# Patient Record
Sex: Female | Born: 1995 | Hispanic: Yes | Marital: Married | Smoking: Never smoker
Health system: Southern US, Community
[De-identification: ages and names within clinical notes are randomized; demographics above are authoritative.]

## PROBLEM LIST (undated history)

## (undated) ENCOUNTER — Inpatient Hospital Stay (HOSPITAL_COMMUNITY): Payer: Self-pay

## (undated) DIAGNOSIS — K8020 Calculus of gallbladder without cholecystitis without obstruction: Secondary | ICD-10-CM

## (undated) DIAGNOSIS — G43909 Migraine, unspecified, not intractable, without status migrainosus: Secondary | ICD-10-CM

## (undated) DIAGNOSIS — Z789 Other specified health status: Secondary | ICD-10-CM

## (undated) HISTORY — PX: CHOLECYSTECTOMY: SHX55

## (undated) HISTORY — PX: NO PAST SURGERIES: SHX2092

---
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
## 2013-04-02 ENCOUNTER — Emergency Department (HOSPITAL_COMMUNITY)
Admission: EM | Admit: 2013-04-02 | Discharge: 2013-04-02 | Disposition: A | Discharge status: Home / Self Care | Payer: Medicaid Other | Attending: Emergency Medicine | Admitting: Emergency Medicine

## 2013-04-02 ENCOUNTER — Encounter (HOSPITAL_COMMUNITY): Payer: Self-pay | Admitting: Emergency Medicine

## 2013-04-02 DIAGNOSIS — H6690 Otitis media, unspecified, unspecified ear: Secondary | ICD-10-CM | POA: Insufficient documentation

## 2013-04-02 DIAGNOSIS — H6691 Otitis media, unspecified, right ear: Secondary | ICD-10-CM

## 2013-04-02 DIAGNOSIS — R509 Fever, unspecified: Secondary | ICD-10-CM | POA: Insufficient documentation

## 2013-04-02 MED ORDER — AMOXICILLIN 500 MG PO CAPS: 500.0000 mg | ORAL_CAPSULE | Freq: Three times a day (TID) | ORAL | Status: DC

## 2013-04-02 MED ORDER — ACETAMINOPHEN 500 MG PO TABS: 500.0000 mg | ORAL_TABLET | Freq: Four times a day (QID) | ORAL | Status: DC | PRN

## 2013-04-02 NOTE — ED Provider Notes (Signed)
Medical screening examination/treatment/procedure(s) were performed by non-physician practitioner and as supervising physician I was immediately available for consultation/collaboration.  EKG Interpretation   None         Zeniyah Peaster T Sydnie Sigmund, MD 04/02/13 2034 

## 2013-04-02 NOTE — ED Notes (Signed)
Rt ear pain x 3 days had a fever

## 2013-04-02 NOTE — Discharge Instructions (Signed)
Take amoxicillin as directed until gone. Take tylenol as needed for pain. Refer to attached documents for more information. Return to the ED with worsening or concerning symptoms.  °

## 2013-04-02 NOTE — ED Provider Notes (Signed)
CSN: [id number]     Arrival date & time 04/02/13  1443 History  This chart was scribed for non-physician practitioner Emilia BeckKaitlyn Clester Chlebowski, PA-C working with Audree CamelScott T Goldston, MD by Danella Maiersaroline Early, ED Scribe. This patient was seen in room TR08C/TR08C and the patient's care was started at 3:11 PM.   Chief Complaint  Patient presents with  . Otalgia   The history is provided by the patient. No language interpreter was used.   HPI Comments: Lisa Herrera is a 18 y.o. female who presents to the Emergency Department complaining of right otalgia onset 3 days ago. The pain is aching and severe and does not radiate. She reports she started having a fever last night although she did not take her temperature. She has not tried any OTC medications. She reports one of her cousin is sick.   History reviewed. No pertinent past medical history. History reviewed. No pertinent past surgical history. No family history on file. History  Substance Use Topics  . Smoking status: Never Smoker   . Smokeless tobacco: Not on file  . Alcohol Use: No   OB History   Grav Para Term Preterm Abortions TAB SAB Ect Mult Living                 Review of Systems  Constitutional: Positive for fever.  HENT: Positive for ear pain.   All other systems reviewed and are negative.    Allergies  Review of patient's allergies indicates no known allergies.  Home Medications  No current outpatient prescriptions on file. BP 121/75  Pulse 66  Temp(Src) 98.1 F (36.7 C) (Oral)  Resp 18  SpO2 100% Physical Exam  Nursing note and vitals reviewed. Constitutional: She is oriented to person, place, and time. She appears well-developed and well-nourished. No distress.  HENT:  Head: Normocephalic and atraumatic.  Right Ear: Ear canal normal.  Left Ear: Tympanic membrane, external ear and ear canal normal.  Right ear: external ear canal erythematous. Tenderness with palpation of the mastoid and retraction of the auricle.    Eyes: EOM are normal.  Neck: Neck supple. No tracheal deviation present.  Cardiovascular: Normal rate.   Pulmonary/Chest: Effort normal. No respiratory distress.  Musculoskeletal: Normal range of motion.  Neurological: She is alert and oriented to person, place, and time.  Skin: Skin is warm and dry.  Psychiatric: She has a normal mood and affect. Her behavior is normal.    ED Course  Procedures (including critical care time) Medications - No data to display  DIAGNOSTIC STUDIES: Oxygen Saturation is 100% on RA, normal by my interpretation.    COORDINATION OF CARE: 4:18 PM- Discussed treatment plan with pt which includes discharge home with antibiotics. Pt agrees to plan.    Labs Review Labs Reviewed - No data to display Imaging Review No results found.  EKG Interpretation   None       MDM   1. Otitis media, right     5:41 PM Patient likely has otitis media. Patient will be discharged with amoxicillin and tylenol. Vitals stable and patient afebrile. Patient instructed to return with worsening or concerning symptoms.   I personally performed the services described in this documentation, which was scribed in my presence. The recorded information has been reviewed and is accurate.    Emilia BeckKaitlyn Janazia Schreier, PA-C 04/02/13 1742

## 2013-07-15 ENCOUNTER — Encounter (HOSPITAL_COMMUNITY): Payer: Self-pay | Admitting: Emergency Medicine

## 2013-07-15 ENCOUNTER — Emergency Department (HOSPITAL_COMMUNITY)
Admission: EM | Admit: 2013-07-15 | Discharge: 2013-07-16 | Disposition: A | Discharge status: Home / Self Care | Payer: Medicaid Other | Attending: Emergency Medicine | Admitting: Emergency Medicine

## 2013-07-15 DIAGNOSIS — R197 Diarrhea, unspecified: Secondary | ICD-10-CM | POA: Insufficient documentation

## 2013-07-15 DIAGNOSIS — R1010 Upper abdominal pain, unspecified: Secondary | ICD-10-CM

## 2013-07-15 DIAGNOSIS — R1013 Epigastric pain: Secondary | ICD-10-CM | POA: Insufficient documentation

## 2013-07-15 DIAGNOSIS — R1012 Left upper quadrant pain: Secondary | ICD-10-CM | POA: Insufficient documentation

## 2013-07-15 LAB — CBC WITH DIFFERENTIAL/PLATELET
BASOS PCT: 0 % (ref 0–1)
Basophils Absolute: 0 10*3/uL (ref 0.0–0.1)
EOS ABS: 0.1 10*3/uL (ref 0.0–0.7)
Eosinophils Relative: 1 % (ref 0–5)
HEMATOCRIT: 40.5 % (ref 36.0–46.0)
HEMOGLOBIN: 13 g/dL (ref 12.0–15.0)
LYMPHS ABS: 2.5 10*3/uL (ref 0.7–4.0)
Lymphocytes Relative: 27 % (ref 12–46)
MCH: 28.9 pg (ref 26.0–34.0)
MCHC: 32.1 g/dL (ref 30.0–36.0)
MCV: 90 fL (ref 78.0–100.0)
MONO ABS: 0.5 10*3/uL (ref 0.1–1.0)
MONOS PCT: 5 % (ref 3–12)
NEUTROS PCT: 68 % (ref 43–77)
Neutro Abs: 6.4 10*3/uL (ref 1.7–7.7)
Platelets: 278 10*3/uL (ref 150–400)
RBC: 4.5 MIL/uL (ref 3.87–5.11)
RDW: 12.8 % (ref 11.5–15.5)
WBC: 9.4 10*3/uL (ref 4.0–10.5)

## 2013-07-15 NOTE — ED Provider Notes (Signed)
CSN: [id number]     Arrival date & time 07/15/13  2114 History   First MD Initiated Contact with Patient 07/15/13 2303     Chief Complaint  Patient presents with  . Abdominal Pain     (Consider location/radiation/quality/duration/timing/severity/associated sxs/prior Treatment) HPI 18 year old female presents to emergency department via EMS from home with complaint of 2 days of upper abdominal pain, and one week of loose stools.  Patient reports diarrhea, has been 4-5 loose stools a day.  She has had some mild cramping associated with it.  No blood or mucus noted in stools.  No nausea or vomiting.  Patient finished her last menstrual cycle today.  No sick contacts, no travel, no unusual foods.  Patient reports she was diagnosed with gastritis about 6 months ago, when she had an episode of nausea, vomiting, and diarrhea.  She does not remember what medications she was prescribed at that time.  Patient denies any medical history.  She has not taken any over-the-counter medications prior to arrival.  No fevers or chills.  Patient presents tonight as pain got worse. History reviewed. No pertinent past medical history. History reviewed. No pertinent past surgical history. History reviewed. No pertinent family history. History  Substance Use Topics  . Smoking status: Never Smoker   . Smokeless tobacco: Not on file  . Alcohol Use: No   OB History   Grav Para Term Preterm Abortions TAB SAB Ect Mult Living                 Review of Systems  See History of Present Illness; otherwise all other systems are reviewed and negative   Allergies  Review of patient's allergies indicates no known allergies.  Home Medications   Prior to Admission medications   Not on File   BP 137/69  Pulse 75  Temp(Src) 97.8 F (36.6 C) (Oral)  Resp 16  Ht 5' (1.524 m)  Wt 145 lb 12.8 oz (66.134 kg)  BMI 28.47 kg/m2  SpO2 100%  LMP 07/14/2013 Physical Exam  Nursing note and vitals  reviewed. Constitutional: She is oriented to person, place, and time. She appears well-developed and well-nourished. No distress (tearful).  HENT:  Head: Normocephalic and atraumatic.  Nose: Nose normal.  Mouth/Throat: Oropharynx is clear and moist.  Eyes: Conjunctivae and EOM are normal. Pupils are equal, round, and reactive to light.  Neck: Normal range of motion. Neck supple. No JVD present. No tracheal deviation present. No thyromegaly present.  Cardiovascular: Normal rate, regular rhythm, normal heart sounds and intact distal pulses.  Exam reveals no gallop and no friction rub.   No murmur heard. Pulmonary/Chest: Effort normal and breath sounds normal. No stridor. No respiratory distress. She has no wheezes. She has no rales. She exhibits no tenderness.  Abdominal: Soft. She exhibits no distension and no mass. There is tenderness (patient has tenderness to palpation in the epigastrium and left upper quadrant.  No rebound or guarding.  She has hyperactive bowel sounds.). There is no rebound and no guarding.  Musculoskeletal: Normal range of motion. She exhibits no edema and no tenderness.  Lymphadenopathy:    She has no cervical adenopathy.  Neurological: She is alert and oriented to person, place, and time. She exhibits normal muscle tone. Coordination normal.  Skin: Skin is warm and dry. No rash noted. No erythema. No pallor.  Psychiatric: She has a normal mood and affect. Her behavior is normal. Judgment and thought content normal.    ED Course  Procedures (including  critical care time) Labs Review Labs Reviewed  COMPREHENSIVE METABOLIC PANEL - Abnormal; Notable for the following:    Creatinine, Ser 0.48 (*)    All other components within normal limits  CBC WITH DIFFERENTIAL  LIPASE, BLOOD  URINALYSIS, ROUTINE W REFLEX MICROSCOPIC  PREGNANCY, URINE    Imaging Review No results found.   EKG Interpretation None      MDM   Final diagnoses:  Diarrhea  Upper abdominal  pain    18 year old female with a week of diarrhea, and 2 days of upper abdominal pain.  Plan to check labs, give medications for symptoms.  Patient overall appears well, abdomen is Surgical, soft.  No signs of acute abdomen.  Vitals are reassuring.  Expect patient will be able to go home.   2:04 AM Pt wishes to go home.  Labs normal, no vomiting.  Pt feeling better.  Poor urine output, but has been having diarrhea, tolerating POs here well.  Olivia Mackielga M Jamone Garrido, MD 07/16/13 [id number]

## 2013-07-15 NOTE — ED Notes (Signed)
MD at bedside at this time.

## 2013-07-15 NOTE — ED Notes (Signed)
Bed: [facility] Expected date: 07/15/13 Expected time: 9:03 PM Means of arrival: Ambulance Comments: abd pain

## 2013-07-15 NOTE — ED Notes (Signed)
Pt BIB EMS, reports that she has been having abdominal pain x1 week with n/v/d. Reports that she has had a hx of gastritis x6 mo ago with similar symptoms. Pt a&o x4, ambulatory to triage.

## 2013-07-16 LAB — COMPREHENSIVE METABOLIC PANEL
ALK PHOS: 87 U/L (ref 39–117)
ALT: 13 U/L (ref 0–35)
AST: 16 U/L (ref 0–37)
Albumin: 4 g/dL (ref 3.5–5.2)
BILIRUBIN TOTAL: 0.3 mg/dL (ref 0.3–1.2)
BUN: 13 mg/dL (ref 6–23)
CO2: 21 mEq/L (ref 19–32)
CREATININE: 0.48 mg/dL — AB (ref 0.50–1.10)
Calcium: 9.4 mg/dL (ref 8.4–10.5)
Chloride: 103 mEq/L (ref 96–112)
GFR calc non Af Amer: >90 mL/min (ref >90)
GLUCOSE: 98 mg/dL (ref 70–99)
POTASSIUM: 3.9 meq/L (ref 3.7–5.3)
Sodium: 139 mEq/L (ref 137–147)
TOTAL PROTEIN: 7.1 g/dL (ref 6.0–8.3)

## 2013-07-16 LAB — LIPASE, BLOOD: LIPASE: 23 U/L (ref 11–59)

## 2013-07-16 MED ORDER — ONDANSETRON 8 MG PO TBDP
8.0000 mg | ORAL_TABLET | Freq: Once | ORAL | Status: AC
  Administered 2013-07-16: 8 mg via ORAL
  Filled 2013-07-16: qty 1

## 2013-07-16 MED ORDER — FAMOTIDINE 20 MG PO TABS
40.0000 mg | ORAL_TABLET | Freq: Once | ORAL | Status: AC
  Administered 2013-07-16: 40 mg via ORAL
  Filled 2013-07-16: qty 2

## 2013-07-16 MED ORDER — ONDANSETRON 8 MG PO TBDP
8.0000 mg | ORAL_TABLET | Freq: Three times a day (TID) | ORAL | Status: DC | PRN
Start: 2013-07-16 — End: 2015-05-05

## 2013-07-16 MED ORDER — DICYCLOMINE HCL 10 MG/ML IM SOLN
20.0000 mg | Freq: Once | INTRAMUSCULAR | Status: AC
  Administered 2013-07-16: 20 mg via INTRAMUSCULAR
  Filled 2013-07-16: qty 2

## 2013-07-16 MED ORDER — FAMOTIDINE 20 MG PO TABS: 20.0000 mg | ORAL_TABLET | Freq: Two times a day (BID) | ORAL | Status: DC

## 2013-07-16 MED ORDER — GI COCKTAIL ~~LOC~~
30.0000 mL | Freq: Once | ORAL | Status: AC
  Administered 2013-07-16: 30 mL via ORAL
  Filled 2013-07-16: qty 30

## 2013-07-16 MED ORDER — DICYCLOMINE HCL 20 MG PO TABS: 20.0000 mg | ORAL_TABLET | Freq: Four times a day (QID) | ORAL | Status: DC | PRN

## 2013-07-16 NOTE — Discharge Instructions (Signed)
Abdominal Pain, Adult °Many things can cause abdominal pain. Usually, abdominal pain is not caused by a disease and will improve without treatment. It can often be observed and treated at home. Your health care provider will do a physical exam and possibly order blood tests and X-rays to help determine the seriousness of your pain. However, in many cases, more time must pass before a clear cause of the pain can be found. Before that point, your health care provider may not know if you need more testing or further treatment. °HOME CARE INSTRUCTIONS  °Monitor your abdominal pain for any changes. The following actions may help to alleviate any discomfort you are experiencing: °· Only take over-the-counter or prescription medicines as directed by your health care provider. °· Do not take laxatives unless directed to do so by your health care provider. °· Try a clear liquid diet (broth, tea, or water) as directed by your health care provider. Slowly move to a bland diet as tolerated. °SEEK MEDICAL CARE IF: °· You have unexplained abdominal pain. °· You have abdominal pain associated with nausea or diarrhea. °· You have pain when you urinate or have a bowel movement. °· You experience abdominal pain that wakes you in the night. °· You have abdominal pain that is worsened or improved by eating food. °· You have abdominal pain that is worsened with eating fatty foods. °SEEK IMMEDIATE MEDICAL CARE IF:  °· Your pain does not go away within 2 hours. °· You have a fever. °· You keep throwing up (vomiting). °· Your pain is felt only in portions of the abdomen, such as the right side or the left lower portion of the abdomen. °· You pass bloody or black tarry stools. °MAKE SURE YOU: °· Understand these instructions.   °· Will watch your condition.   °· Will get help right away if you are not doing well or get worse.   °Document Released: 12/20/2004 Document Revised: 12/31/2012 Document Reviewed: 11/19/2012 °ExitCare® Patient  Information ©2014 [employer]. ° °Diarrhea °Diarrhea is frequent loose and watery bowel movements. It can cause you to feel weak and dehydrated. Dehydration can cause you to become tired and thirsty, have a dry mouth, and have decreased urination that often is dark yellow. Diarrhea is a sign of another problem, most often an infection that will not last long. In most cases, diarrhea typically lasts 2 3 days. However, it can last longer if it is a sign of something more serious. It is important to treat your diarrhea as directed by your caregive to lessen or prevent future episodes of diarrhea. °CAUSES  °Some common causes include: °· Gastrointestinal infections caused by viruses, bacteria, or parasites. °· Food poisoning or food allergies. °· Certain medicines, such as antibiotics, chemotherapy, and laxatives. °· Artificial sweeteners and fructose. °· Digestive disorders. °HOME CARE INSTRUCTIONS °· Ensure adequate fluid intake (hydration): have 1 cup (8 oz) of fluid for each diarrhea episode. Avoid fluids that contain simple sugars or sports drinks, fruit juices, whole milk products, and sodas. Your urine should be clear or pale yellow if you are drinking enough fluids. Hydrate with an oral rehydration solution that you can purchase at pharmacies, retail stores, and online. You can prepare an oral rehydration solution at home by mixing the following ingredients together: °·   tsp table salt. °· ¾ tsp baking soda. °·  tsp salt substitute containing potassium chloride. °· 1  tablespoons sugar. °· 1 L (34 oz) of water. °· Certain foods and beverages may increase the   speed at which food moves through the gastrointestinal (GI) tract. These foods and beverages should be avoided and include:  Caffeinated and alcoholic beverages.  High-fiber foods, such as raw fruits and vegetables, nuts, seeds, and whole grain breads and cereals.  Foods and beverages sweetened with sugar alcohols, such as xylitol, sorbitol, and  mannitol.  Some foods may be well tolerated and may help thicken stool including:  Starchy foods, such as rice, toast, pasta, low-sugar cereal, oatmeal, grits, baked potatoes, crackers, and bagels.  Bananas.  Applesauce.  Add probiotic-rich foods to help increase healthy bacteria in the GI tract, such as yogurt and fermented milk products.  Wash your hands well after each diarrhea episode.  Only take over-the-counter or prescription medicines as directed by your caregiver.  Take a warm bath to relieve any burning or pain from frequent diarrhea episodes. SEEK IMMEDIATE MEDICAL CARE IF:   You are unable to keep fluids down.  You have persistent vomiting.  You have blood in your stool, or your stools are black and tarry.  You do not urinate in 6 8 hours, or there is only a small amount of very dark urine.  You have abdominal pain that increases or localizes.  You have weakness, dizziness, confusion, or lightheadedness.  You have a severe headache.  Your diarrhea gets worse or does not get better.  You have a fever or persistent symptoms for more than 2 3 days.  You have a fever and your symptoms suddenly get worse. MAKE SURE YOU:   Understand these instructions.  Will watch your condition.  Will get help right away if you are not doing well or get worse. Document Released: 03/02/2002 Document Revised: 02/27/2012 Document Reviewed: 11/18/2011 [facility] PsExitCare Patient Information 2014 [place], [place].  Diet for Diarrhea, Adult Frequent, runny stools (diarrhea) may be caused or worsened by food or drink. Diarrhea may be relieved by changing your diet. Since diarrhea can last up to 7 days, it is easy for you to lose too much fluid from the body and become dehydrated. Fluids that are lost need to be replaced. Along with a modified diet, make sure you drink enough fluids to keep your urine clear or pale yellow. DIET INSTRUCTIONS  Ensure adequate fluid intake (hydration): have 1 cup  (8 oz) of fluid for each diarrhea episode. Avoid fluids that contain simple sugars or sports drinks, fruit juices, whole milk products, and sodas. Your urine should be clear or pale yellow if you are drinking enough fluids. Hydrate with an oral rehydration solution that you can purchase at pharmacies, retail stores, and online. You can prepare an oral rehydration solution at home by mixing the following ingredients together:    tsp table salt.   tsp baking soda.   tsp salt substitute containing potassium chloride.  1  tablespoons sugar.  1 L (34 oz) of water.  Certain foods and beverages may increase the speed at which food moves through the gastrointestinal (GI) tract. These foods and beverages should be avoided and include:  Caffeinated and alcoholic beverages.  High-fiber foods, such as raw fruits and vegetables, nuts, seeds, and whole grain breads and cereals.  Foods and beverages sweetened with sugar alcohols, such as xylitol, sorbitol, and mannitol.  Some foods may be well tolerated and may help thicken stool including:  Starchy foods, such as rice, toast, pasta, low-sugar cereal, oatmeal, grits, baked potatoes, crackers, and bagels.   Bananas.   Applesauce.  Add probiotic-rich foods to help increase healthy bacteria in the GI  tract, such as yogurt and fermented milk products. °RECOMMENDED FOODS AND BEVERAGES °Starches °Choose foods with less than 2 g of fiber per serving. °· Recommended:  White, French, and pita breads, plain rolls, buns, bagels. Plain muffins, matzo. Soda, saltine, or graham crackers. Pretzels, melba toast, zwieback. Cooked cereals made with water: cornmeal, farina, cream cereals. Dry cereals: refined corn, wheat, rice. Potatoes prepared any way without skins, refined macaroni, spaghetti, noodles, refined rice. °· Avoid:  Bread, rolls, or crackers made with whole wheat, multi-grains, rye, bran seeds, nuts, or coconut. Corn tortillas or taco shells. Cereals  containing whole grains, multi-grains, bran, coconut, nuts, raisins. Cooked or dry oatmeal. Coarse wheat cereals, granola. Cereals advertised as "high-fiber." Potato skins. Whole grain pasta, wild or brown rice. Popcorn. Sweet potatoes, yams. Sweet rolls, doughnuts, waffles, pancakes, sweet breads. °Vegetables °· Recommended: Strained tomato and vegetable juices. Most well-cooked and canned vegetables without seeds. Fresh: Tender lettuce, cucumber without the skin, cabbage, spinach, bean sprouts. °· Avoid: Fresh, cooked, or canned: Artichokes, baked beans, beet greens, broccoli, Brussels sprouts, corn, kale, legumes, peas, sweet potatoes. Cooked: Green or red cabbage, spinach. Avoid large servings of any vegetables because vegetables shrink when cooked, and they contain more fiber per serving than fresh vegetables. °Fruit °· Recommended: Cooked or canned: Apricots, applesauce, cantaloupe, cherries, fruit cocktail, grapefruit, grapes, kiwi, mandarin oranges, peaches, pears, plums, watermelon. Fresh: Apples without skin, ripe banana, grapes, cantaloupe, cherries, grapefruit, peaches, oranges, plums. Keep servings limited to ½ cup or 1 piece. °· Avoid: Fresh: Apples with skin, apricots, mangoes, pears, raspberries, strawberries. Prune juice, stewed or dried prunes. Dried fruits, raisins, dates. Large servings of all fresh fruits. °Protein °· Recommended: Ground or well-cooked tender beef, ham, veal, lamb, pork, or poultry. Eggs. Fish, oysters, shrimp, lobster, other seafoods. Liver, organ meats. °· Avoid: Tough, fibrous meats with gristle. Peanut butter, smooth or chunky. Cheese, nuts, seeds, legumes, dried peas, beans, lentils. °Dairy °· Recommended: Yogurt, lactose-free milk, kefir, drinkable yogurt, buttermilk, soy milk, or plain hard cheese. °· Avoid: Milk, chocolate milk, beverages made with milk, such as milkshakes. °Soups °· Recommended: Bouillon, broth, or soups made from allowed foods. Any strained  soup. °· Avoid: Soups made from vegetables that are not allowed, cream or milk-based soups. °Desserts and Sweets °· Recommended: Sugar-free gelatin, sugar-free frozen ice pops made without sugar alcohol. °· Avoid: Plain cakes and cookies, pie made with fruit, pudding, custard, cream pie. Gelatin, fruit, ice, sherbet, frozen ice pops. Ice cream, ice milk without nuts. Plain hard candy, honey, jelly, molasses, syrup, sugar, chocolate syrup, gumdrops, marshmallows. °Fats and Oils °· Recommended: Limit fats to less than 8 tsp per day. °· Avoid: Seeds, nuts, olives, avocados. Margarine, butter, cream, mayonnaise, salad oils, plain salad dressings. Plain gravy, crisp bacon without rind. °Beverages °· Recommended: Water, decaffeinated teas, oral rehydration solutions, sugar-free beverages not sweetened with sugar alcohols. °· Avoid: Fruit juices, caffeinated beverages (coffee, tea, soda), alcohol, sports drinks, or lemon-lime soda. °Condiments °· Recommended: Ketchup, mustard, horseradish, vinegar, cocoa powder. Spices in moderation: allspice, basil, bay leaves, celery powder or leaves, cinnamon, cumin powder, curry powder, ginger, mace, marjoram, onion or garlic powder, oregano, paprika, parsley flakes, ground pepper, rosemary, sage, savory, tarragon, thyme, turmeric. °· Avoid: Coconut, honey. °Document Released: 06/02/2003 Document Revised: 12/05/2011 Document Reviewed: 07/27/2011 °ExitCare® Patient Information ©2014 [employer]. ° ° ° °

## 2013-07-16 NOTE — ED Notes (Signed)
Pt stated that she didn't have to go to the restroom at this time

## 2014-06-10 ENCOUNTER — Emergency Department (HOSPITAL_COMMUNITY)
Admission: EM | Admit: 2014-06-10 | Discharge: 2014-06-10 | Disposition: A | Discharge status: Home / Self Care | Payer: Self-pay | Source: Home / Self Care

## 2014-06-10 ENCOUNTER — Emergency Department (HOSPITAL_COMMUNITY): Admission: EM | Admit: 2014-06-10 | Discharge: 2014-06-10 | Payer: Self-pay | Source: Home / Self Care

## 2015-05-05 ENCOUNTER — Inpatient Hospital Stay (HOSPITAL_COMMUNITY): Payer: Medicaid Other

## 2015-05-05 ENCOUNTER — Inpatient Hospital Stay (HOSPITAL_COMMUNITY)
Admission: AD | Admit: 2015-05-05 | Discharge: 2015-05-05 | Disposition: A | Discharge status: Home / Self Care | Payer: Medicaid Other | Source: Ambulatory Visit | Attending: Obstetrics & Gynecology | Admitting: Obstetrics & Gynecology

## 2015-05-05 ENCOUNTER — Encounter (HOSPITAL_COMMUNITY): Payer: Self-pay

## 2015-05-05 DIAGNOSIS — Z3201 Encounter for pregnancy test, result positive: Secondary | ICD-10-CM | POA: Diagnosis not present

## 2015-05-05 DIAGNOSIS — O26899 Other specified pregnancy related conditions, unspecified trimester: Secondary | ICD-10-CM | POA: Insufficient documentation

## 2015-05-05 DIAGNOSIS — R102 Pelvic and perineal pain: Secondary | ICD-10-CM | POA: Diagnosis not present

## 2015-05-05 DIAGNOSIS — Z640 Problems related to unwanted pregnancy: Secondary | ICD-10-CM | POA: Insufficient documentation

## 2015-05-05 DIAGNOSIS — O3680X0 Pregnancy with inconclusive fetal viability, not applicable or unspecified: Secondary | ICD-10-CM | POA: Insufficient documentation

## 2015-05-05 DIAGNOSIS — R1032 Left lower quadrant pain: Secondary | ICD-10-CM

## 2015-05-05 DIAGNOSIS — O3680X1 Pregnancy with inconclusive fetal viability, fetus 1: Secondary | ICD-10-CM

## 2015-05-05 DIAGNOSIS — Z3A00 Weeks of gestation of pregnancy not specified: Secondary | ICD-10-CM | POA: Diagnosis not present

## 2015-05-05 DIAGNOSIS — Z3490 Encounter for supervision of normal pregnancy, unspecified, unspecified trimester: Secondary | ICD-10-CM

## 2015-05-05 DIAGNOSIS — R109 Unspecified abdominal pain: Secondary | ICD-10-CM

## 2015-05-05 LAB — POCT PREGNANCY, URINE: PREG TEST UR: POSITIVE — AB

## 2015-05-05 LAB — CBC
HCT: 38.7 % (ref 36.0–46.0)
Hemoglobin: 12.9 g/dL (ref 12.0–15.0)
MCH: 28.9 pg (ref 26.0–34.0)
MCHC: 33.3 g/dL (ref 30.0–36.0)
MCV: 86.8 fL (ref 78.0–100.0)
PLATELETS: 295 10*3/uL (ref 150–400)
RBC: 4.46 MIL/uL (ref 3.87–5.11)
RDW: 13.1 % (ref 11.5–15.5)
WBC: 8.1 10*3/uL (ref 4.0–10.5)

## 2015-05-05 LAB — ABO/RH: ABO/RH(D): O POS

## 2015-05-05 LAB — URINALYSIS, ROUTINE W REFLEX MICROSCOPIC
BILIRUBIN URINE: NEGATIVE
Glucose, UA: NEGATIVE mg/dL
HGB URINE DIPSTICK: NEGATIVE
Ketones, ur: 15 mg/dL — AB
Leukocytes, UA: NEGATIVE
Nitrite: NEGATIVE
Protein, ur: NEGATIVE mg/dL
pH: 6.5 (ref 5.0–8.0)

## 2015-05-05 LAB — HCG, QUANTITATIVE, PREGNANCY: HCG, BETA CHAIN, QUANT, S: 2016 m[IU]/mL — AB (ref <5)

## 2015-05-05 NOTE — Discharge Instructions (Signed)
Reposo pélvico   (Pelvic Rest)  El reposo pélvico se recomienda a las mujeres cuando:   · La placenta cubre parcial o completamente la abertura del cuello del útero (placenta previa).  · Hay sangrado entre la pared del útero y el saco amniótico en el primer trimestre (hemorragia subcoriónica).  · El cuello uterino comienza a abrirse sin iniciarse el trabajo de parto (cuello uterino incompetente, insuficiencia cervical).  · El trabajo de parto se inicia muy pronto (parto prematuro).  INSTRUCCIONES PARA EL CUIDADO EN EL HOGAR   · No tenga relaciones sexuales, estimulación, ni orgasmos.  · No use tampones, no se haga duchas vaginales ni coloque ningún objeto en la vagina.  · No levante objetos que pesen más de 10 libras (4,5 kg).  · Evite las actividades extenuantes o tensionar los músculos de la pelvis.  SOLICITE ATENCIÓN MÉDICA SI:    · Tiene un sangrado vaginal durante el embarazo. Considérelo como una posible emergencia.  · Siente cólicos en la zona baja del estómago (más fuertes que los cólicos menstruales).  · Nota flujo vaginal (acuoso, con moco o sangre).  · Siente un dolor en la espalda leve y sordo.  · Tiene contracciones regulares o endurecimiento del útero.  SOLICITE ATENCIÓN MÉDICA DE INMEDIATO SI:   Observa sangrado vaginal y tiene placenta previa.      Esta información no tiene como fin reemplazar el consejo del médico. Asegúrese de hacerle al médico cualquier pregunta que tenga.     Document Released: 12/05/2011  Elsevier Interactive Patient Education ©2016 Elsevier Inc.

## 2015-05-05 NOTE — MAU Provider Note (Signed)
History     CSN: [id number]  Arrival date and time: 05/05/15 0909   First Provider Initiated Contact with Patient 05/05/15 [phone]      Chief Complaint  Patient presents with  . Abdominal Pain   HPI   Spanish interpretor at bedside:  [name] is a 20 y.o. female presenting with left sided abdominal pain that radiates to her left lower back.   The pain started on Monday; the pain has remained the same since Monday.  She currently rates her pain 5/10  This is an unplanned pregnancy and the patient has a lot of anxiety/concerns about keeping the pregnancy due to her religion. She is not married yet; engaged. She is afraid that her family with be upset with her for conceiving out of wedlock.   OB History    Gravida Para Term Preterm AB TAB SAB Ectopic Multiple Living   1               History reviewed. No pertinent past medical history.  History reviewed. No pertinent past surgical history.  History reviewed. No pertinent family history.  Social History  Substance Use Topics  . Smoking status: Never Smoker   . Smokeless tobacco: None  . Alcohol Use: No    Allergies: No Known Allergies  Prescriptions prior to admission  Medication Sig Dispense Refill Last Dose  . dicyclomine (BENTYL) 20 MG tablet Take 1 tablet (20 mg total) by mouth every 6 (six) hours as needed for spasms (for abdominal cramping). 20 tablet 0   . famotidine (PEPCID) 20 MG tablet Take 1 tablet (20 mg total) by mouth 2 (two) times daily. 30 tablet 0   . ondansetron (ZOFRAN ODT) 8 MG disintegrating tablet Take 1 tablet (8 mg total) by mouth every 8 (eight) hours as needed for nausea or vomiting. 20 tablet 0    Results for orders placed or performed during the hospital encounter of 05/05/15 (from the past 48 hour(s))  Pregnancy, urine POC     Status: Abnormal   Collection Time: 05/05/15  9:28 AM  Result Value Ref Range   Preg Test, Ur POSITIVE (A) NEGATIVE    Comment:        THE SENSITIVITY OF  THIS METHODOLOGY IS >24 mIU/mL   CBC     Status: None   Collection Time: 05/05/15 10:22 AM  Result Value Ref Range   WBC 8.1 4.0 - 10.5 K/uL   RBC 4.46 3.87 - 5.11 MIL/uL   Hemoglobin 12.9 12.0 - 15.0 g/dL   HCT 11.9 14.7 - 82.9 %   MCV 86.8 78.0 - 100.0 fL   MCH 28.9 26.0 - 34.0 pg   MCHC 33.3 30.0 - 36.0 g/dL   RDW 56.2 13.0 - 86.5 %   Platelets 295 150 - 400 K/uL  ABO/Rh     Status: None   Collection Time: 05/05/15 10:22 AM  Result Value Ref Range   ABO/RH(D) O POS   hCG, quantitative, pregnancy     Status: Abnormal   Collection Time: 05/05/15 10:22 AM  Result Value Ref Range   hCG, Beta Chain, Quant, S 2016 (H) <5 mIU/mL    Comment:          GEST. AGE      CONC.  (mIU/mL)   <=1 WEEK        5 - 50     2 WEEKS       50 - 500     3 WEEKS  100 - 10,000     4 WEEKS     1,000 - 30,000     5 WEEKS     3,500 - 115,000   6-8 WEEKS     12,000 - 270,000    12 WEEKS     15,000 - 220,000        FEMALE AND NON-PREGNANT FEMALE:     LESS THAN 5 mIU/mL   Urinalysis, Routine w reflex microscopic (not at [facility])     Status: Abnormal   Collection Time: 05/05/15 10:30 AM  Result Value Ref Range   Color, Urine YELLOW YELLOW   APPearance CLEAR CLEAR   Specific Gravity, Urine <1.005 (L) 1.005 - 1.030   pH 6.5 5.0 - 8.0   Glucose, UA NEGATIVE NEGATIVE mg/dL   Hgb urine dipstick NEGATIVE NEGATIVE   Bilirubin Urine NEGATIVE NEGATIVE   Ketones, ur 15 (A) NEGATIVE mg/dL   Protein, ur NEGATIVE NEGATIVE mg/dL   Nitrite NEGATIVE NEGATIVE   Leukocytes, UA NEGATIVE NEGATIVE    Comment: MICROSCOPIC NOT DONE ON URINES WITH NEGATIVE PROTEIN, BLOOD, LEUKOCYTES, NITRITE, OR GLUCOSE <1000 mg/dL.    US Ob Comp Less 14 Wks  05/05/2015  CLINICAL DATA:  Right pelvic pain x4 days, pregnant EXAM: OBSTETRIC <14 WK [place] AND TRANSVAGINAL OB US TECHNIQUE: Both transabdominal and transvaginal ultrasound examinations were performed for complete evaluation of the gestation as well as the maternal uterus, adnexal  regions, and pelvic cul-de-sac. Transvaginal technique was performed to assess early pregnancy. COMPARISON:  None. FINDINGS: Intrauterine gestational sac: Not visualized Maternal uterus/adnexae: Endometrial complex measures 2.3 cm. Right ovary measures 3.3 x 2.4 x 2.5 cm and is notable for a corpus luteal cyst. Left ovary measures 2.2 x 1.1 x 1.4 cm. IMPRESSION: No IUP is visualized. By definition, this reflects a pregnancy of unknown location. Differential considerations include early normal IUP, abnormal IUP/missed abortion, or nonvisualized ectopic pregnancy. Serial beta HCG is suggested, supplemented by repeat pelvic ultrasound in 14 days (or earlier as clinically warranted). Electronically Signed   By: Charline Bills M.D.   On: 05/05/2015 11:53   US Ob Transvaginal  05/05/2015  CLINICAL DATA:  Right pelvic pain x4 days, pregnant EXAM: OBSTETRIC <14 WK [place] AND TRANSVAGINAL OB US TECHNIQUE: Both transabdominal and transvaginal ultrasound examinations were performed for complete evaluation of the gestation as well as the maternal uterus, adnexal regions, and pelvic cul-de-sac. Transvaginal technique was performed to assess early pregnancy. COMPARISON:  None. FINDINGS: Intrauterine gestational sac: Not visualized Maternal uterus/adnexae: Endometrial complex measures 2.3 cm. Right ovary measures 3.3 x 2.4 x 2.5 cm and is notable for a corpus luteal cyst. Left ovary measures 2.2 x 1.1 x 1.4 cm. IMPRESSION: No IUP is visualized. By definition, this reflects a pregnancy of unknown location. Differential considerations include early normal IUP, abnormal IUP/missed abortion, or nonvisualized ectopic pregnancy. Serial beta HCG is suggested, supplemented by repeat pelvic ultrasound in 14 days (or earlier as clinically warranted). Electronically Signed   By: Charline Bills M.D.   On: 05/05/2015 11:53    Review of Systems  Constitutional: Negative for fever.  Genitourinary: Positive for dysuria, urgency,  frequency and hematuria.   Physical Exam   Blood pressure 128/64, pulse 108, temperature 98.2 F (36.8 C), temperature source Oral, resp. rate 16, last menstrual period 04/01/2015.  Physical Exam  Constitutional: She is oriented to person, place, and time. She appears well-developed and well-nourished. No distress.  GI: There is tenderness in the suprapubic area and left lower quadrant. There  is rigidity and rebound.  Genitourinary:  Cervix closed Uterus non tender, normal size Generalized tenderness in bilateral adnexa and suprapubic area.  Chaperone present for exam.  Musculoskeletal: Normal range of motion.  Neurological: She is alert and oriented to person, place, and time.  Skin: Skin is warm. She is not diaphoretic.  Psychiatric: She is withdrawn. She exhibits a depressed mood.    MAU Course  Procedures  None  MDM  Discussed in detail with the spanish interpretor at the bedside my concern for possible ectopic; abdominal pain>elevated quant> NO IUP on [place].  Patient and family agreeable to bring the patient back if pain worsens  Assessment and Plan   A:   1. Pregnancy of unknown anatomic location   2. Abdominal pain in pregnancy, antepartum   3. Unplanned pregnancy, unknown if wanted    P:  Discharge home in stable condition  Strict ectopic precautions Return to MAU if symptoms worsen Pelvic rest Return to MAU on Saturday for repeat quant (48 hours) Support offered    Duane Lope, NP 05/05/2015 1:25 PM

## 2015-05-05 NOTE — MAU Note (Signed)
Patient presents with complaints of abdominal pain that started yesterday.

## 2015-05-06 LAB — HIV ANTIBODY (ROUTINE TESTING W REFLEX): HIV Screen 4th Generation wRfx: NONREACTIVE

## 2015-05-07 ENCOUNTER — Inpatient Hospital Stay (HOSPITAL_COMMUNITY)
Admission: AD | Admit: 2015-05-07 | Discharge: 2015-05-07 | Disposition: A | Discharge status: Home / Self Care | Payer: Medicaid Other | Source: Ambulatory Visit | Attending: Family Medicine | Admitting: Family Medicine

## 2015-05-07 ENCOUNTER — Inpatient Hospital Stay (HOSPITAL_COMMUNITY): Payer: Medicaid Other

## 2015-05-07 ENCOUNTER — Encounter (HOSPITAL_COMMUNITY): Payer: Self-pay | Admitting: Student

## 2015-05-07 DIAGNOSIS — M545 Low back pain: Secondary | ICD-10-CM | POA: Insufficient documentation

## 2015-05-07 DIAGNOSIS — Z3A01 Less than 8 weeks gestation of pregnancy: Secondary | ICD-10-CM | POA: Diagnosis not present

## 2015-05-07 DIAGNOSIS — O3680X0 Pregnancy with inconclusive fetal viability, not applicable or unspecified: Secondary | ICD-10-CM

## 2015-05-07 DIAGNOSIS — R109 Unspecified abdominal pain: Secondary | ICD-10-CM | POA: Diagnosis not present

## 2015-05-07 DIAGNOSIS — O9989 Other specified diseases and conditions complicating pregnancy, childbirth and the puerperium: Secondary | ICD-10-CM

## 2015-05-07 DIAGNOSIS — R102 Pelvic and perineal pain: Secondary | ICD-10-CM

## 2015-05-07 DIAGNOSIS — O26891 Other specified pregnancy related conditions, first trimester: Secondary | ICD-10-CM | POA: Diagnosis present

## 2015-05-07 HISTORY — DX: Other specified health status: Z78.9

## 2015-05-07 LAB — HCG, QUANTITATIVE, PREGNANCY: hCG, Beta Chain, Quant, S: 4095 m[IU]/mL — ABNORMAL HIGH (ref <5)

## 2015-05-07 NOTE — MAU Provider Note (Signed)
History   [id number]   Chief Complaint  Patient presents with  . Follow-up    BHCG    HPI Lisa Herrera is a 20 y.o. female G1P0 here for follow-up BHCG.  Upon review of the records patient was first seen on 2/9 for abdominal pain. BHCG on that day was 2016. Ultrasound showed no iup. Pt discharged home.   Pt here today with no report of abdominal pain or vaginal bleeding. Does report right lower back pain.   Patient's last menstrual period was 04/01/2015.  OB History  Gravida Para Term Preterm AB SAB TAB Ectopic Multiple Living  1             # Outcome Date GA Lbr Len/2nd Weight Sex Delivery Anes PTL Lv  1 Current               Past Medical History  Diagnosis Date  . Medical history non-contributory     History reviewed. No pertinent family history.  Social History   Social History  . Marital Status: Single    Spouse Name: N/A  . Number of Children: N/A  . Years of Education: N/A   Social History Main Topics  . Smoking status: Never Smoker   . Smokeless tobacco: Not on file  . Alcohol Use: No  . Drug Use: Not on file  . Sexual Activity: Not on file   Other Topics Concern  . Not on file   Social History Narrative    No Known Allergies  No current facility-administered medications on file prior to encounter.   No current outpatient prescriptions on file prior to encounter.     Physical Exam   Filed Vitals:   05/07/15 0849  BP: 114/63  Pulse: 84  Temp: 98 F (36.7 C)  TempSrc: Oral  Resp: 18    Physical Exam  Constitutional: She appears well-developed and well-nourished. No distress.  HENT:  Head: Normocephalic and atraumatic.  Cardiovascular: Normal rate.   Respiratory: Effort normal. No respiratory distress.  GI: Soft. There is no tenderness. There is no CVA tenderness.  Musculoskeletal: Normal range of motion.  Skin: She is not diaphoretic.  Psychiatric: She has a normal mood and affect. Her behavior is normal. Judgment and thought  content normal.    MAU Course  Procedures Component     Latest Ref Rng 05/05/2015 05/07/2015  HCG, Beta Chain, Quant, S     <5 mIU/mL 2016 (H) 4095 (H)   US Ob Transvaginal  05/07/2015  CLINICAL DATA:  20 year old pregnant female with pelvic pain. No IUP identified on ultrasound 2 days ago. Beta HCG of 4,095. EXAM: OBSTETRIC <14 WK [place] AND TRANSVAGINAL OB US TECHNIQUE: Both transabdominal and transvaginal ultrasound examinations were performed for complete evaluation of the gestation as well as the maternal uterus, adnexal regions, and pelvic cul-de-sac. Transvaginal technique was performed to assess early pregnancy. COMPARISON:  05/05/2015 FINDINGS: Intrauterine gestational sac: An intrauterine cystic structure is now identified measuring 4 mm likely represents intrauterine gestational sac. Yolk sac:  Not visualized Embryo:  Not visualized MSD: 4.3  mm   5 w   1  d                [place] EDC: 01/06/2016 Maternal uterus/adnexae: There is no evidence of subchorionic hemorrhage. The right ovary is unremarkable. The left ovary is not well visualized. There is no evidence of free fluid or adnexal mass. IMPRESSION: Single cystic structure within the uterus, probably an early intrauterine gestational sac, but  no yolk sac, fetal pole, or cardiac activity yet visualized. Recommend follow-up quantitative B-HCG levels and follow-up US in 14 days to confirm and assess viability. This recommendation follows SRU consensus guidelines: Diagnostic Criteria for Nonviable Pregnancy Early in the First Trimester. Malva Limes Med 2013; 811:9147-82. Left ovary not visualized. Electronically Signed   By: Harmon Pier M.D.   On: 05/07/2015 10:26    MDM Appropriate rise in BHCG. Will order ultrasound today since no IUGS/IUP seen the other day with a BHCG of >2000.  Denies abdominal pain or vaginal bleeding; no tenderness to palpation. Some right lower back pain.  Ultrasound shows probable IUGS, no yolk sac. Will have patient f/u in  clinic for BHCG on Monday.   Assessment and Plan  20 y.o. G1P0 at [redacted]w[redacted]d wks Pregnancy Follow-up BHCG Pregnancy of Unknown Location  P: Discharge home Return to Clinic Monday morning for [facility] Ectopic precautions  Judeth Horn, NP 05/07/2015 9:49 AM

## 2015-05-07 NOTE — MAU Note (Signed)
Pt here for F/U BHCG, has pain in R lower back with sitting, no abd pain, denies bleeding.

## 2015-05-07 NOTE — Discharge Instructions (Signed)
Dolor abdominal en el embarazo  (Abdominal Pain During Pregnancy)  El dolor abdominal es frecuente durante el embarazo. Generalmente no causa ningún daño. El dolor abdominal puede tener numerosas causas. Algunas causas son más graves que otras. Ciertas causas de dolor abdominal durante el embarazo se diagnostican fácilmente. A veces, se tarda un tiempo para llegar al diagnóstico. Otras veces la causa no se conoce. El dolor abdominal puede estar relacionado con alguna alteración del embarazo, o puede deberse a una causa totalmente diferente. Por este motivo, siempre consulte a su médico cuando sienta molestias abdominales.  INSTRUCCIONES PARA EL CUIDADO EN EL HOGAR   Esté atenta al dolor para ver si hay cambios. Las siguientes indicaciones ayudarán a aliviar cualquier molestia que pueda sentir:  · No tenga relaciones sexuales y no coloque nada dentro de la vagina hasta que los síntomas hayan desaparecido completamente.  · Descanse todo lo que pueda hasta que el dolor se le haya calmado.  · Si siente náuseas, beba líquidos claros. Evite los alimentos sólidos mientras sienta malestar o tenga náuseas.  · Tome sólo medicamentos de venta libre o recetados, según las indicaciones del médico.  · Cumpla con todas las visitas de control, según le indique su médico.  SOLICITE ATENCIÓN MÉDICA DE INMEDIATO SI:  · Tiene un sangrado, pérdida de líquidos o elimina tejidos por la vagina.  · El dolor o los cólicos aumentan.  · Tiene vómitos persistentes.  · Comienza a sentir dolor al orinar u observa sangre.  · Tiene fiebre.  · Nota que los movimientos del bebé disminuyen.  · Siente intensa debilidad o se marea.  · Tiene dificultad para respirar con o sin dolor abdominal.  · Siente un dolor de cabeza intenso junto al dolor abdominal.  · Tiene una secreción vaginal anormal con dolor abdominal.  · Tiene diarrea persistente.  · El dolor abdominal sigue o empeora aún después de hacer reposo.  ASEGÚRESE DE QUE:   · Comprende estas  instrucciones.  · Controlará su afección.  · Recibirá ayuda de inmediato si no mejora o si empeora.     Esta información no tiene como fin reemplazar el consejo del médico. Asegúrese de hacerle al médico cualquier pregunta que tenga.     Document Released: 03/12/2005 Document Revised: 12/31/2012  Elsevier Interactive Patient Education ©2016 Elsevier Inc.

## 2015-05-09 ENCOUNTER — Telehealth: Payer: Self-pay

## 2015-05-09 ENCOUNTER — Other Ambulatory Visit (INDEPENDENT_AMBULATORY_CARE_PROVIDER_SITE_OTHER): Payer: Self-pay | Admitting: Obstetrics & Gynecology

## 2015-05-09 DIAGNOSIS — O3680X0 Pregnancy with inconclusive fetal viability, not applicable or unspecified: Secondary | ICD-10-CM

## 2015-05-09 DIAGNOSIS — Z3201 Encounter for pregnancy test, result positive: Secondary | ICD-10-CM

## 2015-05-09 LAB — HCG, QUANTITATIVE, PREGNANCY: hCG, Beta Chain, Quant, S: 7097 m[IU]/mL — ABNORMAL HIGH (ref <5)

## 2015-05-09 NOTE — Progress Notes (Signed)
     [name]  is a 20 y.o. G1P0 at [redacted]w[redacted]d who presents to the [facility] today for follow-up quant hCG after 48 hours. The patient was seen in MAU on 05/05/15 and had quant hCG of 2016 and US showed no pregnancy in uterus or adnexa.  Repeat visit on 05/07/15 showed a BHCG of 4095 and u/s showed IUGS.  Today, she denies pain, vaginal bleeding or fever today.   OB History  Gravida Para Term Preterm AB SAB TAB Ectopic Multiple Living  1             # Outcome Date GA Lbr Len/2nd Weight Sex Delivery Anes PTL Lv  1 Current               Past Medical History  Diagnosis Date  . Medical history non-contributory      LMP 04/01/2015  CONSTITUTIONAL: Well-developed, well-nourished female in no acute distress.  ENT: External right and left ear normal.  EYES: EOM intact, conjunctivae normal.  MUSCULOSKELETAL: Normal range of motion.  CARDIOVASCULAR: Regular heart rate RESPIRATORY: Normal effort NEUROLOGICAL: Alert and oriented to person, place, and time.  SKIN: Skin is warm and dry. No rash noted. Not diaphoretic. No erythema. No pallor. PSYCH: Normal mood and affect. Normal behavior. Normal judgment and thought content.  Results for orders placed or performed in visit on 05/09/15 (from the past 24 hour(s))  hCG, quantitative, pregnancy     Status: Abnormal   Collection Time: 05/09/15 11:16 AM  Result Value Ref Range   hCG, Beta Chain, Quant, S 7097 (H) <5 mIU/mL    A: Appropriate rise in quant hCG after 48 hours  P: Patient reassured. Discharge home First trimester precautions discussed Patient will return for follow-up US in 1 week. Order placed and RN to schedule. Patient will return to [facility] for results following [place].  Patient may return to MAU as needed or if her condition were to change or worsen   Tereso Newcomer, MD 05/09/2015 1:51 PM

## 2015-05-09 NOTE — Telephone Encounter (Signed)
Pt has been drawn for a stat

## 2015-05-18 ENCOUNTER — Encounter: Payer: Self-pay | Admitting: Advanced Practice Midwife

## 2015-05-18 ENCOUNTER — Ambulatory Visit (INDEPENDENT_AMBULATORY_CARE_PROVIDER_SITE_OTHER): Payer: Self-pay | Admitting: Advanced Practice Midwife

## 2015-05-18 ENCOUNTER — Ambulatory Visit (HOSPITAL_COMMUNITY)
Admission: RE | Admit: 2015-05-18 | Discharge: 2015-05-18 | Disposition: A | Discharge status: Home / Self Care | Payer: Medicaid Other | Source: Ambulatory Visit | Attending: Obstetrics & Gynecology | Admitting: Obstetrics & Gynecology

## 2015-05-18 ENCOUNTER — Other Ambulatory Visit: Payer: Self-pay | Admitting: General Practice

## 2015-05-18 DIAGNOSIS — Z3A08 8 weeks gestation of pregnancy: Secondary | ICD-10-CM | POA: Diagnosis not present

## 2015-05-18 DIAGNOSIS — O3680X0 Pregnancy with inconclusive fetal viability, not applicable or unspecified: Secondary | ICD-10-CM

## 2015-05-18 DIAGNOSIS — R109 Unspecified abdominal pain: Secondary | ICD-10-CM

## 2015-05-18 DIAGNOSIS — O26899 Other specified pregnancy related conditions, unspecified trimester: Secondary | ICD-10-CM | POA: Insufficient documentation

## 2015-05-18 DIAGNOSIS — O209 Hemorrhage in early pregnancy, unspecified: Secondary | ICD-10-CM | POA: Diagnosis not present

## 2015-05-18 DIAGNOSIS — O9989 Other specified diseases and conditions complicating pregnancy, childbirth and the puerperium: Secondary | ICD-10-CM

## 2015-05-18 NOTE — Progress Notes (Signed)
Ultrasounds Results Note  SUBJECTIVE HPI:  [name] is a 20 y.o. G1P0 at [redacted]w[redacted]d by LMP who presents to the [facility] for followup ultrasound results. The patient denies abdominal pain or vaginal bleeding.  Upon review of the patient's records, patient was first seen in MAU on 05/05/15 for pain.   BHCG on that day was   Ref. Range 05/07/2015 08:30  HCG, Beta Chain, Quant, S Latest Ref Range: <5 mIU/mL 4095 (H)   .  Ultrasound showed no gestational sac.  Last seen in MAU on 05/07/15. BHCG was 7097.  Repeat ultrasound was performed earlier today.   Past Medical History  Diagnosis Date  . Medical history non-contributory    Past Surgical History  Procedure Laterality Date  . No past surgeries     Social History   Social History  . Marital Status: Single    Spouse Name: N/A  . Number of Children: N/A  . Years of Education: N/A   Occupational History  . Not on file.   Social History Main Topics  . Smoking status: Never Smoker   . Smokeless tobacco: Not on file  . Alcohol Use: No  . Drug Use: Not on file  . Sexual Activity: Yes   Other Topics Concern  . Not on file   Social History Narrative   No current outpatient prescriptions on file prior to visit.   No current facility-administered medications on file prior to visit.   No Known Allergies  I have reviewed patient's Past Medical Hx, Surgical Hx, Family Hx, Social Hx, medications and allergies.   Review of Systems Review of Systems  Constitutional: Negative for fever and chills.  Gastrointestinal: Negative for nausea, vomiting, abdominal pain, diarrhea and constipation.  Genitourinary: Negative for dysuria.  Musculoskeletal: Negative for back pain.  Neurological: Negative for dizziness and weakness.    Physical Exam  LMP 04/01/2015  GENERAL: Well-developed, well-nourished female in no acute distress.  HEENT: Normocephalic, atraumatic.   LUNGS: Effort normal ABDOMEN: soft, non-tender HEART:  Regular rate  SKIN: Warm, dry and without erythema PSYCH: Normal mood and affect NEURO: Alert and oriented x 4  LAB RESULTS No results found for this or any previous visit (from the past 24 hour(s)).  IMAGING US Ob Comp Less 14 Wks  05/05/2015  CLINICAL DATA:  Right pelvic pain x4 days, pregnant EXAM: OBSTETRIC <14 WK [place] AND TRANSVAGINAL OB US TECHNIQUE: Both transabdominal and transvaginal ultrasound examinations were performed for complete evaluation of the gestation as well as the maternal uterus, adnexal regions, and pelvic cul-de-sac. Transvaginal technique was performed to assess early pregnancy. COMPARISON:  None. FINDINGS: Intrauterine gestational sac: Not visualized Maternal uterus/adnexae: Endometrial complex measures 2.3 cm. Right ovary measures 3.3 x 2.4 x 2.5 cm and is notable for a corpus luteal cyst. Left ovary measures 2.2 x 1.1 x 1.4 cm. IMPRESSION: No IUP is visualized. By definition, this reflects a pregnancy of unknown location. Differential considerations include early normal IUP, abnormal IUP/missed abortion, or nonvisualized ectopic pregnancy. Serial beta HCG is suggested, supplemented by repeat pelvic ultrasound in 14 days (or earlier as clinically warranted). Electronically Signed   By: Charline Bills M.D.   On: 05/05/2015 11:53   US Ob Transvaginal  05/18/2015  CLINICAL DATA:  Inconclusive viability EXAM: TRANSVAGINAL OB ULTRASOUND TECHNIQUE: Transvaginal ultrasound was performed for complete evaluation of the gestation as well as the maternal uterus, adnexal regions, and pelvic cul-de-sac. COMPARISON:  05/07/2015 FINDINGS: Intrauterine gestational sac: Visualized/normal in shape. Yolk sac:  Visualized Embryo:  Visualized Cardiac Activity: Visualized Heart Rate: 122 bpm MSD:   mm    w     d CRL:   5.8  mm   6 w 3 d                  [place] EDC: 01/08/2016 Subchorionic hemorrhage:  Moderate sized subchorionic hemorrhage Maternal uterus/adnexae: No adnexal masses or free fluid.  IMPRESSION: 6 week 3 day intrauterine pregnancy. Fetal heart rate 122 beats per minute. Moderate-sized subchorionic hemorrhage. Electronically Signed   By: Charlett Nose M.D.   On: 05/18/2015 10:13   US Ob Transvaginal  05/07/2015  CLINICAL DATA:  20 year old pregnant female with pelvic pain. No IUP identified on ultrasound 2 days ago. Beta HCG of 4,095. EXAM: OBSTETRIC <14 WK [place] AND TRANSVAGINAL OB US TECHNIQUE: Both transabdominal and transvaginal ultrasound examinations were performed for complete evaluation of the gestation as well as the maternal uterus, adnexal regions, and pelvic cul-de-sac. Transvaginal technique was performed to assess early pregnancy. COMPARISON:  05/05/2015 FINDINGS: Intrauterine gestational sac: An intrauterine cystic structure is now identified measuring 4 mm likely represents intrauterine gestational sac. Yolk sac:  Not visualized Embryo:  Not visualized MSD: 4.3  mm   5 w   1  d                [place] EDC: 01/06/2016 Maternal uterus/adnexae: There is no evidence of subchorionic hemorrhage. The right ovary is unremarkable. The left ovary is not well visualized. There is no evidence of free fluid or adnexal mass. IMPRESSION: Single cystic structure within the uterus, probably an early intrauterine gestational sac, but no yolk sac, fetal pole, or cardiac activity yet visualized. Recommend follow-up quantitative B-HCG levels and follow-up US in 14 days to confirm and assess viability. This recommendation follows SRU consensus guidelines: Diagnostic Criteria for Nonviable Pregnancy Early in the First Trimester. Malva Limes Med 2013; 098:1191-47. Left ovary not visualized. Electronically Signed   By: Harmon Pier M.D.   On: 05/07/2015 10:26   US Ob Transvaginal  05/05/2015  CLINICAL DATA:  Right pelvic pain x4 days, pregnant EXAM: OBSTETRIC <14 WK [place] AND TRANSVAGINAL OB US TECHNIQUE: Both transabdominal and transvaginal ultrasound examinations were performed for complete evaluation of the  gestation as well as the maternal uterus, adnexal regions, and pelvic cul-de-sac. Transvaginal technique was performed to assess early pregnancy. COMPARISON:  None. FINDINGS: Intrauterine gestational sac: Not visualized Maternal uterus/adnexae: Endometrial complex measures 2.3 cm. Right ovary measures 3.3 x 2.4 x 2.5 cm and is notable for a corpus luteal cyst. Left ovary measures 2.2 x 1.1 x 1.4 cm. IMPRESSION: No IUP is visualized. By definition, this reflects a pregnancy of unknown location. Differential considerations include early normal IUP, abnormal IUP/missed abortion, or nonvisualized ectopic pregnancy. Serial beta HCG is suggested, supplemented by repeat pelvic ultrasound in 14 days (or earlier as clinically warranted). Electronically Signed   By: Charline Bills M.D.   On: 05/05/2015 11:53    ASSESSMENT 1. Abdominal pain affecting pregnancy, antepartum     PLAN Discharge home in stable condition Patient advised to start/continue taking prenatal vitamins Pregnancy confirmation letter given Patient advised to start prenatal care with [facility] provider of choice as soon as possible  Aviva Signs, CNM  05/18/2015  11:31 AM

## 2015-05-18 NOTE — Patient Instructions (Signed)
First Trimester of Pregnancy The first trimester of pregnancy is from week 1 until the end of week 12 (months 1 through 3). A week after a sperm fertilizes an egg, the egg will implant on the wall of the uterus. This embryo will begin to develop into a baby. Genes from you and your partner are forming the baby. The female genes determine whether the baby is a boy or a girl. At 6-8 weeks, the eyes and face are formed, and the heartbeat can be seen on ultrasound. At the end of 12 weeks, all the baby's organs are formed.  Now that you are pregnant, you will want to do everything you can to have a healthy baby. Two of the most important things are to get good prenatal care and to follow your health care provider's instructions. Prenatal care is all the medical care you receive before the baby's birth. This care will help prevent, find, and treat any problems during the pregnancy and childbirth. BODY CHANGES Your body goes through many changes during pregnancy. The changes vary from woman to woman.   You may gain or lose a couple of pounds at first.  You may feel sick to your stomach (nauseous) and throw up (vomit). If the vomiting is uncontrollable, call your health care provider.  You may tire easily.  You may develop headaches that can be relieved by medicines approved by your health care provider.  You may urinate more often. Painful urination may mean you have a bladder infection.  You may develop heartburn as a result of your pregnancy.  You may develop constipation because certain hormones are causing the muscles that push waste through your intestines to slow down.  You may develop hemorrhoids or swollen, bulging veins (varicose veins).  Your breasts may begin to grow larger and become tender. Your nipples may stick out more, and the tissue that surrounds them (areola) may become darker.  Your gums may bleed and may be sensitive to brushing and flossing.  Dark spots or blotches (chloasma,  mask of pregnancy) may develop on your face. This will likely fade after the baby is born.  Your menstrual periods will stop.  You may have a loss of appetite.  You may develop cravings for certain kinds of food.  You may have changes in your emotions from day to day, such as being excited to be pregnant or being concerned that something may go wrong with the pregnancy and baby.  You may have more vivid and strange dreams.  You may have changes in your hair. These can include thickening of your hair, rapid growth, and changes in texture. Some women also have hair loss during or after pregnancy, or hair that feels dry or thin. Your hair will most likely return to normal after your baby is born. WHAT TO EXPECT AT YOUR PRENATAL VISITS During a routine prenatal visit:  You will be weighed to make sure you and the baby are growing normally.  Your blood pressure will be taken.  Your abdomen will be measured to track your baby's growth.  The fetal heartbeat will be listened to starting around week 10 or 12 of your pregnancy.  Test results from any previous visits will be discussed. Your health care provider may ask you:  How you are feeling.  If you are feeling the baby move.  If you have had any abnormal symptoms, such as leaking fluid, bleeding, severe headaches, or abdominal cramping.  If you are using any tobacco products,   including cigarettes, chewing tobacco, and electronic cigarettes.  If you have any questions. Other tests that may be performed during your first trimester include:  Blood tests to find your blood type and to check for the presence of any previous infections. They will also be used to check for low iron levels (anemia) and Rh antibodies. Later in the pregnancy, blood tests for diabetes will be done along with other tests if problems develop.  Urine tests to check for infections, diabetes, or protein in the urine.  An ultrasound to confirm the proper growth  and development of the baby.  An amniocentesis to check for possible genetic problems.  Fetal screens for spina bifida and Down syndrome.  You may need other tests to make sure you and the baby are doing well.  HIV (human immunodeficiency virus) testing. Routine prenatal testing includes screening for HIV, unless you choose not to have this test. HOME CARE INSTRUCTIONS  Medicines  Follow your health care provider's instructions regarding medicine use. Specific medicines may be either safe or unsafe to take during pregnancy.  Take your prenatal vitamins as directed.  If you develop constipation, try taking a stool softener if your health care provider approves. Diet  Eat regular, well-balanced meals. Choose a variety of foods, such as meat or vegetable-based protein, fish, milk and low-fat dairy products, vegetables, fruits, and whole grain breads and cereals. Your health care provider will help you determine the amount of weight gain that is right for you.  Avoid raw meat and uncooked cheese. These carry germs that can cause birth defects in the baby.  Eating four or five small meals rather than three large meals a day may help relieve nausea and vomiting. If you start to feel nauseous, eating a few soda crackers can be helpful. Drinking liquids between meals instead of during meals also seems to help nausea and vomiting.  If you develop constipation, eat more high-fiber foods, such as fresh vegetables or fruit and whole grains. Drink enough fluids to keep your urine clear or pale yellow. Activity and Exercise  Exercise only as directed by your health care provider. Exercising will help you:  Control your weight.  Stay in shape.  Be prepared for labor and delivery.  Experiencing pain or cramping in the lower abdomen or low back is a good sign that you should stop exercising. Check with your health care provider before continuing normal exercises.  Try to avoid standing for long  periods of time. Move your legs often if you must stand in one place for a long time.  Avoid heavy lifting.  Wear low-heeled shoes, and practice good posture.  You may continue to have sex unless your health care provider directs you otherwise. Relief of Pain or Discomfort  Wear a good support bra for breast tenderness.   Take warm sitz baths to soothe any pain or discomfort caused by hemorrhoids. Use hemorrhoid cream if your health care provider approves.   Rest with your legs elevated if you have leg cramps or low back pain.  If you develop varicose veins in your legs, wear support hose. Elevate your feet for 15 minutes, 3-4 times a day. Limit salt in your diet. Prenatal Care  Schedule your prenatal visits by the twelfth week of pregnancy. They are usually scheduled monthly at first, then more often in the last 2 months before delivery.  Write down your questions. Take them to your prenatal visits.  Keep all your prenatal visits as directed by your   health care provider. Safety  Wear your seat belt at all times when driving.  Make a list of emergency phone numbers, including numbers for family, friends, the hospital, and police and fire departments. General Tips  Ask your health care provider for a referral to a local prenatal education class. Begin classes no later than at the beginning of month 6 of your pregnancy.  Ask for help if you have counseling or nutritional needs during pregnancy. Your health care provider can offer advice or refer you to specialists for help with various needs.  Do not use hot tubs, steam rooms, or saunas.  Do not douche or use tampons or scented sanitary pads.  Do not cross your legs for long periods of time.  Avoid cat litter boxes and soil used by cats. These carry germs that can cause birth defects in the baby and possibly loss of the fetus by miscarriage or stillbirth.  Avoid all smoking, herbs, alcohol, and medicines not prescribed by  your health care provider. Chemicals in these affect the formation and growth of the baby.  Do not use any tobacco products, including cigarettes, chewing tobacco, and electronic cigarettes. If you need help quitting, ask your health care provider. You may receive counseling support and other resources to help you quit.  Schedule a dentist appointment. At home, brush your teeth with a soft toothbrush and be gentle when you floss. SEEK MEDICAL CARE IF:   You have dizziness.  You have mild pelvic cramps, pelvic pressure, or nagging pain in the abdominal area.  You have persistent nausea, vomiting, or diarrhea.  You have a bad smelling vaginal discharge.  You have pain with urination.  You notice increased swelling in your face, hands, legs, or ankles. SEEK IMMEDIATE MEDICAL CARE IF:   You have a fever.  You are leaking fluid from your vagina.  You have spotting or bleeding from your vagina.  You have severe abdominal cramping or pain.  You have rapid weight gain or loss.  You vomit blood or material that looks like coffee grounds.  You are exposed to German measles and have never had them.  You are exposed to fifth disease or chickenpox.  You develop a severe headache.  You have shortness of breath.  You have any kind of trauma, such as from a fall or a car accident.   This information is not intended to replace advice given to you by your health care provider. Make sure you discuss any questions you have with your health care provider.   Document Released: 03/06/2001 Document Revised: 04/02/2014 Document Reviewed: 01/20/2013 Elsevier Interactive Patient Education 2016 [employer] Inc[employer]  

## 2015-05-29 ENCOUNTER — Inpatient Hospital Stay (HOSPITAL_COMMUNITY)
Admission: AD | Admit: 2015-05-29 | Discharge: 2015-05-29 | Disposition: A | Discharge status: Home / Self Care | Payer: Medicaid Other | Source: Ambulatory Visit | Attending: Obstetrics & Gynecology | Admitting: Obstetrics & Gynecology

## 2015-05-29 ENCOUNTER — Encounter (HOSPITAL_COMMUNITY): Payer: Self-pay

## 2015-05-29 DIAGNOSIS — Z3A08 8 weeks gestation of pregnancy: Secondary | ICD-10-CM | POA: Insufficient documentation

## 2015-05-29 DIAGNOSIS — O9989 Other specified diseases and conditions complicating pregnancy, childbirth and the puerperium: Secondary | ICD-10-CM

## 2015-05-29 DIAGNOSIS — R109 Unspecified abdominal pain: Secondary | ICD-10-CM | POA: Insufficient documentation

## 2015-05-29 DIAGNOSIS — O26891 Other specified pregnancy related conditions, first trimester: Secondary | ICD-10-CM | POA: Insufficient documentation

## 2015-05-29 DIAGNOSIS — O26899 Other specified pregnancy related conditions, unspecified trimester: Secondary | ICD-10-CM

## 2015-05-29 LAB — URINALYSIS, ROUTINE W REFLEX MICROSCOPIC
BILIRUBIN URINE: NEGATIVE
GLUCOSE, UA: NEGATIVE mg/dL
KETONES UR: 15 mg/dL — AB
LEUKOCYTES UA: NEGATIVE
NITRITE: NEGATIVE
PROTEIN: NEGATIVE mg/dL
Specific Gravity, Urine: >1.03 — ABNORMAL HIGH (ref 1.005–1.030)
pH: 5.5 (ref 5.0–8.0)

## 2015-05-29 LAB — URINE MICROSCOPIC-ADD ON

## 2015-05-29 MED ORDER — ACETAMINOPHEN 500 MG PO TABS
1000.0000 mg | ORAL_TABLET | Freq: Once | ORAL | Status: AC
  Administered 2015-05-29: 1000 mg via ORAL
  Filled 2015-05-29: qty 2

## 2015-05-29 MED ORDER — PRENATAL VITAMINS 0.8 MG PO TABS: 1.0000 | ORAL_TABLET | Freq: Every day | ORAL | Status: DC

## 2015-05-29 NOTE — Progress Notes (Signed)
Assisted  RN with interpretation of medications administered.  Spanish Interpreter

## 2015-05-29 NOTE — Discharge Instructions (Signed)
Primer trimestre de embarazo  (First Trimester of Pregnancy)  El primer trimestre de embarazo se extiende desde la semana 1 hasta el final de la semana 12 (mes 1 al mes 3). Una semana después de que un espermatozoide fecunda un óvulo, este se implantará en la pared uterina. Este embrión comenzará a desarrollarse hasta convertirse en un bebé. Sus genes y los de su pareja forman el bebé. Los genes del varón determinan si será un niño o una niña. Entre la semana 6 y la 8, se forman los ojos y el rostro, y los latidos del corazón pueden verse en la ecografía. Al final de las 12 semanas, todos los órganos del bebé están formados.   Ahora que está embarazada, querrá hacer todo lo que esté a su alcance para tener un bebé sano. Dos de las cosas más importantes son tener una buena atención prenatal y seguir las indicaciones del médico. La atención prenatal incluye toda la asistencia médica que usted recibe antes del nacimiento del bebé. Esta ayudará a prevenir, detectar y tratar cualquier problema durante el embarazo y el parto.  CAMBIOS EN EL ORGANISMO  Su organismo atraviesa por muchos cambios durante el embarazo, y estos varían de una mujer a otra.   · Al principio, puede aumentar o bajar algunos kilos.  · Puede tener malestar estomacal (náuseas) y vomitar. Si no puede controlar los vómitos, llame al médico.  · Puede cansarse con facilidad.  · Es posible que tenga dolores de cabeza que pueden aliviarse con los medicamentos que el médico le permita tomar.  · Puede orinar con mayor frecuencia. El dolor al orinar puede significar que usted tiene una infección de la vejiga.  · Debido al embarazo, puede tener acidez estomacal.  · Puede estar estreñida, ya que ciertas hormonas enlentecen los movimientos de los músculos que empujan los desechos a través de los intestinos.  · Pueden aparecer hemorroides o abultarse e hincharse las venas (venas varicosas).  · Las mamas pueden empezar a agrandarse y estar sensibles. Los pezones  pueden sobresalir más, y el tejido que los rodea (areola) tornarse más oscuro.  · Las encías pueden sangrar y estar sensibles al cepillado y al hilo dental.  · Pueden aparecer zonas oscuras o manchas (cloasma, máscara del embarazo) en el rostro que probablemente se atenuarán después del nacimiento del bebé.  · Los períodos menstruales se interrumpirán.  · Tal vez no tenga apetito.  · Puede sentir un fuerte deseo de consumir ciertos alimentos.  · Puede tener cambios a nivel emocional día a día, por ejemplo, por momentos puede estar emocionada por el embarazo y por otros preocuparse porque algo pueda salir mal con el embarazo o el bebé.  · Tendrá sueños más vívidos y extraños.  · Tal vez haya cambios en el cabello que pueden incluir su engrosamiento, crecimiento rápido y cambios en la textura. A algunas mujeres también se les cae el cabello durante o después del embarazo, o tienen el cabello seco o fino. Lo más probable es que el cabello se le normalice después del nacimiento del bebé.  QUÉ DEBE ESPERAR EN LAS CONSULTAS PRENATALES  Durante una visita prenatal de rutina:  · La pesarán para asegurarse de que usted y el bebé están creciendo normalmente.  · Le controlarán la presión arterial.  · Le medirán el abdomen para controlar el desarrollo del bebé.  · Se escucharán los latidos cardíacos a partir de la semana 10 o la 12 de embarazo, aproximadamente.  · Se analizarán los resultados de los estudios solicitados en visitas anteriores.  El   médico puede preguntarle:  · Cómo se siente.  · Si siente los movimientos del bebé.  · Si ha tenido síntomas anormales, como pérdida de líquido, sangrado, dolores de cabeza intensos o cólicos abdominales.  · Si está consumiendo algún producto que contenga tabaco, como cigarrillos, tabaco de mascar y cigarrillos electrónicos.  · Si tiene alguna pregunta.  Otros estudios que pueden realizarse durante el primer trimestre incluyen lo siguiente:  · Análisis de sangre para determinar el tipo  de sangre y detectar la presencia de infecciones previas. Además, se los usará para controlar si los niveles de hierro son bajos (anemia) y determinar los anticuerpos Rh. En una etapa más avanzada del embarazo, se harán análisis de sangre para saber si tiene diabetes, junto con otros estudios si surgen problemas.  · Análisis de orina para detectar infecciones, diabetes o proteínas en la orina.  · Una ecografía para confirmar que el bebé crece y se desarrolla correctamente.  · Una amniocentesis para diagnosticar posibles problemas genéticos.  · Estudios del feto para descartar espina bífida y síndrome de Down.  · Es posible que necesite otras pruebas adicionales.  · Prueba del VIH (virus de inmunodeficiencia humana). Los exámenes prenatales de rutina incluyen la prueba de detección del VIH, a menos que decida no realizársela.  INSTRUCCIONES PARA EL CUIDADO EN EL HOGAR   Medicamentos:  · Siga las indicaciones del médico en relación con el uso de medicamentos. Durante el embarazo, hay medicamentos que pueden tomarse y otros que no.  · Tome las vitaminas prenatales como se le indicó.  · Si está estreñida, tome un laxante suave, si el médico lo autoriza.  Dieta  · Consuma alimentos balanceados. Elija alimentos variados, como carne o proteínas de origen vegetal, pescado, leche y productos lácteos descremados, verduras, frutas y panes y cereales integrales. El médico la ayudará a determinar la cantidad de peso que puede aumentar.  · No coma carne cruda ni quesos sin cocinar. Estos elementos contienen bacterias que pueden causar defectos congénitos en el bebé.  · La ingesta diaria de cuatro o cinco comidas pequeñas en lugar de tres comidas abundantes puede ayudar a aliviar las náuseas y los vómitos. Si empieza a tener náuseas, comer algunas galletas saladas puede ser de ayuda. Beber líquidos entre las comidas en lugar de tomarlos durante las comidas también puede ayudar a calmar las náuseas y los vómitos.  · Si está  estreñida, consuma alimentos con alto contenido de fibra, como verduras y frutas frescas, y cereales integrales. Beba suficiente líquido para mantener la orina clara o de color amarillo pálido.  Actividad y ejercicios  · Haga ejercicio solamente como se lo haya indicado el médico. El ejercicio la ayudará a:    Controlar el peso.    Mantenerse en forma.    Estar preparada para el trabajo de parto y el parto.  · Los dolores, los cólicos en la parte baja del abdomen o los calambres en la cintura son un buen indicio de que debe dejar de hacer ejercicios. Consulte al médico antes de seguir haciendo ejercicios normales.  · Intente no estar de pie durante mucho tiempo. Mueva las piernas con frecuencia si debe estar de pie en un lugar durante mucho tiempo.  · Evite levantar pesos excesivos.  · Use zapatos de tacones bajos y mantenga una buena postura.  · Puede seguir teniendo relaciones sexuales, excepto que el médico le indique lo contrario.  Alivio del dolor o las molestias  · Use un sostén que le brinde buen   soporte si siente dolor a la palpación en las mamas.  · Dese baños de asiento con agua tibia para aliviar el dolor o las molestias causadas por las hemorroides. Use crema antihemorroidal si el médico se lo permite.  · Descanse con las piernas elevadas si tiene calambres o dolor de cintura.  · Si tiene venas varicosas en las piernas, use medias de descanso. Eleve los pies durante 15 minutos, 3 o 4 veces por día. Limite la cantidad de sal en su dieta.  Cuidados prenatales  · Programe las visitas prenatales para la semana 12 de embarazo. Generalmente se programan cada mes al principio y se hacen más frecuentes en los 2 últimos meses antes del parto.  · Escriba sus preguntas. Llévelas cuando concurra a las visitas prenatales.  · Concurra a todas las visitas prenatales como se lo haya indicado el médico.  Seguridad  · Colóquese el cinturón de seguridad cuando conduzca.  · Haga una lista de los números de teléfono de  emergencia, que incluya los números de teléfono de familiares, amigos, el hospital y los departamentos de policía y bomberos.  Consejos generales  · Pídale al médico que la derive a clases de educación prenatal en su localidad. Debe comenzar a tomar las clases antes de entrar en el mes 6 de embarazo.  · Pida ayuda si tiene necesidades nutricionales o de asesoramiento durante el embarazo. El médico puede aconsejarla o derivarla a especialistas para que la ayuden con diferentes necesidades.  · No se dé baños de inmersión en agua caliente, baños turcos ni saunas.  · No se haga duchas vaginales ni use tampones o toallas higiénicas perfumadas.  · No mantenga las piernas cruzadas durante mucho tiempo.  · Evite el contacto con las bandejas sanitarias de los gatos y la tierra que estos animales usan. Estos elementos contienen bacterias que pueden causar defectos congénitos al bebé y la posible pérdida del feto debido a un aborto espontáneo o muerte fetal.  · No fume, no consuma hierbas ni medicamentos que no hayan sido recetados por el médico. Las sustancias químicas que estos productos contienen afectan la formación y el desarrollo del bebé.  · No consuma ningún producto que contenga tabaco, lo que incluye cigarrillos, tabaco de mascar y cigarrillos electrónicos. Si necesita ayuda para dejar de fumar, consulte al médico. Puede recibir asesoramiento y otro tipo de recursos para dejar de fumar.  · Programe una cita con el dentista. En su casa, lávese los dientes con un cepillo dental blando y pásese el hilo dental con suavidad.  SOLICITE ATENCIÓN MÉDICA SI:   · Tiene mareos.  · Siente cólicos leves, presión en la pelvis o dolor persistente en el abdomen.  · Tiene náuseas, vómitos o diarrea persistentes.  · Tiene secreción vaginal con mal olor.  · Siente dolor al orinar.  · Tiene el rostro, las manos, las piernas o los tobillos más hinchados.  SOLICITE ATENCIÓN MÉDICA DE INMEDIATO SI:   · Tiene fiebre.  · Tiene una pérdida de  líquido por la vagina.  · Tiene sangrado o pequeñas pérdidas vaginales.  · Siente dolor intenso o cólicos en el abdomen.  · Sube o baja de peso rápidamente.  · Vomita sangre de color rojo brillante o material que parezca granos de café.  · Ha estado expuesta a la rubéola y no ha sufrido la enfermedad.  · Ha estado expuesta a la quinta enfermedad o a la varicela.  · Tiene un dolor de cabeza intenso.  · Le falta el aire.  · Sufre   cualquier tipo de traumatismo, por ejemplo, debido a una caída o un accidente automovilístico.     Esta información no tiene como fin reemplazar el consejo del médico. Asegúrese de hacerle al médico cualquier pregunta que tenga.     Document Released: 12/20/2004 Document Revised: 04/02/2014  Elsevier Interactive Patient Education ©2016 [employer] Inc[employer]

## 2015-05-29 NOTE — Progress Notes (Signed)
Assisted midwife with interpretation of medical questions.  Spanish Interpreter

## 2015-05-29 NOTE — MAU Note (Signed)
Pt presents complaining of lower abdominal cramping like menstrual cramps. Denies leaking or bleeding. Has not tried anything for pain.

## 2015-05-29 NOTE — Progress Notes (Signed)
Assisted RN with interpretation of patient assessment.   °Spanish Interpreter °

## 2015-05-29 NOTE — MAU Provider Note (Signed)
  History     CSN: [id number]  Arrival date and time: 05/29/15 2050   First Provider Initiated Contact with Patient 05/29/15 2157      Chief Complaint  Patient presents with  . Abdominal Pain   HPI Ms Carlynn Purlerez is a 20yo G1 @ 9825w0d by 6wk U/S who presents for eval of low abd cramping today. Denies fever, dysuria, N/V/D. Had a spot of blood on 3/3 but none since. Bld type O+.   OB History    Gravida Para Term Preterm AB TAB SAB Ectopic Multiple Living   1               Past Medical History  Diagnosis Date  . Medical history non-contributory     Past Surgical History  Procedure Laterality Date  . No past surgeries      No family history on file.  Social History  Substance Use Topics  . Smoking status: Never Smoker   . Smokeless tobacco: None  . Alcohol Use: No    Allergies: No Known Allergies  No prescriptions prior to admission    ROS Physical Exam   Blood pressure 128/62, pulse 70, temperature 98.3 F (36.8 C), temperature source Oral, resp. rate 18, last menstrual period 04/01/2015.  Physical Exam  Constitutional: She is oriented to person, place, and time. She appears well-developed.  HENT:  Head: Normocephalic.  Neck: Normal range of motion.  Cardiovascular: Normal rate.   Respiratory: Effort normal.  GI:  Soft, NT  Genitourinary: Vagina normal.  Cx closed  Musculoskeletal: Normal range of motion.  Neurological: She is alert and oriented to person, place, and time.  Skin: Skin is warm and dry.  Psychiatric: She has a normal mood and affect. Her behavior is normal. Thought content normal.   Urinalysis    Component Value Date/Time   COLORURINE YELLOW 05/29/2015 2105   APPEARANCEUR CLEAR 05/29/2015 2105   LABSPEC >1.030* 05/29/2015 2105   PHURINE 5.5 05/29/2015 2105   GLUCOSEU NEGATIVE 05/29/2015 2105   HGBUR TRACE* 05/29/2015 2105   BILIRUBINUR NEGATIVE 05/29/2015 2105   KETONESUR 15* 05/29/2015 2105   PROTEINUR NEGATIVE 05/29/2015 2105   UROBILINOGEN 0.2 12/10/2012 1520   NITRITE NEGATIVE 05/29/2015 2105   LEUKOCYTESUR NEGATIVE 05/29/2015 2105   Micro: SE 0-5, many bacteria   MAU Course  Procedures  MDM UA, GC/chlam ordered Given 1gm Tylenol for cramping- relieved her pain  Assessment and Plan  IUP@8 .0wks Abd cramping  D/C home Increase water intake [facility]  SHAW, [facility] CNM 05/29/2015, 10:07 PM

## 2015-07-01 ENCOUNTER — Ambulatory Visit (INDEPENDENT_AMBULATORY_CARE_PROVIDER_SITE_OTHER): Payer: Medicaid Other | Admitting: Certified Nurse Midwife

## 2015-07-01 ENCOUNTER — Encounter: Payer: Self-pay | Admitting: Certified Nurse Midwife

## 2015-07-01 VITALS — BP 116/76 | HR 84 | Temp 98.7°F | Wt 196.0 lb

## 2015-07-01 DIAGNOSIS — Z3402 Encounter for supervision of normal first pregnancy, second trimester: Secondary | ICD-10-CM

## 2015-07-01 DIAGNOSIS — Z3401 Encounter for supervision of normal first pregnancy, first trimester: Secondary | ICD-10-CM

## 2015-07-01 LAB — POCT URINALYSIS DIPSTICK
BILIRUBIN UA: NEGATIVE
Glucose, UA: NEGATIVE
Ketones, UA: NEGATIVE
Leukocytes, UA: NEGATIVE
NITRITE UA: NEGATIVE
PH UA: 6
Protein, UA: NEGATIVE
SPEC GRAV UA: 1.02
UROBILINOGEN UA: NEGATIVE

## 2015-07-01 MED ORDER — VITAFOL GUMMIES 3.33-0.333-34.8 MG PO CHEW: 3.0000 | CHEWABLE_TABLET | Freq: Every day | ORAL | Status: DC

## 2015-07-01 NOTE — Progress Notes (Signed)
Subjective:    Lisa Herrera is being seen today for her first obstetrical visit.  This is not a planned pregnancy. She is at 231w0d gestation. Her obstetrical history is significant for obesity. Relationship with FOB: spouse, living together. Patient does intend to breast feed. Pregnancy history fully reviewed.  Here for exam with intrepreter.    The information documented in the HPI was reviewed and verified.  Menstrual History: OB History    Gravida Para Term Preterm AB TAB SAB Ectopic Multiple Living   1               Menarche age: [age]  Patient's last menstrual period was 04/01/2015.    Past Medical History  Diagnosis Date  . Medical history non-contributory     Past Surgical History  Procedure Laterality Date  . No past surgeries       (Not in a hospital admission) No Known Allergies  Social History  Substance Use Topics  . Smoking status: Never Smoker   . Smokeless tobacco: Not on file  . Alcohol Use: No    History reviewed. No pertinent family history.   Review of Systems Constitutional: negative for weight loss Gastrointestinal: + for nausea & vomiting Genitourinary:negative for genital lesions and vaginal discharge and dysuria Musculoskeletal:negative for back pain Behavioral/Psych: negative for abusive relationship, depression, illegal drug usage and tobacco use    Objective:    BP 116/76 mmHg  Pulse 84  Temp(Src) 98.7 F (37.1 C)  Wt 196 lb (88.905 kg)  LMP 04/01/2015 General Appearance:    Alert, cooperative, no distress, appears stated age  Head:    Normocephalic, without obvious abnormality, atraumatic  Eyes:    PERRL, conjunctiva/corneas clear, EOM's intact, fundi    benign, both eyes  Ears:    Normal TM's and external ear canals, both ears  Nose:   Nares normal, septum midline, mucosa normal, no drainage    or sinus tenderness  Throat:   Lips, mucosa, and tongue normal; teeth and gums normal  Neck:   Supple, symmetrical, trachea midline, no  adenopathy;    thyroid:  no enlargement/tenderness/nodules; no carotid   bruit or JVD  Back:     Symmetric, no curvature, ROM normal, no CVA tenderness  Lungs:     Clear to auscultation bilaterally, respirations unlabored  Chest Wall:    No tenderness or deformity   Heart:    Regular rate and rhythm, S1 and S2 normal, no murmur, rub   or gallop  Breast Exam:    No tenderness, masses, or nipple abnormality  Abdomen:     Soft, non-tender, bowel sounds active all four quadrants,    no masses, no organomegaly  Genitalia:    Normal female without lesion, discharge or tenderness  Extremities:   Extremities normal, atraumatic, no cyanosis or edema  Pulses:   2+ and symmetric all extremities  Skin:   Skin color, texture, turgor normal, no rashes or lesions  Lymph nodes:   Cervical, supraclavicular, and axillary nodes normal  Neurologic:   CNII-XII intact, normal strength, sensation and reflexes    throughout       Cervix: long, thick, closed and posterior.  FHR:     Lab Review Urine pregnancy test Labs reviewed yes Radiologic studies reviewed no Assessment:    Pregnancy at 4031w0d weeks    Plan:      Prenatal vitamins.  Counseling provided regarding continued use of seat belts, cessation of alcohol consumption, smoking or use of illicit drugs; infection  precautions i.e., influenza/TDAP immunizations, toxoplasmosis,CMV, parvovirus, listeria and varicella; workplace safety, exercise during pregnancy; routine dental care, safe medications, sexual activity, hot tubs, saunas, pools, travel, caffeine use, fish and methlymercury, potential toxins, hair treatments, varicose veins Weight gain recommendations per IOM guidelines reviewed: underweight/BMI< 18.5--> gain 28 - 40 lbs; normal weight/BMI 18.5 - 24.9--> gain 25 - 35 lbs; overweight/BMI 25 - 29.9--> gain 15 - 25 lbs; obese/BMI >30->gain  11 - 20 lbs Problem list reviewed and updated. FIRST/CF mutation testing/NIPT/QUAD SCREEN/fragile  X/Ashkenazi Jewish population testing/Spinal muscular atrophy discussed: requested. Role of ultrasound in pregnancy discussed; fetal survey: requested. Amniocentesis discussed: not indicated. VBAC calculator score: VBAC consent form provided Meds ordered this encounter  Medications  . folic acid (FOLVITE) 400 MCG tablet    Sig: Take 400 mcg by mouth daily.   Orders Placed This Encounter  Procedures  . Culture, OB Urine  . Hemoglobinopathy evaluation  . Varicella zoster antibody, IgG  . VITAMIN D 25 Hydroxy (Vit-D Deficiency, Fractures)  . Prenatal Profile I  . NuSwab Vaginitis Plus (VG+)  . POCT urinalysis dipstick    Follow up in 4 weeks. 50% of 30 min visit spent on counseling and coordination of care.

## 2015-07-04 ENCOUNTER — Other Ambulatory Visit: Payer: Self-pay | Admitting: Certified Nurse Midwife

## 2015-07-04 DIAGNOSIS — Z3403 Encounter for supervision of normal first pregnancy, third trimester: Secondary | ICD-10-CM | POA: Insufficient documentation

## 2015-07-04 LAB — PRENATAL PROFILE I(LABCORP)
Antibody Screen: NEGATIVE
BASOS ABS: 0 10*3/uL (ref 0.0–0.2)
Basos: 0 %
EOS (ABSOLUTE): 0.1 10*3/uL (ref 0.0–0.4)
EOS: 1 %
HEMOGLOBIN: 12.9 g/dL (ref 11.1–15.9)
Hematocrit: 39.7 % (ref 34.0–46.6)
Hepatitis B Surface Ag: NEGATIVE
Immature Grans (Abs): 0 10*3/uL (ref 0.0–0.1)
Immature Granulocytes: 0 %
LYMPHS ABS: 2.3 10*3/uL (ref 0.7–3.1)
Lymphs: 24 %
MCH: 28.8 pg (ref 26.6–33.0)
MCHC: 32.5 g/dL (ref 31.5–35.7)
MCV: 89 fL (ref 79–97)
MONOS ABS: 0.6 10*3/uL (ref 0.1–0.9)
Monocytes: 6 %
NEUTROS ABS: 6.7 10*3/uL (ref 1.4–7.0)
Neutrophils: 69 %
PLATELETS: 299 10*3/uL (ref 150–379)
RBC: 4.48 x10E6/uL (ref 3.77–5.28)
RDW: 13.9 % (ref 12.3–15.4)
RH TYPE: POSITIVE
RPR Ser Ql: NONREACTIVE
Rubella Antibodies, IGG: 1.2 index (ref >0.99)
WBC: 9.7 10*3/uL (ref 3.4–10.8)

## 2015-07-04 LAB — HEMOGLOBINOPATHY EVALUATION
HEMOGLOBIN A2 QUANTITATION: 2.2 % (ref 0.7–3.1)
HEMOGLOBIN F QUANTITATION: 0 % (ref 0.0–2.0)
HGB A: 97.8 % (ref 94.0–98.0)
HGB C: 0 %
HGB S: 0 %

## 2015-07-04 LAB — VARICELLA ZOSTER ANTIBODY, IGG: Varicella zoster IgG: 157 index — ABNORMAL LOW (ref >165)

## 2015-07-04 LAB — VITAMIN D 25 HYDROXY (VIT D DEFICIENCY, FRACTURES): Vit D, 25-Hydroxy: 11.5 ng/mL — ABNORMAL LOW (ref 30.0–100.0)

## 2015-07-05 ENCOUNTER — Encounter: Payer: Self-pay | Admitting: *Deleted

## 2015-07-06 LAB — NUSWAB VAGINITIS PLUS (VG+)
CHLAMYDIA TRACHOMATIS, NAA: NEGATIVE
Candida albicans, NAA: NEGATIVE
Candida glabrata, NAA: NEGATIVE
Neisseria gonorrhoeae, NAA: NEGATIVE
TRICH VAG BY NAA: NEGATIVE

## 2015-07-06 LAB — PAP IG W/ RFLX HPV ASCU: PAP SMEAR COMMENT: 0

## 2015-07-07 LAB — URINE CULTURE, OB REFLEX

## 2015-07-07 LAB — CULTURE, OB URINE

## 2015-07-08 ENCOUNTER — Other Ambulatory Visit: Payer: Self-pay | Admitting: Certified Nurse Midwife

## 2015-07-15 ENCOUNTER — Encounter (HOSPITAL_COMMUNITY): Payer: Self-pay | Admitting: *Deleted

## 2015-07-15 ENCOUNTER — Inpatient Hospital Stay (HOSPITAL_COMMUNITY)
Admission: AD | Admit: 2015-07-15 | Discharge: 2015-07-15 | Disposition: A | Discharge status: Home / Self Care | Payer: Medicaid Other | Source: Ambulatory Visit | Attending: Obstetrics | Admitting: Obstetrics

## 2015-07-15 DIAGNOSIS — W109XXA Fall (on) (from) unspecified stairs and steps, initial encounter: Secondary | ICD-10-CM | POA: Insufficient documentation

## 2015-07-15 DIAGNOSIS — Z3A15 15 weeks gestation of pregnancy: Secondary | ICD-10-CM | POA: Insufficient documentation

## 2015-07-15 DIAGNOSIS — M545 Low back pain: Secondary | ICD-10-CM | POA: Diagnosis not present

## 2015-07-15 DIAGNOSIS — O9A212 Injury, poisoning and certain other consequences of external causes complicating pregnancy, second trimester: Secondary | ICD-10-CM

## 2015-07-15 DIAGNOSIS — S7001XA Contusion of right hip, initial encounter: Secondary | ICD-10-CM | POA: Diagnosis not present

## 2015-07-15 DIAGNOSIS — S3992XA Unspecified injury of lower back, initial encounter: Secondary | ICD-10-CM | POA: Diagnosis not present

## 2015-07-15 DIAGNOSIS — R109 Unspecified abdominal pain: Secondary | ICD-10-CM | POA: Diagnosis present

## 2015-07-15 DIAGNOSIS — W108XXA Fall (on) (from) other stairs and steps, initial encounter: Secondary | ICD-10-CM

## 2015-07-15 DIAGNOSIS — O26892 Other specified pregnancy related conditions, second trimester: Secondary | ICD-10-CM | POA: Insufficient documentation

## 2015-07-15 DIAGNOSIS — S79911A Unspecified injury of right hip, initial encounter: Secondary | ICD-10-CM

## 2015-07-15 DIAGNOSIS — M549 Dorsalgia, unspecified: Secondary | ICD-10-CM | POA: Diagnosis present

## 2015-07-15 LAB — URINALYSIS, ROUTINE W REFLEX MICROSCOPIC
BILIRUBIN URINE: NEGATIVE
GLUCOSE, UA: NEGATIVE mg/dL
Hgb urine dipstick: NEGATIVE
KETONES UR: NEGATIVE mg/dL
LEUKOCYTES UA: NEGATIVE
NITRITE: NEGATIVE
PH: 7.5 (ref 5.0–8.0)
PROTEIN: NEGATIVE mg/dL
Specific Gravity, Urine: 1.015 (ref 1.005–1.030)

## 2015-07-15 NOTE — Discharge Instructions (Signed)
Dolor de Doctor, general practice  (Back Pain in Pregnancy)  El dolor de espalda es habitual durante el embarazo. Ocurre en aproximadamente la mitad de todos los [place]. Es importante para usted y su beb que permanezca activa durante el [place].Si siente que Conservation officer, historic buildings de espalda es lo que no le permite mantenerse activa o dormir bien, Public affairs consultant a su mdico. La causa del dolor de espalda puede deberse a varios factores relacionados con los cambios durante el [place].Afortunadamente, excepto que haya tenido problemas de espalda antes del [place], es probable que el dolor mejore despus del [place]. El dolor lumbar por lo general ocurre entre el quinto y sptimo mes del Media planner. Sin embargo, puede ocurrir [place] primeros meses. Otros factores que aumentan el riesgo son:   Problemas previos en la espalda.  Lesiones en la espalda.  Tener gemelos o embarazos mltiples.  Tos persistente.  El estrs.  Movimientos repetitivos relacionados con Leander Rams.  Enfermedad muscular o de la columna vertebral en la espalda.  Antecedentes familiares de problemas de espalda, rotura (hernia) de discos u osteoporosis.  Depresin, ansiedad y crisis de [place]. CAUSAS   En las embarazadas, el cuerpo produce una hormona llamada relaxina. Esta hormona hace que los ligamentos que conectan la zona lumbar y los huesos del pubis sean ms flexibles. Esta flexibilidad permite que el beb nazca con ms facilidad. Cuando los ligamentos estn relajados, los msculos tienen que trabajar ms para apoyar la espalda. El dolor en la espalda puede deberse al cansancio muscular. El dolor tambin puede tener su causa en la irritacin de los tejidos de a espalda que se irritan ya que estn recibiendo menos apoyo.  A medida que el beb crece, ejerce presin Arvin[employer]itor nervios y los vasos sanguneos de la pelvis. Esto causa dolor de espalda.  A medida que el beb crece y aumenta el peso durante el [place], el  tero presiona los msculos del estmago hacia adelante y [place] su centro de gravedad. Esto hace que los msculos de la espalda deban trabajar ms para mantener una buena [place]. SNTOMAS  Dolor lumbar durante el embarazo Generalmente se produce en la zona o por arriba de la cintura en el centro de la espalda. Puede haber dolor y entumecimiento que se irradia hacia la pierna o el pie. Es similar al dolor de espalda baja experimentada por las mujeres no embarazadas. Por lo general, aumenta al [employer] de pie o sentada por largos perodos de [place], o con levantamientos repetitivos Tambin puede haber sensibilidad en los msculos en la zona superior de la espalda .  Dolor plvico posterior Agricultural consultant en la parte posterior de la pelvis es ms frecuente que el dolor lumbar en el embarazo. Se trata de un dolor profundo que se siente a un lado en la cintura, o a travs del cxis (sacro), o en ambos lugares. Puede sentir dolor en uno o ambos lados Este dolor tambin puede sentirse en las nalgas y el dorso de los muslos Tambin puede haber dolor pbico y en la ingle. El dolor no se mejora rpidamente con el reposo, y [place] puede haber rigidez matutina. Muchas actividades pueden causarlo. Un buen estado fsico antes y durante el embarazo puede o no prevenir este problema. Las contracciones del parto suelen aparecer cada 1 a 2 minutos, tienen una duracin de aproximadamente 1 minuto, e implica una sensacin de empujar o presin en la pelvis. Sin embargo, si usted est a trmino con Water quality scientist, Conservation officer, historic buildings constante  en la zona lumbar puede indicar el comienzo de un parto prematuro, y usted debe ser consciente de ello.  DIAGNSTICO  No se deben tomar radiografas de la [employer] las primeras 12 a 14 semanas del embarazo y durante el resto del Media planner, slo cuando sea absolutamente necesario. La resonancia magntica no emite radiacin y es un estudio seguro durante el Media planner. Pero tambin se  deben hacer solamente cuando sea absolutamente necesario.  INSTRUCCIONES PARA EL CUIDADO EN EL HOGAR   Realice actividad fsica segn las indicaciones del mdico. El ejercicio es la manera ms eficaz para prevenir o tratar Conservation officer, historic buildings de espalda. Si tiene un problema en la espalda, es especialmente importante evitar los deportes que requieran de movimientos corporales rpidos. La natacin y las caminatas son las mejores actividades.  No permanezca sentada o de pie en el mismo lugar durante largos perodos.  No use tacos altos.  Sintese en la silla con una buena postura. Use una almohada en su espalda baja si es necesario. Asegrese de que su cabeza descansa sobre sus hombros y no est colgando hacia delante.  Trate de dormir de lado, de preferencia el lado izquierdo, con una o [employer] Petroleum piernas. Si est dolorida despus de una noche de descanso, la cama puede ser Cendant Corporation.Trate de colocar una tabla entre el colchn y el somier.  Prstele atencin a su cuerpo cuando se levante.Si siente dolor,pida ayuda o trate de doblar las rodillas ms para American International Group de las piernas en lugar de los msculos de la espalda. Pngase en cuclillas al levantar algo del suelo. No se doble.  Consuma una dieta saludable. Trate de aumentar de peso dentro de las recomendaciones de su mdico.  Utilice compresas de calor o fro de 3 a 4 veces al da durante 15 minutos para Glass blower/designer.  Solo tome medicamentos que se pueden comprar sin receta o recetados para Conservation officer, historic buildings, Tree surgeon o fiebre, como le indica el mdico. Dolor de espaldas repentino (agudo).  Haga reposo en cama slo en caso de los episodios ms extremos y agudos de Social research officer, government. El reposo prolongado en cama de ms de 48 horas agravar su trastorno.  El hielo es muy efectivo en los problemas agudos.  Ponga el hielo en una bolsa plstica.  Colquese una toalla entre la piel y la bolsa de hielo.  Deje el hielo durante 10 a 20  minutos cada 2 horas o segn lo nesecite, mientras se encuentre despierta.  Las compresas de calor durante 30 minutos antes de las actividades tambin puede ayudar. Dolor crnico en la espalda. Consulte a su mdico si el dolor es continuo. El mdico podr ayudarla o derivarla para que realice los ejercicios y trabajos de fortalecimiento apropiados. Con un buen entrenamiento fsico, podr evitar la mayor parte de los [place]. En algunos casos, la causa es un problema ms grave. Debe ser controlada inmediatamente si aparecen nuevos problemas. El mdico tambin podr recomendar:   Una faja de maternidad.  Un arns elstico.  Un cors para la espalda.  Un masajista o acupuntura. SOLICITE ATENCIN MDICA SI:   No puede Personal assistant de sus actividades diarias, an tomando los medicamentos para Personnel officer.  Gari Crown ser derivada a un fisioterapeuta o quiroprxico.  Gari Crown intentar con acupuntura. SOLICITE ATENCIN [place] DE INMEDIATO SI:   Siente entumecimiento, hormigueo, debilidad o problemas con el uso de los brazos o las piernas.  Siente un dolor de espalda muy intenso que  no se alivia con medicamentos.  Tiene modificaciones repentinas en el control de la vejiga o el intestino.  Aumenta el dolor en otras partes del cuerpo.  Siente que le falta el aire, se marea o sufre un [place].  Tiene nuseas, vmitos o sudoracin.  Siente un dolor en la espalda similar al del [place].  Cuando aparece [employer]ra Foods, rompe la bolsa de aguas o tiene un sangrado vaginal.  El dolor o el adormecimiento se extienden hacia la pierna.  El dolor aparece despus de una cada.  Siente dolor de un solo lado. Podra tener clculos renales.  Observa sangre en la orina. Podra tener una infeccin en la vejiga o clculos renales.  Siente dolor y aparecen ronchas. Podra tener culebrilla. El dolor de espalda es bastante frecuente durante el embarazo pero no debe  aceptarse slo como parte del [place]. Siempre debe tratarse lo ms rpidamente posible. Har que su embarazo sea lo ms placentero posible.    Esta informacin no tiene Marine scientist el consejo del mdico. Asegrese de hacerle al mdico cualquier pregunta que tenga.   Document Released: 11/22/2010 Document Revised: 06/04/2011 Elsevier Interactive Patient Education 2016 [place] lesiones de la espalda (Back Injury Prevention) Las lesiones de la espalda pueden ser muy dolorosas. Adems son difciles de curar. Despus de haber tenido una lesin de la espalda, tiene ms probabilidad de lesionarse otra vez. Es importante que aprenda cmo evitar lesionarse o volver a Control and instrumentation engineer. Los siguientes consejos pueden ayudarlo a Product/process development scientist una lesin de la espalda. QU DEBO SABER SOBRE EL ESTADO FSICO?  Haga ejercicios durante 44mnutos diarios la mHartford Financialde la semana o como se lo haya indicado el mdico. AChief Strategy Officerde lo siguiente:  HSherilyn Cooterejercicios aerbicos, como caminar, trotar, andar en bicicleta o nadar.  Haga ejercicios que mejoren el equilibrio y la fuerza, cBethanytai chi y el yoga. Estos pueden reducir el riesgo de que se caiga y se lesione la espalda.  Haga ejercicios de elongacin para ayudar a la flexibilidad.  Intente desarrollar msculos abdominales fuertes. Los msculos del abdomen brindan gran parte del sostn que la espalda necesita.  Mantenga un peso saludable. Esto ayuda a reducir eCatering managerde sufrir una lesin de la espalda. QU DEBO SABER SOBRE LA DCle Elum  Hable con el mdico sobre la dieta en general. TCoronadovitaminas y los suplementos solamente como se lo haya indicado el mdico.  Hable con el mdico sobre la cantidad de calcio y vitaminaD que necesita a diario. Estos nutrientes ayudan a eInsurance risk surveyorde los huesos (osteoporosis). La osteoporosis puede derivar en fracturas seas, lo que puede causar dolor de  espalda.  Incluya buenas fuentes de calcio en la dieta, como productos lcteos, verduras de hojas verdes y productos con agregado de calcio (fortificados).  Incluya buenas fuentes de vitaminaD en la dieta, como leche y alimentos fortificados con esta vitamina. QLake Hart  Sintese y prese erguido. No se incline hacia adelante al sentarse ni se encorve al pararse.  Elija las sillas con un buen apoyo para la zona baja de la espalda (lumbar).  Si trabaja en un escritorio, sintese cerca de este para no tener que inclinarse. Mantenga el mentn hacia abajo. Mantenga el cuello hacia atrs y los codos flexionados en ngulo recto. La posicin de los brazos debe verse como la letra "L".  Cuando conduzca, sintese elevado y cerca del volante. Agregue un apoyo para la  zona lumbar al asiento del automvil, si es necesario.  No permanezca sentado ni parado en una posicin durante [employer] P[employer]. Tmese descansos para pararse, estirarse y caminar, al menos una vez por hora. Tmese descansos cada hora si conduce durante perodos largos de tiempo.  Duerma de costado con las rodillas apenas dobladas o boca arriba con una almohada debajo de las rodillas. No duerma boca abajo. [place], SOBRE LOS MOVIMIENTOS DE TORSIN Y LOS DE ESTIRAMIENTO Levantamiento de objetos y de cargas pesadas  No levante cargas pesadas y evite especialmente el levantamiento repetitivo de objetos pesados. Si debe levantar cargas pesadas:  Elongue antes de hacerlo.  Trabaje lentamente.  Descanse despus de cada levantamiento.  Use una herramienta, como un carrito o una plataforma mvil para mover los objetos, si hay una disponible.  Haga varios viajes cortos, en lugar de llevar una carga pesada.  Pida ayuda cuando la necesite, en especial cuando mueva objetos de gran tamao.  Siga estos pasos cuando levante objetos:  Prese con los pies separados al ancho de los  hombros.  Acrquese al objeto tanto como pueda. No intente levantar un objeto pesado que est lejos de su cuerpo.  Use agarraderas o correas de elevacin si estn disponibles.  [place]. [place], pero mantenga los Henry [employer]in.  Mantenga los hombros [place] atrs, el mentn hacia abajo y la espalda derecha.  Levante lentamente el objeto mientras contrae los msculos de las piernas, el abdomen y los glteos. Mantenga el objeto tan cerca del centro del cuerpo como sea posible.  Siga estos pasos cuando baje una carga pesada:  Prese con los pies separados al ancho de los hombros.  Baje lentamente el objeto mientras contrae los msculos de las piernas, el abdomen y los glteos. Mantenga el objeto tan cerca del centro del cuerpo como sea posible.  Mantenga los hombros [place] atrs, el mentn hacia abajo y la espalda derecha.  [place]. [place], pero mantenga los Henry Schein.  Use agarraderas o correas de elevacin si estn disponibles. Movimientos de torsin y de estiramiento  No levante objetos pesados por encima del nivel de la cintura.  No tuerza la cintura mientras levanta o transporta una carga. Si tiene que girar, Sears Holdings Corporation.  No se incline sin flexionar las rodillas.  No se estire para alcanzar un objeto que est por encima de su cabeza, al otro lado de una mesa o sobre una superficie elevada. [place]?  Evite los pisos mojados y los suelos helados. Retire el hielo de las aceras para evitar las cadas.  No duerma sobre un colchn muy blando ni muy duro.  Mantenga los objetos que [place] a menudo en lugares de fcil acceso.  Coloque los objetos ms pesados en estantes a nivel de la cintura y los ms livianos en estantes ms bajos o ms altos.  Encuentre formas de reducir Dealer, como hacer ejercicios, darse masajes o aplicar tcnicas de relajacin. El estrs puede H[employer]el Foods. Los msculos  en estado de tensin son ms propensos a las lesiones.  Hable con el mdico si se siente ansioso o deprimido. Estas afecciones pueden intensificar el dolor de espalda.  Use calzado sin tacones con suelas acolchonadas.  Evite los movimientos repentinos.  Use ambas correas de sujecin cuando cargue una mochila.  No consuma ningn producto que contenga tabaco, lo que incluye cigarrillos, tabaco de Higher education careers adviser o Psychologist, sport and exercise. Si necesita ayuda  para dejar de fumar, consulte al mdico.   Esta informacin no tiene Marine scientist el consejo del mdico. Asegrese de hacerle al mdico cualquier pregunta que tenga.   Document Released: 03/12/2005 Document Revised: 07/27/2014 Elsevier Interactive Patient Education [employer].
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
## 2015-07-29 ENCOUNTER — Ambulatory Visit (INDEPENDENT_AMBULATORY_CARE_PROVIDER_SITE_OTHER): Payer: Medicaid Other | Admitting: Certified Nurse Midwife

## 2015-07-29 VITALS — BP 115/68 | HR 90 | Wt 199.0 lb

## 2015-07-29 DIAGNOSIS — Z3402 Encounter for supervision of normal first pregnancy, second trimester: Secondary | ICD-10-CM

## 2015-07-29 NOTE — Progress Notes (Signed)
Patient has concern wanting U/S to know sex of baby. Thought that is the reason for visit today

## 2015-07-29 NOTE — Progress Notes (Signed)
  Subjective:    Lisa Herrera is a 20 y.o. female being seen today for her obstetrical visit. She is at 367w0d gestation. Patient reports: no complaints.  Interpreter present for exam.   Problem List Items Addressed This Visit    None    Visit Diagnoses    Supervision of normal first pregnancy in second trimester    -  Primary    Relevant Orders    US MFM OB COMP + 14 WK    AFP, Quad Screen      Patient Active Problem List   Diagnosis Date Noted  . Supervision of normal first pregnancy in first trimester 07/04/2015  . Abdominal pain affecting pregnancy, antepartum 05/18/2015    Objective:     BP 115/68 mmHg  Pulse 90  Wt 199 lb (90.266 kg)  LMP 04/01/2015 Uterine Size: Below umbilicus   FHR: 140  Assessment:    Pregnancy @ 597w0d  weeks Doing well    Plan:    Problem list reviewed and updated. Labs reviewed.  Follow up in 4 weeks. FIRST/CF mutation testing/NIPT/QUAD SCREEN/fragile X/Ashkenazi Jewish population testing/Spinal muscular atrophy discussed: ordered. Role of ultrasound in pregnancy discussed; fetal survey: ordered. Amniocentesis discussed: not indicated. 50% of 15 minute visit spent on counseling and coordination of care.

## 2015-08-03 LAB — AFP, QUAD SCREEN
DIA Mom Value: 1.12
DIA VALUE (EIA): 167.14 pg/mL
DSR (BY AGE) 1 IN: 1111
DSR (Second Trimester) 1 IN: 4683
Gestational Age: 16.7 WEEKS
MSAFP Mom: 0.89
MSAFP: 26.8 ng/mL
MSHCG MOM: 0.83
MSHCG: 24405 m[IU]/mL
Maternal Age At EDD: 22.7 YEARS
Osb Risk: 10000
TEST RESULTS AFP: NEGATIVE
Weight: 199 [lb_av]
uE3 Mom: 0.86
uE3 Value: 0.78 ng/mL

## 2015-08-10 ENCOUNTER — Ambulatory Visit (HOSPITAL_COMMUNITY)
Admission: RE | Admit: 2015-08-10 | Discharge: 2015-08-10 | Disposition: A | Discharge status: Home / Self Care | Payer: Medicaid Other | Source: Ambulatory Visit | Attending: Certified Nurse Midwife | Admitting: Certified Nurse Midwife

## 2015-08-10 ENCOUNTER — Other Ambulatory Visit: Payer: Self-pay | Admitting: Certified Nurse Midwife

## 2015-08-10 DIAGNOSIS — Z3402 Encounter for supervision of normal first pregnancy, second trimester: Secondary | ICD-10-CM

## 2015-08-10 DIAGNOSIS — Z36 Encounter for antenatal screening of mother: Secondary | ICD-10-CM | POA: Insufficient documentation

## 2015-08-10 DIAGNOSIS — O99212 Obesity complicating pregnancy, second trimester: Secondary | ICD-10-CM | POA: Diagnosis present

## 2015-08-10 DIAGNOSIS — Z3A18 18 weeks gestation of pregnancy: Secondary | ICD-10-CM | POA: Insufficient documentation

## 2015-08-26 ENCOUNTER — Ambulatory Visit (INDEPENDENT_AMBULATORY_CARE_PROVIDER_SITE_OTHER): Payer: Medicaid Other | Admitting: Certified Nurse Midwife

## 2015-08-26 VITALS — BP 127/74 | HR 100 | Temp 98.6°F | Wt 206.0 lb

## 2015-08-26 DIAGNOSIS — Z3492 Encounter for supervision of normal pregnancy, unspecified, second trimester: Secondary | ICD-10-CM

## 2015-08-26 LAB — POCT URINALYSIS DIPSTICK
Bilirubin, UA: NEGATIVE
Blood, UA: NEGATIVE
GLUCOSE UA: NEGATIVE
Ketones, UA: NEGATIVE
LEUKOCYTES UA: NEGATIVE
Nitrite, UA: NEGATIVE
Protein, UA: NEGATIVE
SPEC GRAV UA: 1.01
UROBILINOGEN UA: NEGATIVE
pH, UA: 7

## 2015-08-26 NOTE — Progress Notes (Signed)
Subjective:    Lisa Herrera is a 20 y.o. female being seen today for her obstetrical visit. She is at 5648w0d gestation. Patient reports: no complaints . Fetal movement: normal.  Increased water intake discussed.  States that she craves water, desires blood sugar testing.    Problem List Items Addressed This Visit    None    Visit Diagnoses    Prenatal care, second trimester    -  Primary    Relevant Orders    POCT urinalysis dipstick (Completed)    HgB A1c      Patient Active Problem List   Diagnosis Date Noted  . Supervision of normal first pregnancy in first trimester 07/04/2015  . Abdominal pain affecting pregnancy, antepartum 05/18/2015   Objective:    BP 127/74 mmHg  Pulse 100  Temp(Src) 98.6 F (37 C)  Wt 206 lb (93.441 kg)  LMP 04/01/2015 FHT: 145 BPM  Uterine Size: size equals dates     Assessment:    Pregnancy @ 8448w0d    Doing well  Plan:    OBGCT: discussed. Signs and symptoms of preterm labor: discussed.  Labs, problem list reviewed and updated 2 hr GTT planned Follow up in 4 weeks.

## 2015-08-27 LAB — HEMOGLOBIN A1C
Est. average glucose Bld gHb Est-mCnc: 117 mg/dL
Hgb A1c MFr Bld: 5.7 % — ABNORMAL HIGH (ref 4.8–5.6)

## 2015-08-31 ENCOUNTER — Encounter: Payer: Self-pay | Admitting: *Deleted

## 2015-09-23 ENCOUNTER — Ambulatory Visit (INDEPENDENT_AMBULATORY_CARE_PROVIDER_SITE_OTHER): Payer: Medicaid Other | Admitting: Certified Nurse Midwife

## 2015-09-23 VITALS — BP 123/76 | HR 81 | Temp 98.8°F | Wt 212.0 lb

## 2015-09-23 DIAGNOSIS — Z3402 Encounter for supervision of normal first pregnancy, second trimester: Secondary | ICD-10-CM

## 2015-09-23 LAB — POCT URINALYSIS DIPSTICK
BILIRUBIN UA: NEGATIVE
GLUCOSE UA: 50
KETONES UA: NEGATIVE
NITRITE UA: NEGATIVE
RBC UA: NEGATIVE
Spec Grav, UA: 1.025
Urobilinogen, UA: NEGATIVE
pH, UA: 6

## 2015-09-23 NOTE — Progress Notes (Signed)
Subjective:    Lisa Herrera is a 20 y.o. female being seen today for her obstetrical visit. She is at 7836w0d gestation. Patient reports: no complaints . Fetal movement: normal.  Interpreter present for exam.   Problem List Items Addressed This Visit    None    Visit Diagnoses    Encounter for supervision of normal first pregnancy in second trimester    -  Primary    Relevant Orders    POCT urinalysis dipstick (Completed)    US MFM OB FOLLOW UP      Patient Active Problem List   Diagnosis Date Noted  . Supervision of normal first pregnancy in first trimester 07/04/2015  . Abdominal pain affecting pregnancy, antepartum 05/18/2015   Objective:    BP 123/76 mmHg  Pulse 81  Temp(Src) 98.8 F (37.1 C)  Wt 212 lb (96.163 kg)  LMP 04/01/2015 FHT: 140 BPM  Uterine Size: size equals dates     Assessment:    Pregnancy @ 7136w0d    Doing well  Plan:    OBGCT: discussed and ordered for next visit. Signs and symptoms of preterm labor: discussed.  Labs, problem list reviewed and updated 2 hr GTT planned Follow up in 3 weeks.

## 2015-09-27 ENCOUNTER — Encounter (HOSPITAL_COMMUNITY): Payer: Self-pay

## 2015-09-27 ENCOUNTER — Inpatient Hospital Stay (HOSPITAL_COMMUNITY)
Admission: AD | Admit: 2015-09-27 | Discharge: 2015-09-27 | Disposition: A | Discharge status: Home / Self Care | Payer: Medicaid Other | Source: Ambulatory Visit | Attending: Obstetrics | Admitting: Obstetrics

## 2015-09-27 DIAGNOSIS — R12 Heartburn: Secondary | ICD-10-CM | POA: Diagnosis not present

## 2015-09-27 DIAGNOSIS — R109 Unspecified abdominal pain: Secondary | ICD-10-CM | POA: Diagnosis not present

## 2015-09-27 DIAGNOSIS — O26892 Other specified pregnancy related conditions, second trimester: Secondary | ICD-10-CM | POA: Insufficient documentation

## 2015-09-27 DIAGNOSIS — Z3A25 25 weeks gestation of pregnancy: Secondary | ICD-10-CM | POA: Diagnosis not present

## 2015-09-27 DIAGNOSIS — R1010 Upper abdominal pain, unspecified: Secondary | ICD-10-CM | POA: Diagnosis present

## 2015-09-27 DIAGNOSIS — O26899 Other specified pregnancy related conditions, unspecified trimester: Secondary | ICD-10-CM

## 2015-09-27 MED ORDER — RANITIDINE HCL 150 MG PO CAPS: 150.0000 mg | ORAL_CAPSULE | Freq: Two times a day (BID) | ORAL | Status: DC | PRN

## 2015-09-27 MED ORDER — GI COCKTAIL ~~LOC~~
30.0000 mL | Freq: Once | ORAL | Status: AC
  Administered 2015-09-27: 30 mL via ORAL
  Filled 2015-09-27: qty 30

## 2015-09-27 NOTE — MAU Provider Note (Signed)
Chief Complaint:  No chief complaint on file.   First Provider Initiated Contact with Patient 09/27/15 2046      HPI: Lisa JacobsonYvette Herrera is a 20 y.o. G1P0 at 7525w4dwho presents to maternity admissions reporting severe constant mid/upper abdominal pain with onset 25 minutes before arrival in MAU. She has not tried anything for her pain and reports it is worsening since it started.  She reports eating spicy tamales immediately prior to onset of the pain.  Her pregnancy has been uncomplicated. She reports good fetal movement, denies LOF, vaginal bleeding, vaginal itching/burning, urinary symptoms, h/a, dizziness, n/v, or fever/chills.    HPI  Past Medical History: Past Medical History  Diagnosis Date  . Medical history non-contributory     Past obstetric history: OB History  Gravida Para Term Preterm AB SAB TAB Ectopic Multiple Living  1             # Outcome Date GA Lbr Len/2nd Weight Sex Delivery Anes PTL Lv  1 Current               Past Surgical History: Past Surgical History  Procedure Laterality Date  . No past surgeries      Family History: History reviewed. No pertinent family history.  Social History: Social History  Substance Use Topics  . Smoking status: Never Smoker   . Smokeless tobacco: None  . Alcohol Use: No    Allergies: No Known Allergies  Meds:  Prescriptions prior to admission  Medication Sig Dispense Refill Last Dose  . Prenatal Vit-Fe Phos-FA-Omega (VITAFOL GUMMIES) 3.33-0.333-34.8 MG CHEW Chew 3 tablets by mouth daily. 90 tablet 12 09/27/2015 at Unknown time    ROS:  Review of Systems  Constitutional: Negative for fever, chills and fatigue.  Respiratory: Negative for shortness of breath.   Cardiovascular: Negative for chest pain.  Gastrointestinal: Positive for abdominal pain. Negative for nausea and vomiting.  Genitourinary: Negative for dysuria, flank pain, vaginal bleeding, vaginal discharge, difficulty urinating, vaginal pain and pelvic pain.   Neurological: Negative for dizziness and headaches.  Psychiatric/Behavioral: Negative.      I have reviewed patient's Past Medical Hx, Surgical Hx, Family Hx, Social Hx, medications and allergies.   Physical Exam  Patient Vitals for the past 24 hrs:  BP Temp Temp src Pulse Resp SpO2  09/27/15 2041 120/73 mmHg 98.5 F (36.9 C) Oral 96 20 100 %   Constitutional: Well-developed, well-nourished female in no acute distress.  Cardiovascular: normal rate Respiratory: normal effort GI: Abd soft, non-tender, gravid appropriate for gestational age.  MS: Extremities nontender, no edema, normal ROM Neurologic: Alert and oriented x 4.  GU: Neg CVAT.  PELVIC EXAM: Cervix pink, visually closed, without lesion, scant white creamy discharge, vaginal walls and external genitalia normal Bimanual exam: Cervix 0/long/high, firm, anterior, neg CMT, uterus nontender, nonenlarged, adnexa without tenderness, enlargement, or mass  Dilation: Closed Effacement (%): Thick Exam by:: Sharen CounterLisa Leftwich Kirby CNM  FHT:  Baseline 135 , moderate variability, accelerations present, no decelerations Contractions: None on toco or to palpation   Labs: No results found for this or any previous visit (from the past 24 hour(s)). O/Positive/-- (04/07 1615)  Imaging:  No results found.  MAU Course/MDM: Pt arrived in MAU in significant distress bent over and holding her abdomen.  Called to bedside immediately by RNs and FFN collected prior to cervical exam. FFN not sent to lab since no evidence of PTL.  Further assessment revealed pt having mostly upper abdominal pain and reported eating spicy  food immediately prior to onset of pain.  GI Cocktail given with complete resolution of symptoms.  Rx for Zantac 150 mg BID PRN.  Discussed dietary changes for acid reflux with pt.  Pt stable at time of discharge.  Assessment: 1. Heartburn during pregnancy in second trimester, antepartum   2. Abdominal pain affecting pregnancy,  antepartum     Plan: Discharge home Labor precautions and fetal kick counts  Follow-up Information    Follow up with Roe Coombsachelle A Denney, CNM.   Specialty:  Certified Nurse Midwife   Why:  As scheduled, Return to MAU as needed for emergencies   Contact information:   [address] [place] [id number] [phone]        Medication List    TAKE these medications        ranitidine 150 MG capsule  Commonly known as:  ZANTAC  Take 1 capsule (150 mg total) by mouth 2 (two) times daily as needed for heartburn.     VITAFOL GUMMIES 3.33-0.333-34.8 MG Chew  Chew 3 tablets by mouth daily.        Sharen CounterLisa Leftwich-Kirby Certified Nurse-Midwife 09/27/2015 9:55 PM

## 2015-09-27 NOTE — Discharge Instructions (Signed)
Acidez de estmago durante el embarazo  (Heartburn During Pregnancy) La acidez es la sensacin de ardor en el pecho que se siente cuando el cido del estmago vuelve haca el esfago. La acidez es frecuente en el embarazo debido a la liberacin de cierta hormona (progesterona). La progesterona relaja la vlvula que separa el esfago del [place]. Esto hace que el cido suba al esfago y cause acidez. Tambin puede ocurrir Visual merchandiser debido a que el tero al agrandarse empuja el estmago, lo que hace que suba ms cido al esfago. Esto se produce especialmente en las ltimas etapas del embarazo. La acidez generalmente desaparece despus del parto. CAUSAS  La acidez se siente cuando el cido del estmago vuelve hacia el esfago. Durante el [place], puede ser causada por distintas cosas, por ejemplo:   La hormona progesterona.  Cambios en los niveles hormonales.  El tero crece y [address] cido del estmago [place].  Comidas abundantes  Ciertos alimentos y [address] fsica.  Aumento en la produccin de cido SIGNOS Y SNTOMAS   Sensacin de ardor en el pecho o en la parte inferior de la garganta.  Sabor amargo en la boca.  Tos. DIAGNSTICO  El mdico diagnostica la [place] con una historia clnica cuidadosa en la que pregunta por sus molestias. Le indicar anlisis de sangre para Clinical research associate cierto tipo de bacteria que se asocia con la [place]. En algunos casos se diagnostica recetando un medicamento para calmar la acidez y viendo si los sntomas mejoran. En algunos casos, se realiza un procedimiento llamado endoscopa. En este procedimiento se [place] un tubo con Neomia Dear luz y Posey Boyer en un extremo (endoscopio) , y se examina el esfago y Investment banker, corporate. TRATAMIENTO  El tratamiento variar segn la gravedad de los sntomas. El mdico podr indicar:  Medicamentos de Sales promotion account executive (anticidos, medicamentos para Conservator, museum/gallery Engineering geologist) en los casos de acidez leve.  Medicamentos  recetados para disminuir el cido estomacal o para proteger la superficie del [place].  Ciertos cambios en la dieta.  Elevacin de la cabecera de la cama colocando bloques debajo de las patas. De esta manera evitar que el cido del estmago vuelva al esfago mientras est recostado. INSTRUCCIONES PARA EL CUIDADO EN EL HOGAR   Tome slo medicamentos de venta libre o recetados, segn las indicaciones del mdico.  Eleve la cabecera de la cama colocando bloques debajo de las patas, si el mdico lo aconsej. Usar ms almohadas al dormir no es Secretary/administrator porque solo modificara la posicin de la cabeza.  No  haga ejercicios enseguida despus de comer.  Evite comer 2 o 3horas antes de irse a dormir. No se acueste enseguida despus de comer.  Haga comidas pequeas Freight forwarder de tres comidas abundantes.  Identifique los alimentos o las bebidas que empeoran sus sntomas y evtelos. Los alimentos que debe evitar son:  [place].  Chocolate.  Alimentos con alto contenido de grasas, incluyendo las comidas fritas  Comidas muy condimentadas.  Ajo y [address]  Ctricos, como naranja, pomelo, limn y lima  Alimentos o productos que contengan tomate  Menta.  Bebidas gaseosas y con cafena.  [place] SOLICITE ATENCIN MDICA SI:  Tiene cualquier tipo de dolor abdominal.  Siente ardor en la parte superior del abdomen o el pecho, especialmente despus de comer o mientras est acostada.  Tiene nuseas o vmitos.  Siente malestar estomacal despus de comer. SOLICITE ATENCIN MDICA DE INMEDIATO SI:   Siente un dolor intenso en el pecho que  baja por el brazo o va hacia al mandbula o el cuello.  Se siente mareado o sufre un desmayo.  Comienza a sentir falta de aire.  Vomita sangre.  Tiene dificultad o dolor al tragar.  La materia fecal es negra, de aspecto alquitranado.  Tiene acidez ms de 3 veces por semana, durante ms de 2 semanas. ASEGRESE DE QUE:  Comprende  estas instrucciones.  Controlar su afeccin.  Recibir ayuda de inmediato si no mejora o si empeora.   Esta informacin no tiene Theme park managercomo fin reemplazar el consejo del mdico. Asegrese de hacerle al mdico cualquier pregunta que tenga.   Document Released: 12/20/2004 Document Revised: 04/02/2014 Elsevier Interactive Patient Education 2016 [employer]  Heartburn During Pregnancy Heartburn is a burning sensation in the chest caused by stomach acid backing up into the esophagus. Heartburn is common in pregnancy because a certain hormone (progesterone) is released when a woman is pregnant. The progesterone hormone may relax the valve that separates the esophagus from the stomach. This allows acid to go up into the esophagus, causing heartburn. Heartburn may also happen in pregnancy because the enlarging uterus pushes up on the stomach, which pushes more acid into the esophagus. This is especially true in the later stages of pregnancy. Heartburn problems usually go away after giving birth. CAUSES  Heartburn is caused by stomach acid backing up into the esophagus. During pregnancy, this may result from various things, including:   The progesterone hormone.  Changing hormone levels.  The growing uterus pushing stomach acid upward.  Large meals.  Certain foods and drinks.  Exercise.  Increased acid production. SIGNS AND SYMPTOMS   Burning pain in the chest or lower throat.  Bitter taste in the mouth.  Coughing. DIAGNOSIS  Your health care provider will typically diagnose heartburn by taking a careful history of your concern. Blood tests may be done to check for a certain type of bacteria that is associated with heartburn. Sometimes, heartburn is diagnosed by prescribing a heartburn medicine to see if the symptoms improve. In some cases, a procedure called an endoscopy may be done. In this procedure, a tube with a light and a camera on the end (endoscope) is used to examine the esophagus  and the stomach. TREATMENT  Treatment will vary depending on the severity of your symptoms. Your health care provider may recommend:  Over-the-counter medicines (antacids, acid reducers) for mild heartburn.  Prescription medicines to decrease stomach acid or to protect your stomach lining.  Certain changes in your diet.  Elevating the head of your bed by putting blocks under the legs. This helps prevent stomach acid from backing up into the esophagus when you are lying down. HOME CARE INSTRUCTIONS   Only take over-the-counter or prescription medicines as directed by your health care provider.  Raise the head of your bed by putting blocks under the legs if instructed to do so by your health care provider. Sleeping with more pillows is not effective because it only changes the position of your head.  Do not exercise right after eating.  Avoid eating 2-3 hours before bed. Do not lie down right after eating.  Eat small meals throughout the day instead of three large meals.  Identify foods and beverages that make your symptoms worse and avoid them. Foods you may want to avoid include:  Peppers.  Chocolate.  High-fat foods, including fried foods.  Spicy foods.  Garlic and onions.  Citrus fruits, including oranges, grapefruit, lemons, and limes.  Food containing tomatoes or  tomato products.  Mint.  Carbonated and caffeinated drinks.  Vinegar. SEEK MEDICAL CARE IF:  You have abdominal pain of any kind.  You feel burning in your upper abdomen or chest, especially after eating or lying down.  You have nausea and vomiting.  Your stomach feels upset after you eat. SEEK IMMEDIATE MEDICAL CARE IF:   You have severe chest pain that goes down your arm or into your jaw or neck.  You feel sweaty, dizzy, or light-headed.  You become short of breath.  You vomit blood.  You have difficulty or pain with swallowing.  You have bloody or black, tarry stools.  You have  episodes of heartburn more than 3 times a week, for more than 2 weeks. MAKE SURE YOU:  Understand these instructions.  Will watch your condition.  Will get help right away if you are not doing well or get worse.   This information is not intended to replace advice given to you by your health care provider. Make sure you discuss any questions you have with your health care provider.   Document Released: 03/09/2000 Document Revised: 04/02/2014 Document Reviewed: 10/29/2012 Elsevier Interactive Patient Education 2016 Elsevier Inc.   Dolor abdominal en el embarazo (Abdominal Pain During Pregnancy) El dolor abdominal es frecuente durante el [place]. Generalmente no causa ningn dao. El dolor abdominal puede tener numerosas causas. Algunas causas son ms graves que otras. Ciertas causas de dolor abdominal durante el embarazo se diagnostican fcilmente. A veces, se tarda un tiempo para llegar al diagnstico. Otras veces la causa no se conoce. El dolor abdominal puede estar relacionado con [place] alteracin del [place], o puede deberse a una causa totalmente diferente. Por este motivo, siempre consulte a su mdico cuando sienta molestias abdominales. INSTRUCCIONES PARA EL CUIDADO EN EL HOGAR  Est atenta al dolor para ver si hay cambios. Las siguientes indicaciones ayudarn a Psychologist, educational [employer] pueda sentir:  No Chiropodist sexuales y no coloque nada dentro de la vagina hasta que los sntomas hayan desaparecido completamente.  Descanse todo lo que pueda [employer] dolor se le haya calmado.  Si siente nuseas, beba lquidos claros. Evite los alimentos slidos mientras sienta malestar o tenga nuseas.  Tome slo medicamentos de venta libre o recetados, segn las indicaciones del mdico.  Cumpla con todas las visitas de control, segn le indique su mdico. SOLICITE ATENCIN MDICA DE INMEDIATO SI:  Tiene un sangrado, prdida de lquidos o elimina tejidos por la  vagina.  El dolor o los clicos [place].  Tiene vmitos persistentes.  Comienza a Financial risk analyst al orinar u [employer].  Tiene fiebre.  Nota que los movimientos del beb disminuyen.  Siente intensa debilidad o se marea.  Tiene dificultad para respirar con o sin dolor abdominal.  Siente un dolor de cabeza intenso junto al dolor abdominal.  [place]le [place] secrecin vaginal anormal con dolor abdominal.  Tiene diarrea persistente.  El dolor abdominal sigue o empeora an despus de Field seismologist. ASEGRESE DE QUE:   Comprende estas instrucciones.  Controlar su afeccin.  Recibir ayuda de inmediato si no mejora o si empeora.   Esta informacin no tiene Theme park manager el consejo del mdico. Asegrese de hacerle al mdico cualquier pregunta que tenga.   Document Released: 03/12/2005 Document Revised: 12/31/2012 Elsevier Interactive Patient Education 2016 Elsevier Inc. Abdominal Pain During Pregnancy Abdominal pain is common in pregnancy. Most of the time, it does not cause harm. There are many causes of abdominal pain. Some causes are  more serious than others. Some of the causes of abdominal pain in pregnancy are easily diagnosed. Occasionally, the diagnosis takes time to understand. Other times, the cause is not determined. Abdominal pain can be a sign that something is very wrong with the pregnancy, or the pain may have nothing to do with the pregnancy at all. For this reason, always tell your health care provider if you have any abdominal discomfort. HOME CARE INSTRUCTIONS  Monitor your abdominal pain for any changes. The following actions may help to alleviate any discomfort you are experiencing:  Do not have sexual intercourse or put anything in your vagina until your symptoms go away completely.  Get plenty of rest until your pain improves.  Drink clear fluids if you feel nauseous. Avoid solid food as long as you are uncomfortable or nauseous.  Only take  over-the-counter or prescription medicine as directed by your health care provider.  Keep all follow-up appointments with your health care provider. SEEK IMMEDIATE MEDICAL CARE IF:  You are bleeding, leaking fluid, or passing tissue from the vagina.  You have increasing pain or cramping.  You have persistent vomiting.  You have painful or bloody urination.  You have a fever.  You notice a decrease in your baby's movements.  You have extreme weakness or feel faint.  You have shortness of breath, with or without abdominal pain.  You develop a severe headache with abdominal pain.  You have abnormal vaginal discharge with abdominal pain.  You have persistent diarrhea.  You have abdominal pain that continues even after rest, or gets worse. MAKE SURE YOU:   Understand these instructions.  Will watch your condition.  Will get help right away if you are not doing well or get worse.   This information is not intended to replace advice given to you by your health care provider. Make sure you discuss any questions you have with your health care provider.   Document Released: 03/12/2005 Document Revised: 12/31/2012 Document Reviewed: 10/09/2012 Elsevier Interactive Patient Education [employer]2016 Elsevier Inc.

## 2015-09-27 NOTE — MAU Note (Signed)
Pt c/o abdominal pain that started about 25 mins ago after eating tamales. Denies LOF or vag bleeding.

## 2015-10-06 ENCOUNTER — Other Ambulatory Visit: Payer: Medicaid Other

## 2015-10-06 ENCOUNTER — Ambulatory Visit (INDEPENDENT_AMBULATORY_CARE_PROVIDER_SITE_OTHER): Payer: Medicaid Other | Admitting: Certified Nurse Midwife

## 2015-10-06 VITALS — BP 121/75 | HR 82 | Temp 97.1°F | Wt 212.0 lb

## 2015-10-06 DIAGNOSIS — Z3402 Encounter for supervision of normal first pregnancy, second trimester: Secondary | ICD-10-CM

## 2015-10-06 LAB — POCT URINALYSIS DIPSTICK
BILIRUBIN UA: NEGATIVE
Blood, UA: NEGATIVE
Glucose, UA: 250
KETONES UA: NEGATIVE
LEUKOCYTES UA: NEGATIVE
NITRITE UA: NEGATIVE
PH UA: 5
SPEC GRAV UA: 1.02
UROBILINOGEN UA: NEGATIVE

## 2015-10-06 NOTE — Progress Notes (Signed)
I agree with note by NP Student Andrew Brake.  Was present for exam.  R.Nabila Albarracin CNM 

## 2015-10-06 NOTE — Progress Notes (Signed)
Subjective:    [name] is a 20 y.o. female being seen today for her obstetrical visit. She is at 3866w6d gestation. Patient reports: no complaints . Fetal movement: normal.  Problem List Items Addressed This Visit    None    Visit Diagnoses    Encounter for supervision of normal first pregnancy in second trimester    -  Primary    Relevant Orders    Glucose Tolerance, 2 Hours w/1 Hour    CBC    HIV antibody    RPR    POCT urinalysis dipstick (Completed)      Patient Active Problem List   Diagnosis Date Noted  . Supervision of normal first pregnancy in first trimester 07/04/2015  . Abdominal pain affecting pregnancy, antepartum 05/18/2015   Objective:    BP 121/75 mmHg  Pulse 82  Temp(Src) 97.1 F (36.2 C)  Wt 212 lb (96.163 kg)  LMP 04/01/2015 FHT: 145 BPM  Uterine Size: size equals dates     Assessment:    Pregnancy @ 5066w6d   Doing well. Plan:    OBGCT: today. Signs and symptoms of preterm labor: discussed.  Labs, problem list reviewed and updated 2 hr GTT planned Follow up in 2 weeks.

## 2015-10-06 NOTE — Progress Notes (Signed)
Patient reports no concerns today 

## 2015-10-07 ENCOUNTER — Ambulatory Visit (HOSPITAL_COMMUNITY)
Admission: RE | Admit: 2015-10-07 | Discharge: 2015-10-07 | Disposition: A | Discharge status: Home / Self Care | Payer: Medicaid Other | Source: Ambulatory Visit | Attending: Certified Nurse Midwife | Admitting: Certified Nurse Midwife

## 2015-10-07 DIAGNOSIS — Z3402 Encounter for supervision of normal first pregnancy, second trimester: Secondary | ICD-10-CM | POA: Insufficient documentation

## 2015-10-07 DIAGNOSIS — Z36 Encounter for antenatal screening of mother: Secondary | ICD-10-CM | POA: Diagnosis present

## 2015-10-07 DIAGNOSIS — Z3A27 27 weeks gestation of pregnancy: Secondary | ICD-10-CM | POA: Insufficient documentation

## 2015-10-07 LAB — CBC
Hematocrit: 36.7 % (ref 34.0–46.6)
Hemoglobin: 11.9 g/dL (ref 11.1–15.9)
MCH: 28.7 pg (ref 26.6–33.0)
MCHC: 32.4 g/dL (ref 31.5–35.7)
MCV: 89 fL (ref 79–97)
PLATELETS: 259 10*3/uL (ref 150–379)
RBC: 4.14 x10E6/uL (ref 3.77–5.28)
RDW: 13.5 % (ref 12.3–15.4)
WBC: 11.3 10*3/uL — AB (ref 3.4–10.8)

## 2015-10-07 LAB — GLUCOSE TOLERANCE, 2 HOURS W/ 1HR
GLUCOSE, 1 HOUR: 104 mg/dL (ref 65–179)
GLUCOSE, 2 HOUR: 137 mg/dL (ref 65–152)
GLUCOSE, FASTING: 74 mg/dL (ref 65–91)

## 2015-10-07 LAB — RPR: RPR: NONREACTIVE

## 2015-10-07 LAB — HIV ANTIBODY (ROUTINE TESTING W REFLEX): HIV SCREEN 4TH GENERATION: NONREACTIVE

## 2015-10-11 ENCOUNTER — Other Ambulatory Visit: Payer: Self-pay | Admitting: Certified Nurse Midwife

## 2015-10-21 ENCOUNTER — Ambulatory Visit (INDEPENDENT_AMBULATORY_CARE_PROVIDER_SITE_OTHER): Payer: Medicaid Other | Admitting: Certified Nurse Midwife

## 2015-10-21 VITALS — BP 106/70 | HR 89 | Temp 98.0°F | Wt 216.0 lb

## 2015-10-21 DIAGNOSIS — Z3403 Encounter for supervision of normal first pregnancy, third trimester: Secondary | ICD-10-CM

## 2015-10-21 LAB — POCT URINALYSIS DIPSTICK
BILIRUBIN UA: NEGATIVE
Blood, UA: NEGATIVE
Glucose, UA: NEGATIVE
KETONES UA: NEGATIVE
Leukocytes, UA: NEGATIVE
Nitrite, UA: NEGATIVE
PH UA: 7
PROTEIN UA: NEGATIVE
SPEC GRAV UA: 1.015
Urobilinogen, UA: NEGATIVE

## 2015-10-21 NOTE — Progress Notes (Signed)
Subjective:    Lisa Herrera is a 20 y.o. female being seen today for her obstetrical visit. She is at [redacted]w[redacted]d gestation. Patient reports backache, no bleeding, no contractions, no cramping and no leaking. Fetal movement: normal.  Here for exam with interpreter.  Discussed birth control options.  Encouraged to choose pediatrician.    Problem List Items Addressed This Visit    None    Visit Diagnoses    Encounter for supervision of normal first pregnancy in third trimester    -  Primary   Relevant Orders   POCT urinalysis dipstick (Completed)     Patient Active Problem List   Diagnosis Date Noted  . Supervision of normal first pregnancy in first trimester 07/04/2015  . Abdominal pain affecting pregnancy, antepartum 05/18/2015   Objective:    BP 106/70   Pulse 89   Temp 98 F (36.7 C)   Wt 216 lb (98 kg)   LMP 04/01/2015   BMI 42.18 kg/m  FHT:  138 BPM  Uterine Size: size equals dates  Presentation: cephalic     Assessment:    Pregnancy @ [redacted]w[redacted]d weeks   Normal pregnancy discomforts  Plan:     labs reviewed, problem list updated Consent signed. GBS planning TDAP offered  Rhogam given for RH negative Pediatrician: discussed. Infant feeding: plans to breastfeed. Maternity leave: discussed. Cigarette smoking: never smoked. Orders Placed This Encounter  Procedures  . POCT urinalysis dipstick   No orders of the defined types were placed in this encounter.  Follow up in 2 Weeks.

## 2015-10-21 NOTE — Progress Notes (Signed)
Pt is feeling lower pelvic pressure.

## 2015-10-21 NOTE — Patient Instructions (Addendum)
Tercer trimestre de embarazo (Third Trimester of Pregnancy) El tercer trimestre comprende desde la semana29 hasta la semana42, es decir, desde el mes7 hasta el mes9. El tercer trimestre es un perodo en el que el feto crece rpidamente. Hacia el final del noveno mes, el feto mide alrededor de 20pulgadas (45cm) de largo y pesa entre 6 y 10 libras (2,700 y 4,500kg).  CAMBIOS EN EL ORGANISMO Su organismo atraviesa por muchos cambios durante el embarazo, y estos varan de una mujer a otra.   Seguir aumentando de peso. Es de esperar que aumente entre 25 y 35libras (11 y 16kg) hacia el final del embarazo.  Podrn aparecer las primeras estras en las caderas, el abdomen y las mamas.  Puede tener necesidad de orinar con ms frecuencia porque el feto baja hacia la pelvis y ejerce presin sobre la vejiga.  Debido al embarazo podr sentir acidez estomacal con frecuencia.  Puede estar estreida, ya que ciertas hormonas enlentecen los movimientos de los msculos que empujan los desechos a travs de los intestinos.  Pueden aparecer hemorroides o abultarse e hincharse las venas (venas varicosas).  Puede sentir dolor plvico debido al aumento de peso y a que las hormonas del embarazo relajan las articulaciones entre los huesos de la pelvis. El dolor de espalda puede ser consecuencia de la sobrecarga de los msculos que soportan la postura.  Tal vez haya cambios en el cabello que pueden incluir su engrosamiento, crecimiento rpido y cambios en la textura. Adems, a algunas mujeres se les cae el cabello durante o despus del embarazo, o tienen el cabello seco o fino. Lo ms probable es que el cabello se le normalice despus del nacimiento del beb.  Las mamas seguirn creciendo y le dolern. A veces, puede haber una secrecin amarilla de las mamas llamada calostro.  El ombligo puede salir hacia afuera.  Puede sentir que le falta el aire debido a que se expande el tero.  Puede notar que el feto  "baja" o lo siente ms bajo, en el abdomen.  Puede tener una prdida de secrecin mucosa con sangre. Esto suele ocurrir en el trmino de unos pocos das a una semana antes de que comience el trabajo de parto.  El cuello del tero se vuelve delgado y blando (se borra) cerca de la fecha de parto. QU DEBE ESPERAR EN LOS EXMENES PRENATALES  Le harn exmenes prenatales cada 2semanas hasta la semana36. A partir de ese momento le harn exmenes semanales. Durante una visita prenatal de rutina:  La pesarn para asegurarse de que usted y el feto estn creciendo normalmente.  Le tomarn la presin arterial.  Le medirn el abdomen para controlar el desarrollo del beb.  Se escucharn los latidos cardacos fetales.  Se evaluarn los resultados de los estudios solicitados en visitas anteriores.  Le revisarn el cuello del tero cuando est prxima la fecha de parto para controlar si este se ha borrado. Alrededor de la semana36, el mdico le revisar el cuello del tero. Al mismo tiempo, realizar un anlisis de las secreciones del tejido vaginal. Este examen es para determinar si hay un tipo de bacteria, estreptococo Grupo B. El mdico le explicar esto con ms detalle. El mdico puede preguntarle lo siguiente:  Cmo le gustara que fuera el parto.  Cmo se siente.  Si siente los movimientos del beb.  Si ha tenido sntomas anormales, como prdida de lquido, sangrado, dolores de cabeza intensos o clicos abdominales.  Si est consumiendo algn producto que contenga tabaco, como cigarrillos, tabaco   de mascar y cigarrillos electrnicos.  Si tiene alguna pregunta. Otros exmenes o estudios de deteccin que pueden realizarse durante el tercer trimestre incluyen lo siguiente:  Anlisis de sangre para controlar los niveles de hierro (anemia).  Controles fetales para determinar su salud, nivel de actividad y crecimiento. Si tiene alguna enfermedad o hay problemas durante el embarazo, le harn  estudios.  Prueba del VIH (virus de inmunodeficiencia humana). Si corre un riesgo alto, pueden realizarle una prueba de deteccin del VIH durante el tercer trimestre del embarazo. FALSO TRABAJO DE PARTO Es posible que sienta contracciones leves e irregulares que finalmente desaparecen. Se llaman contracciones de Braxton Hicks o falso trabajo de parto. Las contracciones pueden durar horas, das o incluso semanas, antes de que el verdadero trabajo de parto se inicie. Si las contracciones ocurren a intervalos regulares, se intensifican o se hacen dolorosas, lo mejor es que la revise el mdico.  SIGNOS DE TRABAJO DE PARTO   Clicos de tipo menstrual.  Contracciones cada 5minutos o menos.  Contracciones que comienzan en la parte superior del tero y se extienden hacia abajo, a la zona inferior del abdomen y la espalda.  Sensacin de mayor presin en la pelvis o dolor de espalda.  Una secrecin de mucosidad acuosa o con sangre que sale de la vagina. Si tiene alguno de estos signos antes de la semana37 del embarazo, llame a su mdico de inmediato. Debe concurrir al hospital para que la controlen inmediatamente. INSTRUCCIONES PARA EL CUIDADO EN EL HOGAR   Evite fumar, consumir hierbas, beber alcohol y tomar frmacos que no le hayan recetado. Estas sustancias qumicas afectan la formacin y el desarrollo del beb.  No consuma ningn producto que contenga tabaco, lo que incluye cigarrillos, tabaco de mascar y cigarrillos electrnicos. Si necesita ayuda para dejar de fumar, consulte al mdico. Puede recibir asesoramiento y otro tipo de recursos para dejar de fumar.  Siga las indicaciones del mdico en relacin con el uso de medicamentos. Durante el embarazo, hay medicamentos que son seguros de tomar y otros que no.  Haga ejercicio solamente como se lo haya indicado el mdico. Sentir clicos uterinos es un buen signo para detener la actividad fsica.  Contine comiendo alimentos sanos con  regularidad.  Use un sostn que le brinde buen soporte si le duelen las mamas.  No se d baos de inmersin en agua caliente, baos turcos ni saunas.  Use el cinturn de seguridad en todo momento mientras conduce.  No coma carne cruda ni queso sin cocinar; evite el contacto con las bandejas sanitarias de los gatos y la tierra que estos animales usan. Estos elementos contienen grmenes que pueden causar defectos congnitos en el beb.  Tome las vitaminas prenatales.  Tome entre 1500 y 2000mg de calcio diariamente comenzando en la semana20 del embarazo hasta el parto.  Si est estreida, pruebe un laxante suave (si el mdico lo autoriza). Consuma ms alimentos ricos en fibra, como vegetales y frutas frescos y cereales integrales. Beba gran cantidad de lquido para mantener la orina de tono claro o color amarillo plido.  Dese baos de asiento con agua tibia para aliviar el dolor o las molestias causadas por las hemorroides. Use una crema para las hemorroides si el mdico la autoriza.  Si tiene venas varicosas, use medias de descanso. Eleve los pies durante 15minutos, 3 o 4veces por da. Limite el consumo de sal en su dieta.  Evite levantar objetos pesados, use zapatos de tacones bajos y mantenga una buena postura.  Descanse   con las piernas elevadas si tiene calambres o dolor de cintura.  Visite a su dentista si no lo ha Occupational hygienist. Use un cepillo de dientes blando para higienizarse los dientes y psese el hilo dental con suavidad.  Puede seguir [employer], a menos que el mdico le indique lo contrario.  No haga viajes largos excepto que sea absolutamente necesario y solo con la autorizacin del mdico.  Tome clases prenatales para Financial trader, Education administrator y hacer preguntas sobre el [place] de parto y [place].  Haga un ensayo de la partida al hospital.  Prepare el bolso que llevar al hospital.  Prepare la habitacin del beb.  Concurra a todas  las visitas prenatales segn las indicaciones de su mdico. SOLICITE ATENCIN MDICA SI:  No est segura de que est en trabajo de parto o de que ha roto la bolsa de las aguas.  Tiene mareos.  Siente clicos leves, presin en la pelvis o dolor persistente en el abdomen.  Tiene nuseas, vmitos o diarrea persistentes.  Brett Fairy secrecin vaginal con mal olor.  Siente dolor al [employer]ips. SOLICITE ATENCIN MDICA DE INMEDIATO SI:   Tiene fiebre.  Tiene una prdida de lquido por la vagina.  Tiene sangrado o pequeas prdidas vaginales.  Siente dolor intenso o clicos en el abdomen.  Sube o baja de peso rpidamente.  Tiene dificultad para respirar y siente dolor de pecho.  Sbitamente se le hinchan mucho el rostro, las [place], los tobillos, los pies o las piernas.  No ha sentido los movimientos del beb durante Georgianne Fick.  Siente un dolor de cabeza intenso que no se alivia con medicamentos.  Su visin se modifica.   Esta informacin no tiene Theme park manager el consejo del mdico. Asegrese de hacerle al mdico cualquier pregunta que tenga.   Document Released: 12/20/2004 Document Revised: 04/02/2014 Elsevier Interactive Patient Education 2016 Elsevier Inc. Cuidados prenatales (Prenatal Care) QU SON LOS CUIDADOS PRENATALES?  Los cuidados prenatales son Sandi Mariscal se brindan a una embarazada antes del [place]. Los cuidados prenatales garantizan que la embarazada y el feto estn tan sanos como sea posible durante todo el [place]. Pueden brindar Goodrich Corporation tipo de cuidados [place], un mdico de atencin primaria o un especialista en parto y Psychiatrist ([place]). Los cuidados prenatales incluyen exmenes fsicos, estudios, tratamientos e informacin sobre nutricin, estilo de vida y servicios de apoyo social. POR QU SON TAN IMPORTANTES LOS CUIDADOS PRENATALES?  Los cuidados prenatales recibidos desde un inicio y de forma peridica aumentan la probabilidad de que usted y el  beb permanezcan sanos durante todo el [place]. Este tipo de cuidados tambin reduce el riesgo de que el beb nazca mucho antes de la fecha probable de parto (prematuro) o de que sea ms pequeo de lo previsto (pequeo para la edad gestacional).Durante las visitas prenatales, se Engineer, technical sales clase de enfermedad preexistente que usted pueda tener y que represente un riesgo durante el Psychiatrist. Tambin la monitorearn con regularidad para Insurance risk surveyor afeccin que pueda surgir Academic librarian, a fin de tratarla con rapidez y eficacia. QU SUCEDE DURANTE LAS VISITAS PRENATALES? Las visitas prenatales pueden incluir lo siguiente: Dilogo Informe al mdico cualquier signo o sntoma nuevo que haya tenido desde la ltima visita. Estos pueden incluir los siguientes:  Nuseas o vmitos.  Aumento o disminucin del nivel de Vidor.  Dificultad para dormir.  Dolor en la espalda o las piernas.  Cambios en [employer] Group.  Ganas frecuentes de Geographical information systems officer.  Falta de aire al  realizar actividad fsica.  Cambios en la piel, por ejemplo, una erupcin cutnea o picazn.  Sangrado o flujo vaginal.  Sensacin de excitacin o nerviosismo.  Cambios en los movimientos del feto. Es conveniente que escriba cualquier pregunta o tema del que quiera hablar con el mdico, para llevarlo anotado a la cita. Exmenes Durante la primera visita prenatal, es probable que le hagan un examen fsico completo. El [employer]ot revisar con frecuencia la vagina, el cuello del tero y la posicin del tero, adems de examinarle el corazn, los pulmones y otras partes del cuerpo. A medida que el embarazo avance, el mdico medir el tamao del tero y IT consultant posicin del feto dentro del tero. Tambin puede examinarla para [employer] primeros signos del [place]. Las visitas prenatales tambin pueden incluir el control de la presin arterial y, despus de 10 a 12semanas de embarazo, aproximadamente, el control de  los latidos del feto. Estudios Los estudios habituales suelen incluir lo siguiente:  Anlisis de [place]. Este anlisis examina la presencia de glucosa, protenas o signos de infeccin en la orina.  Recuento sanguneo. Este anlisis verifica el nivel de glbulos rojos y blancos en el organismo.  Pruebas de enfermedades de transmisin sexual (ETS). Las pruebas de Airline pilot de ETS al comienzo del embarazo son Neomia Dear prctica de rutina, y en muchos estados es obligacin practicarlas.  Anlisis de anticuerpos. La examinarn para ver si es inmune a determinadas enfermedades, como la [place], que puede afectar al feto en desarrollo.  Deteccin de glucosa. Entre la semana 24y 28de embarazo, le analizarn el nivel de glucemia para detectar signos de diabetes gestacional. Pueden recomendarle un anlisis de seguimiento.  Estreptococos del grupoB. Es comn encontrar estas bacterias dentro de la vagina. Este Abbott Laboratories indicar al mdico si necesita darle un antibitico para reducir la cantidad de este tipo de bacterias en el cuerpo antes del trabajo de parto y [place].  Ecografas. Alrededor de la semana 18a 20de embarazo, muchas embarazadas se hacen ecografas para evaluar la salud del feto y Engineer, manufacturing cualquier anomala en el desarrollo.  Prueba del VIH (virus de inmunodeficiencia humana). Al comienzo del Psychiatrist, le harn una prueba de deteccin del VIH. Si corre un riesgo alto de [place] VIH, pueden repetirle esta prueba durante el tercer trimestre del embarazo. Pueden indicarle otro tipo de estudios segn su edad, sus antecedentes mdicos personales o familiares, u otros factores.  CON QU FRECUENCIA DEBO VISITAR AL MDICO PARA LOS CUIDADOS PRENATALES? El programa de control correspondiente a los cuidados prenatales depender de cualquier enfermedad que usted tenga desde antes del embarazo o que haya desarrollado durante el mismo. Si usted no tiene Agricultural engineer, es probable que le  hagan los siguientes controles:  Physiological scientist vez al mes durante los primeros de [place].  Dos veces al mes durante el sptimo y el octavo mes de Emma.  Una vez a la [employer] Scientific noveno mes de Psychiatrist y [place]. Si presenta signos de trabajo de parto prematuro u otros signos o sntomas preocupantes, es posible que deba ver al mdico con ms frecuencia. Consulte al H[employer] Inc programa de cuidados prenatales ms adecuado para su caso. QU PUEDO HACER PARA QUE EL BEB Y YO ESTEMOS TAN SANOS COMO SEA POSIBLE DURANTE EL EMBARAZO?  Tome una vitamina prenatal que contenga (0,4mg ) de cido flico [employer]Max. El mdico tambin puede indicarle que tome vitaminas adicionales, como yodo, vitaminaD, hierro, cobre y zinc.  [place] de 1500  a 2000mg  de calcio todos los [address] desde la semana20 de Counsellor.  Asegrese de estar al da con las vacunas. A menos que el mdico le indique otra cosa:  Debe aplicarse la vacuna contra la difteria, el ttanos y la tosferina (Tdap) entre la semana27 y 36de embarazo, independientemente de la fecha en la que recibi la ltima vacuna Tdap. Esta vacuna ayuda a proteger al beb contra la tosferina despus del nacimiento.  Debe recibir Neomia Dear vacuna antigripal inactivada (IIV) anual como ayuda para protegerlos a usted y al beb de la gripe. Puede recibirla en cualquier momento del embarazo.  Siga una dieta bien equilibrada, que incluya lo siguiente:  Nils Pyle y verduras frescas.  Protenas magras.  Alimentos con FedEx de calcio, [place], [place], quesos duros y verduras de hojas color verde oscuro.  Panes integrales.  No coma frutos de mar con alto contenido de mercurio, por ejemplo:  Pez espada.  [place].  [place].  [place].  Ms de Sabino Snipes de atn por semana.  No coma lo siguiente:  Carnes o huevos crudos o mal cocidos.  Alimentos no pasteurizados, como quesos blandos (brie, [place] o feta), jugos  y [place].  Embutidos.  Salchichas que no se cocinaron en agua hirviendo.  Beba suficiente agua para mantener la orina clara o de color amarillo plido. Para muchas mujeres, la cantidad es de 10 o ms vasos de 8onzas de Regulatory affairs officer. El hecho de mantenerse hidratada ayuda a que el feto reciba nutrientes y Network engineer el inicio de contracciones uterinas prematuras.  No consuma ningn producto que contenga tabaco, como cigarrillos, tabaco de Theatre manager o Administrator, Civil Service. Si necesita ayuda para dejar de fumar, consulte al mdico.  No consuma bebidas que contengan alcohol. No se ha determinado que haya un nivel de consumo de alcohol que sea inocuo durante el [place].  No consuma drogas. Estas pueden daar al feto en desarrollo o causar un aborto espontneo.  Consulte al mdico o al farmacutico antes de tomar cualquier medicamento recetado o de venta libre, hierbas o suplementos.  Limite el consumo de cafena a no ms de 200mg  por da.  Haga actividad fsica. A menos que el mdico le indique otra cosa, intente hacer de ejercicio moderado la mayora de los [address] de la [place]. No practique actividades de alto impacto, deportes de contacto o actividades con alto riesgo de cadas, como equitacin o esqu extremo.  Descanse lo suficiente.  Evite todo aquello que aumente la temperatura corporal, como jacuzzis y saunas.  Si tiene un gato, no vace la bandeja sanitaria. Las bacterias presentes en las heces del gato pueden causar una infeccin llamada toxoplasmosis. Esta puede daar gravemente al feto.  Aljese de las sustancias qumicas como insecticidas, plomo y [place], y de los productos de limpieza o pinturas que contengan solventes.  No se saque ninguna radiografa, excepto si es necesaria por razones mdicas.  Tome una clase de preparacin para el parto y Mining engineer. Pregntele al mdico si necesita una derivacin o una recomendacin.   Esta informacin no tiene  Theme park manager el consejo del mdico. Asegrese de hacerle al mdico cualquier pregunta que tenga.   Document Released: 08/29/2007 Document Revised: 04/02/2014 Elsevier Interactive Patient Education 2016 Arvin[employer]itor. [place], la funcin del padre (Pregnancy, The Father's Role) El padre tiene una funcin importante mientras su mujer transita el [place], el [place], [place] parto y despus del nacimiento del beb. Es importante ayudar y brindar apoyo a su pareja en  esta nueva etapa. Son The PNC Financial cambios fsicos y emocionales que ocurren. Para poder ayudar y apoyar a la mujer embarazada durante esta etapa, debe conocer y comprender qu le sucede a ella durante el [place], el [address] de [place], [place] parto y despus de que naci el beb. CULES SON LAS ETAPAS DEL EMBARAZO? Generalmente, el embarazo dura unas 40semanas. El [place] se divide tres trimestres. Primer trimestre Durante las primeras 13semanas, su pareja puede experimentar lo siguiente:  Cansancio.  Dolor de [place].  Nuseas o vmitos.  Aumento de la frecuencia para Geographical information systems officer.  Cambios en el humor. Todos estos cambios son normales. Si esto sucede, trate de ser servicial, comprensivo y tolerante. Esto quizs incluya ayudar con las tareas y Optician, dispensing del Teacher, English as a foreign language, y pasar ms tiempo juntos. Segundo trimestre [employer] semana14 y la semana 28:  Probablemente su pareja se sienta mejor y con ms energa.  Esta es la mejor etapa del embarazo para estar juntos y Education officer, environmental ms actividades.  Podr ver cmo se le comienza a Ship broker.  Es posible que sienta al beb patear.  Su pareja puede tener molestia o dolor en la espalda a medida que [place] de [place]. Puede ayudarla cargando los objetos pesados y hacindole masajes en la espalda cuando sienta dolor. Tercer trimestre Durante las ltimas 12semanas, su pareja puede experimentar lo siguiente:  Mayor incomodidad a medida que crece el beb.  Dificultad para Textron Inc cotidianas y problemas con el equilibrio.  Dificultad para agacharse.  Cansancio frecuente.  Dificultad para dormir. En este momento, se acerca el nacimiento del beb. Usted y su pareja pueden tener preguntas o inquietudes. Esto es normal. Conversen entre ustedes y con el mdico. Siga ayudando a su pareja con los quehaceres domsticos, recomindele que descanse y Scientist, clinical (histocompatibility and immunogenetics) en la espalda y las piernas si eso la ayuda. QU PUEDO ESPERAR O HACER DURANTE EL EMBARAZO? Probablemente experimente algunos cambios. Tambin hay muchas cosas que puede hacer para preparase y preparar a su pareja para la llegada del beb. Cambios emocionales Durante el embarazo de su pareja, los cambios emocionales que usted puede experimentar pueden incluir lo siguiente:  Paediatric nurse felicidad, emocin y [place].  Estar preocupado por tener nuevas responsabilidades, como las financieras o [place].  Sentirse abrumado o temeroso.  Tener la preocupacin de que el beb cambiar la relacin con su pareja. Estos sentimientos son normales. Hable francamente con su pareja y con el mdico al [employer]. Cuidados prenatales Asista a las visitas de atencin prenatal con su pareja. Este es un buen momento para que conozca a su mdico, siga el Psychiatrist y haga preguntas.  Las visitas prenatales suelen realizarse una vez por mes durante seis meses, luego cada dos [address] meses y, por ltimo todas las semanas durante el ltimo mes. Es posible que tengan ms visitas prenatales si el mdico lo considera necesario.  El mdico generalmente realiza una ecografa del beb en una de las visitas prenatales. Tambin pueden ser ms frecuentes si el mdico lo considera necesario. Actividad sexual [employer] System sexuales son seguras a Glass blower/designer que exista un problema con Firefighter y el mdico les aconseje no tener relaciones sexuales. Debido a los cambios fsicos y Investment banker, corporate por los que atraviesa la mujer durante el  [place], es probable que no desee tener relaciones sexuales en determinados momentos. Probar diferentes posiciones puede hacer que sea ms cmodo Management consultant. No obstante, siempre respete la decisin de su pareja si no desea tener relaciones. Es [employer]Star  conversen sobre sus deseos y sentimientos. Hable con el mdico si tiene alguna pregunta Ryder System sexuales durante el [place]. Clases de preparto Asista a clases de preparto con su pareja si puede hacerlo. Las clases lo preparan y [place] a comprender qu sucede durante el [place] de parto y [place], [address] de fomentar el vnculo entre usted y su pareja. Incluso hay clases que son nicamente para los futuros padres. Las clases tambin le ensean a usted y a su pareja lo siguiente:  Diversas tcnicas de Microbiologist.  Cmo colaborar con los dolores del [place] de [place].  Cmo concentrarse durante el trabajo de parto y [place]. QU DEBO SABER SOBRE EL TRABAJO DE PARTO Y EL PARTO? Muchos padres desean estar presentes mientras su pareja est en el trabajo de parto y [place]. Posiblemente pueda hacer lo siguiente:  Tomar el tiempo de las contracciones, hacerle masajes a su pareja en la espalda y respirar con ella durante las contracciones.  Ver y disfrutar el nacimiento de su beb, e incluso cortar el cordn umbilical del beb. Si siente que se va a desmayar o est incmodo, pdale a alguien que lo ayude.  Retirarse de la habitacin si ocurre algn problema durante el trabajo de parto o [place]. Un parto por cesrea es un procedimiento que se puede Chemical engineer para dar a luz a su beb. Se realiza a travs de una incisin en el abdomen y Careers information officer. Un parto por cesrea se puede programar o puede ser un procedimiento de emergencia durante el trabajo de parto y [place]. La Harley-[employer]son de los hospitales permiten que el padre est en el quirfano durante un parto por cesrea a menos que se trate de Radio broadcast assistant. La  recuperacin de un parto por cesrea suele requerir ms ayuda del padre. QU SUCEDE DESPUS DEL PARTO? Despus de que nace el beb, la madre atravesar muchos cambios nuevamente. Estos cambios pueden durar unos meses o ms. Depresin posparto Es posible que su pareja tarde un tiempo en recuperar sus fuerzas. Tambin puede sentir tristeza (depresin posparto). Si su pareja tiene una tristeza o depresin poco habituales en ella, hable con el mdico de inmediato. Puede tratarse de una afeccin mdica seria que requiere tratamiento. Lizbeth Bark materna Quizs su pareja decida amamantar al beb. Esto contribuye a que la madre y el beb establezcan un vnculo, y la [place] materna es el mejor alimento para el beb. Puede sentirse incluido haciendo eructar al beb y dndole el bibern con [place] materna recolectada de la [place]. Esto tambin le permitir a su Nurse, adult y a Physicist, medical a crear un vnculo con el beb. Actividad sexual El cuerpo de su pareja puede tardar un meses en sanar y estar preparado para retomar la actividad sexual. Esto puede demorar ms despus de un parto por cesrea. Si tiene Personnel officer sexuales o si a su Geographical information systems officer doloroso, hable con su mdico. Es posible que las madres que estn amamantando queden embarazadas aunque no tengan el perodo menstrual. Utilice anticonceptivos salvo que usted y su pareja deseen lograr un nuevo embarazo. QU DEBO RECORDAR? La paternidad y Wilburt Finlay un beb es [place] experiencia de aprendizaje constante. Es comn sentirse ansioso, preocupado y atemorizado de que quizs no est cuidando adecuadamente de su beb recin nacido. Es importante que hable con su pareja y su mdico si est preocupado o tiene preguntas.   Esta informacin no tiene Theme park manager el consejo del mdico. Asegrese de hacerle al  mdico cualquier pregunta que tenga.   Document Released: 12/31/2012 Document Revised: 04/02/2014 Elsevier  Interactive Patient Education 2016 [employer].  [place] del dolor durante el [place] de parto y Engineer, manufacturing systems (Pain Relief During Labor and Delivery) Todas las personas experimentan el dolor de [place], pero el trabajo de parto causa dolor intenso en muchas mujeres. El nivel de dolor que usted experimenta en el trabajo del parto depende de su tolerancia al dolor, de la intensidad de las contracciones y del tamao y posicin del beb. Hay muchas formas de prepararse y Software engineer, por ejemplo:   Tomar clases de preparto para aprender acerca del Aleen Campi de parto y [place]. Cuanto ms informada est, menos ansiedad y temor tendr. Vivia Budge puede ayudar a Teacher, early years/pre.  Tomar medicamentos para Engineer, materials durante el [place] de parto y [place].  Aprender y Chemical engineer tcnicas de relajacin.  Tomar una ducha o un bao de inmersin.  Recibir masajes.  Cambiar de posicin.  Colocarse una bolsa de hielo en la espalda. Comente las opciones para el control del dolor con su mdico durante las visitas prenatales.  CULES SON LAS DOS OPCIONES DE MEDICAMENTOS PARA ALIVIAR EL DOLOR? 1. Analgsicos. Estos son medicamentos que Psychologist, occupational sin que se pierdan totalmente la sensibilidad o los movimientos musculares. 2. Anestsicos. Estos son medicamentos que bloquean toda la sensibilidad, incluso Chief Technology Officer. Ambos tipos tienen efectos secundarios mnimos, como nuseas, dificultad para concentrarse, estar somnolienta y disminuir la frecuencia cardaca del beb. Sin embargo, el mdico tendr la precaucin de Building services engineer dosis que no afecten demasiado al beb.  CULES SON LOS TIPOS ESPECFICOS DE ANALGSICOS Y ANESTSICOS? Analgsico sistmico Los medicamentos para Chief Technology Officer sistmicos no se Psychologist, educational en la zona en la que lo siente sino que afectan todo su organismo. Este tipo de medicamento se administra a travs de una va IV en la vena o de modo inyectable  (inyeccin) en el msculo. Este medicamento Engineer, materials pero no lo calmar totalmente. Tambin la har sentir somnolienta, pero no har que pierda la conciencia.  Anestsico local El anestsico local seutiliza paraadormecer una pequea zona del cuerpo. El medicamento se inyecta en la zona de los nervios que transmiten la sensibilidad a la vagina, la vulva o la zona que se encuentra entre la vagina y el ano (perineo). Anestesia general Este tipo de medicamento provoca la prdida de la conciencia de modo que no Financial risk analyst. Generalmente se [place] solo en caso de situaciones de emergencia durante el trabajo de [place]. Se administra a travs de una va IV o una mscara facial. Bloqueo paracervical Un bloqueo paracervical es una forma de anestesia local que se administra durante el [place] de [place]. Se inyecta el anestsico en las zonas derecha e izquierda del cuello del tero y la vagina. Esto ayuda a Teacher, early years/pre causado por las contracciones y el estiramiento del cuello del tero. Puede administrase ms de una vez.  Bloqueo pudendo El bloqueo pudendo es otra forma de anestesia local. Se utiliza para Acupuncturist dolor asociado con la presin o la distensin del perineo en el momento del [place]. Se aplica una inyeccin de manera profunda en la pared vaginal hacia el nervio pudendo en la pelvis, adormeciendo el perineo  Anestesia epidural La epidural es una inyeccin de medicamento anestsico que se aplica en la parte inferior de la espalda y en el espacio epidural, cerca de la mdula espinal. La epidural adormece la mitad inferior  del cuerpo. Podr mover las piernas, pero no Warehouse manager. La epidural se [place] en el trabajo de [place], el parto o la cesrea.  Para evitar que se termine el efecto del medicamento, le colocarn un tubo pequeo (catter) en el espacio epidural y lo pegarn con cinta en el lugar para evitar que se salga. Luego le administran el medicamento de [place] continua, en  pequeas dosis, a travs del tubo, hasta que finalice el [place]. Bloqueo espinal El bloqueo espinal es similar a la epidural, pero los medicamentos se inyectan en el lquido espinal y no en el espacio epidural. El bloqueo espinal solo se aplica una vez. Comienza a Dispensing optician, pero solo dura 1 o 2horas. El bloqueo espinal tambin se [place] en las cesreas.  Bloqueo espinal-epidural combinado El bloqueo espinal-epidural combinado rene los beneficios de ambos mtodos. La parte espinal acta rpidamente para Engineer, materials y la epidural proporciona alivio continuo del Engineer, mining. Hidroterapia La inmersin en agua caliente durante el trabajo de parto puede proporcionar comodidad y relax. Tambin ayuda a [employer], el uso de anestesia y la duracin del [place]. Sin embargo, la inmersin en agua durante el parto (parto en el agua) puede Advertising copywriter, y se estn realizando estudios para Chief Strategy Officer su seguridad y sus riesgos. Si es [employer] sana que espera un parto sin complicaciones, hable con su mdico para ver si el parto en el agua es una opcin para usted.    Esta informacin no tiene Theme park manager el consejo del mdico. Asegrese de hacerle al mdico cualquier pregunta que tenga.   Document Released: 01/07/2009 Document Revised: 03/17/2013 Elsevier Interactive Patient Education 2016 [employer].  Consejos para la extraccin de la leche materna con un sacaleche (Breast Pumping Tips) Si est amamantando, tal vez haya momentos en los que no pueda alimentar directamente al beb, por ejemplo, cuando regrese a trabajar o est de viaje. La extraccin con un sacaleche le permite guardar la leche materna y alimentar al beb ms tarde.  Cuando empiece con las extracciones, es posible que no tenga mucha cantidad de [place]. Las [employer] Foods deben comenzar a producir ms despus de Time Warner. Si se extrae leche materna con un sacaleche en los horarios en los que  suele alimentar al beb, es posible que pueda seguir produciendo la cantidad de [place] suficiente para alimentarlo, sin tener que darle tambin [place] maternizada. Cuanto ms a menudo realice las extracciones, ms cantidad de [employer] Hotels su cuerpo. CUNDO DEBO EXTRAERME LECHE MATERNA CON UN SACALECHE?   Puede empezar con las extracciones inmediatamente despus de tener al beb. Pregntele al mdico qu es lo adecuado para usted y el beb.  Si regresar a Printmaker, empiece a extraerse [employer] Pal[employer] con un sacaleche algunas semanas antes. Esto le da tiempo para aprender cmo [employer] y para guardar un suministro de [place].  Cuando est con el beb, alimntelo cuando el pequeo tenga apetito. Extrigase leche materna despus de cada sesin de alimentacin.  Cuando no est con el beb durante muchas horas, extrigase [employer] Company unos , cada 2 o 3horas. Extraiga de ambos senos al mismo tiempo si es posible.  Si alimenta al beb con [employer]S Inc, extrigase leche materna aproximadamente a la misma hora.  Si bebe alcohol, espere 2horas antes de Public relations account executive. CMO ME PREPARO PARA LAS EXTRACCIONES? El reflejo de bajada es la reaccin natural del organismo que estimula el flujo de [place]. Es ms fcil  lograr que la leche fluya cuando usted est relajada. Pruebe estas cosas para relajarse:  Huela una de las [address] o una prenda del beb.  Mire una fotografa o un video del beb.  Sintese en un lugar que sea tranquilo y donde tenga privacidad.  Hgase masajes en la mama de la que extraer la [place].  Aplquese calor relajante en la mama.  Ponga msica que la relaje. CULES SON ALGUNOS CONSEJOS PARA LA EXTRACCIN DE LA LECHE MATERNA CON UN SACALECHE?  Lvese las manos antes de la extraccin. No es necesario que se lave los pezones ni las [place].  Hay tres formas de Public relations account executive. Puede hacer lo siguiente:  Hgase un  masaje con la mano y presione la mama.  Use un sacaleche porttil manual.  Use un sacaleche elctrico.  Asegrese de que la ventosa de succin del sacaleche sea del tamao correcto. Coloque la ventosa de succin directamente sobre el pezn. Si esta no es del Tour manager o est mal colocada, puede provocarle dolor o lastimarle el pezn.  Aplique una pequea cantidad de lanolina purificada o modificada en el pezn y la areola si tiene dolor.  Si utiliza un Counsellor, cambie la velocidad y la potencia de succin para que sean ms cmodas.  Tal vez necesite un tipo de sacaleche diferente si la extraccin le resulta dolorosa o si no logra extraer [employer]. El mdico puede ayudarla a elegir el tipo de Midwife a Chemical engineer.  Tenga siempre cerca una botella llena de agua. Beber abundante lquido la ayuda a producir ms [employer].  Puede guardar la leche para usarla ms tarde. La leche materna que se extrajo con un sacaleche puede guardarse en un recipiente o una bolsa de plstico estril con cierre hermtico. Ponga siempre la fecha de la extraccin en el recipiente.  La leche materna puede dejarse a temperatura ambiente durante 8horas como mximo.  Puede guardar la [employer] materna en el refrigerador durante 8das como mximo.  Puede guardar le [employer] materna en el congelador durante . Para descongelar la [employer]s, use agua tibia. No la ponga en el microondas.  No fumar. Pdale ayuda al mdico. CUNDO DEBO [facility] AL MDICO?  Tiene dificultades para extraerse leche.  Est preocupada porque no produce la cantidad de [employer].  Siente dolor o enrojecimiento en los pezones.  Desea tomar anticonceptivos.   Esta informacin no tiene Theme park manager el consejo del mdico. Asegrese de hacerle al mdico cualquier pregunta que tenga.   Document Released: 04/14/2010 Document Revised: 03/17/2013 Elsevier Interactive Patient Education 2016 [employer].  Antes de la llegada del beb al hogar (Before Baby Comes Home) Antes de que nazca el beb, es importante que se asegure de lo siguiente:  Wilburt Finlay todos los suministros que necesitar para el cuidado del beb.  Saber dnde ir si hay una emergencia.  Haber hablado de la llegada del beb con otros familiares. QU SUMINISTROS NECESITAR? Se recomienda que cuente con los siguientes suministros: Objetos grandes  [place].  Colchn para la cuna.  Asiento de seguridad [employer] atrs para el beb. Si es posible, un profesional capacitado debe garantizar que se haya instalado correctamente. Alimentacin  De 6a 8biberones con una capacidad de 4a 5onzas.  De 6a 8tetinas.  Un cepillo para biberones.  Un esterilizador, o una olla o pava grande con tapa.  Un mtodo para hervir y Financial trader.  Si amamantar:  Un sacaleches.  Crema para los pezones.  Sostn para Museum/gallery exhibitions officer.  Almohadillas para las mamas.  Pezoneras.  Si alimentar al beb con Alexis Goodell maternizada:  Leche maternizada.  Tazas medidoras.  Cucharas medidoras. El bao  Jabn y [place] para beb.  Vaselina.  Un pao y [place].  Una toalla con capucha.  [place].  Un lavatorio o recipiente para baar al beb. Otros suministros  Termmetro rectal.  Pera de goma.  Paos o toallitas para cuando le cambie el paal.  Bolsa para paales.  Cambiador.  Ropa, incluidos conjuntos enterizos y pijamas.  Alicates para bebs.  Mantas para envolver al beb.  Cubrecolchn y sbanas para la cuna.  Luz nocturna para el cuarto del beb.  Monitor para el beb.  De 2 a 3 chupetes.  De 24a 36paales de tela y protectores impermeables o una bolsa de paales descartables. Puede necesitar de 10 a 12paales al da. CMO ME PREPARO PARA UNA EMERGENCIA? Preprese para una emergencia de la siguiente manera:  Sepa cmo llegar al hospital ms cercano.  Tenga una lista de los  nmeros telefnicos de los profesionales que la atienden y que atendern al beb cerca del telfono fijo y en el telfono mvil. CMO PREPARO A MI FAMILIA?  Decida cmo organizar las visitas.  Si tiene otros hijos:  Hable con ellos sobre la llegada del beb al hogar. Pregnteles cmo se sienten.  Lales un libro sobre lo que significa ser un hermano o una hermana mayor.  Encuentre formas de permitirles que la ayuden a prepararse para el nuevo beb.  Tenga a alguien que est listo para cuidarlos cuando usted est en el hospital.   Esta informacin no tiene Theme park manager el consejo del mdico. Asegrese de hacerle al mdico cualquier pregunta que tenga.   Document Released: 06/08/2008 Document Revised: 07/27/2014 Elsevier Interactive Patient Education [employer].

## 2015-10-26 ENCOUNTER — Encounter: Payer: Medicaid Other | Admitting: Certified Nurse Midwife

## 2015-11-04 ENCOUNTER — Ambulatory Visit (INDEPENDENT_AMBULATORY_CARE_PROVIDER_SITE_OTHER): Payer: Medicaid Other | Admitting: Certified Nurse Midwife

## 2015-11-04 VITALS — BP 107/72 | HR 96 | Temp 98.6°F | Wt 223.6 lb

## 2015-11-04 DIAGNOSIS — Z3403 Encounter for supervision of normal first pregnancy, third trimester: Secondary | ICD-10-CM

## 2015-11-04 NOTE — Progress Notes (Signed)
Subjective:    Lisa JacobsonYvette Herrera is a 20 y.o. female being seen today for her obstetrical visit. She is at 3910w0d gestation. Patient reports backache, no bleeding, no cramping, no leaking and occasional contractions. Fetal movement: normal.  Reports having sugar this am.  Interpreter present for exam  Problem List Items Addressed This Visit    None    Visit Diagnoses   None.    Patient Active Problem List   Diagnosis Date Noted  . Supervision of normal first pregnancy in first trimester 07/04/2015  . Abdominal pain affecting pregnancy, antepartum 05/18/2015   Objective:    BP 107/72   Pulse 96   Temp 98.6 F (37 C)   Wt 223 lb 9.6 oz (101.4 kg)   LMP 04/01/2015   BMI 43.67 kg/m  FHT:  155 BPM  Uterine Size: 32 cm and size equals dates  Presentation: cephalic     Assessment:    Pregnancy @ 1210w0d weeks   Maternal obesity  Glucosuria: normal 2 hour OGTT  Language difficulty: speaks spanish  Plan:     labs reviewed, problem list updated Consent signed. GBS planning TDAP offered  Rhogam given for RH negative Pediatrician: discussed. Infant feeding: plans to breastfeed. Maternity leave: N/a. Cigarette smoking: never smoked. No orders of the defined types were placed in this encounter.  No orders of the defined types were placed in this encounter.  Follow up in 2 Weeks.

## 2015-11-11 ENCOUNTER — Inpatient Hospital Stay (HOSPITAL_COMMUNITY)
Admission: AD | Admit: 2015-11-11 | Discharge: 2015-11-11 | Disposition: A | Discharge status: Home / Self Care | Payer: Medicaid Other | Source: Ambulatory Visit | Attending: Obstetrics and Gynecology | Admitting: Obstetrics and Gynecology

## 2015-11-11 ENCOUNTER — Encounter (HOSPITAL_COMMUNITY): Payer: Self-pay | Admitting: *Deleted

## 2015-11-11 DIAGNOSIS — Z79899 Other long term (current) drug therapy: Secondary | ICD-10-CM | POA: Insufficient documentation

## 2015-11-11 DIAGNOSIS — O99513 Diseases of the respiratory system complicating pregnancy, third trimester: Secondary | ICD-10-CM | POA: Insufficient documentation

## 2015-11-11 DIAGNOSIS — O9989 Other specified diseases and conditions complicating pregnancy, childbirth and the puerperium: Secondary | ICD-10-CM

## 2015-11-11 DIAGNOSIS — Z3A32 32 weeks gestation of pregnancy: Secondary | ICD-10-CM | POA: Diagnosis not present

## 2015-11-11 DIAGNOSIS — J069 Acute upper respiratory infection, unspecified: Secondary | ICD-10-CM | POA: Diagnosis not present

## 2015-11-11 DIAGNOSIS — O26899 Other specified pregnancy related conditions, unspecified trimester: Secondary | ICD-10-CM

## 2015-11-11 DIAGNOSIS — R109 Unspecified abdominal pain: Secondary | ICD-10-CM

## 2015-11-11 DIAGNOSIS — J111 Influenza due to unidentified influenza virus with other respiratory manifestations: Secondary | ICD-10-CM | POA: Insufficient documentation

## 2015-11-11 LAB — URINE MICROSCOPIC-ADD ON

## 2015-11-11 LAB — URINALYSIS, ROUTINE W REFLEX MICROSCOPIC
BILIRUBIN URINE: NEGATIVE
GLUCOSE, UA: NEGATIVE mg/dL
KETONES UR: NEGATIVE mg/dL
LEUKOCYTES UA: NEGATIVE
Nitrite: NEGATIVE
PH: 6 (ref 5.0–8.0)
PROTEIN: NEGATIVE mg/dL
Specific Gravity, Urine: 1.02 (ref 1.005–1.030)

## 2015-11-11 NOTE — Discharge Instructions (Signed)
Safe Medications in Pregnancy   Acne:  Benzoyl Peroxide  Salicylic Acid   Backache/Headache:  Tylenol: 2 regular strength every 4 hours OR        2 Extra strength every 6 hours   Colds/Coughs/Allergies:  Benadryl (alcohol free) 25 mg every 6 hours as needed  Breath right strips  Claritin  Cepacol throat lozenges  Chloraseptic throat spray  Cold-Eeze- up to three times per day  Cough drops, alcohol free  Flonase (by prescription only)  Guaifenesin  Mucinex  Robitussin DM (plain only, alcohol free)  Saline nasal spray/drops  Sudafed (pseudoephedrine) & Actifed * use only after [redacted] weeks gestation and if you do not have high blood pressure  Tylenol  Vicks Vaporub  Zinc lozenges  Zyrtec   Constipation:  Colace  Ducolax suppositories  Fleet enema  Glycerin suppositories  Metamucil  Milk of magnesia  Miralax  Senokot  Smooth move tea   Diarrhea:  Kaopectate  Imodium A-D   *NO pepto Bismol   Hemorrhoids:  Anusol  Anusol HC  Preparation H  Tucks   Indigestion:  Tums  Maalox  Mylanta  Zantac  Pepcid   Insomnia:  Benadryl (alcohol free) 25mg  every 6 hours as needed  Tylenol PM  Unisom, no Gelcaps   Leg Cramps:  Tums  MagGel   Nausea/Vomiting:  Bonine  Dramamine  Emetrol  Ginger extract  Sea bands  Meclizine  Nausea medication to take during pregnancy:  Unisom (doxylamine succinate 25 mg tablets) Take one tablet daily at bedtime. If symptoms are not adequately controlled, the dose can be increased to a maximum recommended dose of two tablets daily (1/2 tablet in the morning, 1/2 tablet mid-afternoon and one at bedtime).  Vitamin B6 100mg  tablets. Take one tablet twice a day (up to 200 mg per day).   Skin Rashes:  Aveeno products  Benadryl cream or 25mg  every 6 hours as needed  Calamine Lotion  1% cortisone cream   Yeast infection:  Gyne-lotrimin 7  Monistat 7    **If taking multiple medications, please check labels to avoid  duplicating the same active ingredients  **take medication as directed on the label  ** Do not exceed 4000 mg of tylenol in 24 hours  **Do not take medications that contain aspirin or ibuprofen         Infeccin del tracto respiratorio superior, adultos (Upper Respiratory Infection, Adult) La mayora de las infecciones del tracto respiratorio superior son infecciones virales de las vas que llevan el aire a los pulmones. Un infeccin del tracto respiratorio superior afecta la nariz, la garganta y las vas respiratorias superiores. El tipo ms frecuente de infeccin del tracto respiratorio superior es la nasofaringitis, que habitualmente se conoce como "resfro comn". Las infecciones del tracto respiratorio superior siguen su curso y por lo general se curan solas. En la International Business Machinesmayora de los casos, la infeccin del tracto respiratorio superior no requiere atencin Canastotamdica, Biomedical engineerpero a veces, despus de una infeccin viral, puede surgir una infeccin bacteriana en las vas respiratorias superiores. Esto se conoce como infeccin secundaria. Las infecciones sinusales y en el odo medio son tipos frecuentes de infecciones secundarias en el tracto respiratorio superior. La neumona bacteriana tambin puede complicar un cuadro de infeccin del tracto respiratorio superior. Este tipo de infeccin puede empeorar el asma y la enfermedad pulmonar obstructiva crnica (EPOC). En algunos casos, estas complicaciones pueden requerir atencin mdica de emergencia y poner en peligro la vida.  CAUSAS Casi todas las infecciones del tracto respiratorio superior  se deben a los virus. Un virus es un tipo de microbio que puede contagiarse de Neomia Dearuna persona a Educational psychologistotra.  FACTORES DE RIESGO Puede estar en riesgo de sufrir una infeccin del tracto respiratorio superior si:   Fuma.  Tiene una enfermedad pulmonar o cardaca crnica.  Tiene debilitado el sistema de defensa (inmunitario) del cuerpo.  Es [address] [place] En[address]ancemuy joven o de edad muy  [place]avanzada.  Tiene asma o alergias nasales.  Trabaja en reas donde hay mucha gente o poca ventilacin.  Rudi Cocorabaja en una escuela o en un centro de atencin mdica. SIGNOS Y SNTOMAS  Habitualmente, los sntomas aparecen de 2a 3das despus de entrar en contacto con el virus del resfro. La mayora de las infecciones virales en el tracto respiratorio superior duran de 7a 10das. Sin embargo, las infecciones virales en el tracto respiratorio superior a causa del virus de la gripe pueden durar de 14a 18das y, habitualmente, son ms graves. Entre los sntomas se pueden incluir los siguientes:   Secrecin o congestin nasal.  Estornudos.  Tos.  Dolor de Advertising copywritergarganta.  Dolor de Turkmenistancabeza.  Fatiga.  Grant RutsFiebre.  Prdida del apetito.  Dolor en la frente, detrs de los ojos y por encima de los pmulos (dolor sinusal).  Dolores musculares. DIAGNSTICO  El mdico puede diagnosticar una infeccin del tracto respiratorio superior mediante los siguientes estudios:  Examen fsico.  Pruebas para verificar si los sntomas no se deben a otra afeccin, por ejemplo:  Faringitis estreptoccica.  Sinusitis.  Neumona.  Asma. TRATAMIENTO  Esta infeccin desaparece sola, con el tiempo. No puede curarse con medicamentos, pero a menudo se prescriben para aliviar los sntomas. Los medicamentos pueden ser tiles para lo siguiente:  Personal assistantBajar la fiebre.  Reducir la tos.  Aliviar la congestin nasal. INSTRUCCIONES PARA EL CUIDADO EN EL HOGAR   Tome los medicamentos solamente como se lo haya indicado el mdico.  A fin de Engineer, materialsaliviar el dolor de garganta, haga grgaras con solucin salina templada o consuma caramelos para la tos, como se lo haya indicado el mdico.  Use un humidificador de vapor clido o inhale el vapor de la ducha para aumentar la humedad del aire. Esto facilitar la respiracin.  Beba suficiente lquido para Photographermantener la orina clara o de color amarillo plido.  Consuma sopas y otros  caldos transparentes, y Abbott Laboratoriesalimntese bien.  Descanse todo lo que sea necesario.  Regrese al Aleen Campitrabajo cuando la temperatura se le haya normalizado o cuando el mdico lo autorice. Es posible que deba quedarse en su casa durante un tiempo prolongado, para no infectar a los dems. Tambin puede usar un barbijo y lavarse las manos con cuidado para Transport plannerevitar la propagacin del virus.  Aumente el uso del inhalador si tiene asma.  No consuma ningn producto que contenga tabaco, lo que incluye cigarrillos, tabaco de Theatre managermascar o Administrator, Civil Servicecigarrillos electrnicos. Si necesita ayuda para dejar de fumar, consulte al American Expressmdico. PREVENCIN  La mejor manera de protegerse de un resfro es mantener una higiene Simsadecuada.   Evite el contacto oral o fsico con personas que tengan sntomas de resfro.  En caso de contacto, lvese las manos con frecuencia. No hay pruebas claras de que la vitaminaC, la vitaminaE, la equincea o el ejercicio reduzcan la probabilidad de Primary school teachercontraer un resfro. Sin embargo, siempre se recomienda Insurance account managerdescansar mucho, hacer ejercicio y Engineering geologistalimentarse bien.  SOLICITE ATENCIN MDICA SI:   Su estado empeora en lugar de mejorar.  Los medicamentos no Estate agentlogran controlar los sntomas.  Tiene escalofros.  La sensacin de falta de aire  empeora.  Tiene mucosidad marrn o roja.  Tiene secrecin nasal amarilla o marrn.  Le duele la cara, especialmente al inclinarse hacia adelante.  Tiene fiebre.  Tiene los ganglios del cuello hinchados.  Siente dolor al tragar.  Tiene zonas blancas en la parte de atrs de la garganta. SOLICITE ATENCIN MDICA DE INMEDIATO SI:   Tiene sntomas intensos o persistentes de:  Dolor de [place].  Dolor de odos.  Dolor sinusal.  Dolor en el pecho.  Tiene enfermedad pulmonar crnica y cualquiera de estos sntomas:  Sibilancias.  Tos prolongada.  Tos con sangre.  Cambio en la mucosidad habitual.  Presenta rigidez en el cuello.  Tiene cambios en:  La visin.  La  audicin.  El pensamiento.  El [place] de nimo. ASEGRESE DE QUE:   Comprende estas instrucciones.  Controlar su afeccin.  Recibir ayuda de inmediato si no mejora o si empeora.   Esta informacin no tiene Theme park manager el consejo del mdico. Asegrese de hacerle al mdico cualquier pregunta que tenga.   Document Released: 12/20/2004 Document Revised: 07/27/2014 Elsevier Interactive Patient Education [employer]

## 2015-11-11 NOTE — MAU Provider Note (Signed)
History     CSN: [id number]  Arrival date and time: 11/11/15 1026   First Provider Initiated Contact with Patient 11/11/15 1101      Chief Complaint  Patient presents with  . Influenza  . Cough  . Sore Throat   HPI   Lisa Herrera is a 20 year old G1 P0 at 32 weeks and 0 days who presents at 24-48 hours of upper respiratory symptoms including pressure in her ear is runny nose sinus pressure sore throat and cough. She denies any fevers or chills. She denies any shortness of breath. She reports no sick contacts. She reports no respiratory history.She denies any vaginal bleeding or abnormal discharge. She reports regular fetal movement and no contractions.  OB History    Gravida Para Term Preterm AB Living   1         0   SAB TAB Ectopic Multiple Live Births                  Past Medical History:  Diagnosis Date  . Medical history non-contributory     Past Surgical History:  Procedure Laterality Date  . NO PAST SURGERIES      History reviewed. No pertinent family history.  Social History  Substance Use Topics  . Smoking status: Never Smoker  . Smokeless tobacco: Never Used  . Alcohol use No    Allergies: No Known Allergies  Prescriptions Prior to Admission  Medication Sig Dispense Refill Last Dose  . acetaminophen (TYLENOL) 325 MG tablet Take 325 mg by mouth every 6 (six) hours as needed for mild pain, fever or headache.   11/10/2015 at Unknown time  . Prenatal Vit-Fe Phos-FA-Omega (VITAFOL GUMMIES) 3.33-0.333-34.8 MG CHEW Chew 3 tablets by mouth daily. 90 tablet 12 11/11/2015 at Unknown time  . ranitidine (ZANTAC) 150 MG capsule Take 1 capsule (150 mg total) by mouth 2 (two) times daily as needed for heartburn. (Patient not taking: Reported on 11/11/2015) 30 capsule 2 Not Taking at Unknown time    Review of Systems  Constitutional: Positive for malaise/fatigue. Negative for chills and fever.  HENT: Positive for congestion, ear pain and sore throat.   Eyes: Negative  for blurred vision and double vision.  Respiratory: Positive for cough and sputum production. Negative for shortness of breath.   Cardiovascular: Negative for chest pain and palpitations.  Gastrointestinal: Negative for abdominal pain, heartburn, nausea and vomiting.  Genitourinary: Positive for dysuria. Negative for frequency and urgency.  Musculoskeletal: Positive for back pain. Negative for myalgias and neck pain.  Skin: Negative for itching and rash.  Neurological: Negative for dizziness, tingling, speech change and headaches.  Endo/Heme/Allergies: Negative for environmental allergies. Does not bruise/bleed easily.   Physical Exam   Blood pressure 121/80, pulse 103, temperature 97.8 F (36.6 C), resp. rate 20, height 5\' 1"  (1.549 m), weight 223 lb (101.2 kg), last menstrual period 04/01/2015, SpO2 99 %.  Physical Exam  Constitutional: She is oriented to person, place, and time. She appears well-developed and well-nourished.  HENT:  Head: Normocephalic and atraumatic.  Right Ear: External ear normal.  Left Ear: External ear normal.  Mouth/Throat: Oropharynx is clear and moist.  Clear discharge in the nose, TMs clear bilaterally  Eyes: Conjunctivae are normal. Pupils are equal, round, and reactive to light.  Neck: Normal range of motion. Neck supple.  Cardiovascular: Normal rate, regular rhythm and normal heart sounds.   Respiratory: Breath sounds normal. No respiratory distress. She has no wheezes. She has no rales.  GI:  Soft. Bowel sounds are normal.  Gravid  Musculoskeletal: Normal range of motion.  Neurological: She is alert and oriented to person, place, and time.  Skin: Skin is warm and dry.  Psychiatric: She has a normal mood and affect. Her behavior is normal.    MAU Course  Procedures  MDM In the MAU patient is seen and evaluated. She complains of upper respiratory symptoms for 24-48 hours. She has no other complaints. She is reporting regular fetal movement as well  as no vaginal bleeding. Patient was reassured that this is likely viral. Given the list of appropriate medications in pregnancy and discharged home.  Patient did have a urinalysis performed which was reviewed and negative.  Assessment and Plan  Upper respiratory tract infection: Supportive care rest hydration. Patient was given a list of OTC meds safe in pregnancy.  Ernestina Pennaicholas Schenk 11/11/2015, 12:05 PM

## 2015-11-11 NOTE — MAU Note (Signed)
Onset of cough with productive green sputum since last night, sore throat, denies fever or chills.

## 2015-11-17 ENCOUNTER — Ambulatory Visit (INDEPENDENT_AMBULATORY_CARE_PROVIDER_SITE_OTHER): Payer: Medicaid Other | Admitting: Certified Nurse Midwife

## 2015-11-17 VITALS — BP 124/80 | HR 106 | Temp 98.2°F | Wt 225.0 lb

## 2015-11-17 DIAGNOSIS — Z3401 Encounter for supervision of normal first pregnancy, first trimester: Secondary | ICD-10-CM

## 2015-11-17 DIAGNOSIS — Z3403 Encounter for supervision of normal first pregnancy, third trimester: Secondary | ICD-10-CM

## 2015-11-17 LAB — POCT URINALYSIS DIPSTICK
BILIRUBIN UA: NEGATIVE
Blood, UA: NEGATIVE
Glucose, UA: 250
KETONES UA: NEGATIVE
LEUKOCYTES UA: NEGATIVE
NITRITE UA: NEGATIVE
PH UA: 6
Spec Grav, UA: 1.015
Urobilinogen, UA: 0.2

## 2015-11-17 NOTE — Progress Notes (Signed)
Pt c/o vaginal/rectal pressure

## 2015-11-17 NOTE — Addendum Note (Signed)
Addended by: Lear NgMARTIN, MISTY L on: 11/17/2015 10:37 AM   Modules accepted: Orders

## 2015-11-17 NOTE — Progress Notes (Signed)
Subjective:    Lisa Herrera is a 20 y.o. female being seen today for her obstetrical visit. She is at 6525w6d gestation. Patient reports backache, no bleeding, no leaking, occasional contractions and and vaginal pressure when she changes positions. Fetal movement: normal.  Problem List Items Addressed This Visit    None    Visit Diagnoses   None.    Patient Active Problem List   Diagnosis Date Noted  . Supervision of normal first pregnancy in first trimester 07/04/2015  . Abdominal pain affecting pregnancy, antepartum 05/18/2015   Objective:    BP 124/80   Pulse (!) 106   Temp 98.2 F (36.8 C)   Wt 225 lb (102.1 kg)   LMP 04/01/2015   BMI 42.51 kg/m  FHT:  147 BPM  Uterine Size: 34 cm and size equals dates  Presentation: cephalic     Assessment:    Pregnancy @ 8025w6d weeks   Pelvic pain of pregnancy  Plan:    Rx: mother to be abdominal support belt   labs reviewed, problem list updated Consent signed. GBS planning TDAP offered  Rhogam given for RH negative Pediatrician: discussed. Infant feeding: plans to breastfeed. Maternity leave: N/a. Cigarette smoking: never smoked. No orders of the defined types were placed in this encounter.  No orders of the defined types were placed in this encounter.  Follow up in 2 Weeks.

## 2015-11-24 ENCOUNTER — Encounter: Payer: Self-pay | Admitting: *Deleted

## 2015-12-02 ENCOUNTER — Ambulatory Visit (INDEPENDENT_AMBULATORY_CARE_PROVIDER_SITE_OTHER): Payer: Medicaid Other | Admitting: Certified Nurse Midwife

## 2015-12-02 DIAGNOSIS — Z3403 Encounter for supervision of normal first pregnancy, third trimester: Secondary | ICD-10-CM

## 2015-12-02 NOTE — Progress Notes (Signed)
Subjective:    [name] is a 20 y.o. female being seen today for her obstetrical visit. She is at [redacted]w[redacted]d gestation. Patient reports no bleeding, no leaking and occasional contractions. Fetal movement: normal.  Interpreter present for exam.    Problem List Items Addressed This Visit      Other   Supervision of normal first pregnancy in third trimester    Other Visit Diagnoses   None.    Patient Active Problem List   Diagnosis Date Noted  . Supervision of normal first pregnancy in third trimester 07/04/2015  . Abdominal pain affecting pregnancy, antepartum 05/18/2015   Objective:    BP 123/82   Pulse 73   Temp 98 F (36.7 C)   Wt 235 lb 14.4 oz (107 kg)   LMP 04/01/2015   BMI 44.57 kg/m  FHT:  130 BPM  Uterine Size: 35 cm and size equals dates  Presentation: cephalic   Cervix: 0.5cm, posterior, long, soft, ballotable, vertex.    Assessment:    Pregnancy @ [redacted]w[redacted]d weeks   Maternal obesity  Plan:     labs reviewed, problem list updated Consent signed. GBS sent TDAP offered  Rhogam given for RH negative Pediatrician: discussed. Infant feeding: plans to breastfeed. Maternity leave: N/A. Cigarette smoking: never smoked. No orders of the defined types were placed in this encounter.  No orders of the defined types were placed in this encounter.  Follow up in 1 Week.

## 2015-12-02 NOTE — Progress Notes (Signed)
Pt states that she has pressure that comes and goes along with irregular contractions. Pt stated that she is not having contractions today.

## 2015-12-02 NOTE — Addendum Note (Signed)
Addended by: Marya LandryFOSTER, Soriyah Osberg D on: 12/02/2015 11:42 AM   Modules accepted: Orders

## 2015-12-03 LAB — OB RESULTS CONSOLE GBS: STREP GROUP B AG: POSITIVE

## 2015-12-04 LAB — STREP GP B NAA: Strep Gp B NAA: POSITIVE — AB

## 2015-12-05 ENCOUNTER — Other Ambulatory Visit: Payer: Self-pay | Admitting: Certified Nurse Midwife

## 2015-12-08 LAB — NUSWAB VG+, CANDIDA 6SP
CANDIDA ALBICANS, NAA: NEGATIVE
CANDIDA LUSITANIAE, NAA: NEGATIVE
CANDIDA PARAPSILOSIS, NAA: NEGATIVE
CANDIDA TROPICALIS, NAA: NEGATIVE
CHLAMYDIA TRACHOMATIS, NAA: NEGATIVE
Candida glabrata, NAA: NEGATIVE
Candida krusei, NAA: NEGATIVE
Neisseria gonorrhoeae, NAA: NEGATIVE
TRICH VAG BY NAA: NEGATIVE

## 2015-12-15 ENCOUNTER — Ambulatory Visit (INDEPENDENT_AMBULATORY_CARE_PROVIDER_SITE_OTHER): Payer: Medicaid Other | Admitting: Obstetrics and Gynecology

## 2015-12-15 VITALS — BP 120/72 | HR 101 | Temp 99.0°F | Wt 238.2 lb

## 2015-12-15 DIAGNOSIS — Z3403 Encounter for supervision of normal first pregnancy, third trimester: Secondary | ICD-10-CM

## 2015-12-15 NOTE — Progress Notes (Signed)
   PRENATAL VISIT NOTE  Subjective:  [name] is a 20 y.o. G1P0 at 3479w6d being seen today for ongoing prenatal care.  She is currently monitored for the following issues for this low-risk pregnancy and has Abdominal pain affecting pregnancy, antepartum and Supervision of normal first pregnancy in third trimester on her problem list.  Patient reports no complaints.  Contractions: Irregular. Vag. Bleeding: None.  Movement: Present. Denies leaking of fluid.   The following portions of the patient's history were reviewed and updated as appropriate: allergies, current medications, past family history, past medical history, past social history, past surgical history and problem list. Problem list updated.  Objective:   Vitals:   12/15/15 1556  BP: 120/72  Pulse: (!) 101  Temp: 99 F (37.2 C)  Weight: 238 lb 3.2 oz (108 kg)    Fetal Status: Fetal Heart Rate (bpm): 131 Fundal Height: 37 cm Movement: Present     General:  Alert, oriented and cooperative. Patient is in no acute distress.  Skin: Skin is warm and dry. No rash noted.   Cardiovascular: Normal heart rate noted  Respiratory: Normal respiratory effort, no problems with respiration noted  Abdomen: Soft, gravid, appropriate for gestational age. Pain/Pressure: Present     Pelvic:  Cervical exam deferred        Extremities: Normal range of motion.  Edema: Trace  Mental Status: Normal mood and affect. Normal behavior. Normal judgment and thought content.   Urinalysis: Urine Protein: Trace Urine Glucose: Negative  Assessment and Plan:  Pregnancy: G1P0 at 7479w6d  1. Supervision of normal first pregnancy in third trimester Patient is doing well without complaints GBS results reviewed with the patient Patient declined flu and tdap vaccine  Preterm labor symptoms and general obstetric precautions including but not limited to vaginal bleeding, contractions, leaking of fluid and fetal movement were reviewed in detail with the  patient. Please refer to After Visit Summary for other counseling recommendations.  Return in about 1 week (around 12/22/2015).  Catalina AntiguaPeggy Whitten Andreoni, MD

## 2015-12-15 NOTE — Progress Notes (Signed)
Patient stated that she feels irregular contractions but not daily.

## 2015-12-19 ENCOUNTER — Inpatient Hospital Stay (HOSPITAL_COMMUNITY)
Admission: AD | Admit: 2015-12-19 | Discharge: 2015-12-19 | Disposition: A | Discharge status: Home / Self Care | Payer: Medicaid Other | Source: Ambulatory Visit | Attending: Obstetrics & Gynecology | Admitting: Obstetrics & Gynecology

## 2015-12-19 ENCOUNTER — Encounter (HOSPITAL_COMMUNITY): Payer: Self-pay | Admitting: *Deleted

## 2015-12-19 DIAGNOSIS — Z3493 Encounter for supervision of normal pregnancy, unspecified, third trimester: Secondary | ICD-10-CM | POA: Diagnosis present

## 2015-12-19 DIAGNOSIS — R109 Unspecified abdominal pain: Secondary | ICD-10-CM

## 2015-12-19 DIAGNOSIS — O26899 Other specified pregnancy related conditions, unspecified trimester: Secondary | ICD-10-CM

## 2015-12-19 NOTE — MAU Note (Signed)
Pt reports having ctx on and off for a few weeks. Got stronger today. Denies any vag bleeding or leaking tonight.

## 2015-12-21 ENCOUNTER — Encounter: Payer: Self-pay | Admitting: Obstetrics

## 2015-12-21 ENCOUNTER — Ambulatory Visit (INDEPENDENT_AMBULATORY_CARE_PROVIDER_SITE_OTHER): Payer: Medicaid Other | Admitting: Obstetrics

## 2015-12-21 VITALS — BP 126/84 | HR 89 | Wt 239.0 lb

## 2015-12-21 DIAGNOSIS — O2343 Unspecified infection of urinary tract in pregnancy, third trimester: Secondary | ICD-10-CM

## 2015-12-21 DIAGNOSIS — O3663X1 Maternal care for excessive fetal growth, third trimester, fetus 1: Secondary | ICD-10-CM | POA: Diagnosis not present

## 2015-12-21 DIAGNOSIS — Z3403 Encounter for supervision of normal first pregnancy, third trimester: Secondary | ICD-10-CM

## 2015-12-21 DIAGNOSIS — Z3493 Encounter for supervision of normal pregnancy, unspecified, third trimester: Secondary | ICD-10-CM

## 2015-12-21 DIAGNOSIS — B3731 Acute candidiasis of vulva and vagina: Secondary | ICD-10-CM

## 2015-12-21 DIAGNOSIS — K219 Gastro-esophageal reflux disease without esophagitis: Secondary | ICD-10-CM

## 2015-12-21 DIAGNOSIS — B373 Candidiasis of vulva and vagina: Secondary | ICD-10-CM

## 2015-12-21 MED ORDER — AMOXICILLIN-POT CLAVULANATE 875-125 MG PO TABS: 1.0000 | ORAL_TABLET | Freq: Two times a day (BID) | ORAL | 0 refills | Status: DC

## 2015-12-21 MED ORDER — OMEPRAZOLE 20 MG PO CPDR: 20.0000 mg | DELAYED_RELEASE_CAPSULE | Freq: Two times a day (BID) | ORAL | 5 refills | Status: DC

## 2015-12-21 MED ORDER — TERCONAZOLE 0.8 % VA CREA: 1.0000 | TOPICAL_CREAM | Freq: Every day | VAGINAL | 2 refills | Status: DC

## 2015-12-21 NOTE — Progress Notes (Signed)
Subjective:    Lisa Herrera is a 20 y.o. female being seen today for her obstetrical visit. She is at 8980w5d gestation. Patient reports heartburn and vaginal irritation. Fetal movement: normal.  Problem List Items Addressed This Visit    Supervision of normal first pregnancy in third trimester - Primary    Other Visit Diagnoses    UTI (urinary tract infection) during pregnancy, third trimester       Relevant Medications   amoxicillin-clavulanate (AUGMENTIN) 875-125 MG tablet   LGA (large for gestational age) fetus affecting management of mother, third trimester, fetus 1       Relevant Orders   US MFM OB FOLLOW UP     Patient Active Problem List   Diagnosis Date Noted  . Supervision of normal first pregnancy in third trimester 07/04/2015  . Abdominal pain affecting pregnancy, antepartum 05/18/2015    Objective:    BP 126/84   Pulse 89   Wt 239 lb (108.4 kg)   LMP 04/01/2015   BMI 45.16 kg/m  FHT: 150 BPM  Uterine Size: size greater than dates  Presentations: cephalic    Assessment:    Pregnancy @ 2580w5d weeks   Plan:   Plans for delivery: Vaginal anticipated; labs reviewed; problem list updated Counseling: Consent signed. Infant feeding: plans to breastfeed. Cigarette smoking: never smoked. L&D discussion: symptoms of labor, discussed when to call, discussed what number to call, anesthetic/analgesic options reviewed and delivering clinician:  plans no preference. Postpartum supports and preparation: circumcision discussed and contraception plans discussed.  Follow up in 1 Week.  Patient ID: Lisa Herrera, female   DOB: 11/28/1995, 20 y.o.   MRN: [id number]

## 2015-12-22 ENCOUNTER — Ambulatory Visit (HOSPITAL_COMMUNITY)
Admission: RE | Admit: 2015-12-22 | Discharge: 2015-12-22 | Disposition: A | Discharge status: Home / Self Care | Payer: Medicaid Other | Source: Ambulatory Visit | Attending: Obstetrics | Admitting: Obstetrics

## 2015-12-22 DIAGNOSIS — Z3A37 37 weeks gestation of pregnancy: Secondary | ICD-10-CM | POA: Insufficient documentation

## 2015-12-22 DIAGNOSIS — O3660X0 Maternal care for excessive fetal growth, unspecified trimester, not applicable or unspecified: Secondary | ICD-10-CM | POA: Diagnosis not present

## 2015-12-22 DIAGNOSIS — O3663X1 Maternal care for excessive fetal growth, third trimester, fetus 1: Secondary | ICD-10-CM

## 2015-12-28 ENCOUNTER — Ambulatory Visit (INDEPENDENT_AMBULATORY_CARE_PROVIDER_SITE_OTHER): Payer: Medicaid Other | Admitting: Certified Nurse Midwife

## 2015-12-28 VITALS — BP 125/84 | HR 90 | Temp 98.7°F | Wt 239.3 lb

## 2015-12-28 DIAGNOSIS — Z3403 Encounter for supervision of normal first pregnancy, third trimester: Secondary | ICD-10-CM

## 2015-12-28 NOTE — Progress Notes (Signed)
Subjective:    [name] is a 20 y.o. female being seen today for her obstetrical visit. She is at 8540w5d gestation. Patient reports no complaints. Fetal movement: normal.  Encouraged to start using her Augmentin, was confused about the antibiotics.    Problem List Items Addressed This Visit      Other   Supervision of normal first pregnancy in third trimester - Primary    Other Visit Diagnoses   None.    Patient Active Problem List   Diagnosis Date Noted  . Supervision of normal first pregnancy in third trimester 07/04/2015  . Abdominal pain affecting pregnancy, antepartum 05/18/2015    Objective:    BP 125/84   Pulse 90   Temp 98.7 F (37.1 C)   Wt 239 lb 4.8 oz (108.5 kg)   LMP 04/01/2015   BMI 45.22 kg/m  FHT: 135 BPM  Uterine Size: 39 cm and size equals dates  Presentations: cephalic  Pelvic Exam: deferred     Assessment:    Pregnancy @ 3440w5d weeks   Recent UTI in pregnancy  Plan:   Plans for delivery: Vaginal anticipated; labs reviewed; problem list updated Counseling: Consent signed. Infant feeding: plans to breastfeed. Cigarette smoking: never smoked. L&D discussion: symptoms of labor, discussed when to call, discussed what number to call, anesthetic/analgesic options reviewed and delivering clinician:  plans no preference. Postpartum supports and preparation: circumcision discussed and contraception plans discussed.  Follow up in 1 Week.

## 2015-12-28 NOTE — Patient Instructions (Addendum)
Parto vaginal (Vaginal Delivery) Durante el parto, el mdico la ayudar a dar a luz a su beb. En elparto vaginal, deber pujar para que el beb salga por la vagina. Sin embargo, antes de que pueda sacar al beb, es necesario que ocurran ciertas cosas. La abertura del tero (cuello del tero) tiene que ablandarse, hacerse ms delgado y abrirse (dilatar) hasta que llegue a 10 cm. Adems, el beb tiene que bajar desde el tero a la vagina. SIGNOS DE TRABAJO DE PARTO  El mdico tendr primero que asegurarse de que usted est en trabajo de parto. Algunos signos son:   Eliminar lo que se llama tapn mucoso antes del inicio del trabajo de parto. Este es una pequea cantidad de mucosidad teida con sangre.  Tener contracciones uterinas regulares y dolorosas.   El tiempo entre las contracciones debe acortarse  Las molestias y el dolor se harn ms intensos gradualmente.  El dolor de las contracciones empeora al caminar y no se alivia con el reposo.   El cuello del tero se hace mas delgado (se borra) y se dilata. ANTES DEL PARTO Una vez que se inicie el trabajo de parto y sea admitida en el hospital o sanatorio, el mdico podr hacer lo siguiente:   Realizar un examen fsico.  Controlar si hay complicaciones relacionadas con el trabajo de parto.  Verificar su presin arterial, temperatura y pulso y la frecuencia cardaca (signos vitales).   Determinar si se ha roto el saco amnitico y cundo ha ocurrido.  Realizar un examen vaginal (utilizando un guante estril y un lubricante) para determinar:  La posicin (presentacin) del beb. El beb se presenta con la cabeza primero (vertex) en el canal de parto (vagina), o estn los pies o las nalgas primero (de nalgas)?  El nivel (estacin) de la cabeza del beb dentro del canal de parto.  El borramiento y la dilatacin del cuello uterino  El monitor fetal electrnico generalmente se coloca sobre el abdomen al llegar. Se utiliza para  controlar las contracciones y la frecuencia cardaca del beb.  Cuando el monitor est en el abdomen (monitor fetal externo), slo toma la frecuencia y la duracin de las contracciones. No informa acerca de la intensidad de las contracciones.  Si el mdico necesita saber exactamente la intensidad de las contracciones o cul es la frecuencia cardaca del beb, colocar un monitor interno en la vagina y el tero. El mdico comentar los riesgos y los beneficios de usar un monitor interno y le pedir autorizacin antes de colocar el dispositivo.  El monitoreo fetal continuo ser necesario si le han aplicado una epidural, si le administran ciertos medicamentos (como oxitocina) y si tiene complicaciones del embarazo o del trabajo de parto.  Podrn colocarle una va intravenosa en una vena del brazo para suministrarle lquidos y medicamentos, si es necesario. TRES ETAPAS DEL TRABAJO DE PARTO Y EL PARTO El trabajo de parto y el parto normales se dividen en tres etapas. Primera etapa Esta etapa comienza cuando comienzan las contracciones regulares y el cuello comienza a borrarse y dilatarse. Finaliza cuando el cuello est completamente abierto (completamente dilatado). La primera etapa es la etapa ms larga del trabajo de parto y puede durar desde 3 horas a 15 horas.  Algunos mtodos estn disponibles para ayudar con el dolor del parto. Usted y su mdico decidirn qu opcin es la mejor para usted. Las opciones incluyen:   Medicamentos narcticos. Estos son medicamentos fuertes que usted puede recibir a travs de una va intravenosa o   como inyeccin en el msculo. Estos medicamentos Associate Professoralivian el dolor pero no hacen que desaparezca completamente.  Epidural. Se administra un medicamento a travs de un tubo delgado que se inserta en la espalda. El medicamento adormece la parte inferior del cuerpo y evita el dolor en esa zona.  Bloqueo paracervical Es una inyeccin de un anestsico en cada lado del cuello  uterino.  Usted podr pedir un parto natural, que implica que no se usen analgsicos ni epidural durante el parto y [place] trabajo de [place]o. En cambio, podr tener otro tipo de ayuda como ejercicios respiratorios para hacer frente al [employer]. Segunda etapa La segunda etapa del trabajo de parto comienza cuando el cuello se ha dilatado completamente a 10 cm. Contina hasta que usted puja al beb hacia abajo, por el canal de [place], y el beb nace. Esta etapa puede durar slo algunos minutos o algunas horas.  La posicin del la Turkmenistancabeza del beb a medida que pasa por el canal de parto, es informada como un nmero, llamado estacin. Si la cabeza del beb no ha iniciado su descenso, la estacin se describe como que est en menos 3 (-3). Cuando la cabeza del beb est en la estacin cero, est en el medio del canal de parto y se encaja en la pelvis. La estacin en la que se encuentra el beb indica el progreso de la segunda etapa del Selfridgetrabajo de [place]parto.  Cuando el beb nace, el mdico lo sostendr con la cabeza hacia abajo para evitar que el lquido amnitico, el moco y la sangre entren en los pulmones del beb. La boca y la nariz del beb podrn ser succionadas con un pequeo bulbo para retirar todo lquido adicional.  El mdico podr Scientific laboratory techniciancolocar al beb sobre su estmago. Es importante evitar que el beb tome fro. Para hacerlo, el mdico secar al beb, lo colocar directamente sobre su piel, (sin mantas entre usted y el beb) y lo cubrir con mantas secas y tibias.  Se corta el cordn umbilical. Tercera etapa Durante la tercera etapa del trabajo de parto, el mdico sacar la placenta (alumbramiento) y se asegurar de que el sangrado est controlado. La salida de la placenta generalmente demora 5 minutos pero puede tardar hasta 30 minutos. Luego de la salida de la placenta, le darn un medicamento por va intravenosa o inyectable para ayudar a Engineer, manufacturingcontraer el tero y Air traffic controllercontrolar el sangrado. Si planea amamantar al beb,  puede intentar en este momento Luego de la salida de la placenta, el tero debe contraerse y Lazearquedar muy firme. Si el tero no queda firme, el mdico lo Engineer, maintenance (IT)masajear. Esto es importante debido a que la contraccin del tero ayuda a Location managercortar el sangrado en el sitio en que la placenta estaba unida al tero. Si el tero no se contrae adecuadamente ni Terex Corporationpermanece firme, podr causar un sangrado abundante. Si hay mucho sangrado, podrn darle medicamentos para contraer el tero y Therapist, musicdetener el sangrado.    Esta informacin no tiene Theme park managercomo fin reemplazar el consejo del mdico. Asegrese de hacerle al mdico cualquier pregunta que tenga.   Document Released: 02/23/2008 Document Revised: 04/02/2014 Elsevier Interactive Patient Education 2016 [employer]  Systems analystTercer trimestre de Psychiatristembarazo (Third Trimester of Pregnancy) El tercer trimestre comprende desde la semana29 hasta la semana42, es decir, desde el mes7 hasta el [address]. En este trimestre, el feto crece muy rpido. Hacia el final del noveno mes, el feto mide alrededor de 20pulgadas (45cm) de largo y pesa entre 6y 10libras [phone](2,700y 4,500kg).  CUIDADOS EN  EL HOGAR   No fume, no consuma hierbas ni beba alcohol. No tome frmacos que el mdico no haya autorizado.  No consuma ningn producto que contenga tabaco, lo que incluye cigarrillos, tabaco de Theatre manager o Administrator, Civil Service. Si necesita ayuda para dejar de fumar, consulte al [employer]. Puede recibir asesoramiento u otro tipo de apoyo para dejar de fumar.  Tome los medicamentos solamente como se lo haya indicado el mdico. Algunos medicamentos son seguros para tomar durante el Psychiatrist y otros no lo son.  Haga ejercicios solamente como se lo haya indicado el mdico. Interrumpa la actividad fsica si comienza a tener calambres.  Ingiera alimentos saludables de [place] regular.  Use un sostn que le brinde buen soporte si sus mamas estn sensibles.  No se d baos de inmersin en agua caliente, baos turcos  ni saunas.  Colquese el cinturn de seguridad cuando conduzca.  No coma carne cruda ni queso sin cocinar; evite el contacto con las bandejas sanitarias de los gatos y la tierra que estos animales usan.  Tome las vitaminas prenatales.  Tome entre 1500 y 2000mg  de calcio diariamente comenzando en la semana20 del embarazo [place].  Pruebe tomar un medicamento que la ayude a defecar (un laxante suave) si el mdico lo autoriza. Consuma ms fibra, que se encuentra en las frutas y verduras frescas y los cereales integrales. Beba suficiente lquido para mantener el pis (orina) claro o de color amarillo plido.  Dese baos de asiento con agua tibia para Engineer, materials o las molestias causadas por las hemorroides. Use una crema para las hemorroides si el mdico la autoriza.  Si se le hinchan las venas (venas varicosas), use medias de descanso. Levante (eleve) los pies durante , 3 o 4veces por Futures trader. Limite el consumo de sal en su dieta.  No levante objetos pesados, use zapatos de tacones bajos y sintese derecha.  Descanse con las piernas elevadas si tiene calambres o dolor de cintura.  Visite a su dentista si no lo ha Occupational hygienist. Use un cepillo de cerdas suaves para cepillarse los dientes. Psese el hilo dental con suavidad.  Puede seguir [employer], a menos que el mdico le indique lo contrario.  No haga viajes de larga distancia, excepto si es obligatorio y solamente con la aprobacin del mdico.  Tome clases prenatales.  Practique ir manejando al hospital.  Prepare el bolso que llevar al hospital.  Prepare la habitacin del beb.  Concurra a los controles mdicos. SOLICITE AYUDA SI:  No est segura de si est en trabajo de parto o si ha roto la bolsa de las aguas.  Tiene mareos.  Siente calambres leves o presin en la parte inferior del abdomen.  Sufre un dolor persistente en el abdomen.  Tiene Programme researcher, broadcasting/film/video  (nuseas), vmitos, o tiene deposiciones acuosas (diarrea).  Advierte un olor ftido que proviene de la vagina.  Siente dolor al [employer]. SOLICITE AYUDA DE INMEDIATO SI:   Tiene fiebre.  Tiene una prdida de lquido por la vagina.  Tiene sangrado o pequeas prdidas vaginales.  Siente dolor intenso o clicos en el abdomen.  Sube o baja de peso rpidamente.  Tiene dificultades para recuperar el aliento y siente dolor en el pecho.  Sbitamente se le hinchan mucho el rostro, las Buras, los tobillos, los pies o las piernas.  No ha sentido los movimientos del beb durante Georgianne Fick.  Siente un dolor de cabeza intenso que no se alivia con medicamentos.  Su visin se  modifica.   Esta informacin no tiene Theme park manager el consejo del mdico. Asegrese de hacerle al mdico cualquier pregunta que tenga.   Document Released: 11/12/2012 Document Revised: 04/02/2014 Elsevier Interactive Patient Education [employer]

## 2016-01-04 ENCOUNTER — Ambulatory Visit (INDEPENDENT_AMBULATORY_CARE_PROVIDER_SITE_OTHER): Payer: Medicaid Other | Admitting: Certified Nurse Midwife

## 2016-01-04 VITALS — BP 115/79 | HR 81 | Temp 97.3°F | Wt 241.0 lb

## 2016-01-04 DIAGNOSIS — Z3403 Encounter for supervision of normal first pregnancy, third trimester: Secondary | ICD-10-CM | POA: Diagnosis not present

## 2016-01-04 NOTE — Progress Notes (Signed)
Patient is ready to have her baby. 

## 2016-01-04 NOTE — Progress Notes (Signed)
Subjective:    Lisa JacobsonYvette Herrera is a 20 y.o. female being seen today for her obstetrical visit. She is at 1737w5d gestation. Patient reports no complaints. Fetal movement: normal.  Problem List Items Addressed This Visit      Other   Supervision of normal first pregnancy in third trimester - Primary    Other Visit Diagnoses   None.    Patient Active Problem List   Diagnosis Date Noted  . Supervision of normal first pregnancy in third trimester 07/04/2015  . Abdominal pain affecting pregnancy, antepartum 05/18/2015    Objective:    BP 115/79   Pulse 81   Temp 97.3 F (36.3 C)   Wt 241 lb (109.3 kg)   LMP 04/01/2015   BMI 45.54 kg/m  FHT: 122 BPM  Uterine Size: 40 cm and size equals dates  Presentations: cephalic  Pelvic Exam:              Dilation: outer os 1cm, closed internal       Effacement: Long             Station:  -3    Consistency: firm            Position: posterior     Assessment:    Pregnancy @ 7237w5d weeks   Doing well  Plan:   Plans for delivery: Vaginal anticipated; labs reviewed; problem list updated Counseling: Consent signed. Infant feeding: plans to breastfeed. Cigarette smoking: never smoked. L&D discussion: symptoms of labor, discussed when to call, discussed what number to call, anesthetic/analgesic options reviewed and delivering clinician:  plans no preference. Postpartum supports and preparation: circumcision discussed and contraception plans discussed.  Follow up in 1 Week with NST and schedule IOL if not delivered.

## 2016-01-04 NOTE — Patient Instructions (Addendum)
Antes de la llegada del beb al hogar (Before Baby Comes Home) Antes de que nazca el beb, es importante que se asegure de lo siguiente:  Tener todos los suministros que necesitar para el cuidado del beb.  Saber dnde ir si hay una emergencia.  Haber hablado de la llegada del beb con otros familiares. QU SUMINISTROS NECESITAR? Se recomienda que cuente con los siguientes suministros: Objetos grandes  [place].  Colchn para la cuna.  Asiento de seguridad [employer] atrs para el beb. Si es posible, un profesional capacitado debe garantizar que se haya instalado correctamente. Alimentacin  De 6a 8biberones con una capacidad de 4a 5onzas.  De 6a 8tetinas.  Un cepillo para biberones.  Un esterilizador, o una olla o pava grande con tapa.  Un mtodo para hervir y Financial trader.  Si amamantar:  Un sacaleches.  Crema para los pezones.  Sostn para Museum/gallery exhibitions officer.  Almohadillas para las mamas.  Pezoneras.  Si alimentar al beb con Alexis Goodell maternizada:  Leche maternizada.  Tazas medidoras.  Cucharas medidoras. El bao  Jabn y [place] para beb.  Vaselina.  Un pao y [place].  Una toalla con capucha.  [place].  Un lavatorio o recipiente para baar al beb. Otros suministros  Termmetro rectal.  Pera de goma.  Paos o toallitas para cuando le cambie el paal.  Bolsa para paales.  Cambiador.  Ropa, incluidos conjuntos enterizos y pijamas.  Alicates para bebs.  Mantas para envolver al beb.  Cubrecolchn y sbanas para la cuna.  Luz nocturna para el cuarto del beb.  Monitor para el beb.  De 2 a 3 chupetes.  De 24a 36paales de tela y protectores impermeables o una bolsa de paales descartables. Puede necesitar de 10 a 12paales al da. CMO ME PREPARO PARA UNA EMERGENCIA? Preprese para una emergencia de la siguiente manera:  Sepa cmo llegar al hospital ms cercano.  Tenga una lista de los nmeros  telefnicos de los profesionales que la atienden y que atendern al beb cerca del telfono fijo y en el telfono mvil. CMO PREPARO A MI FAMILIA?  Decida cmo organizar las visitas.  Si tiene otros hijos:  Hable con ellos sobre la llegada del beb al hogar. Pregnteles cmo se sienten.  Lales un libro sobre lo que significa ser un hermano o una hermana mayor.  Encuentre formas de permitirles que la ayuden a prepararse para el nuevo beb.  Tenga a alguien que est listo para cuidarlos cuando usted est en el hospital.   Esta informacin no tiene Theme park manager el consejo del mdico. Asegrese de hacerle al mdico cualquier pregunta que tenga.   Document Released: 06/08/2008 Document Revised: 07/27/2014 Elsevier Interactive Patient Education 2016 [employer]. Systems analyst trimestre de Psychiatrist (Third Trimester of Pregnancy) El tercer trimestre comprende desde la semana29 [employer] semana42, es decir, desde el mes7 hasta el [address] El tercer trimestre es un perodo en el que el feto crece rpidamente. Hacia el final del noveno mes, el feto mide alrededor de 20pulgadas (45cm) de largo y pesa entre 6 y 10 libras (2,700 y 30,500kg).  CAMBIOS EN EL ORGANISMO Su organismo atraviesa por muchos cambios durante el [place], y estos varan de Neomia Dear mujer a Educational psychologist.   Seguir American Standard Companies. Es de esperar que aumente entre 25 y 35libras (11 y 16kg) hacia el final del Psychiatrist.  Podrn aparecer las primeras Alber[employer]on[employer] caderas, el abdomen y las [place].  Puede tener necesidad de Geographical information systems officer con ms frecuencia porque  el feto baja hacia la pelvis y ejerce presin sobre la vejiga.  Debido al Vanetta Mulders podr sentir Anthoney Harada estomacal con frecuencia.  Puede estar estreida, ya que ciertas hormonas enlentecen los movimientos de los msculos que [employer] desechos a travs de los intestinos.  Pueden aparecer hemorroides o abultarse e hincharse las venas (venas varicosas).  Puede sentir dolor  plvico debido al [employer] y a que las hormonas del Management consultant las articulaciones entre los huesos de la pelvis. El dolor de espalda puede ser consecuencia de la sobrecarga de los msculos que soportan la [place].  Tal vez haya cambios en el cabello que pueden incluir su engrosamiento, crecimiento rpido y cambios en la textura. Adems, a algunas mujeres se les cae el cabello durante o despus del embarazo, o tienen el cabello seco o fino. Lo ms probable es que el cabello se le normalice despus del nacimiento del beb.  Las Con[employer]ra Foods seguirn creciendo y Development worker, community. A veces, puede haber una secrecin amarilla de las mamas llamada calostro.  El ombligo puede salir hacia afuera.  Puede sentir que le falta el aire debido a que se expande el tero.  Puede notar que el feto "baja" o lo siente ms bajo, en el abdomen.  Puede tener una prdida de secrecin mucosa con sangre. Esto suele ocurrir en el trmino de unos [address] a una semana antes de que comience el [place] de [place].  El cuello del tero se vuelve delgado y blando (se borra) cerca de la fecha de [place]. QU DEBE ESPERAR EN LOS EXMENES PRENATALES  Le harn exmenes prenatales cada 2semanas hasta la semana36. A partir de ese momento le harn exmenes semanales. Durante una visita prenatal de rutina:  La pesarn para asegurarse de que usted y el feto estn creciendo normalmente.  Le tomarn la presin arterial.  Le medirn el abdomen para controlar el desarrollo del beb.  Se escucharn los latidos cardacos fetales.  Se evaluarn los resultados de los estudios solicitados en visitas anteriores.  Le revisarn el cuello del tero cuando est prxima la fecha de parto para controlar si este se ha borrado. Alrededor de la semana36, el mdico le revisar el cuello del tero. Al mismo tiempo, realizar un anlisis de las secreciones del tejido vaginal. Este examen es para determinar si hay un tipo de bacteria, estreptococo  Grupo B. El mdico le explicar esto con ms detalle. El mdico puede preguntarle lo siguiente:  Cmo le gustara que fuera el [place].  Cmo se siente.  Si siente los movimientos del beb.  Si ha tenido sntomas anormales, como prdida de lquido, [place], dolores de cabeza intensos o clicos abdominales.  Si est consumiendo algn producto que contenga tabaco, como cigarrillos, tabaco de Theatre manager y Administrator, Civil Service.  Si tiene [employer]. Otros exmenes o estudios de deteccin que pueden realizarse durante el tercer trimestre incluyen lo siguiente:  Anlisis de sangre para controlar los niveles de hierro (anemia).  Controles fetales para determinar su salud, nivel de [place] y Designer, jewellery. Si tiene [place] enfermedad o hay problemas durante el embarazo, le harn estudios.  Prueba del VIH (virus de inmunodeficiencia humana). Si corre Chiropodist, pueden realizarle una prueba de deteccin del VIH durante el tercer trimestre del embarazo. FALSO TRABAJO DE PARTO Es posible que sienta contracciones leves e irregulares que finalmente desaparecen. Se llaman contracciones de [address] o falso trabajo de [place]. Las [employer] pueden durar horas, [address] o incluso semanas, antes de que el verdadero [place] de [place]  se inicie. Si las contracciones ocurren a intervalos regulares, se intensifican o se hacen dolorosas, lo mejor es que la revise el mdico.  SIGNOS DE TRABAJO DE PARTO   Clicos de tipo menstrual.  Contracciones cada 5minutos o menos.  Contracciones que comienzan en la parte superior del tero y se extienden hacia abajo, a la zona inferior del abdomen y la espalda.  Sensacin de mayor presin en la pelvis o dolor de espalda.  Una secrecin de mucosidad acuosa o con sangre que sale de la vagina. Si tiene alguno de estos signos antes de la semana37 del Psychiatristembarazo, llame a su mdico de inmediato. Debe concurrir al hospital para que la controlen  inmediatamente. INSTRUCCIONES PARA EL CUIDADO EN EL HOGAR   Evite fumar, consumir hierbas, beber alcohol y tomar frmacos que no le hayan recetado. Estas sustancias qumicas afectan la formacin y el desarrollo del beb.  No consuma ningn producto que contenga tabaco, lo que incluye cigarrillos, tabaco de Theatre managermascar y Administrator, Civil Servicecigarrillos electrnicos. Si necesita ayuda para dejar de fumar, consulte al American Expressmdico. Puede recibir asesoramiento y otro tipo de recursos para dejar de fumar.  Siga las indicaciones del mdico en relacin con el uso de medicamentos. Durante el embarazo, hay medicamentos que son seguros de tomar y otros que no.  Haga ejercicio solamente como se lo haya indicado el mdico. Sentir clicos uterinos es un buen signo para Restaurant manager, fast fooddetener la actividad fsica.  Contine comiendo alimentos sanos con regularidad.  Use un sostn que le brinde buen soporte si le Altria Groupduelen las mamas.  No se d baos de inmersin en agua caliente, baos turcos ni saunas.  Use el cinturn de seguridad en todo momento mientras conduce.  No coma carne cruda ni queso sin cocinar; evite el contacto con las bandejas sanitarias de los gatos y la tierra que estos animales usan. Estos elementos contienen grmenes que pueden causar defectos congnitos en el beb.  Tome las vitaminas prenatales.  Tome entre 1500 y 2000mg  de calcio diariamente comenzando en la semana20 del embarazo Shadelandhasta el parto.  Si est estreida, pruebe un laxante suave (si el mdico lo autoriza). Consuma ms alimentos ricos en fibra, como vegetales y frutas frescos y Radiation protection practitionercereales integrales. Beba gran cantidad de lquido para mantener la orina de tono claro o color amarillo plido.  Dese baos de asiento con agua tibia para Engineer, materialsaliviar el dolor o las molestias causadas por las hemorroides. Use una crema para las hemorroides si el mdico la autoriza.  Si tiene venas varicosas, use medias de descanso. Eleve los pies durante 15minutos, 3 o 4veces por da. Limite el  consumo de sal en su dieta.  Evite levantar objetos pesados, use zapatos de tacones bajos y [place]mantenga una buena postura.  Descanse con las piernas elevadas si tiene calambres o dolor de cintura.  Visite a su dentista si no lo ha Occupational hygienisthecho durante el embarazo. Use un cepillo de dientes blando para higienizarse los dientes y psese el hilo dental con suavidad.  Puede seguir Calpine Corporationmanteniendo relaciones sexuales, a menos que el mdico le indique lo contrario.  No haga viajes largos excepto que sea absolutamente necesario y solo con la autorizacin del mdico.  Tome clases prenatales para Financial traderentender, Education administratorpracticar y hacer preguntas sobre el [place] Arbortrabajo de parto y Rayneel parto.  Haga un ensayo de la partida al hospital.  Prepare el bolso que llevar al hospital.  Prepare la habitacin del beb.  Concurra a todas las visitas prenatales segn las indicaciones de su mdico. SOLICITE ATENCIN MDICA SI:  No est segura de que est en trabajo de parto o de que ha roto la bolsa de las aguas.  Tiene mareos.  Siente clicos leves, presin en la pelvis o dolor persistente en el abdomen.  Tiene nuseas, vmitos o diarrea persistentes.  Brett Fairy secrecin vaginal con mal olor.  Siente dolor al [employer]oco[employer]illips. SOLICITE ATENCIN MDICA DE INMEDIATO SI:   Tiene fiebre.  Tiene una prdida de lquido por la vagina.  Tiene sangrado o pequeas prdidas vaginales.  Siente dolor intenso o clicos en el abdomen.  Sube o baja de peso rpidamente.  Tiene dificultad para respirar y siente dolor de pecho.  Sbitamente se le hinchan mucho el rostro, las [place], los tobillos, los pies o las piernas.  No ha sentido los movimientos del beb durante Georgianne Fick.  Siente un dolor de cabeza intenso que no se alivia con medicamentos.  Su visin se modifica.   Esta informacin no tiene Theme park manager el consejo del mdico. Asegrese de hacerle al mdico cualquier pregunta que tenga.   Document Released: 12/20/2004 Document  Revised: 04/02/2014 Elsevier Interactive Patient Education 2016 [employer].  Parto vaginal (Vaginal Delivery) Durante el parto, el mdico la ayudar a dar a luz a su beb. En elparto vaginal, deber pujar para que el beb salga por la vagina. Sin embargo, antes de que pueda sacar al beb, es necesario que ocurran ciertas cosas. La abertura del tero (cuello del tero) tiene que ablandarse, hacerse ms delgado y abrirse (dilatar) hasta que llegue a 10 cm. Adems, el beb tiene que bajar desde el tero a la vagina. SIGNOS DE TRABAJO DE PARTO  El mdico tendr primero que asegurarse de que usted est en [place]. Algunos signos son:   Eliminar lo que se llama tapn mucoso antes del inicio del trabajo de [place]. Este es una pequea cantidad de mucosidad teida con sangre.  Tener contracciones uterinas regulares y dolorosas.   El Bank [employer] America las contracciones debe acortarse  Las molestias y Chief Technology Officer se harn ms intensos gradualmente.  El dolor de las contracciones empeora al caminar y no se alivia con el reposo.   El cuello del tero se hace mas delgado (se borra) y se dilata. ANTES DEL PARTO Una vez que se inicie el trabajo de parto y sea admitida en el hospital o sanatorio, el mdico podr hacer lo siguiente:   Education officer, environmental un examen fsico.  Controlar si hay complicaciones relacionadas con Kathie Dike de parto.  Verificar su presin arterial, temperatura y pulso y la frecuencia cardaca (signos vitales).   Determinar si se ha roto el saco amnitico y cundo ha ocurrido.  Realizar un examen vaginal (utilizando un guante estril y un lubricante) para determinar:  La posicin (presentacin) del beb. El beb se presenta con la cabeza primero (vertex) en el canal de parto (vagina), o estn los pies o las nalgas primero (de nalgas)?  El nivel (estacin) de la cabeza del beb dentro del canal de parto.  El borramiento y la dilatacin del cuello uterino  El monitor fetal  electrnico generalmente se coloca sobre el abdomen al Environmental health practitioner. Se utiliza para controlar las contracciones y la frecuencia cardaca del beb.  Cuando el monitor est en el abdomen (monitor fetal externo), slo toma la frecuencia y la duracin de las contracciones. No informa acerca de la intensidad de las contracciones.  Si el mdico necesita saber exactamente la intensidad de las contracciones o cul es la frecuencia cardaca del beb, colocar un monitor interno  en la vagina y el tero. El mdico Liz Claiborne riesgos y los beneficios de usar un monitor interno y le pedir autorizacin antes de Scientist, product/process development dispositivo.  El monitoreo fetal continuo ser necesario si le han aplicado una epidural, si le administran ciertos medicamentos (como oxitocina) y si tiene complicaciones del [place] o del trabajo de [place].  Podrn colocarle una va intravenosa en una vena del brazo para suministrarle lquidos y medicamentos, si es necesario. TRES ETAPAS DEL TRABAJO DE PARTO Y EL PARTO El [place] de parto y el parto normales se dividen en tres etapas. Primera etapa Esta etapa comienza cuando comienzan las contracciones regulares y el cuello comienza a borrarse y dilatarse. Finaliza cuando el cuello est completamente abierto (completamente dilatado). La primera etapa es la etapa ms larga del [place] de parto y puede durar desde 3 horas a 15 horas.  Algunos mtodos estn disponibles para ayudar con el dolor del [place]. Usted y su mdico decidirn qu opcin es la mejor para usted. Las opciones incluyen:   Medicamentos narcticos. Estos son medicamentos fuertes que usted puede recibir a Games developer de una va intravenosa o como inyeccin en el msculo. Estos medicamentos Associate Professor pero no hacen que desaparezca completamente.  Epidural. Se administra un medicamento a travs de un tubo delgado que se inserta en la espalda. El medicamento adormece la parte inferior del cuerpo y evita el dolor en esa  zona.  Bloqueo paracervical Es una inyeccin de un anestsico en cada lado del cuello uterino.  Usted podr pedir un parto natural, que implica que no se usen analgsicos ni epidural durante el parto y [place] de [place]. En cambio, podr tener otro tipo de ayuda como ejercicios respiratorios para hacer frente al [employer]ck & [employer]. Segunda etapa La segunda etapa del trabajo de parto comienza cuando el cuello se ha dilatado completamente a 10 cm. Contina hasta que usted puja al beb hacia abajo, por el canal de [place], y el beb nace. Esta etapa puede durar slo algunos minutos o algunas horas.  La posicin del la [place] del beb a medida que pasa por el canal de parto, es informada como un nmero, llamado estacin. Si la cabeza del beb no ha iniciado su descenso, la estacin se describe como que est en menos 3 (-3). Cuando la cabeza del beb est en la estacin cero, est en el medio del canal de parto y se encaja en la pelvis. La estacin en la que se encuentra el beb indica el progreso de la segunda etapa del [place].  Cuando el beb nace, el mdico lo sostendr con la cabeza hacia abajo para evitar que el lquido amnitico, el moco y la sangre entren en los pulmones del beb. La boca y la nariz del beb podrn ser succionadas con un pequeo bulbo para retirar todo lquido adicional.  El mdico podr Scientific laboratory technician al beb sobre su estmago. Es importante evitar que el beb tome fro. Para hacerlo, el mdico secar al beb, lo colocar directamente sobre su piel, (sin mantas entre usted y el beb) y lo cubrir con mantas secas y tibias.  Se corta el cordn umbilical. Tercera etapa Durante la tercera etapa del trabajo de parto, el mdico sacar la placenta (alumbramiento) y se asegurar de que el sangrado est controlado. La salida de la placenta generalmente demora 5 minutos pero puede tardar hasta 30 minutos. Luego de la salida de la placenta, le darn un medicamento por va intravenosa o inyectable  para ayudar a Engineer, manufacturing  tero y Air traffic controller. Si planea amamantar al beb, puede intentar en este momento Luego de la salida de la placenta, el tero debe contraerse y [place]. Si el tero no queda firme, el mdico lo Engineer, maintenance (IT). Esto es importante debido a que la contraccin del tero ayuda a Location manager sangrado en el sitio en que la placenta estaba unida al tero. Si el tero no se contrae adecuadamente ni Terex Corporation, podr causar un sangrado abundante. Si hay mucho sangrado, podrn darle medicamentos para contraer el tero y Therapist, music.    Esta informacin no tiene Theme park manager el consejo del mdico. Asegrese de hacerle al mdico cualquier pregunta que tenga.   Document Released: 02/23/2008 Document Revised: 04/02/2014 Elsevier Interactive Patient Education [employer]

## 2016-01-12 ENCOUNTER — Ambulatory Visit (INDEPENDENT_AMBULATORY_CARE_PROVIDER_SITE_OTHER): Payer: Medicaid Other | Admitting: Certified Nurse Midwife

## 2016-01-12 VITALS — BP 130/85 | HR 76 | Wt 246.0 lb

## 2016-01-12 DIAGNOSIS — B951 Streptococcus, group B, as the cause of diseases classified elsewhere: Secondary | ICD-10-CM

## 2016-01-12 DIAGNOSIS — Z283 Underimmunization status: Secondary | ICD-10-CM | POA: Diagnosis not present

## 2016-01-12 DIAGNOSIS — Z3403 Encounter for supervision of normal first pregnancy, third trimester: Secondary | ICD-10-CM | POA: Diagnosis not present

## 2016-01-12 DIAGNOSIS — O09899 Supervision of other high risk pregnancies, unspecified trimester: Secondary | ICD-10-CM

## 2016-01-12 DIAGNOSIS — Z2839 Other underimmunization status: Secondary | ICD-10-CM | POA: Insufficient documentation

## 2016-01-12 NOTE — Progress Notes (Signed)
Subjective:    Lisa Herrera is a 20 y.o. female being seen today for her obstetrical visit. She is at 709w6d gestation. Patient reports no complaints and lower leg edema, denies HA, blood pressure slightly elevated from baseline. Fetal movement: normal.  Problem List Items Addressed This Visit    None    Visit Diagnoses   None.    Patient Active Problem List   Diagnosis Date Noted  . Supervision of normal first pregnancy in third trimester 07/04/2015  . Abdominal pain affecting pregnancy, antepartum 05/18/2015    Objective:    BP 130/85   Pulse 76   Wt 246 lb (111.6 kg)   LMP 04/01/2015   BMI 46.48 kg/m  FHT:  125 BPM  Uterine Size: 42 cm and size greater than dates  Presentation: cephalic  Pelvic Exam: deferred    NST: + accels, no decels, moderate variability, Cat. 1 tracing. No contractions on toco.   Assessment:    Pregnancy @ 759w6d  weeks   bilateral lower leg edema  Reactive NST  obesity  Plan:    Postdates management: discussed fetal surveillance and induction, discussed fetal movement, NST reactive, biophysical profile ordered. Induction: scheduled for 01/13/16, written information given.  Follow up in 2 weeks postpartum.

## 2016-01-13 ENCOUNTER — Inpatient Hospital Stay (HOSPITAL_COMMUNITY)
Admission: RE | Admit: 2016-01-13 | Discharge: 2016-01-17 | DRG: 765 | Disposition: A | Discharge status: Home / Self Care | Payer: Medicaid Other | Source: Ambulatory Visit | Attending: Obstetrics & Gynecology | Admitting: Obstetrics & Gynecology

## 2016-01-13 ENCOUNTER — Inpatient Hospital Stay (HOSPITAL_COMMUNITY): Payer: Medicaid Other | Admitting: Anesthesiology

## 2016-01-13 ENCOUNTER — Encounter (HOSPITAL_COMMUNITY): Payer: Self-pay

## 2016-01-13 VITALS — BP 115/61 | HR 97 | Temp 98.1°F | Resp 18 | Ht 61.0 in | Wt 246.0 lb

## 2016-01-13 DIAGNOSIS — O99214 Obesity complicating childbirth: Secondary | ICD-10-CM | POA: Diagnosis present

## 2016-01-13 DIAGNOSIS — O480 Post-term pregnancy: Secondary | ICD-10-CM | POA: Diagnosis present

## 2016-01-13 DIAGNOSIS — O99824 Streptococcus B carrier state complicating childbirth: Secondary | ICD-10-CM | POA: Diagnosis present

## 2016-01-13 DIAGNOSIS — Z6841 Body Mass Index (BMI) 40.0 and over, adult: Secondary | ICD-10-CM

## 2016-01-13 DIAGNOSIS — Z3A41 41 weeks gestation of pregnancy: Secondary | ICD-10-CM | POA: Diagnosis not present

## 2016-01-13 DIAGNOSIS — O26899 Other specified pregnancy related conditions, unspecified trimester: Secondary | ICD-10-CM

## 2016-01-13 DIAGNOSIS — O324XX0 Maternal care for high head at term, not applicable or unspecified: Secondary | ICD-10-CM | POA: Diagnosis present

## 2016-01-13 DIAGNOSIS — R109 Unspecified abdominal pain: Secondary | ICD-10-CM

## 2016-01-13 DIAGNOSIS — Z98891 History of uterine scar from previous surgery: Secondary | ICD-10-CM

## 2016-01-13 LAB — SYPHILIS: RPR W/REFLEX TO RPR TITER AND TREPONEMAL ANTIBODIES, TRADITIONAL SCREENING AND DIAGNOSIS ALGORITHM: RPR Ser Ql: NONREACTIVE

## 2016-01-13 LAB — COMPREHENSIVE METABOLIC PANEL
ALBUMIN: 2.5 g/dL — AB (ref 3.5–5.0)
ALK PHOS: 146 U/L — AB (ref 38–126)
ALT: 11 U/L — ABNORMAL LOW (ref 14–54)
AST: 20 U/L (ref 15–41)
Anion gap: 11 (ref 5–15)
BILIRUBIN TOTAL: 0.5 mg/dL (ref 0.3–1.2)
BUN: 10 mg/dL (ref 6–20)
CALCIUM: 8.7 mg/dL — AB (ref 8.9–10.3)
CO2: 18 mmol/L — ABNORMAL LOW (ref 22–32)
Chloride: 106 mmol/L (ref 101–111)
Creatinine, Ser: 0.52 mg/dL (ref 0.44–1.00)
GFR calc Af Amer: >60 mL/min (ref >60)
GLUCOSE: 120 mg/dL — AB (ref 65–99)
POTASSIUM: 3.8 mmol/L (ref 3.5–5.1)
Sodium: 135 mmol/L (ref 135–145)
TOTAL PROTEIN: 5.8 g/dL — AB (ref 6.5–8.1)

## 2016-01-13 LAB — URINALYSIS, ROUTINE W REFLEX MICROSCOPIC
Bilirubin Urine: NEGATIVE
GLUCOSE, UA: NEGATIVE mg/dL
Ketones, ur: NEGATIVE mg/dL
LEUKOCYTES UA: NEGATIVE
Nitrite: NEGATIVE
PH: 7 (ref 5.0–8.0)
Protein, ur: >300 mg/dL — AB
SPECIFIC GRAVITY, URINE: 1.02 (ref 1.005–1.030)

## 2016-01-13 LAB — RAPID URINE DRUG SCREEN, HOSP PERFORMED
Amphetamines: NOT DETECTED
Barbiturates: NOT DETECTED
Benzodiazepines: NOT DETECTED
Cocaine: NOT DETECTED
Opiates: NOT DETECTED
Tetrahydrocannabinol: NOT DETECTED

## 2016-01-13 LAB — URINE MICROSCOPIC-ADD ON: WBC UA: NONE SEEN WBC/hpf (ref 0–5)

## 2016-01-13 LAB — PROTEIN / CREATININE RATIO, URINE
Creatinine, Urine: 164 mg/dL
Protein Creatinine Ratio: 5.54 mg/mg{creat} — ABNORMAL HIGH (ref 0.00–0.15)
Total Protein, Urine: 909 mg/dL

## 2016-01-13 LAB — CBC
HCT: 36 % (ref 36.0–46.0)
Hemoglobin: 11.9 g/dL — ABNORMAL LOW (ref 12.0–15.0)
MCH: 27.7 pg (ref 26.0–34.0)
MCHC: 33.1 g/dL (ref 30.0–36.0)
MCV: 83.7 fL (ref 78.0–100.0)
Platelets: 218 10*3/uL (ref 150–400)
RBC: 4.3 MIL/uL (ref 3.87–5.11)
RDW: 15.7 % — ABNORMAL HIGH (ref 11.5–15.5)
WBC: 9.4 10*3/uL (ref 4.0–10.5)

## 2016-01-13 MED ORDER — OXYTOCIN 40 UNITS IN LACTATED RINGERS INFUSION - SIMPLE MED
1.0000 m[IU]/min | INTRAVENOUS | Status: DC
  Administered 2016-01-13: 2 m[IU]/min via INTRAVENOUS
  Filled 2016-01-13: qty 1000

## 2016-01-13 MED ORDER — PENICILLIN G POTASSIUM 5000000 UNITS IJ SOLR
2.5000 10*6.[IU] | INTRAVENOUS | Status: DC
  Administered 2016-01-14 (×2): 2.5 10*6.[IU] via INTRAVENOUS
  Filled 2016-01-13 (×4): qty 2.5

## 2016-01-13 MED ORDER — TERBUTALINE SULFATE 1 MG/ML IJ SOLN: 0.2500 mg | Freq: Once | INTRAMUSCULAR | Status: DC | PRN

## 2016-01-13 MED ORDER — LACTATED RINGERS IV SOLN: 500.0000 mL | INTRAVENOUS | Status: DC | PRN

## 2016-01-13 MED ORDER — MISOPROSTOL 25 MCG QUARTER TABLET
25.0000 ug | ORAL_TABLET | ORAL | Status: DC
  Administered 2016-01-13: 25 ug via VAGINAL
  Filled 2016-01-13 (×2): qty 0.25

## 2016-01-13 MED ORDER — EPHEDRINE 5 MG/ML INJ: 10.0000 mg | INTRAVENOUS | Status: DC | PRN

## 2016-01-13 MED ORDER — ONDANSETRON HCL 4 MG/2ML IJ SOLN
4.0000 mg | Freq: Four times a day (QID) | INTRAMUSCULAR | Status: DC | PRN
  Administered 2016-01-14: 4 mg via INTRAVENOUS
  Filled 2016-01-13: qty 2

## 2016-01-13 MED ORDER — LIDOCAINE HCL (PF) 1 % IJ SOLN: 30.0000 mL | INTRAMUSCULAR | Status: DC | PRN

## 2016-01-13 MED ORDER — OXYCODONE-ACETAMINOPHEN 5-325 MG PO TABS: 1.0000 | ORAL_TABLET | ORAL | Status: DC | PRN

## 2016-01-13 MED ORDER — OXYCODONE-ACETAMINOPHEN 5-325 MG PO TABS: 2.0000 | ORAL_TABLET | ORAL | Status: DC | PRN

## 2016-01-13 MED ORDER — PHENYLEPHRINE 40 MCG/ML (10ML) SYRINGE FOR IV PUSH (FOR BLOOD PRESSURE SUPPORT)
80.0000 ug | PREFILLED_SYRINGE | INTRAVENOUS | Status: DC | PRN
  Filled 2016-01-13 (×3): qty 10

## 2016-01-13 MED ORDER — FENTANYL CITRATE (PF) 100 MCG/2ML IJ SOLN
100.0000 ug | INTRAMUSCULAR | Status: DC | PRN
  Filled 2016-01-13: qty 2

## 2016-01-13 MED ORDER — DEXTROSE 5 % IV SOLN
2.5000 10*6.[IU] | INTRAVENOUS | Status: DC
  Filled 2016-01-13 (×4): qty 2.5

## 2016-01-13 MED ORDER — DIPHENHYDRAMINE HCL 50 MG/ML IJ SOLN: 12.5000 mg | INTRAMUSCULAR | Status: DC | PRN

## 2016-01-13 MED ORDER — LACTATED RINGERS IV SOLN
INTRAVENOUS | Status: DC
  Administered 2016-01-13 – 2016-01-14 (×6): via INTRAVENOUS

## 2016-01-13 MED ORDER — FENTANYL 2.5 MCG/ML BUPIVACAINE 1/10 % EPIDURAL INFUSION (WH - ANES)
14.0000 mL/h | INTRAMUSCULAR | Status: DC | PRN
  Administered 2016-01-13 – 2016-01-14 (×5): 14 mL/h via EPIDURAL
  Filled 2016-01-13 (×5): qty 125

## 2016-01-13 MED ORDER — PENICILLIN G POTASSIUM 5000000 UNITS IJ SOLR
5.0000 10*6.[IU] | Freq: Once | INTRAVENOUS | Status: AC
  Administered 2016-01-13: 5 10*6.[IU] via INTRAVENOUS
  Filled 2016-01-13: qty 5

## 2016-01-13 MED ORDER — PENICILLIN G POTASSIUM 5000000 UNITS IJ SOLR: 2.5000 10*6.[IU] | INTRAVENOUS | Status: DC

## 2016-01-13 MED ORDER — LIDOCAINE HCL (PF) 1 % IJ SOLN
INTRAMUSCULAR | Status: DC | PRN
  Administered 2016-01-13 (×2): 5 mL

## 2016-01-13 MED ORDER — FLEET ENEMA 7-19 GM/118ML RE ENEM: 1.0000 | ENEMA | Freq: Every day | RECTAL | Status: DC | PRN

## 2016-01-13 MED ORDER — MISOPROSTOL 25 MCG QUARTER TABLET
25.0000 ug | ORAL_TABLET | ORAL | Status: DC | PRN
  Administered 2016-01-13: 25 ug via VAGINAL

## 2016-01-13 MED ORDER — OXYTOCIN 40 UNITS IN LACTATED RINGERS INFUSION - SIMPLE MED
2.5000 [IU]/h | INTRAVENOUS | Status: DC
  Administered 2016-01-14: 2.5 [IU]/h via INTRAVENOUS

## 2016-01-13 MED ORDER — LACTATED RINGERS IV SOLN
500.0000 mL | Freq: Once | INTRAVENOUS | Status: AC
  Administered 2016-01-13: 500 mL via INTRAVENOUS

## 2016-01-13 MED ORDER — PENICILLIN G POTASSIUM 5000000 UNITS IJ SOLR
5.0000 10*6.[IU] | Freq: Once | INTRAMUSCULAR | Status: DC
  Filled 2016-01-13: qty 5

## 2016-01-13 MED ORDER — PENICILLIN G POTASSIUM 5000000 UNITS IJ SOLR: 5.0000 10*6.[IU] | Freq: Once | INTRAVENOUS | Status: DC

## 2016-01-13 MED ORDER — ACETAMINOPHEN 325 MG PO TABS
650.0000 mg | ORAL_TABLET | ORAL | Status: DC | PRN
  Administered 2016-01-13: 650 mg via ORAL
  Filled 2016-01-13: qty 2

## 2016-01-13 MED ORDER — SOD CITRATE-CITRIC ACID 500-334 MG/5ML PO SOLN
30.0000 mL | ORAL | Status: DC | PRN
  Administered 2016-01-14: 30 mL via ORAL
  Filled 2016-01-13: qty 15

## 2016-01-13 MED ORDER — OXYTOCIN 10 UNIT/ML IJ SOLN: 10.0000 [IU] | Freq: Once | INTRAMUSCULAR | Status: DC

## 2016-01-13 MED ORDER — OXYTOCIN BOLUS FROM INFUSION: 500.0000 mL | Freq: Once | INTRAVENOUS | Status: DC

## 2016-01-13 NOTE — H&P (Signed)
LABOR AND DELIVERY ADMISSION HISTORY AND PHYSICAL NOTE  Lisa Herrera is a 20 y.o. female G1P0 with IUP at 4019w0d by LMP and 6 week US presenting for post dates induction of labor.  She is feeling well, is nervous about upcoming labor. Is comfortable right now, denies contractions. Family at bedside, patient declined interpreter.  She reports positive fetal movement. She denies leakage of fluid or vaginal bleeding.   Past Medical History: Past Medical History:  Diagnosis Date  . Medical history non-contributory     Past Surgical History: Past Surgical History:  Procedure Laterality Date  . NO PAST SURGERIES      Obstetrical History: OB History    Gravida Para Term Preterm AB Living   1         0   SAB TAB Ectopic Multiple Live Births                  Social History: Social History   Social History  . Marital status: Married    Spouse name: N/A  . Number of children: N/A  . Years of education: N/A   Social History Main Topics  . Smoking status: Never Smoker  . Smokeless tobacco: Never Used  . Alcohol use No  . Drug use: No  . Sexual activity: Yes    Birth control/ protection: None   Other Topics Concern  . None   Social History Narrative  . None    Family History: History reviewed. No pertinent family history.  Allergies: No Known Allergies  Prescriptions Prior to Admission  Medication Sig Dispense Refill Last Dose  . omeprazole (PRILOSEC) 20 MG capsule Take 1 capsule (20 mg total) by mouth 2 (two) times daily before a meal. 60 capsule 5 Past Month at Unknown time  . Prenatal Vit-Fe Phos-FA-Omega (VITAFOL GUMMIES) 3.33-0.333-34.8 MG CHEW Chew 3 tablets by mouth daily. 90 tablet 12 01/12/2016 at Unknown time  . ranitidine (ZANTAC) 150 MG capsule TAKE 1 CAPSULE (150 MG TOTAL) BY MOUTH 2 (TWO) TIMES DAILY AS NEEDED FOR HEARTBURN.  2 Past Week at Unknown time  . terconazole (TERAZOL 3) 0.8 % vaginal cream Place 1 applicator vaginally at bedtime. 20 g 2 Past  Week at Unknown time  . acetaminophen (TYLENOL) 325 MG tablet Take 325 mg by mouth every 6 (six) hours as needed for mild pain, fever or headache.   prn at Unknown time  . amoxicillin-clavulanate (AUGMENTIN) 875-125 MG tablet Take 1 tablet by mouth 2 (two) times daily. (Patient not taking: Reported on 01/13/2016) 14 tablet 0 Completed Course at Unknown time     Review of Systems   All systems reviewed and negative except as stated in HPI  Blood pressure (!) 148/98, pulse 89, temperature 97.9 F (36.6 C), temperature source Axillary, resp. rate 18, height 5\' 1"  (1.549 m), weight 111.6 kg (246 lb), last menstrual period 04/01/2015. General appearance: alert, cooperative and no distress Lungs: no respiratory distress Heart: regular rate Abdomen: soft, non-tender Extremities: No calf swelling or tenderness Presentation: cephalic by nurse exam Fetal monitoring: baseline 125 moderate variability, accelerations present, no decelerations Uterine activity: none by toco Dilation: 1 Effacement (%): Thick Station: Ballotable Exam by:: M.Merrill, RN   Prenatal labs: ABO, Rh: --/--/O POS (10/20 0753) Antibody: NEG (10/20 0753) Rubella: immune RPR: Non Reactive (07/13 1130)  HBsAg: Negative (04/07 1615)  HIV: Non Reactive (07/13 1130)  GBS: Positive (09/08 1335)   Prenatal Transfer Tool  Maternal Diabetes: No Genetic Screening: Normal Maternal Ultrasounds/Referrals: Normal Fetal  Ultrasounds or other Referrals:  Referred to Materal Fetal Medicine  Maternal Substance Abuse:  No Significant Maternal Medications:  None Significant Maternal Lab Results: None  Results for orders placed or performed during the hospital encounter of 01/13/16 (from the past 24 hour(s))  CBC   Collection Time: 01/13/16  7:53 AM  Result Value Ref Range   WBC 9.4 4.0 - 10.5 K/uL   RBC 4.30 3.87 - 5.11 MIL/uL   Hemoglobin 11.9 (L) 12.0 - 15.0 g/dL   HCT 40.9 81.1 - 91.4 %   MCV 83.7 78.0 - 100.0 fL   MCH  27.7 26.0 - 34.0 pg   MCHC 33.1 30.0 - 36.0 g/dL   RDW 78.2 (H) 95.6 - 21.3 %   Platelets 218 150 - 400 K/uL  Comprehensive metabolic panel   Collection Time: 01/13/16  7:53 AM  Result Value Ref Range   Sodium 135 135 - 145 mmol/L   Potassium 3.8 3.5 - 5.1 mmol/L   Chloride 106 101 - 111 mmol/L   CO2 18 (L) 22 - 32 mmol/L   Glucose, Bld 120 (H) 65 - 99 mg/dL   BUN 10 6 - 20 mg/dL   Creatinine, Ser 0.86 0.44 - 1.00 mg/dL   Calcium 8.7 (L) 8.9 - 10.3 mg/dL   Total Protein 5.8 (L) 6.5 - 8.1 g/dL   Albumin 2.5 (L) 3.5 - 5.0 g/dL   AST 20 15 - 41 U/L   ALT 11 (L) 14 - 54 U/L   Alkaline Phosphatase 146 (H) 38 - 126 U/L   Total Bilirubin 0.5 0.3 - 1.2 mg/dL   GFR calc non Af Amer >60 >60 mL/min   GFR calc Af Amer >60 >60 mL/min   Anion gap 11 5 - 15  Type and screen   Collection Time: 01/13/16  7:53 AM  Result Value Ref Range   ABO/RH(D) O POS    Antibody Screen NEG    Sample Expiration 01/16/2016     Patient Active Problem List   Diagnosis Date Noted  . Encounter for trial of labor 01/13/2016  . Positive GBS test 01/12/2016  . Maternal varicella, non-immune 01/12/2016  . Supervision of normal first pregnancy in third trimester 07/04/2015  . Abdominal pain affecting pregnancy, antepartum 05/18/2015    Assessment: Lisa Herrera is a 20 y.o. G1P0 at [redacted]w[redacted]d here for post dates IOL  #Labor:IOL, cytotec started will augment as needed #Pain: Desires epidural #FWB: Category 1 tracing #ID:  GBS positive- PCN ordered #MOF: breast #MOC:POP  #Circ:  n/a  Tillman Sers, DO PGY-1 10/20/20179:25 AM   OB FELLOW HISTORY AND PHYSICAL ATTESTATION  I have seen and examined this patient; I agree with above documentation in the resident's note.  Initial BP elevated, will repeat, if continues to be elevated, will rule out preeclampsia with labs.   Jen Mow, DO OB Fellow 01/13/2016, 9:59 AM

## 2016-01-13 NOTE — Anesthesia Procedure Notes (Signed)
Epidural Patient location during procedure: OB  Staffing Anesthesiologist: Camari Wisham Performed: anesthesiologist   Preanesthetic Checklist Completed: patient identified, site marked, surgical consent, pre-op evaluation, timeout performed, IV checked, risks and benefits discussed and monitors and equipment checked  Epidural Patient position: sitting Prep: DuraPrep Patient monitoring: heart rate, continuous pulse ox and blood pressure Approach: right paramedian Location: L3-L4 Injection technique: LOR saline  Needle:  Needle type: Tuohy  Needle gauge: 17 G Needle length: 9 cm and 9 Needle insertion depth: 8 cm Catheter type: closed end flexible Catheter size: 20 Guage Catheter at skin depth: 12 cm Test dose: negative  Assessment Events: blood not aspirated, injection not painful, no injection resistance, negative IV test and no paresthesia  Additional Notes Patient identified. Risks/Benefits/Options discussed with patient including but not limited to bleeding, infection, nerve damage, paralysis, failed block, incomplete pain control, headache, blood pressure changes, nausea, vomiting, reactions to medication both or allergic, itching and postpartum back pain. Confirmed with bedside nurse the patient's most recent platelet count. Confirmed with patient that they are not currently taking any anticoagulation, have any bleeding history or any family history of bleeding disorders. Patient expressed understanding and wished to proceed. All questions were answered. Sterile technique was used throughout the entire procedure. Please see nursing notes for vital signs. Test dose was given through epidural needle and negative prior to continuing to dose epidural or start infusion. Warning signs of high block given to the patient including shortness of breath, tingling/numbness in hands, complete motor block, or any concerning symptoms with instructions to call for help. Patient was given  instructions on fall risk and not to get out of bed. All questions and concerns addressed with instructions to call with any issues.     

## 2016-01-13 NOTE — Progress Notes (Signed)
Patient was seen and evaluated at the bedside. Patient with minimal contractions on the strip. Most recent cervical exam of 4.5,  70%, -2, and soft with a Bishop score of 7. Patient is comfortable at this time. Cytotec was administered at 1621 with a plan to initiate Pitocin at 2021. Category 1 tracing

## 2016-01-13 NOTE — Anesthesia Preprocedure Evaluation (Signed)
Anesthesia Evaluation  Patient identified by MRN, date of birth, ID band Patient awake    Reviewed: Allergy & Precautions, H&P , NPO status , Patient's Chart, lab work & pertinent test results  History of Anesthesia Complications Negative for: history of anesthetic complications  Airway Mallampati: II  TM Distance: >3 FB Neck ROM: full    Dental no notable dental hx. (+) Teeth Intact   Pulmonary neg pulmonary ROS,    Pulmonary exam normal breath sounds clear to auscultation       Cardiovascular negative cardio ROS Normal cardiovascular exam Rhythm:regular Rate:Normal     Neuro/Psych negative neurological ROS  negative psych ROS   GI/Hepatic negative GI ROS, Neg liver ROS,   Endo/Other  Morbid obesity  Renal/GU negative Renal ROS  negative genitourinary   Musculoskeletal   Abdominal   Peds  Hematology negative hematology ROS (+)   Anesthesia Other Findings   Reproductive/Obstetrics (+) Pregnancy                             Anesthesia Physical Anesthesia Plan  ASA: II  Anesthesia Plan: Epidural   Post-op Pain Management:    Induction:   Airway Management Planned:   Additional Equipment:   Intra-op Plan:   Post-operative Plan:   Informed Consent: I have reviewed the patients History and Physical, chart, labs and discussed the procedure including the risks, benefits and alternatives for the proposed anesthesia with the patient or authorized representative who has indicated his/her understanding and acceptance.     Plan Discussed with:   Anesthesia Plan Comments:         Anesthesia Quick Evaluation  

## 2016-01-13 NOTE — Progress Notes (Addendum)
Patient was seen and evaluated at the bedside. Patient feels discomfort but no pain at this time, no bleeding on exam. Patient feels baby moving. Patient was educated on likely epidural placement after foley bulb is expelled from cervix. Fetal heart tracing is category 1. Foley bulb remains snug in place.  Upon 8 month chart review, no elevated blood pressures have been documented.

## 2016-01-13 NOTE — Anesthesia Pain Management Evaluation Note (Signed)
  CRNA Pain Management Visit Note  Patient: Lisa JacobsonYvette Herrera, 20 y.o., female  "Hello I am a member of the anesthesia team at [facility]. We have an anesthesia team available at all times to provide care throughout the hospital, including epidural management and anesthesia for C-section. I don't know your plan for the delivery whether it a natural birth, water birth, IV sedation, nitrous supplementation, doula or epidural, but we want to meet your pain goals."   1.Was your pain managed to your expectations on prior hospitalizations?     na  2.What is your expectation for pain management during this hospitalization?     epidural  3.How can we help you reach that goal? Epidural prn  Record the patient's initial score and the patient's pain goal.   Pain: 0  Pain Goal:5  [facility] you to be able to say your pain was always managed very well.  Agh Laveen LLCWRINKLE,Lisa Herrera 01/13/2016

## 2016-01-14 ENCOUNTER — Encounter (HOSPITAL_COMMUNITY)
Admission: RE | Disposition: A | Discharge status: Home / Self Care | Payer: Self-pay | Source: Ambulatory Visit | Attending: Obstetrics & Gynecology

## 2016-01-14 ENCOUNTER — Encounter (HOSPITAL_COMMUNITY): Payer: Self-pay

## 2016-01-14 DIAGNOSIS — O99824 Streptococcus B carrier state complicating childbirth: Secondary | ICD-10-CM

## 2016-01-14 DIAGNOSIS — O324XX0 Maternal care for high head at term, not applicable or unspecified: Secondary | ICD-10-CM

## 2016-01-14 DIAGNOSIS — O480 Post-term pregnancy: Secondary | ICD-10-CM

## 2016-01-14 DIAGNOSIS — Z3A41 41 weeks gestation of pregnancy: Secondary | ICD-10-CM

## 2016-01-14 LAB — PROTEIN / CREATININE RATIO, URINE
CREATININE, URINE: 113 mg/dL
PROTEIN CREATININE RATIO: 2.78 mg/mg{creat} — AB (ref 0.00–0.15)
TOTAL PROTEIN, URINE: 314 mg/dL

## 2016-01-14 SURGERY — Surgical Case
Anesthesia: Regional | Site: Abdomen

## 2016-01-14 MED ORDER — SOD CITRATE-CITRIC ACID 500-334 MG/5ML PO SOLN
ORAL | Status: AC
  Administered 2016-01-14: 30 mL via ORAL
  Filled 2016-01-14: qty 15

## 2016-01-14 MED ORDER — SCOPOLAMINE 1 MG/3DAYS TD PT72
MEDICATED_PATCH | TRANSDERMAL | Status: AC
  Filled 2016-01-14: qty 1

## 2016-01-14 MED ORDER — DIBUCAINE 1 % RE OINT: 1.0000 "application " | TOPICAL_OINTMENT | RECTAL | Status: DC | PRN

## 2016-01-14 MED ORDER — KETOROLAC TROMETHAMINE 30 MG/ML IJ SOLN: 30.0000 mg | Freq: Four times a day (QID) | INTRAMUSCULAR | Status: AC | PRN

## 2016-01-14 MED ORDER — COCONUT OIL OIL: 1.0000 "application " | TOPICAL_OIL | Status: DC | PRN

## 2016-01-14 MED ORDER — ACETAMINOPHEN 500 MG PO TABS
1000.0000 mg | ORAL_TABLET | Freq: Four times a day (QID) | ORAL | Status: DC | PRN
Start: 2016-01-14 — End: 2016-01-14
  Administered 2016-01-14: 1000 mg via ORAL
  Filled 2016-01-14 (×2): qty 2

## 2016-01-14 MED ORDER — NALBUPHINE HCL 10 MG/ML IJ SOLN: 5.0000 mg | INTRAMUSCULAR | Status: DC | PRN

## 2016-01-14 MED ORDER — FENTANYL CITRATE (PF) 100 MCG/2ML IJ SOLN
INTRAMUSCULAR | Status: AC
  Filled 2016-01-14: qty 2

## 2016-01-14 MED ORDER — SODIUM CHLORIDE 0.9% FLUSH: 3.0000 mL | INTRAVENOUS | Status: DC | PRN

## 2016-01-14 MED ORDER — ACETAMINOPHEN 500 MG PO TABS
1000.0000 mg | ORAL_TABLET | Freq: Once | ORAL | Status: AC
Start: 2016-01-14 — End: 2016-01-14
  Administered 2016-01-14: 1000 mg via ORAL
  Filled 2016-01-14: qty 2

## 2016-01-14 MED ORDER — SENNOSIDES-DOCUSATE SODIUM 8.6-50 MG PO TABS
2.0000 | ORAL_TABLET | ORAL | Status: DC
  Administered 2016-01-15 – 2016-01-17 (×3): 2 via ORAL
  Filled 2016-01-14 (×3): qty 2

## 2016-01-14 MED ORDER — DEXTROSE 5 % IV SOLN
170.0000 mg | Freq: Three times a day (TID) | INTRAVENOUS | Status: DC
  Administered 2016-01-14: 170 mg via INTRAVENOUS
  Filled 2016-01-14 (×2): qty 4.25

## 2016-01-14 MED ORDER — FERROUS SULFATE 325 (65 FE) MG PO TABS
325.0000 mg | ORAL_TABLET | Freq: Two times a day (BID) | ORAL | Status: DC
  Administered 2016-01-15 – 2016-01-17 (×5): 325 mg via ORAL
  Filled 2016-01-14 (×5): qty 1

## 2016-01-14 MED ORDER — PIPERACILLIN-TAZOBACTAM 3.375 G IVPB 30 MIN
3.3750 g | Freq: Three times a day (TID) | INTRAVENOUS | Status: AC
  Administered 2016-01-14 – 2016-01-15 (×2): 3.375 g via INTRAVENOUS
  Filled 2016-01-14 (×4): qty 50

## 2016-01-14 MED ORDER — NALOXONE HCL 2 MG/2ML IJ SOSY
1.0000 ug/kg/h | PREFILLED_SYRINGE | INTRAVENOUS | Status: DC | PRN
  Filled 2016-01-14: qty 2

## 2016-01-14 MED ORDER — OXYTOCIN 40 UNITS IN LACTATED RINGERS INFUSION - SIMPLE MED: 2.5000 [IU]/h | INTRAVENOUS | Status: AC

## 2016-01-14 MED ORDER — OXYTOCIN 10 UNIT/ML IJ SOLN
INTRAMUSCULAR | Status: AC
  Filled 2016-01-14: qty 4

## 2016-01-14 MED ORDER — MORPHINE SULFATE (PF) 0.5 MG/ML IJ SOLN
INTRAMUSCULAR | Status: DC | PRN
  Administered 2016-01-14: 1 mg via INTRAVENOUS
  Administered 2016-01-14: 4 mg via EPIDURAL

## 2016-01-14 MED ORDER — MAGNESIUM HYDROXIDE 400 MG/5ML PO SUSP: 30.0000 mL | ORAL | Status: DC | PRN

## 2016-01-14 MED ORDER — MORPHINE SULFATE (PF) 0.5 MG/ML IJ SOLN
INTRAMUSCULAR | Status: AC
  Filled 2016-01-14: qty 10

## 2016-01-14 MED ORDER — MEASLES, MUMPS & RUBELLA VAC ~~LOC~~ INJ
0.5000 mL | INJECTION | Freq: Once | SUBCUTANEOUS | Status: DC
  Filled 2016-01-14: qty 0.5

## 2016-01-14 MED ORDER — ONDANSETRON HCL 4 MG/2ML IJ SOLN
INTRAMUSCULAR | Status: AC
  Filled 2016-01-14: qty 2

## 2016-01-14 MED ORDER — SODIUM CHLORIDE 0.9 % IV SOLN
2.0000 g | Freq: Four times a day (QID) | INTRAVENOUS | Status: DC
  Administered 2016-01-14 (×2): 2 g via INTRAVENOUS
  Filled 2016-01-14 (×4): qty 2000

## 2016-01-14 MED ORDER — ACETAMINOPHEN 325 MG PO TABS: 650.0000 mg | ORAL_TABLET | ORAL | Status: DC | PRN

## 2016-01-14 MED ORDER — LACTATED RINGERS IV SOLN
INTRAVENOUS | Status: DC | PRN
  Administered 2016-01-14: 19:00:00 via INTRAVENOUS

## 2016-01-14 MED ORDER — FENTANYL CITRATE (PF) 100 MCG/2ML IJ SOLN
25.0000 ug | INTRAMUSCULAR | Status: DC | PRN
  Administered 2016-01-14: 50 ug via INTRAVENOUS

## 2016-01-14 MED ORDER — METHYLERGONOVINE MALEATE 0.2 MG/ML IJ SOLN
INTRAMUSCULAR | Status: AC
  Filled 2016-01-14: qty 1

## 2016-01-14 MED ORDER — OXYCODONE-ACETAMINOPHEN 5-325 MG PO TABS
2.0000 | ORAL_TABLET | ORAL | Status: DC | PRN
  Administered 2016-01-16 – 2016-01-17 (×3): 2 via ORAL
  Filled 2016-01-14 (×3): qty 2

## 2016-01-14 MED ORDER — MENTHOL 3 MG MT LOZG: 1.0000 | LOZENGE | OROMUCOSAL | Status: DC | PRN

## 2016-01-14 MED ORDER — ZOLPIDEM TARTRATE 5 MG PO TABS: 5.0000 mg | ORAL_TABLET | Freq: Every evening | ORAL | Status: DC | PRN

## 2016-01-14 MED ORDER — FENTANYL CITRATE (PF) 100 MCG/2ML IJ SOLN
INTRAMUSCULAR | Status: DC | PRN
  Administered 2016-01-14 (×2): 50 ug via INTRAVENOUS
  Administered 2016-01-14 (×2): 100 ug via EPIDURAL

## 2016-01-14 MED ORDER — OXYCODONE-ACETAMINOPHEN 5-325 MG PO TABS
1.0000 | ORAL_TABLET | ORAL | Status: DC | PRN
  Administered 2016-01-16: 1 via ORAL
  Filled 2016-01-14: qty 1

## 2016-01-14 MED ORDER — BUPIVACAINE HCL (PF) 0.5 % IJ SOLN
INTRAMUSCULAR | Status: AC
  Filled 2016-01-14: qty 30

## 2016-01-14 MED ORDER — TETANUS-DIPHTH-ACELL PERTUSSIS 5-2.5-18.5 LF-MCG/0.5 IM SUSP: 0.5000 mL | Freq: Once | INTRAMUSCULAR | Status: DC

## 2016-01-14 MED ORDER — NALOXONE HCL 0.4 MG/ML IJ SOLN: 0.4000 mg | INTRAMUSCULAR | Status: DC | PRN

## 2016-01-14 MED ORDER — METHYLERGONOVINE MALEATE 0.2 MG/ML IJ SOLN
INTRAMUSCULAR | Status: DC | PRN
  Administered 2016-01-14: 0.2 mg via INTRAMUSCULAR

## 2016-01-14 MED ORDER — MEPERIDINE HCL 25 MG/ML IJ SOLN: 6.2500 mg | INTRAMUSCULAR | Status: DC | PRN

## 2016-01-14 MED ORDER — SODIUM BICARBONATE 8.4 % IV SOLN
INTRAVENOUS | Status: DC | PRN
  Administered 2016-01-14 (×3): 5 mL via EPIDURAL
  Administered 2016-01-14: 2 mL via EPIDURAL
  Administered 2016-01-14 (×3): 5 mL via EPIDURAL
  Administered 2016-01-14: 1 mL via EPIDURAL
  Administered 2016-01-14: 2 mL via EPIDURAL
  Administered 2016-01-14: 3 mL via EPIDURAL

## 2016-01-14 MED ORDER — ONDANSETRON HCL 4 MG/2ML IJ SOLN: 4.0000 mg | Freq: Three times a day (TID) | INTRAMUSCULAR | Status: DC | PRN

## 2016-01-14 MED ORDER — SIMETHICONE 80 MG PO CHEW: 80.0000 mg | CHEWABLE_TABLET | ORAL | Status: DC | PRN

## 2016-01-14 MED ORDER — ACETAMINOPHEN 500 MG PO TABS
1000.0000 mg | ORAL_TABLET | Freq: Four times a day (QID) | ORAL | Status: AC
  Administered 2016-01-15 (×3): 1000 mg via ORAL
  Filled 2016-01-14 (×3): qty 2

## 2016-01-14 MED ORDER — IBUPROFEN 600 MG PO TABS
600.0000 mg | ORAL_TABLET | Freq: Four times a day (QID) | ORAL | Status: DC
  Administered 2016-01-15 – 2016-01-17 (×9): 600 mg via ORAL
  Filled 2016-01-14 (×9): qty 1

## 2016-01-14 MED ORDER — OXYTOCIN 10 UNIT/ML IJ SOLN
INTRAVENOUS | Status: DC | PRN
  Administered 2016-01-14: 40 [IU] via INTRAVENOUS

## 2016-01-14 MED ORDER — LACTATED RINGERS IV SOLN
INTRAVENOUS | Status: DC | PRN
  Administered 2016-01-14 (×5): via INTRAVENOUS

## 2016-01-14 MED ORDER — WITCH HAZEL-GLYCERIN EX PADS: 1.0000 "application " | MEDICATED_PAD | CUTANEOUS | Status: DC | PRN

## 2016-01-14 MED ORDER — DEXTROSE 5 % IV SOLN
180.0000 mg | Freq: Once | INTRAVENOUS | Status: AC
  Administered 2016-01-14: 180 mg via INTRAVENOUS
  Filled 2016-01-14: qty 4.5

## 2016-01-14 MED ORDER — DIPHENHYDRAMINE HCL 25 MG PO CAPS: 25.0000 mg | ORAL_CAPSULE | Freq: Four times a day (QID) | ORAL | Status: DC | PRN

## 2016-01-14 MED ORDER — OXYTOCIN 40 UNITS IN LACTATED RINGERS INFUSION - SIMPLE MED
INTRAVENOUS | Status: AC
Start: 2016-01-14 — End: 2016-01-15
  Filled 2016-01-14: qty 1000

## 2016-01-14 MED ORDER — SIMETHICONE 80 MG PO CHEW
80.0000 mg | CHEWABLE_TABLET | ORAL | Status: DC
  Administered 2016-01-15 – 2016-01-17 (×2): 80 mg via ORAL
  Filled 2016-01-14 (×3): qty 1

## 2016-01-14 MED ORDER — LACTATED RINGERS IV SOLN
INTRAVENOUS | Status: DC
  Administered 2016-01-15: 18:00:00 via INTRAVENOUS

## 2016-01-14 MED ORDER — ONDANSETRON HCL 4 MG/2ML IJ SOLN
INTRAMUSCULAR | Status: DC | PRN
  Administered 2016-01-14: 4 mg via INTRAVENOUS

## 2016-01-14 MED ORDER — DIPHENHYDRAMINE HCL 50 MG/ML IJ SOLN: 12.5000 mg | INTRAMUSCULAR | Status: DC | PRN

## 2016-01-14 MED ORDER — DIPHENHYDRAMINE HCL 25 MG PO CAPS: 25.0000 mg | ORAL_CAPSULE | ORAL | Status: DC | PRN

## 2016-01-14 MED ORDER — BUPIVACAINE HCL (PF) 0.5 % IJ SOLN
INTRAMUSCULAR | Status: DC | PRN
  Administered 2016-01-14: 30 mL

## 2016-01-14 MED ORDER — NALBUPHINE HCL 10 MG/ML IJ SOLN: 5.0000 mg | Freq: Once | INTRAMUSCULAR | Status: DC | PRN

## 2016-01-14 MED ORDER — PRENATAL MULTIVITAMIN CH
1.0000 | ORAL_TABLET | Freq: Every day | ORAL | Status: DC
  Administered 2016-01-15 – 2016-01-16 (×2): 1 via ORAL
  Filled 2016-01-14 (×2): qty 1

## 2016-01-14 MED ORDER — SCOPOLAMINE 1 MG/3DAYS TD PT72
MEDICATED_PATCH | TRANSDERMAL | Status: DC | PRN
  Administered 2016-01-14: 1 via TRANSDERMAL

## 2016-01-14 MED ORDER — PROMETHAZINE HCL 25 MG/ML IJ SOLN: 6.2500 mg | INTRAMUSCULAR | Status: DC | PRN

## 2016-01-14 MED ORDER — PIPERACILLIN-TAZOBACTAM 3.375 G IVPB 30 MIN: 3.3750 g | Freq: Three times a day (TID) | INTRAVENOUS | Status: DC

## 2016-01-14 MED ORDER — CLINDAMYCIN PHOSPHATE 900 MG/50ML IV SOLN
900.0000 mg | Freq: Once | INTRAVENOUS | Status: AC
  Administered 2016-01-14: 900 mg via INTRAVENOUS
  Filled 2016-01-14: qty 50

## 2016-01-14 SURGICAL SUPPLY — 39 items
BENZOIN TINCTURE PRP APPL 2/3 (GAUZE/BANDAGES/DRESSINGS) ×3 IMPLANT
CHLORAPREP W/TINT 26ML (MISCELLANEOUS) ×3 IMPLANT
CLAMP CORD UMBIL (MISCELLANEOUS) IMPLANT
CLOSURE WOUND 1/2 X4 (GAUZE/BANDAGES/DRESSINGS) ×1
CLOTH BEACON ORANGE TIMEOUT ST (SAFETY) ×3 IMPLANT
DRSG OPSITE POSTOP 4X10 (GAUZE/BANDAGES/DRESSINGS) ×3 IMPLANT
ELECT REM PT RETURN 9FT ADLT (ELECTROSURGICAL) ×3
ELECTRODE REM PT RTRN 9FT ADLT (ELECTROSURGICAL) ×1 IMPLANT
EXTRACTOR VACUUM M CUP 4 TUBE (SUCTIONS) IMPLANT
EXTRACTOR VACUUM M CUP 4' TUBE (SUCTIONS)
GLOVE BIOGEL PI IND STRL 7.0 (GLOVE) ×3 IMPLANT
GLOVE BIOGEL PI INDICATOR 7.0 (GLOVE) ×6
GLOVE ECLIPSE 7.0 STRL STRAW (GLOVE) ×3 IMPLANT
GOWN STRL REUS W/TWL LRG LVL3 (GOWN DISPOSABLE) ×6 IMPLANT
HEMOSTAT SURGICEL 2X14 (HEMOSTASIS) ×3 IMPLANT
KIT ABG SYR 3ML LUER SLIP (SYRINGE) IMPLANT
NEEDLE HYPO 22GX1.5 SAFETY (NEEDLE) ×3 IMPLANT
NEEDLE HYPO 25X5/8 SAFETYGLIDE (NEEDLE) ×3 IMPLANT
NS IRRIG 1000ML POUR BTL (IV SOLUTION) ×3 IMPLANT
PACK C SECTION WH (CUSTOM PROCEDURE TRAY) ×3 IMPLANT
PAD ABD 7.5X8 STRL (GAUZE/BANDAGES/DRESSINGS) ×3 IMPLANT
PAD ABD 8X10 STRL (GAUZE/BANDAGES/DRESSINGS) ×3 IMPLANT
PAD OB MATERNITY 4.3X12.25 (PERSONAL CARE ITEMS) ×3 IMPLANT
PENCIL SMOKE EVAC W/HOLSTER (ELECTROSURGICAL) ×3 IMPLANT
RTRCTR C-SECT PINK 25CM LRG (MISCELLANEOUS) IMPLANT
SPONGE DRAIN TRACH 4X4 STRL 2S (GAUZE/BANDAGES/DRESSINGS) ×6 IMPLANT
SPONGE GAUZE 4X4 12PLY (GAUZE/BANDAGES/DRESSINGS) ×3 IMPLANT
SPONGE LAP 18X18 X RAY DECT (DISPOSABLE) ×3 IMPLANT
STRIP CLOSURE SKIN 1/2X4 (GAUZE/BANDAGES/DRESSINGS) ×2 IMPLANT
SUT PDS AB 0 CTX 36 PDP370T (SUTURE) ×3 IMPLANT
SUT PLAIN 2 0 XLH (SUTURE) IMPLANT
SUT VIC AB 0 CTX 36 (SUTURE) ×6
SUT VIC AB 0 CTX36XBRD ANBCTRL (SUTURE) ×3 IMPLANT
SUT VIC AB 2-0 CT1 27 (SUTURE) ×2
SUT VIC AB 2-0 CT1 TAPERPNT 27 (SUTURE) ×1 IMPLANT
SUT VIC AB 4-0 KS 27 (SUTURE) ×3 IMPLANT
SYR CONTROL 10ML LL (SYRINGE) ×3 IMPLANT
TOWEL OR 17X24 6PK STRL BLUE (TOWEL DISPOSABLE) ×3 IMPLANT
TRAY FOLEY CATH SILVER 14FR (SET/KITS/TRAYS/PACK) ×3 IMPLANT

## 2016-01-14 NOTE — Progress Notes (Signed)
Pt tells sister she is still having pain but is sleeping and is often unaware of current contractions.

## 2016-01-14 NOTE — Progress Notes (Signed)
ANTIBIOTIC CONSULT NOTE - INITIAL  Pharmacy Consult for Gentamicin Indication: Chorioamnionitis   No Known Allergies  Patient Measurements: Height: 5\' 1"  (154.9 cm) Weight: 246 lb (111.6 kg) IBW/kg (Calculated) : 47.8 Adjusted Body Weight: 67.0 kg  Vital Signs: Temp: 101.7 F (38.7 C) (10/21 0524) Temp Source: Oral (10/21 0524) BP: 135/71 (10/21 0530) Pulse Rate: 105 (10/21 0530)  Labs:  Recent Labs  01/13/16 0753 01/13/16 1445  WBC 9.4  --   HGB 11.9*  --   PLT 218  --   LABCREA  --  164.00  CREATININE 0.52  --    No results for input(s): GENTTROUGH, GENTPEAK, GENTRANDOM in the last 72 hours.   Microbiology: No results found for this or any previous visit (from the past 720 hour(s)).  Medications:  Penicillin protocol for GBS prophylaxis  Ampicillin 2 Gm IV every 6 hours  Assessment: 20 y.o. female G1P0 at 3555w1d admitted for IOL due to postdates; now with increased temperature and presumed chorioamnionitis Estimated Ke = 0.38, Vd = 26.8 Liters  Goal of Therapy:  Gentamicin peak 6-8 mg/L and Trough < 1 mg/L  Plan:  Gentamicin 180 mg IV x 1  Gentamicin 170 mg IV every 8 hrs  Check Scr with next labs if gentamicin continued. Will check gentamicin levels if continued > 72hr or clinically indicated.  Arelia SneddonMason, Niza Soderholm Anne 01/14/2016,6:12 AM

## 2016-01-14 NOTE — Progress Notes (Signed)
Offered to call anesthesia to dose epidural but pt states she did not like how she felt after epidural was redosed last time by anesthesia. Reminded pt and family to let RN know if she changed her mind.

## 2016-01-14 NOTE — Progress Notes (Signed)
Dr Berton Lanshank and Wallene HuhMarta, spanish interpreter at bs. Family had questions answered. Sve. 7/thin/0. Will continue to increase pitocin and reevaluate in 4 hours. Pt offered redose on epidural by anesthesia. Pt declined but will let RN know if she changes her mind or needs Spanish interpreter called to bedside.

## 2016-01-14 NOTE — Transfer of Care (Signed)
Immediate Anesthesia Transfer of Care Note  Patient: Lisa JacobsonYvette Herrera  Procedure(s) Performed: Procedure(s): CESAREAN SECTION (N/A)  Patient Location: PACU  Anesthesia Type:Epidural  Level of Consciousness: awake  Airway & Oxygen Therapy: Patient Spontanous Breathing  Post-op Assessment: Report given to RN and Post -op Vital signs reviewed and stable  Post vital signs: stable  Last Vitals:  Vitals:   01/14/16 1828 01/14/16 1835  BP: (!) 142/72   Pulse: (!) 128   Resp:  18  Temp:      Last Pain:  Vitals:   01/14/16 1825  TempSrc:   PainSc: 0-No pain         Complications: No apparent anesthesia complications

## 2016-01-14 NOTE — Progress Notes (Signed)
PT seen and evaluated. Have trouble with pain control. Frustrated with her situation having not dialated since 8PM last night. Pt 7/100/0 at this time. Seems to be room in pelvis. Minimal caput. Pt has IUPC in place and has not been adequate. DOes have chorio is on antibiotics. Will plan on recheck in 4 hours if no significant change will plan for PLTC for arrest of dilation. Spoke with pt and she is in agree ment with plan.

## 2016-01-14 NOTE — Progress Notes (Signed)
Pt doing well. Tired. Pain controlled. Now with tripple I on Abx since 5AM. IUPC in place. MVU's appoximately 100 in . Not adequate at thsi time. Contineu to monitor. Category 1 tracing.

## 2016-01-14 NOTE — Op Note (Signed)
Lisa Herrera Zelman PROCEDURE DATE: 01/14/2016  PREOPERATIVE DIAGNOSES: Intrauterine pregnancy at 5850w1d weeks gestation; failure to progress: arrest of descent  POSTOPERATIVE DIAGNOSES: The same  PROCEDURE: Primary Low Transverse Cesarean Section  SURGEON:  Dr. Jaynie CollinsUgonna Ottilie Wigglesworth  ASSISTANT:  Dr. Ernestina PennaNicholas Schenk  INDICATIONS: Lisa Herrera Fouty is a [age] y.o. G1P1001 at 7250w1d here for cesarean section secondary to the indications listed under preoperative diagnoses; please see preoperative note for further details.  The risks of cesarean section were discussed with the patient including but were not limited to: bleeding which may require transfusion or reoperation; infection which may require antibiotics; injury to bowel, bladder, ureters or other surrounding organs; injury to the fetus; need for additional procedures including hysterectomy in the event of a life-threatening hemorrhage; placental abnormalities wth subsequent pregnancies, incisional problems, thromboembolic phenomenon and other postoperative/anesthesia complications.   The patient concurred with the proposed plan, giving informed written consent for the procedure.    FINDINGS:  Viable female infant in cephalic presentation.  Apgars 8 and 9.   Thick meconium in amniotic fluid.  Intact placenta, three vessel cord.  Normal uterus, fallopian tubes and ovaries bilaterally. There was extension of the hysterotomy to right uterine vessels leading to increased blood loss, modified O'Leary stitch placed to help with hemostasis.   ANESTHESIA: Spinal INTRAVENOUS FLUIDS: 3000 ml ESTIMATED BLOOD LOSS: 1500 ml URINE OUTPUT:  150 ml SPECIMENS: Placenta sent to pathology COMPLICATIONS: None immediate  PROCEDURE IN DETAIL:  The patient preoperatively received intravenous antibiotics and had sequential compression devices applied to her lower extremities.  She was then taken to the operating room where spinal anesthesia was administered and was found to be  adequate. She was then placed in a dorsal supine position with a leftward tilt, and prepped and draped in a sterile manner.  A foley catheter was placed into her bladder and attached to constant gravity.  After an adequate timeout was performed, a Pfannenstiel skin incision was made with scalpel and carried through to the underlying layer of fascia. The fascia was incised in the midline, and this incision was extended bilaterally using the Mayo scissors.  Kocher clamps were applied to the superior aspect of the fascial incision and the underlying rectus muscles were dissected off bluntly.  A similar process was carried out on the inferior aspect of the fascial incision. The rectus muscles were separated in the midline bluntly and the peritoneum was entered bluntly. Attention was turned to the lower uterine segment where a low transverse hysterotomy was made with a scalpel and extended bilaterally bluntly.  The infant was successfully delivered, the cord was clamped and cut after one minute, and the infant was handed over to the awaiting neonatology team. Uterine massage was then administered, and the placenta delivered intact with a three-vessel cord. The uterus was then cleared of clots and debris.  The hysterotomy was closed with 0 Vicryl in a running locked fashion, and an imbricating layer was also placed with 0 Vicryl.  There was significant bleeding from right uterine vessels; this was successfully ligated with 0 Vicryl figure-of-eight stitches.  The pelvis was cleared of all clot and debris. Hemostasis was confirmed on all surfaces.  The peritoneum was closed with a 2-0 Vicryl running stitch. The fascia was then closed using 0 PDS in a running fashion.  The subcutaneous layer was irrigated, then reapproximated with 2-0 plain gut interrupted stitches, and 30 ml of 0.5% Marcaine was injected subcutaneously around the incision.  The skin was closed with a 4-0 Vicryl  subcuticular stitch. The patient tolerated the  procedure well. Sponge, lap, instrument and needle counts were correct x 3.  She was taken to the recovery room in stable condition.    Jaynie Collins, MD, FACOG Attending Obstetrician & Gynecologist Faculty Practice, [facility]

## 2016-01-14 NOTE — Progress Notes (Signed)
Pt seen and evaluated. Has had protracted labor. At last evaluation patient had dilated to 10 cm. Attempted pushing at that time and fetal heart rate dropped to the 100s. Rested at that time and allowed to labor down. No progress with laboring down for 1.5 hours. Attempted pushing again and heart rate decreased to  100s. At this time will proceed with c-section.   The risks of cesarean section discussed with the patient included but were not limited to: bleeding which may require transfusion or reoperation; infection which may require antibiotics; injury to bowel, bladder, ureters or other surrounding organs; injury to the fetus; need for additional procedures including hysterectomy in the event of a life-threatening hemorrhage; placental abnormalities wth subsequent pregnancies, incisional problems, thromboembolic phenomenon and other postoperative/anesthesia complications. The patient concurred with the proposed plan, giving informed written consent for the procedure.   Anesthesia and OR aware. Preoperative prophylactic antibiotics and SCDs ordered on call to the OR.  To OR when ready.  Lorne SkeensNicholas Michael Schenk, MD

## 2016-01-14 NOTE — Progress Notes (Signed)
Patient ID: [name], female   DOB: 12-01-1995, 20 y.o.   MRN: [id number]  Assessed patient for progress. Patient uncomfortable, with epidural, feeling contractions.  Discussed IUPC to be placed, agreed with plan.   Vitals:   01/14/16 0401 01/14/16 0403  BP: (!) 148/112 (!) 147/81  Pulse: 98 (!) 103  Resp: 18   Temp: 98.7 F (37.1 C)     Dilation: 7 Effacement (%): 80 Cervical Position: Middle Station: 0 Presentation: Vertex Exam by:: Dr.Alf Doyle  IUPC placed with ease AROM had been performed at 00:11, no change in cervix since.   FHT: 120, mod var, +accels, no decels TOCO: Irregular, after IUPC placed: inadequate contractions Pit @ 12   Continue with expectant management, increasing  Pitocin until adequate contractions are reached. FWB: Cat I   Cleda ClarksElizabeth W. Cyle Kenyon, DO  OB Fellow [facility], [facility]

## 2016-01-15 ENCOUNTER — Encounter (HOSPITAL_COMMUNITY): Payer: Self-pay | Admitting: Obstetrics & Gynecology

## 2016-01-15 LAB — CBC
HEMATOCRIT: 23.3 % — AB (ref 36.0–46.0)
Hemoglobin: 8 g/dL — ABNORMAL LOW (ref 12.0–15.0)
MCH: 28.8 pg (ref 26.0–34.0)
MCHC: 34.3 g/dL (ref 30.0–36.0)
MCV: 83.8 fL (ref 78.0–100.0)
PLATELETS: 154 10*3/uL (ref 150–400)
RBC: 2.78 MIL/uL — AB (ref 3.87–5.11)
RDW: 16.1 % — AB (ref 11.5–15.5)
WBC: 15.8 10*3/uL — AB (ref 4.0–10.5)

## 2016-01-15 MED ORDER — FERROUS SULFATE 325 (65 FE) MG PO TABS: 325.0000 mg | ORAL_TABLET | Freq: Two times a day (BID) | ORAL | Status: DC

## 2016-01-15 NOTE — Anesthesia Postprocedure Evaluation (Signed)
Anesthesia Post Note  Patient: Lisa JacobsonYvette Herrera  Procedure(s) Performed: Procedure(s) (LRB): CESAREAN SECTION (N/A)  Patient location during evaluation: Mother Baby Anesthesia Type: Epidural Level of consciousness: awake, awake and alert, oriented and patient cooperative Pain management: pain level controlled Vital Signs Assessment: post-procedure vital signs reviewed and stable Respiratory status: spontaneous breathing, nonlabored ventilation and respiratory function stable Cardiovascular status: stable Postop Assessment: no headache, no backache, no signs of nausea or vomiting and patient able to bend at knees Anesthetic complications: no Comments: Husband helping to translate.     Last Vitals:  Vitals:   01/15/16 0028 01/15/16 0500  BP: 132/78 128/68  Pulse: (!) 108 (!) 111  Resp: 18 18  Temp: 37.7 C 37.2 C    Last Pain:  Vitals:   01/15/16 0500  TempSrc: Oral  PainSc:    Pain Goal: Patients Stated Pain Goal: 3 (01/15/16 0140)               Kobie Whidby L

## 2016-01-15 NOTE — Addendum Note (Signed)
Addendum  created 01/15/16 0751 by Yolonda KidaAlison L Marquita Lias, CRNA   Sign clinical note

## 2016-01-15 NOTE — Anesthesia Postprocedure Evaluation (Signed)
Anesthesia Post Note  Patient: Lisa JacobsonYvette Cavanaugh  Procedure(s) Performed: Procedure(s) (LRB): CESAREAN SECTION (N/A)  Patient location during evaluation: PACU Anesthesia Type: Epidural Level of consciousness: awake, awake and alert and oriented Pain management: pain level controlled Vital Signs Assessment: post-procedure vital signs reviewed and stable Respiratory status: spontaneous breathing, nonlabored ventilation and respiratory function stable Cardiovascular status: stable Postop Assessment: no headache, no backache, epidural receding, patient able to bend at knees and no signs of nausea or vomiting Anesthetic complications: no     Last Vitals:  Vitals:   01/14/16 2333 01/15/16 0028  BP: 130/83 132/78  Pulse: (!) 102 (!) 108  Resp: 18 18  Temp: 37.2 C 37.7 C    Last Pain:  Vitals:   01/15/16 0140  TempSrc:   PainSc: 4    Pain Goal: Patients Stated Pain Goal: 3 (01/15/16 0140)               Cecile HearingStephen Edward Dierra Riesgo

## 2016-01-15 NOTE — Progress Notes (Signed)
Pt. Ambulating with assist from RN and partner to restroom without difficulty. Assisted to chair, pt. Denies dizziness at this time.

## 2016-01-15 NOTE — Progress Notes (Signed)
POSTPARTUM PROGRESS NOTE  Post Op Day 01 Subjective:  Lisa Herrera is a 20 y.o. G1P1001 2446w1d s/p RLTCS.  No acute events overnight.  Pt denies problems with ambulating, voiding or po intake.  She denies nausea or vomiting.  Pain is well controlled.  She has had flatus. She has not had bowel movement.  Lochia Small.   Objective: Blood pressure 128/68, pulse (!) 111, temperature 98.9 F (37.2 C), temperature source Oral, resp. rate 18, height 5\' 1"  (1.549 m), weight 246 lb (111.6 kg), last menstrual period 04/01/2015, SpO2 97 %, unknown if currently breastfeeding.  Physical Exam:  General: alert, cooperative and no distress Lochia:normal flow Heart: RRR with intact distal pulses Abdomen: lower abdomen is tender, dressing is CDI Uterine Fundus: firm, tender DVT Evaluation: No calf swelling or tenderness   Recent Labs  01/13/16 0753 01/15/16 0529  HGB 11.9* 8.0*  HCT 36.0 23.3*    Assessment/Plan:  ASSESSMENT: Lisa Herrera is a 20 y.o. G1P1001 6746w1d s/p RLTCS  Breastfeeding, Lactation consult and Contraception OCPs  #III - Continue antibiotics, patient remains afebrile #PPH - Hb8.0 (11.9) Ferrous sulfate 325 BID. Will continue to monitor    LOS: 2 days   Deniece ReeJosue D Santos, MD 01/15/2016, 8:48 AM

## 2016-01-15 NOTE — Lactation Note (Signed)
This note was copied from a baby's chart. Lactation Consultation Note  Patient Name: Lisa Herrera BJYNW'G Date: 01/15/2016 Reason for consult: Initial assessment Baby at 21 hr of life. Mom stated she does want to bf but she has not been able to get baby latched. She does not think she has any milk because she used the DEBP one time today and got drops. Stressed the importance of nipple stimulation in milk supply. Mom voiced understanding. She will call at next feeding for latch help. Discussed baby behavior, feeding frequency, pumping, supplementing, baby belly size, voids, wt loss, breast changes, and nipple care. She stated she was taught manual expression but she "just gets drops". She does have a spoon in the room. Given lactation handouts. Aware of OP services and support group.    Maternal Data Has patient been taught Hand Expression?: Yes Does the patient have breastfeeding experience prior to this delivery?: No  Feeding    LATCH Score/Interventions                      Lactation Tools Discussed/Used     Consult Status Consult Status: Follow-up Date: 01/15/16 Follow-up type: In-patient    Rulon Eisenmenger 01/15/2016, 4:54 PM

## 2016-01-15 NOTE — Progress Notes (Signed)
RN at bedside, pt. Ambulated to restroom with c/o dizziness.  RN x 3 assisted patient back into bed with call light within reach. Light lochia, no clotting. Pt. Tolerating pain w/ incision site.  Pt. Feels less dizzy while sitting in bed. Breast pump given w/ instructions.  Will continue to assess.

## 2016-01-16 LAB — CBC
HCT: 25 % — ABNORMAL LOW (ref 36.0–46.0)
HEMATOCRIT: 20.5 % — AB (ref 36.0–46.0)
HEMOGLOBIN: 6.7 g/dL — AB (ref 12.0–15.0)
Hemoglobin: 8.3 g/dL — ABNORMAL LOW (ref 12.0–15.0)
MCH: 28.2 pg (ref 26.0–34.0)
MCH: 27.8 pg (ref 26.0–34.0)
MCHC: 32.7 g/dL (ref 30.0–36.0)
MCHC: 33.2 g/dL (ref 30.0–36.0)
MCV: 86.1 fL (ref 78.0–100.0)
MCV: 83.6 fL (ref 78.0–100.0)
PLATELETS: 194 10*3/uL (ref 150–400)
Platelets: 174 10*3/uL (ref 150–400)
RBC: 2.38 MIL/uL — ABNORMAL LOW (ref 3.87–5.11)
RBC: 2.99 MIL/uL — AB (ref 3.87–5.11)
RDW: 16.1 % — AB (ref 11.5–15.5)
RDW: 16.4 % — AB (ref 11.5–15.5)
WBC: 15.8 10*3/uL — AB (ref 4.0–10.5)
WBC: 15.5 10*3/uL — AB (ref 4.0–10.5)

## 2016-01-16 LAB — PREPARE RBC (CROSSMATCH)

## 2016-01-16 MED ORDER — OXYCODONE-ACETAMINOPHEN 5-325 MG PO TABS: 2.0000 | ORAL_TABLET | Freq: Once | ORAL | Status: DC

## 2016-01-16 MED ORDER — SODIUM CHLORIDE 0.9 % IV SOLN: Freq: Once | INTRAVENOUS | Status: DC

## 2016-01-16 MED ORDER — FUROSEMIDE 10 MG/ML IJ SOLN
40.0000 mg | Freq: Once | INTRAMUSCULAR | Status: AC
  Administered 2016-01-16: 40 mg via INTRAVENOUS
  Filled 2016-01-16: qty 4

## 2016-01-16 NOTE — Progress Notes (Signed)
Subjective: Postpartum Day 2: Cesarean Delivery Patient reports incisional pain, tolerating PO, + flatus and no problems voiding.    Objective: Vital signs in last 24 hours: Temp:  [98 F (36.7 C)-98.3 F (36.8 C)] 98.3 F (36.8 C) (10/22 1400) Pulse Rate:  [93-96] 96 (10/22 1400) Resp:  [16-18] 16 (10/22 1400) BP: (99)/(48-63) 99/63 (10/22 1400) SpO2:  [98 %-99 %] 99 % (10/22 1400)  Physical Exam:  General: alert, cooperative, appears stated age, no distress and 3+ pitting edema Lochia: appropriate Uterine Fundus: firm Incision: healing well, no significant drainage, no dehiscence, no significant erythema DVT Evaluation: No evidence of DVT seen on physical exam.   Recent Labs  01/13/16 0753 01/15/16 0529  HGB 11.9* 8.0*  HCT 36.0 23.3*    Assessment/Plan: Status post Cesarean section. Doing well postoperatively.  Continue current care. Will give lasix IV to facilitate fluid excretion.  Lisa Herrera 01/16/2016, 6:08 AM

## 2016-01-16 NOTE — Lactation Note (Signed)
This note was copied from a baby's chart. Lactation Consultation Note  Patient Name: Lisa Herrera ZOXWR'UToday's Date: 01/16/2016 Reason for consult: Follow-up assessment  Mom is receiving a blood transfusion & is in pain. Infant is currently being bottle-fed (infant seems to want only small amounts at this time). Dad assisted w/flanging infant's lips when bottle-feeding.   DEBP is in the room, but now is not an appropriate time to talk w/Mom about expressing her milk.   [name], [name] 01/16/2016, 12:49 PM

## 2016-01-16 NOTE — Progress Notes (Signed)
UR chart review completed.  

## 2016-01-17 LAB — TYPE AND SCREEN
ABO/RH(D): O POS
Antibody Screen: NEGATIVE
UNIT DIVISION: 0
Unit division: 0

## 2016-01-17 MED ORDER — IBUPROFEN 600 MG PO TABS: 600.0000 mg | ORAL_TABLET | Freq: Four times a day (QID) | ORAL | 2 refills | Status: DC

## 2016-01-17 MED ORDER — OXYCODONE-ACETAMINOPHEN 5-325 MG PO TABS: 1.0000 | ORAL_TABLET | ORAL | 0 refills | Status: DC | PRN

## 2016-01-17 MED ORDER — MAGNESIUM HYDROXIDE 400 MG/5ML PO SUSP: 30.0000 mL | ORAL | 0 refills | Status: DC | PRN

## 2016-01-17 MED ORDER — COCONUT OIL OIL: 1.0000 "application " | TOPICAL_OIL | 99 refills | Status: DC | PRN

## 2016-01-17 MED ORDER — SENNOSIDES-DOCUSATE SODIUM 8.6-50 MG PO TABS: 2.0000 | ORAL_TABLET | Freq: Two times a day (BID) | ORAL | 2 refills | Status: DC

## 2016-01-17 MED ORDER — FERROUS SULFATE 325 (65 FE) MG PO TABS: 325.0000 mg | ORAL_TABLET | Freq: Three times a day (TID) | ORAL | 3 refills | Status: DC

## 2016-01-17 NOTE — Discharge Summary (Signed)
Obstetric Discharge Summary Reason for Admission: induction of labor and for postdates: 41.1 Prenatal Procedures: NST and ultrasound Intrapartum Procedures: cesarean: low cervical, transverse Postpartum Procedures: none Complications-Operative and Postpartum: none Hemoglobin  Date Value Ref Range Status  01/16/2016 8.3 (L) 12.0 - 15.0 g/dL Final   HCT  Date Value Ref Range Status  01/16/2016 25.0 (L) 36.0 - 46.0 % Final   Hematocrit  Date Value Ref Range Status  10/06/2015 36.7 34.0 - 46.6 % Final    Physical Exam:  General: alert, cooperative and no distress Lochia: appropriate Uterine Fundus: firm Incision: no significant drainage, no dehiscence, no significant erythema DVT Evaluation: No evidence of DVT seen on physical exam. No cords or calf tenderness. Posterior calf tenderness present.  Discharge Diagnoses: Term Pregnancy-delivered and PLTCS  Discharge Information: Date: 01/17/2016 Activity: pelvic rest Diet: routine Medications: PNV, Ibuprofen, Colace, Iron and Percocet Condition: stable Instructions: refer to practice specific booklet Discharge to: home Follow-up Information    Rachelle A Denney, CNM Follow up in 2 week(s).   Specialty:  Certified Nurse Midwife Contact information: [address] [place] [address] [phone]           Newborn Data: Live born female  Birth Weight: 7 lb 2.5 oz (3245 g) APGAR: 8, 9  Home with mother.  Roe Coombsachelle A Denney, CNM 01/17/2016, 8:33 AM

## 2016-01-17 NOTE — Plan of Care (Signed)
Problem: Skin Integrity: Goal: Demonstration of wound healing without infection will improve Instructed on incision care and dressing removal

## 2016-01-17 NOTE — Lactation Note (Signed)
This note was copied from a baby's chart. Lactation Consultation Note Consult due to difficulty latching and milk supply questions. Baby 5261 hours old and mother states she has been primarily bottle feeding due to pain from C section and feeling like she has "no milk". Mother recently received pain medication. Discussed supply and demand.  Reviewed hand expression - no drops expressed, only glistening. Assisted w/ attempting to latch baby in football and cross cradle hold. Baby sucks for a few minutes and pushes off with her tongue.  Repeatedly tried.  Baby fussy. Baby has been used to fast bottle feeding and seems to get frustrated at the breast. Recommend feeding baby w/ formula and then retrying at the breast once baby is more satisfied and continue to try breast first before each feeding. Discussed pumping w/ mother. Reviewed engorgement care and monitoring voids/stools. Mom encouraged to feed baby 8-12 times/24 hours and with feeding cues.    Patient Name: Girl Loyal JacobsonYvette Puerta HQION'GToday's Date: 01/17/2016 Reason for consult: Follow-up assessment   Maternal Data    Feeding Feeding Type: Formula Nipple Type: Slow - flow  LATCH Score/Interventions Latch: Repeated attempts needed to sustain latch, nipple held in mouth throughout feeding, stimulation needed to elicit sucking reflex.  Audible Swallowing: None  Type of Nipple: Everted at rest and after stimulation  Comfort (Breast/Nipple): Soft / non-tender     Hold (Positioning): Assistance needed to correctly position infant at breast and maintain latch.  LATCH Score: 6  Lactation Tools Discussed/Used     Consult Status Consult Status: Complete    Hardie PulleyBerkelhammer, Costas Sena Boschen 01/17/2016, 8:55 AM

## 2016-01-17 NOTE — Progress Notes (Signed)
Subjective: Postpartum Day #3: PLTCS: Cesarean Delivery Patient reports incisional pain, tolerating PO, + flatus, + BM and no problems voiding.    Objective: Vital signs in last 24 hours: Temp:  [97.9 F (36.6 C)-98.6 F (37 C)] 98.1 F (36.7 C) (10/24 0521) Pulse Rate:  [88-106] 97 (10/24 0521) Resp:  [18-20] 18 (10/24 0521) BP: (114-123)/(52-64) 115/61 (10/24 0521) SpO2:  [98 %-100 %] 100 % (10/23 1335)  Physical Exam:  General: alert, cooperative and no distress Lochia: appropriate Uterine Fundus: firm Incision: healing well, no significant drainage, no dehiscence, no significant erythema DVT Evaluation: No evidence of DVT seen on physical exam. No cords or calf tenderness. Calf/Ankle edema is present.   Recent Labs  01/16/16 0534 01/16/16 2015  HGB 6.7* 8.3*  HCT 20.5* 25.0*    Assessment/Plan: Status post Cesarean section. Doing well postoperatively.  Discharge home with standard precautions and return to clinic in 1-2 weeks.  Roe Coombs, CNM 01/17/2016, 8:26 AM

## 2016-01-27 ENCOUNTER — Encounter (HOSPITAL_COMMUNITY): Payer: Self-pay | Admitting: Emergency Medicine

## 2016-01-27 ENCOUNTER — Emergency Department (HOSPITAL_COMMUNITY)
Admission: EM | Admit: 2016-01-27 | Discharge: 2016-01-27 | Disposition: A | Discharge status: Home / Self Care | Payer: Medicaid Other | Attending: Emergency Medicine | Admitting: Emergency Medicine

## 2016-01-27 DIAGNOSIS — R1013 Epigastric pain: Secondary | ICD-10-CM

## 2016-01-27 LAB — I-STAT BETA HCG BLOOD, ED (MC, WL, AP ONLY): I-stat hCG, quantitative: <5 m[IU]/mL (ref <5)

## 2016-01-27 LAB — CBC WITH DIFFERENTIAL/PLATELET
Basophils Absolute: 0 10*3/uL (ref 0.0–0.1)
Basophils Relative: 0 %
Eosinophils Absolute: 0.1 10*3/uL (ref 0.0–0.7)
Eosinophils Relative: 1 %
HCT: 30.4 % — ABNORMAL LOW (ref 36.0–46.0)
Hemoglobin: 9.7 g/dL — ABNORMAL LOW (ref 12.0–15.0)
Lymphocytes Relative: 26 %
Lymphs Abs: 3.1 10*3/uL (ref 0.7–4.0)
MCH: 26.9 pg (ref 26.0–34.0)
MCHC: 31.9 g/dL (ref 30.0–36.0)
MCV: 84.4 fL (ref 78.0–100.0)
Monocytes Absolute: 0.6 10*3/uL (ref 0.1–1.0)
Monocytes Relative: 5 %
Neutro Abs: 8.4 10*3/uL — ABNORMAL HIGH (ref 1.7–7.7)
Neutrophils Relative %: 68 %
Platelets: 698 10*3/uL — ABNORMAL HIGH (ref 150–400)
RBC: 3.6 MIL/uL — ABNORMAL LOW (ref 3.87–5.11)
RDW: 15.7 % — ABNORMAL HIGH (ref 11.5–15.5)
WBC: 12.2 10*3/uL — ABNORMAL HIGH (ref 4.0–10.5)

## 2016-01-27 LAB — COMPREHENSIVE METABOLIC PANEL
ALT: 37 U/L (ref 14–54)
AST: 25 U/L (ref 15–41)
Albumin: 2.1 g/dL — ABNORMAL LOW (ref 3.5–5.0)
Alkaline Phosphatase: 168 U/L — ABNORMAL HIGH (ref 38–126)
Anion gap: 9 (ref 5–15)
BUN: 11 mg/dL (ref 6–20)
CO2: 22 mmol/L (ref 22–32)
Calcium: 8.7 mg/dL — ABNORMAL LOW (ref 8.9–10.3)
Chloride: 107 mmol/L (ref 101–111)
Creatinine, Ser: 0.62 mg/dL (ref 0.44–1.00)
GFR calc Af Amer: >60 mL/min (ref >60)
GFR calc non Af Amer: >60 mL/min (ref >60)
Glucose, Bld: 106 mg/dL — ABNORMAL HIGH (ref 65–99)
Potassium: 3.8 mmol/L (ref 3.5–5.1)
Sodium: 138 mmol/L (ref 135–145)
Total Bilirubin: <0.1 mg/dL — ABNORMAL LOW (ref 0.3–1.2)
Total Protein: 6.1 g/dL — ABNORMAL LOW (ref 6.5–8.1)

## 2016-01-27 LAB — URINALYSIS, ROUTINE W REFLEX MICROSCOPIC
Bilirubin Urine: NEGATIVE
Glucose, UA: NEGATIVE mg/dL
Ketones, ur: NEGATIVE mg/dL
Nitrite: NEGATIVE
Protein, ur: 100 mg/dL — AB
Specific Gravity, Urine: 1.014 (ref 1.005–1.030)
pH: 6.5 (ref 5.0–8.0)

## 2016-01-27 LAB — URINE MICROSCOPIC-ADD ON

## 2016-01-27 LAB — LIPASE, BLOOD: Lipase: 24 U/L (ref 11–51)

## 2016-01-27 MED ORDER — GI COCKTAIL ~~LOC~~
30.0000 mL | Freq: Once | ORAL | Status: AC
  Administered 2016-01-27: 30 mL via ORAL
  Filled 2016-01-27: qty 30

## 2016-01-27 MED ORDER — FAMOTIDINE 20 MG PO TABS: 20.0000 mg | ORAL_TABLET | Freq: Two times a day (BID) | ORAL | 0 refills | Status: DC

## 2016-01-27 MED ORDER — FAMOTIDINE IN NACL 20-0.9 MG/50ML-% IV SOLN
20.0000 mg | Freq: Once | INTRAVENOUS | Status: AC
  Administered 2016-01-27: 20 mg via INTRAVENOUS
  Filled 2016-01-27: qty 50

## 2016-01-27 NOTE — ED Provider Notes (Signed)
[facility]-EMERGENCY DEPT Provider Note   CSN: [id number] Arrival date & time: 01/27/16  0409     History   Chief Complaint Chief Complaint  Patient presents with  . Epigastric Pain  . Back Pain    HPI Lisa Herrera is a 20 y.o. female.  HPI  20yF with epigastric pain. Onset 3d ago. Intermittent. Feels is at night. Sometimes radiates into back. Poor appetitie and nausea. Diarrhea for past day. No fever or chills. No urinary complaints. She is s/p c-section on 10/21 and discharged on 10/24. She took ibuprofen after discharge up until yesterday. Denies past hx of PUD/GERD. No other abdominal surgical history aside from c-section.   Past Medical History:  Diagnosis Date  . Medical history non-contributory     Patient Active Problem List   Diagnosis Date Noted  . S/P cesarean section for arrest of descent 01/13/2016  . Positive GBS test 01/12/2016  . Maternal varicella, non-immune 01/12/2016  . Supervision of normal first pregnancy in third trimester 07/04/2015  . Abdominal pain affecting pregnancy, antepartum 05/18/2015    Past Surgical History:  Procedure Laterality Date  . CESAREAN SECTION N/A 01/14/2016   Procedure: CESAREAN SECTION;  Surgeon: Tereso NewcomerUgonna A Anyanwu, MD;  Location: [facility];  Service: Obstetrics;  Laterality: N/A;  . NO PAST SURGERIES      OB History    Gravida Para Term Preterm AB Living   1 1 1     1    SAB TAB Ectopic Multiple Live Births         0 1       Home Medications    Prior to Admission medications   Medication Sig Start Date End Date Taking? Authorizing Provider  coconut oil OIL Apply 1 application topically as needed. 01/17/16  Yes Rachelle A Denney, CNM  ferrous sulfate 325 (65 FE) MG tablet Take 1 tablet (325 mg total) by mouth 3 (three) times daily with meals. 01/17/16  Yes Rachelle A Denney, CNM  ibuprofen (ADVIL,MOTRIN) 600 MG tablet Take 1 tablet (600 mg total) by mouth every 6 (six) hours. 01/17/16  Yes Rachelle A Denney, CNM   magnesium hydroxide (MILK OF MAGNESIA) 400 MG/5ML suspension Take 30 mLs by mouth every three (3) days as needed for mild constipation (if no BM). 01/17/16  Yes Rachelle A Denney, CNM  oxyCODONE-acetaminophen (PERCOCET/ROXICET) 5-325 MG tablet Take 1-2 tablets by mouth every 4 (four) hours as needed (pain scale > 7). 01/17/16  Yes Josue Legrand Rams Santos, MD  Prenatal Vit-Fe Phos-FA-Omega (VITAFOL GUMMIES) 3.33-0.333-34.8 MG CHEW Chew 3 tablets by mouth daily. 07/01/15  Yes Rachelle A Denney, CNM  senna-docusate (SENOKOT-S) 8.6-50 MG tablet Take 2 tablets by mouth 2 (two) times daily. 01/17/16  Yes Rachelle Sindy MessingA Denney, CNM    Family History No family history on file.  Social History Social History  Substance Use Topics  . Smoking status: Never Smoker  . Smokeless tobacco: Never Used  . Alcohol use No     Allergies   Review of patient's allergies indicates no known allergies.   Review of Systems Review of Systems  All systems reviewed and negative, other than as noted in HPI.   Physical Exam Updated Vital Signs BP 129/73   Pulse 86   Temp 97.7 F (36.5 C) (Oral)   Resp 26   Ht 5\' 6"  (1.676 m)   Wt 235 lb (106.6 kg)   LMP 04/01/2015   SpO2 99%   BMI 37.93 kg/m   Physical Exam  Constitutional:  She appears well-developed and well-nourished. No distress.  HENT:  Head: Normocephalic and atraumatic.  Eyes: Conjunctivae are normal. Right eye exhibits no discharge. Left eye exhibits no discharge.  Neck: Neck supple.  Cardiovascular: Normal rate, regular rhythm and normal heart sounds.  Exam reveals no gallop and no friction rub.   No murmur heard. Pulmonary/Chest: Effort normal and breath sounds normal. No respiratory distress.  Abdominal: Soft. She exhibits no distension. There is no tenderness.  Surgical site appears to be healing w/o complication. Mild diffuse lower abdominal tenderness. Mild-to-moderate epigastric tenderness w/o rebound or guarding.   Musculoskeletal: She  exhibits no edema or tenderness.  Neurological: She is alert.  Skin: Skin is warm and dry.  Psychiatric: She has a normal mood and affect. Her behavior is normal. Thought content normal.  Nursing note and vitals reviewed.    ED Treatments / Results  Labs (all labs ordered are listed, but only abnormal results are displayed) Labs Reviewed  URINALYSIS, ROUTINE W REFLEX MICROSCOPIC (NOT AT [facility]) - Abnormal; Notable for the following:       Result Value   Hgb urine dipstick LARGE (*)    Protein, ur 100 (*)    Leukocytes, UA SMALL (*)    All other components within normal limits  CBC WITH DIFFERENTIAL/PLATELET - Abnormal; Notable for the following:    WBC 12.2 (*)    RBC 3.60 (*)    Hemoglobin 9.7 (*)    HCT 30.4 (*)    RDW 15.7 (*)    Platelets 698 (*)    Neutro Abs 8.4 (*)    All other components within normal limits  COMPREHENSIVE METABOLIC PANEL - Abnormal; Notable for the following:    Glucose, Bld 106 (*)    Calcium 8.7 (*)    Total Protein 6.1 (*)    Albumin 2.1 (*)    Alkaline Phosphatase 168 (*)    Total Bilirubin <0.1 (*)    All other components within normal limits  URINE MICROSCOPIC-ADD ON - Abnormal; Notable for the following:    Squamous Epithelial / LPF 0-5 (*)    Bacteria, UA RARE (*)    Casts HYALINE CASTS (*)    All other components within normal limits  LIPASE, BLOOD  I-STAT BETA HCG BLOOD, ED ([facility], [facility], [facility] ONLY)    EKG  EKG Interpretation  Date/Time:  Friday January 27 2016 04:20:33 EDT Ventricular Rate:  82 PR Interval:    QRS Duration: 80 QT Interval:  437 QTC Calculation: 511 R Axis:   54 Text Interpretation:  Sinus rhythm Prolonged QT interval No old tracing to compare Confirmed by Nickolus Wadding  MD, Carliss Porcaro [phone]) on 01/27/2016 5:51:39 AM       Radiology No results found.  Procedures Procedures (including critical care time)  Medications Ordered in ED Medications  gi cocktail (Maalox,Lidocaine,Donnatal) (30 mLs Oral Given 01/27/16 0520)    famotidine (PEPCID) IVPB 20 mg premix (0 mg Intravenous Stopped 01/27/16 0552)     Initial Impression / Assessment and Plan / ED Course  I have reviewed the triage vital signs and the nursing notes.  Pertinent labs & imaging results that were available during my care of the patient were reviewed by me and considered in my medical decision making (see chart for details).  Clinical Course    20yF with epigastric pain. Suspect may be GERD/gastritis/PUD. Feels in epigastrium. Has it at night.  Poor appetite. Regular NSAID usage for over a week leading up to onset. Doubt emergent complication of  pregnancy or surgery. Will place on PPI. It has been determined that no acute conditions requiring further emergency intervention are present at this time. The patient has been advised of the diagnosis and plan. I reviewed any labs and imaging including any potential incidental findings. We have discussed signs and symptoms that warrant return to the ED and they are listed in the discharge instructions.    Final Clinical Impressions(s) / ED Diagnoses   Final diagnoses:  Epigastric pain    New Prescriptions New Prescriptions   No medications on file     Raeford RazorStephen Judge Duque, MD 01/27/16 [phone]

## 2016-01-27 NOTE — ED Triage Notes (Signed)
Pt. reports epigastric pain with nausea and diarrhea onset 3 days ago , pain radiating to mid back , denies fever or chills .

## 2016-01-30 ENCOUNTER — Ambulatory Visit (INDEPENDENT_AMBULATORY_CARE_PROVIDER_SITE_OTHER): Payer: Medicaid Other | Admitting: Certified Nurse Midwife

## 2016-01-30 ENCOUNTER — Encounter: Payer: Self-pay | Admitting: Certified Nurse Midwife

## 2016-01-30 VITALS — BP 129/81 | HR 84 | Wt 215.0 lb

## 2016-01-30 DIAGNOSIS — Z30011 Encounter for initial prescription of contraceptive pills: Secondary | ICD-10-CM

## 2016-01-30 MED ORDER — NORETHINDRONE 0.35 MG PO TABS: 1.0000 | ORAL_TABLET | Freq: Every day | ORAL | 11 refills | Status: DC

## 2016-01-30 NOTE — Progress Notes (Signed)
Post Partum Exam  Lisa JacobsonYvette Herrera is a 20 y.o. 381P1001 female who presents for a postpartum visit. She is 2 weeks postpartum following a low cervical transverse Cesarean section. I have fully reviewed the prenatal and intrapartum course. The delivery was at 40+ gestational weeks. PLTCS on 01/14/16 Anesthesia: spinal. Postpartum course has been complicated by appetite problems.  Pt is having some epigastric pain, was seen at ED recently given Rx for gastric upset. Baby's course has been doing well. Baby is feeding by breast, supplement with Similac. Bleeding thin lochia. Bowel function is abnormal: having some GI upset.. Bladder function is normal. Patient is not sexually active. Contraception method is abstinence. Pt would like to discuss BC options, would like pills or patch. Postpartum depression screening:score 7, pt is having trouble after c/s, not being able to pick up baby, breast feed as much due to supply. Pt feels bad about this.  Interpreter present for exam.  Discussed normalcy of healing time and what to expect.  Resting encouraged.  OTC Miralax for constipation.     The following portions of the patient's history were reviewed and updated as appropriate: allergies, current medications, past family history, past medical history, past social history, past surgical history and problem list.  Review of Systems Pertinent items noted in HPI and remainder of comprehensive ROS otherwise negative.    Objective:    BP 116/78 mmHg  Pulse 78  Resp 16  Ht 5\' 5"  (1.651 m)  Wt 211 lb (95.709 kg)  BMI 35.11 kg/m2  Breastfeeding? Yes  General:  alert, cooperative and no distress   Breasts:  inspection negative, no nipple discharge or bleeding, no masses or nodularity palpable  Lungs: clear to auscultation bilaterally  Heart:  regular rate and rhythm, S1, S2 normal, no murmur, click, rub or gallop  Abdomen: soft, non-tender; bowel sounds normal; no masses,  no organomegaly, steri strips removed   Vulva:  normal  Vagina: not evaluated  Cervix:  not evaluated  Corpus: normal  Adnexa:  not evaluated  Rectal Exam: Not performed.        Assessment:    Normal 2 week postpartum exam. Pap smear not done at today's visit.   Lactation support  Contraception counseling  Constipation  Plan:   1. Contraception: abstinence, POP started.  2. Abstain until not bleeding, depression s/s reviewed.  3. Follow up in: 4 weeks or as needed.  4. Annual exam after 06/30/16.

## 2016-02-03 ENCOUNTER — Inpatient Hospital Stay (HOSPITAL_COMMUNITY)
Admission: AD | Admit: 2016-02-03 | Discharge: 2016-02-03 | Disposition: A | Discharge status: Home / Self Care | Payer: Medicaid Other | Source: Ambulatory Visit | Attending: Obstetrics and Gynecology | Admitting: Obstetrics and Gynecology

## 2016-02-03 ENCOUNTER — Encounter (HOSPITAL_COMMUNITY): Payer: Self-pay | Admitting: Certified Nurse Midwife

## 2016-02-03 DIAGNOSIS — R1013 Epigastric pain: Secondary | ICD-10-CM | POA: Diagnosis not present

## 2016-02-03 DIAGNOSIS — R140 Abdominal distension (gaseous): Secondary | ICD-10-CM | POA: Insufficient documentation

## 2016-02-03 DIAGNOSIS — O9963 Diseases of the digestive system complicating the puerperium: Secondary | ICD-10-CM | POA: Insufficient documentation

## 2016-02-03 DIAGNOSIS — O9089 Other complications of the puerperium, not elsewhere classified: Secondary | ICD-10-CM | POA: Diagnosis not present

## 2016-02-03 DIAGNOSIS — K5900 Constipation, unspecified: Secondary | ICD-10-CM | POA: Insufficient documentation

## 2016-02-03 DIAGNOSIS — K5901 Slow transit constipation: Secondary | ICD-10-CM

## 2016-02-03 MED ORDER — POLYETHYLENE GLYCOL 3350 17 G PO PACK: 17.0000 g | PACK | Freq: Every day | ORAL | 0 refills | Status: DC | PRN

## 2016-02-03 MED ORDER — POLYETHYLENE GLYCOL 3350 17 G PO PACK
17.0000 g | PACK | Freq: Every day | ORAL | Status: DC
  Administered 2016-02-03: 17 g via ORAL
  Filled 2016-02-03 (×2): qty 1

## 2016-02-03 NOTE — MAU Provider Note (Signed)
Chief Complaint:  Abdominal Pain   First Provider Initiated Contact with Patient 02/03/16 1630      HPI: Lisa JacobsonYvette Herrera is a 20 y.o. G1P1001 who presents to maternity admissions reporting epigastric pain.  Had a C/S 2 weeks ago.  Admits to having constipation for the past 2 weeks but did have a little bit of liquid stool today.  Has not been evacuating bowels daily. Denies other problems.  She reports vaginal bleeding, vaginal itching/burning, urinary symptoms, h/a, dizziness, n/v, or fever/chills.    Abdominal Pain  This is a recurrent problem. The current episode started 1 to 4 weeks ago. The onset quality is gradual. The problem occurs intermittently. The problem has been waxing and waning. The pain is located in the epigastric region. The pain is mild. The quality of the pain is cramping. The abdominal pain radiates to the back. Pertinent negatives include no constipation, diarrhea, fever, myalgias, nausea or vomiting. Nothing aggravates the pain. The pain is relieved by nothing. She has tried nothing for the symptoms.   RN Note: Pt arrives via EMS for upper quadrant abdominal pain. Pt states it starts in the front, under her sternum and radiates to the back. Pt was seen last Friday for the same problem. Pt had a C-section 2 weeks ago. Pt states she had "a lot of mucous" around her BM 2 hours ago.   Past Medical History: Past Medical History:  Diagnosis Date  . Medical history non-contributory     Past obstetric history: OB History  Gravida Para Term Preterm AB Living  1 1 1     1   SAB TAB Ectopic Multiple Live Births        0 1    # Outcome Date GA Lbr Len/2nd Weight Sex Delivery Anes PTL Lv  1 Term 01/14/16 [id number] / 02:47 7 lb 2.5 oz (3.245 kg) F CS-LTranv EPI  LIV      Past Surgical History: Past Surgical History:  Procedure Laterality Date  . CESAREAN SECTION N/A 01/14/2016   Procedure: CESAREAN SECTION;  Surgeon: Tereso NewcomerUgonna A Anyanwu, MD;  Location: [facility];  Service:  Obstetrics;  Laterality: N/A;  . NO PAST SURGERIES      Family History: History reviewed. No pertinent family history.  Social History: Social History  Substance Use Topics  . Smoking status: Never Smoker  . Smokeless tobacco: Never Used  . Alcohol use No    Allergies: No Known Allergies  Meds:  Prescriptions Prior to Admission  Medication Sig Dispense Refill Last Dose  . ferrous sulfate 325 (65 FE) MG tablet Take 1 tablet (325 mg total) by mouth 3 (three) times daily with meals. 120 tablet 3 02/03/2016 at Unknown time  . ibuprofen (ADVIL,MOTRIN) 600 MG tablet Take 1 tablet (600 mg total) by mouth every 6 (six) hours. 120 tablet 2 Past Month at Unknown time  . magnesium hydroxide (MILK OF MAGNESIA) 400 MG/5ML suspension Take 30 mLs by mouth every three (3) days as needed for mild constipation (if no BM). 360 mL 0 Past Week at Unknown time  . oxyCODONE-acetaminophen (PERCOCET/ROXICET) 5-325 MG tablet Take 1-2 tablets by mouth every 4 (four) hours as needed (pain scale > 7). 45 tablet 0 Past Month at Unknown time  . Prenatal Vit-Fe Phos-FA-Omega (VITAFOL GUMMIES) 3.33-0.333-34.8 MG CHEW Chew 3 tablets by mouth daily. 90 tablet 12 02/03/2016 at Unknown time  . coconut oil OIL Apply 1 application topically as needed. (Patient not taking: Reported on 01/30/2016) 500 mL PRN Not  Taking  . famotidine (PEPCID) 20 MG tablet Take 1 tablet (20 mg total) by mouth 2 (two) times daily. (Patient not taking: Reported on 02/03/2016) 30 tablet 0 Not Taking at Unknown time  . norethindrone (MICRONOR,CAMILA,ERRIN) 0.35 MG tablet Take 1 tablet (0.35 mg total) by mouth daily. (Patient not taking: Reported on 02/03/2016) 1 Package 11 Not Taking at Unknown time  . senna-docusate (SENOKOT-S) 8.6-50 MG tablet Take 2 tablets by mouth 2 (two) times daily. (Patient not taking: Reported on 01/30/2016) 90 tablet 2 Not Taking    I have reviewed patient's Past Medical Hx, Surgical Hx, Family Hx, Social Hx, medications  and allergies.  ROS:  Review of Systems  Constitutional: Negative for fever.  Gastrointestinal: Positive for abdominal pain. Negative for constipation, diarrhea, nausea and vomiting.  Musculoskeletal: Negative for myalgias.   Other systems negative   Physical Exam  Patient Vitals for the past 24 hrs:  BP Temp Temp src Pulse Resp SpO2  02/03/16 1710 133/70 - - 74 20 -  02/03/16 1530 117/70 97.7 F (36.5 C) Oral 66 20 99 %   Constitutional: Well-developed, well-nourished female in no acute distress.  Cardiovascular: normal rate and rhythm, no ectopy audible, S1 & S2 heard, no murmur Respiratory: normal effort, no distress. Lungs CTAB with no wheezes or crackles GI: Abd soft, non-tender throughout except for very mild tenderness at epigastrum.  Nondistended.  No rebound, No guarding.  Bowel Sounds audible    Incision is healing very well, no erethema noted MS: Extremities nontender, no edema, normal ROM Neurologic: Alert and oriented x 4.   Grossly nonfocal. GU: Neg CVAT. Skin:  Warm and Dry Psych:  Affect appropriate.  PELVIC EXAM: deferred. Not indicated  Labs: No results found for this or any previous visit (from the past 24 hour(s)). --/--/O POS (10/20 [id number]) Had a full workup for cholecystitis a week ago and all labs were normal  Imaging:  No results found.  MAU Course/MDM: I have ordered labs as follows: none Imaging ordered: none Results reviewed.   All labs normal last week   Had a workup for the same symptom.  It was negative. With her history my impression is that this is related to constipation and resultant gas pain.  Treatments in MAU included Miralax for constipation.   Pt stable at time of discharge.  Assessment: Status post cesarean delivery 2 weeks ago Constipation with abdominal  Bloating  Plan: Discharge home Recommend plenty of water intake. Take miralax then nightly fiber Rx sent for Miralax for constipation   Encouraged to return here or to  other Urgent Care/ED if she develops worsening of symptoms, increase in pain, fever, or other concerning symptoms.   Wynelle BourgeoisMarie Ryna Beckstrom CNM, MSN Certified Nurse-Midwife 02/03/2016 5:12 PM

## 2016-02-03 NOTE — Discharge Instructions (Signed)
Estreimiento - Adultos (Constipation, Adult) Estreimiento significa que una persona tiene menos de tres evacuaciones en una semana, dificultad para defecar, o que las heces son secas, duras, o ms grandes que lo normal. A medida que envejecemos el estreimiento es ms comn. Una dieta baja en fibra, no tomar suficientes lquidos y el uso de ciertos medicamentos pueden empeorar el estreimiento.  CAUSAS   Ciertos medicamentos, como los antidepresivos, analgsicos, suplementos de hierro, anticidos y diurticos.  Algunas enfermedades, como la diabetes, el sndrome del colon irritable, enfermedad de la tiroides, o depresin.  No beber suficiente agua.  No consumir suficientes alimentos ricos en fibra.  Situaciones de estrs o viajes.  Falta de actividad fsica o de ejercicio.  Ignorar la necesidad sbita de defecar.  Uso en exceso de laxantes. SIGNOS Y SNTOMAS   Defecar menos de tres veces por semana.  Dificultad para defecar.  Tener las heces secas y duras, o ms grandes que las normales.  Sensacin de estar lleno o hinchado.  Dolor en la parte baja del abdomen.  No sentir alivio despus de defecar. DIAGNSTICO  El mdico le har una historia clnica y un examen fsico. Pueden hacerle exmenes adicionales para el estreimiento grave. Estos estudios pueden ser:  Un radiografa con enema de bario para examinar el recto, el colon y, en algunos casos, el intestino delgado.  Una sigmoidoscopia para examinar el colon inferior.  Una colonoscopia para examinar todo el colon. TRATAMIENTO  El tratamiento depender de la gravedad del estreimiento y de la causa. Algunos tratamientos nutricionales son beber ms lquidos y comer ms alimentos ricos en fibra. El cambio en el estilo de vida incluye hacer ejercicios de manera regular. Si estas recomendaciones para realizar cambios en la dieta y en el estilo de vida no ayudan, el mdico le puede indicar el uso de laxantes de venta libre  para ayudarlo a defecar. Los medicamentos recetados se pueden prescribir si los medicamentos de venta libre no lo ayudan.  INSTRUCCIONES PARA EL CUIDADO EN EL HOGAR   Consuma alimentos con alto contenido de fibra, como frutas, vegetales, cereales integrales y porotos.  Limite los alimentos procesados ricos en grasas y azcar, como las papas fritas, hamburguesas, galletas, dulces y refrescos.  Puede agregar un suplemento de fibra a su dieta si no obtiene lo suficiente de los alimentos.  Beba suficiente lquido para mantener la orina clara o de color amarillo plido.  Haga ejercicio regularmente o segn las indicaciones del mdico.  Vaya al bao cuando sienta la necesidad de ir. No se aguante las ganas.  Tome solo medicamentos de venta libre o recetados, segn las indicaciones del mdico. No tome otros medicamentos para el estreimiento sin consultarlo antes con su mdico. SOLICITE ATENCIN MDICA DE INMEDIATO SI:   Observa sangre brillante en las heces.  El estreimiento dura ms de 4 das o empeora.  Siente dolor abdominal o rectal.  Las heces son delgadas como un lpiz.  Pierde peso de manera inexplicable. ASEGRESE DE QUE:   Comprende estas instrucciones.  Controlar su afeccin.  Recibir ayuda de inmediato si no mejora o si empeora.   Esta informacin no tiene como fin reemplazar el consejo del mdico. Asegrese de hacerle al mdico cualquier pregunta que tenga.   Document Released: 04/01/2007 Document Revised: 04/02/2014 Elsevier Interactive Patient Education 2016 [employer] Inc[employer]  

## 2016-02-03 NOTE — MAU Note (Signed)
Pt arrives via EMS for upper quadrant abdominal pain. Pt states it starts in the front, under her sternum and radiates to the back. Pt was seen last Friday for the same problem. Pt had a C-section 2 weeks ago. Pt states she had "a lot of mucous" around her BM 2 hours ago.

## 2016-02-27 ENCOUNTER — Ambulatory Visit (INDEPENDENT_AMBULATORY_CARE_PROVIDER_SITE_OTHER): Payer: Medicaid Other | Admitting: Certified Nurse Midwife

## 2016-02-27 ENCOUNTER — Encounter: Payer: Self-pay | Admitting: Certified Nurse Midwife

## 2016-02-27 VITALS — BP 113/76 | HR 75 | Wt 216.0 lb

## 2016-02-27 DIAGNOSIS — O9081 Anemia of the puerperium: Secondary | ICD-10-CM

## 2016-02-27 DIAGNOSIS — Z98891 History of uterine scar from previous surgery: Secondary | ICD-10-CM | POA: Diagnosis not present

## 2016-02-27 DIAGNOSIS — K2100 Gastro-esophageal reflux disease with esophagitis, without bleeding: Secondary | ICD-10-CM

## 2016-02-27 DIAGNOSIS — Z283 Underimmunization status: Secondary | ICD-10-CM

## 2016-02-27 DIAGNOSIS — O09899 Supervision of other high risk pregnancies, unspecified trimester: Secondary | ICD-10-CM

## 2016-02-27 DIAGNOSIS — K5901 Slow transit constipation: Secondary | ICD-10-CM

## 2016-02-27 DIAGNOSIS — Z2839 Other underimmunization status: Secondary | ICD-10-CM

## 2016-02-27 MED ORDER — SENNOSIDES-DOCUSATE SODIUM 8.6-50 MG PO TABS: 2.0000 | ORAL_TABLET | Freq: Two times a day (BID) | ORAL | 2 refills | Status: DC

## 2016-02-27 MED ORDER — FUSION PLUS PO CAPS: 1.0000 | ORAL_CAPSULE | Freq: Two times a day (BID) | ORAL | 4 refills | Status: DC

## 2016-02-27 MED ORDER — OMEPRAZOLE 20 MG PO CPDR: 20.0000 mg | DELAYED_RELEASE_CAPSULE | Freq: Two times a day (BID) | ORAL | 5 refills | Status: DC

## 2016-02-27 NOTE — Progress Notes (Signed)
Post Partum Exam  Lisa JacobsonYvette Herrera is a 20 y.o. 141P1001 female who presents for a postpartum visit. She is 6 weeks postpartum following a low cervical transverse Cesarean section. I have fully reviewed the prenatal and intrapartum course. The delivery was at 40+ gestational weeks. PLTCS on 01/14/16 Anesthesia: spinal. Postpartum course has been complicated by appetite problems.  Pt is having some epigastric pain, was seen at ED recently given Rx for gastric upset on 01/27/16. [facility] with the dx of constipation on 02/03/16.  Has not been taking any prescribed medications.    Anesthesia: spinal. Postpartum course has been complicated by stomach pain, constipation.  Baby's course has been doing well. Baby is feeding by bottle - Similac Advance. Bleeding no bleeding. Pt states that she had cycle 1 week ago. Bowel function is abnormal: constipation and abdominal pain.. Bladder function is normal. Patient is sexually active. Contraception method is none. Postpartum depression screening:neg - score 3.  Discussed with interpreter about constipation, GERD, anemia at length.  Discussed starting on her contraception and using back up methods for a month.    The following portions of the patient's history were reviewed and updated as appropriate: allergies, current medications, past family history, past medical history, past social history, past surgical history and problem list.  Review of Systems Pertinent items are noted in HPI.    Objective:    BP 116/78 mmHg  Pulse 78  Resp 16  Ht 5\' 5"  (1.651 m)  Wt 211 lb (95.709 kg)  BMI 35.11 kg/m2  Breastfeeding? Yes  General:  alert, cooperative and no distress   Breasts:  inspection negative, no nipple discharge or bleeding, no masses or nodularity palpable  Lungs: clear to auscultation bilaterally  Heart:  regular rate and rhythm, S1, S2 normal, no murmur, click, rub or gallop  Abdomen: soft, non-tender; bowel sounds  normal; no masses,  no organomegaly.  C-section wound well approximated, C/D/I.          Assessment:    Normal 6 week postpartum exam. Pap smear not done at today's visit.   Contraception counseling  GERD  Constipation  Anemia  Plan:   1. Contraception: abstinence, condoms and oral progesterone-only contraceptive 2. Discussed insurance: needs to get private insurance: spouse owns own company.  F/U CBC in 2 months after starting on Iron sent to the pharmacy.  Is to use OTC Miralax, Colace for constipation.  Considering GI referral if not improved with OTC meds.  3. Follow up in: 5 months or as needed.

## 2016-04-24 ENCOUNTER — Other Ambulatory Visit: Payer: Self-pay | Admitting: Physician Assistant

## 2016-04-24 DIAGNOSIS — R1013 Epigastric pain: Secondary | ICD-10-CM

## 2016-04-27 ENCOUNTER — Ambulatory Visit
Admission: RE | Admit: 2016-04-27 | Discharge: 2016-04-27 | Disposition: A | Discharge status: Home / Self Care | Payer: BLUE CROSS/BLUE SHIELD | Source: Ambulatory Visit | Attending: Physician Assistant | Admitting: Physician Assistant

## 2016-04-27 DIAGNOSIS — R1013 Epigastric pain: Secondary | ICD-10-CM

## 2016-04-30 ENCOUNTER — Other Ambulatory Visit: Payer: Self-pay | Admitting: Certified Nurse Midwife

## 2016-04-30 ENCOUNTER — Other Ambulatory Visit: Payer: Medicaid Other

## 2016-04-30 DIAGNOSIS — D649 Anemia, unspecified: Secondary | ICD-10-CM

## 2016-05-01 LAB — CBC
Hematocrit: 41 % (ref 34.0–46.6)
Hemoglobin: 12.8 g/dL (ref 11.1–15.9)
MCH: 26.7 pg (ref 26.6–33.0)
MCHC: 31.2 g/dL — AB (ref 31.5–35.7)
MCV: 86 fL (ref 79–97)
PLATELETS: 310 10*3/uL (ref 150–379)
RBC: 4.79 x10E6/uL (ref 3.77–5.28)
RDW: 15 % (ref 12.3–15.4)
WBC: 6.3 10*3/uL (ref 3.4–10.8)

## 2016-05-27 ENCOUNTER — Emergency Department (HOSPITAL_COMMUNITY): Payer: BLUE CROSS/BLUE SHIELD

## 2016-05-27 ENCOUNTER — Emergency Department (HOSPITAL_COMMUNITY)
Admission: EM | Admit: 2016-05-27 | Discharge: 2016-05-27 | Disposition: A | Discharge status: Home / Self Care | Payer: BLUE CROSS/BLUE SHIELD | Attending: Emergency Medicine | Admitting: Emergency Medicine

## 2016-05-27 ENCOUNTER — Encounter (HOSPITAL_COMMUNITY): Payer: Self-pay | Admitting: Emergency Medicine

## 2016-05-27 DIAGNOSIS — Z79899 Other long term (current) drug therapy: Secondary | ICD-10-CM | POA: Diagnosis not present

## 2016-05-27 DIAGNOSIS — R1011 Right upper quadrant pain: Secondary | ICD-10-CM | POA: Diagnosis present

## 2016-05-27 DIAGNOSIS — N3001 Acute cystitis with hematuria: Secondary | ICD-10-CM | POA: Diagnosis not present

## 2016-05-27 DIAGNOSIS — K8020 Calculus of gallbladder without cholecystitis without obstruction: Secondary | ICD-10-CM

## 2016-05-27 HISTORY — DX: Calculus of gallbladder without cholecystitis without obstruction: K80.20

## 2016-05-27 LAB — URINALYSIS, ROUTINE W REFLEX MICROSCOPIC
Bilirubin Urine: NEGATIVE
Glucose, UA: NEGATIVE mg/dL
Ketones, ur: NEGATIVE mg/dL
Nitrite: NEGATIVE
Protein, ur: 100 mg/dL — AB
SPECIFIC GRAVITY, URINE: 1.026 (ref 1.005–1.030)
pH: 5 (ref 5.0–8.0)

## 2016-05-27 LAB — CBC
HCT: 40.5 % (ref 36.0–46.0)
Hemoglobin: 13.3 g/dL (ref 12.0–15.0)
MCH: 27.3 pg (ref 26.0–34.0)
MCHC: 32.8 g/dL (ref 30.0–36.0)
MCV: 83.2 fL (ref 78.0–100.0)
PLATELETS: 269 10*3/uL (ref 150–400)
RBC: 4.87 MIL/uL (ref 3.87–5.11)
RDW: 14.8 % (ref 11.5–15.5)
WBC: 12.5 10*3/uL — ABNORMAL HIGH (ref 4.0–10.5)

## 2016-05-27 LAB — COMPREHENSIVE METABOLIC PANEL
ALK PHOS: 163 U/L — AB (ref 38–126)
ALT: 227 U/L — AB (ref 14–54)
AST: 161 U/L — AB (ref 15–41)
Albumin: 4 g/dL (ref 3.5–5.0)
Anion gap: 11 (ref 5–15)
BUN: 15 mg/dL (ref 6–20)
CALCIUM: 9.4 mg/dL (ref 8.9–10.3)
CHLORIDE: 104 mmol/L (ref 101–111)
CO2: 21 mmol/L — AB (ref 22–32)
CREATININE: 0.65 mg/dL (ref 0.44–1.00)
GFR calc Af Amer: >60 mL/min (ref >60)
Glucose, Bld: 110 mg/dL — ABNORMAL HIGH (ref 65–99)
Potassium: 3.9 mmol/L (ref 3.5–5.1)
SODIUM: 136 mmol/L (ref 135–145)
Total Bilirubin: 0.7 mg/dL (ref 0.3–1.2)
Total Protein: 6.7 g/dL (ref 6.5–8.1)

## 2016-05-27 LAB — POC URINE PREG, ED: PREG TEST UR: NEGATIVE

## 2016-05-27 LAB — LIPASE, BLOOD: LIPASE: 20 U/L (ref 11–51)

## 2016-05-27 MED ORDER — SODIUM CHLORIDE 0.9 % IV BOLUS (SEPSIS)
1000.0000 mL | Freq: Once | INTRAVENOUS | Status: AC
Start: 2016-05-27 — End: 2016-05-27
  Administered 2016-05-27: 1000 mL via INTRAVENOUS

## 2016-05-27 MED ORDER — HYDROCODONE-ACETAMINOPHEN 5-325 MG PO TABS: 1.0000 | ORAL_TABLET | ORAL | 0 refills | Status: DC | PRN

## 2016-05-27 MED ORDER — FENTANYL CITRATE (PF) 100 MCG/2ML IJ SOLN
25.0000 ug | Freq: Once | INTRAMUSCULAR | Status: AC
  Administered 2016-05-27: 25 ug via INTRAVENOUS
  Filled 2016-05-27: qty 2

## 2016-05-27 MED ORDER — CEPHALEXIN 250 MG PO CAPS
500.0000 mg | ORAL_CAPSULE | Freq: Once | ORAL | Status: AC
  Administered 2016-05-27: 500 mg via ORAL
  Filled 2016-05-27: qty 2

## 2016-05-27 MED ORDER — ONDANSETRON HCL 4 MG PO TABS: 4.0000 mg | ORAL_TABLET | Freq: Four times a day (QID) | ORAL | 0 refills | Status: DC

## 2016-05-27 MED ORDER — ONDANSETRON HCL 4 MG/2ML IJ SOLN
4.0000 mg | Freq: Once | INTRAMUSCULAR | Status: AC
  Administered 2016-05-27: 4 mg via INTRAVENOUS
  Filled 2016-05-27: qty 2

## 2016-05-27 MED ORDER — SODIUM CHLORIDE 0.9 % IV BOLUS (SEPSIS)
1000.0000 mL | Freq: Once | INTRAVENOUS | Status: AC
  Administered 2016-05-27: 1000 mL via INTRAVENOUS

## 2016-05-27 MED ORDER — CEPHALEXIN 500 MG PO CAPS: 500.0000 mg | ORAL_CAPSULE | Freq: Four times a day (QID) | ORAL | 0 refills | Status: DC

## 2016-05-27 NOTE — ED Notes (Signed)
Pt in U/S, female friend waiting in room

## 2016-05-27 NOTE — Discharge Instructions (Signed)
Keep your scheduled appointment for surgery this week. Take norco for pain as needed, and zofran for nausea. Take Keflex to treat the urinary tract infection. Return here with any high fever, severe pain or uncontrolled vomiting.

## 2016-05-27 NOTE — ED Triage Notes (Addendum)
Pt to ED from home c/o intermittent RUQ pain with nausea and diarrhea - pain started last night, pt stated 1 episode diarrhea Wednesday and one earlier today. Pt reports being diagnosed with gallstones 3 weeks ago by her PCP and sts it feels like the exact same pain but worse. Denies vomiting. Per husband, pt has scheduled surgery to remove gallbladder on Tuesday.

## 2016-05-27 NOTE — ED Provider Notes (Signed)
[facility]-EMERGENCY DEPT Provider Note   CSN: [id number] Arrival date & time: 05/27/16  0153     History   Chief Complaint Chief Complaint  Patient presents with  . Abdominal Pain    HPI Lisa Herrera is a 21 y.o. female.  Patient diagnosed with gall stones 3 weeks ago by ultrasound presents with worsening, uncontrolled RUQ pain that started last night associated with nausea. No fever. She is scheduled for an elective cholecystectomy on March 6th (in 2 days) but was concerned her condition was becoming severe and was told to return if symptoms worsened.     The history is provided by the patient. A language interpreter was used (Husband is interpreting).  Abdominal Pain   Associated symptoms include diarrhea and nausea. Pertinent negatives include fever.    Past Medical History:  Diagnosis Date  . Gallstones   . Medical history non-contributory     Patient Active Problem List   Diagnosis Date Noted  . S/P cesarean section for arrest of descent 01/13/2016  . Maternal varicella, non-immune 01/12/2016  . Supervision of normal first pregnancy in third trimester 07/04/2015    Past Surgical History:  Procedure Laterality Date  . CESAREAN SECTION N/A 01/14/2016   Procedure: CESAREAN SECTION;  Surgeon: Tereso NewcomerUgonna A Anyanwu, MD;  Location: [facility];  Service: Obstetrics;  Laterality: N/A;  . NO PAST SURGERIES      OB History    Gravida Para Term Preterm AB Living   1 1 1     1    SAB TAB Ectopic Multiple Live Births         0 1       Home Medications    Prior to Admission medications   Medication Sig Start Date End Date Taking? Authorizing Provider  famotidine (PEPCID) 20 MG tablet Take 1 tablet (20 mg total) by mouth 2 (two) times daily. Patient not taking: Reported on 02/27/2016 01/27/16   Raeford RazorStephen Kohut, MD  ferrous sulfate 325 (65 FE) MG tablet Take 1 tablet (325 mg total) by mouth 3 (three) times daily with meals. Patient not taking: Reported on 02/27/2016 01/17/16    Rachelle A Denney, CNM  Iron-FA-B Cmp-C-Biot-Probiotic (FUSION PLUS) CAPS Take 1 tablet by mouth 2 (two) times daily. 02/27/16   Rachelle A Denney, CNM  magnesium hydroxide (MILK OF MAGNESIA) 400 MG/5ML suspension Take 30 mLs by mouth every three (3) days as needed for mild constipation (if no BM). Patient not taking: Reported on 02/27/2016 01/17/16   Rachelle A Denney, CNM  norethindrone (MICRONOR,CAMILA,ERRIN) 0.35 MG tablet Take 1 tablet (0.35 mg total) by mouth daily. Patient not taking: Reported on 02/27/2016 01/30/16   Rachelle A Denney, CNM  omeprazole (PRILOSEC) 20 MG capsule Take 1 capsule (20 mg total) by mouth 2 (two) times daily before a meal. 02/27/16   Rachelle A Denney, CNM  polyethylene glycol (MIRALAX) packet Take 17 g by mouth daily as needed. Patient not taking: Reported on 02/27/2016 02/03/16   Aviva SignsMarie L Williams, CNM  Prenatal Vit-Fe Phos-FA-Omega (VITAFOL GUMMIES) 3.33-0.333-34.8 MG CHEW Chew 3 tablets by mouth daily. Patient not taking: Reported on 02/27/2016 07/01/15   Rachelle A Denney, CNM  senna-docusate (SENOKOT-S) 8.6-50 MG tablet Take 2 tablets by mouth 2 (two) times daily. 02/27/16   Roe Coombsachelle A Denney, CNM    Family History No family history on file.  Social History Social History  Substance Use Topics  . Smoking status: Never Smoker  . Smokeless tobacco: Never Used  . Alcohol use No  Allergies   Patient has no known allergies.   Review of Systems Review of Systems  Constitutional: Negative for chills and fever.  Respiratory: Negative.  Negative for shortness of breath.   Cardiovascular: Negative.  Negative for chest pain.  Gastrointestinal: Positive for abdominal pain, diarrhea and nausea.  Genitourinary: Negative.   Musculoskeletal: Negative.   Neurological: Negative.      Physical Exam Updated Vital Signs BP 105/62   Pulse 71   Temp 97.6 F (36.4 C) (Oral)   SpO2 100%   Physical Exam  Constitutional: She is oriented to person, place, and  time. She appears well-developed and well-nourished.  HENT:  Head: Normocephalic.  Neck: Normal range of motion. Neck supple.  Cardiovascular: Normal rate and regular rhythm.   Pulmonary/Chest: Effort normal and breath sounds normal. She has no wheezes.  Abdominal: Soft. There is tenderness (RUQ tenderness with guarding). There is guarding. There is no rebound.  Musculoskeletal: Normal range of motion.  Neurological: She is alert and oriented to person, place, and time.  Skin: Skin is warm and dry. No rash noted.  Psychiatric: She has a normal mood and affect.     ED Treatments / Results  Labs (all labs ordered are listed, but only abnormal results are displayed) Labs Reviewed  COMPREHENSIVE METABOLIC PANEL - Abnormal; Notable for the following:       Result Value   CO2 21 (*)    Glucose, Bld 110 (*)    AST 161 (*)    ALT 227 (*)    Alkaline Phosphatase 163 (*)    All other components within normal limits  CBC - Abnormal; Notable for the following:    WBC 12.5 (*)    All other components within normal limits  URINALYSIS, ROUTINE W REFLEX MICROSCOPIC - Abnormal; Notable for the following:    APPearance HAZY (*)    Hgb urine dipstick MODERATE (*)    Protein, ur 100 (*)    Leukocytes, UA SMALL (*)    Bacteria, UA RARE (*)    Squamous Epithelial / LPF 0-5 (*)    Non Squamous Epithelial 0-5 (*)    All other components within normal limits  LIPASE, BLOOD  POC URINE PREG, ED    EKG  EKG Interpretation None       Radiology No results found.  Procedures Procedures (including critical care time)  Medications Ordered in ED Medications  ondansetron (ZOFRAN) injection 4 mg (4 mg Intravenous Given 05/27/16 0251)  sodium chloride 0.9 % bolus 1,000 mL (1,000 mLs Intravenous New Bag/Given 05/27/16 0250)  fentaNYL (SUBLIMAZE) injection 25 mcg (25 mcg Intravenous Given 05/27/16 0252)     Initial Impression / Assessment and Plan / ED Course  I have reviewed the triage vital  signs and the nursing notes.  Pertinent labs & imaging results that were available during my care of the patient were reviewed by me and considered in my medical decision making (see chart for details).     Patient presents with severe, worsening RUQ pain after recent diagnosis of gall stones with pending elective surgery scheduled this week. No fever.   She is tender in the RUQ . Given Fentanyl with some relief and declines further pain medications. Zofran given for nausea.   Labs showing UTI. Repeat US without evidence of cholecystitis. She can be discharged home with antibiotics for UTI, pain medications for prn pain control, Zofran for nausea.   Final Clinical Impressions(s) / ED Diagnoses   Final diagnoses:  None  1. Cholelithiasis 2. UTI  New Prescriptions New Prescriptions   No medications on file     Elpidio Anis, PA-C 05/27/16 [id number]    Shon Baton, MD 05/28/16 [id number]

## 2016-05-29 ENCOUNTER — Other Ambulatory Visit: Payer: Self-pay | Admitting: General Surgery

## 2016-10-15 ENCOUNTER — Other Ambulatory Visit (HOSPITAL_COMMUNITY)
Admission: RE | Admit: 2016-10-15 | Discharge: 2016-10-15 | Disposition: A | Discharge status: Home / Self Care | Payer: BLUE CROSS/BLUE SHIELD | Source: Ambulatory Visit | Attending: Obstetrics and Gynecology | Admitting: Obstetrics and Gynecology

## 2016-10-15 ENCOUNTER — Other Ambulatory Visit: Payer: Self-pay | Admitting: Obstetrics and Gynecology

## 2016-10-15 DIAGNOSIS — Z124 Encounter for screening for malignant neoplasm of cervix: Secondary | ICD-10-CM | POA: Diagnosis not present

## 2016-10-27 ENCOUNTER — Encounter (HOSPITAL_COMMUNITY): Payer: Self-pay | Admitting: Emergency Medicine

## 2016-10-27 ENCOUNTER — Ambulatory Visit (HOSPITAL_COMMUNITY)
Admission: EM | Admit: 2016-10-27 | Discharge: 2016-10-27 | Disposition: A | Discharge status: Home / Self Care | Payer: BLUE CROSS/BLUE SHIELD

## 2016-10-27 ENCOUNTER — Encounter (HOSPITAL_COMMUNITY): Payer: Self-pay

## 2016-10-27 ENCOUNTER — Emergency Department (HOSPITAL_COMMUNITY)
Admission: EM | Admit: 2016-10-27 | Discharge: 2016-10-27 | Disposition: A | Discharge status: Home / Self Care | Payer: BLUE CROSS/BLUE SHIELD | Attending: Emergency Medicine | Admitting: Emergency Medicine

## 2016-10-27 DIAGNOSIS — J020 Streptococcal pharyngitis: Secondary | ICD-10-CM | POA: Insufficient documentation

## 2016-10-27 DIAGNOSIS — J029 Acute pharyngitis, unspecified: Secondary | ICD-10-CM

## 2016-10-27 DIAGNOSIS — J988 Other specified respiratory disorders: Secondary | ICD-10-CM

## 2016-10-27 DIAGNOSIS — J351 Hypertrophy of tonsils: Secondary | ICD-10-CM

## 2016-10-27 LAB — RAPID STREP SCREEN (MED CTR MEBANE ONLY): STREPTOCOCCUS, GROUP A SCREEN (DIRECT): POSITIVE — AB

## 2016-10-27 MED ORDER — ACETAMINOPHEN 325 MG PO TABS
ORAL_TABLET | ORAL | Status: AC
  Filled 2016-10-27: qty 2

## 2016-10-27 MED ORDER — HYDROCOD POLST-CPM POLST ER 10-8 MG/5ML PO SUER
5.0000 mL | Freq: Once | ORAL | Status: AC
  Administered 2016-10-27: 5 mL via ORAL
  Filled 2016-10-27: qty 5

## 2016-10-27 MED ORDER — PROMETHAZINE-PHENYLEPHRINE 6.25-5 MG/5ML PO SYRP: 5.0000 mL | ORAL_SOLUTION | Freq: Three times a day (TID) | ORAL | 0 refills | Status: DC | PRN

## 2016-10-27 MED ORDER — DEXAMETHASONE 10 MG/ML FOR PEDIATRIC ORAL USE
10.0000 mg | Freq: Once | INTRAMUSCULAR | Status: AC
  Administered 2016-10-27: 10 mg via ORAL
  Filled 2016-10-27: qty 1

## 2016-10-27 MED ORDER — PENICILLIN G BENZATHINE 1200000 UNIT/2ML IM SUSP
1.2000 10*6.[IU] | Freq: Once | INTRAMUSCULAR | Status: AC
  Administered 2016-10-27: 1.2 10*6.[IU] via INTRAMUSCULAR
  Filled 2016-10-27: qty 2

## 2016-10-27 MED ORDER — ACETAMINOPHEN 325 MG PO TABS
650.0000 mg | ORAL_TABLET | Freq: Once | ORAL | Status: AC | PRN
  Administered 2016-10-27: 650 mg via ORAL

## 2016-10-27 NOTE — ED Triage Notes (Signed)
The patient presented to the [facility] with a complaint of a sore throat x 2 weeks.  

## 2016-10-27 NOTE — Discharge Instructions (Signed)
Take liquid tylenol and motrin as needed for pain and fever. Follow up with your doctor to discuss recurrent strep and enlarged tonsils.  Return here for difficulty swallowing, breathing problems or other worsening symptoms.   You should not need additional antibiotics since we gave the shot of penicillin.

## 2016-10-27 NOTE — ED Provider Notes (Signed)
Medical screening examination/treatment/procedure(s) were conducted as a shared visit with non-physician practitioner(s) and myself.  I personally evaluated the patient during the encounter.   EKG Interpretation None     Patient was seen at urgent care and referred to the emergency department for evaluation. She did have URI symptoms for couple of weeks that just over the past days of fever and very swollen tonsils with pain with swallowing. The patient is seen after she has had Decadron and Tylenol. She reports feeling improved. On exam the patient is nontoxic. She is handling her secretions. She is able to speak clearly with only slight muffled voice. Examination shows large, kissing tonsils but they are symmetric with patent airway. She has no drooling or pooling of secretions. No stridor. Patient has been sipping juice. At this time, I feel she is stable for continued outpatient management. She is counselor return precautions such as inability to take in fluids any difficulty breathing or other concerning symptoms.   Arby BarrettePfeiffer, Kenndra Morris, MD 10/27/16 2205

## 2016-10-27 NOTE — ED Provider Notes (Signed)
CSN: [id number]     Arrival date & time 10/27/16  1701 History   None    Chief Complaint  Patient presents with  . Sore Throat   (Consider location/radiation/quality/duration/timing/severity/associated sxs/prior Treatment) Patient c/o sore throat for 2 weeks.  She is having difficulty breathing and difficulty swallowing her oral secretions.   The history is provided by the patient.  Sore Throat  This is a new problem. The problem occurs constantly. The problem has not changed since onset.Associated symptoms include shortness of breath. Nothing aggravates the symptoms. Nothing relieves the symptoms. She has tried nothing for the symptoms.    Past Medical History:  Diagnosis Date  . Gallstones   . Medical history non-contributory    Past Surgical History:  Procedure Laterality Date  . CESAREAN SECTION N/A 01/14/2016   Procedure: CESAREAN SECTION;  Surgeon: Tereso NewcomerUgonna A Anyanwu, MD;  Location: [facility];  Service: Obstetrics;  Laterality: N/A;  . NO PAST SURGERIES     History reviewed. No pertinent family history. Social History  Substance Use Topics  . Smoking status: Never Smoker  . Smokeless tobacco: Never Used  . Alcohol use No   OB History    Gravida Para Term Preterm AB Living   1 1 1     1    SAB TAB Ectopic Multiple Live Births         0 1     Review of Systems  Constitutional: Positive for fatigue and fever.  HENT: Positive for sore throat.   Eyes: Negative.   Respiratory: Positive for shortness of breath.   Cardiovascular: Negative.   Endocrine: Negative.   Genitourinary: Negative.   Musculoskeletal: Negative.   Allergic/Immunologic: Negative.   Neurological: Negative.   Hematological: Negative.   Psychiatric/Behavioral: Negative.     Allergies  Patient has no known allergies.  Home Medications   Prior to Admission medications   Not on File   Meds Ordered and Administered this Visit  Medications - No data to display  BP 136/75 (BP Location:  Right Arm)   Pulse (!) 114   Temp 98.5 F (36.9 C) (Oral)   Resp 20   SpO2 99%  No data found.   Physical Exam  Constitutional: She is oriented to person, place, and time. She appears well-developed and well-nourished. She appears distressed.  Patient having difficulty talking   HENT:  Head: Normocephalic and atraumatic.  Right Ear: External ear normal.  Left Ear: External ear normal.  OPX - Occluded with tonsils and no gap or space inbetween tonsils and patient is drooling oral secretions and has mild trismus and just small opening at bottom of tonsils for airway.    Eyes: Pupils are equal, round, and reactive to light. Conjunctivae and EOM are normal.  Neck: Normal range of motion. Neck supple.  Cardiovascular: Normal rate, regular rhythm and normal heart sounds.   Pulmonary/Chest: Effort normal and breath sounds normal.  Abdominal: Soft. Bowel sounds are normal.  Neurological: She is alert and oriented to person, place, and time.  Nursing note and vitals reviewed.   Urgent Care Course     Procedures (including critical care time)  Labs Review Labs Reviewed - No data to display  Imaging Review No results found.   Visual Acuity Review  Right Eye Distance:   Left Eye Distance:   Bilateral Distance:    Right Eye Near:   Left Eye Near:    Bilateral Near:         MDM  1. Pharyngitis, unspecified etiology   2. Airway obstruction    Recommend going to ED immediately      Deatra CanterOxford, William J, FNP 10/27/16 1823

## 2016-10-27 NOTE — ED Triage Notes (Signed)
Patient complains of tonsil swelling and fever x 1 day.  [facility]. Patient able to handle secretions. Bilateral tonsils swollen and red, reports fever with same, cough 1 week ago.

## 2016-10-27 NOTE — Discharge Instructions (Signed)
Go directly to ED

## 2016-10-27 NOTE — ED Provider Notes (Signed)
[facility]-EMERGENCY DEPT Provider Note   CSN: [id number] Arrival date & time: 10/27/16  1753     History   Chief Complaint Chief Complaint  Patient presents with  . fever/tonsils enlarged    HPI Lisa Herrera is a 21 y.o. female who presents to the ED from Urgent Care for sore throat, gland swelling and cough. The cough started about 2 weeks ago and she had just runny nose and URI symptoms. Yesterday she developed a sore throat and fever. She went to Urgent Care and they evaluated her and told her to come to the ED due to the size of her tonsils.   HPI  Past Medical History:  Diagnosis Date  . Gallstones   . Medical history non-contributory     Patient Active Problem List   Diagnosis Date Noted  . S/P cesarean section for arrest of descent 01/13/2016  . Maternal varicella, non-immune 01/12/2016  . Supervision of normal first pregnancy in third trimester 07/04/2015    Past Surgical History:  Procedure Laterality Date  . CESAREAN SECTION N/A 01/14/2016   Procedure: CESAREAN SECTION;  Surgeon: Tereso NewcomerUgonna A Anyanwu, MD;  Location: [facility];  Service: Obstetrics;  Laterality: N/A;  . NO PAST SURGERIES      OB History    Gravida Para Term Preterm AB Living   1 1 1     1    SAB TAB Ectopic Multiple Live Births         0 1       Home Medications    Prior to Admission medications   Medication Sig Start Date End Date Taking? Authorizing Provider  promethazine-phenylephrine (PROMETHAZINE VC) 6.25-5 MG/5ML SYRP Take 5 mLs by mouth every 8 (eight) hours as needed for congestion. 10/27/16   Janne NapoleonNeese, Sunshyne Horvath M, NP    Family History No family history on file.  Social History Social History  Substance Use Topics  . Smoking status: Never Smoker  . Smokeless tobacco: Never Used  . Alcohol use No     Allergies   Patient has no known allergies.   Review of Systems Review of Systems  Constitutional: Positive for chills and fever.  HENT: Positive for congestion and sore  throat. Negative for facial swelling and trouble swallowing.   Eyes: Negative for redness.  Respiratory: Positive for cough. Negative for chest tightness and shortness of breath.   Gastrointestinal: Negative for abdominal pain, diarrhea, nausea and vomiting.  Genitourinary: Negative for dysuria and flank pain.  Musculoskeletal: Negative for neck stiffness.  Skin: Negative for rash.  Neurological: Positive for headaches. Negative for weakness.  Hematological: Positive for adenopathy.  Psychiatric/Behavioral: The patient is not nervous/anxious.      Physical Exam Updated Vital Signs BP 122/65 (BP Location: Right Arm)   Pulse (!) 104   Temp 98.4 F (36.9 C) (Oral)   Resp 18   SpO2 100%   Physical Exam  Constitutional: She appears well-developed and well-nourished. No distress.  HENT:  Head: Normocephalic.  Right Ear: Tympanic membrane normal.  Left Ear: Tympanic membrane normal.  Nose: Rhinorrhea present.  Mouth/Throat: Uvula is midline and mucous membranes are normal. No trismus in the jaw. Oropharyngeal exudate and posterior oropharyngeal erythema present. No posterior oropharyngeal edema or tonsillar abscesses. Tonsils are 4+ on the right. Tonsils are 4+ on the left. Tonsillar exudate.  Kissing tonsils with exudate, however, patient is able to swallow without difficulty and there is no airway compromise.   Eyes: Pupils are equal, round, and reactive to light. Conjunctivae  and EOM are normal.  Neck: Neck supple.  Cardiovascular: Regular rhythm.  Tachycardia present.   Pulmonary/Chest: Effort normal. No respiratory distress. She has no wheezes. She has no rales.  Abdominal: Soft. Bowel sounds are normal. There is no tenderness.  Musculoskeletal: Normal range of motion.  Lymphadenopathy:    She has cervical adenopathy.  Neurological: She is alert.  Skin: Skin is warm and dry.  Psychiatric: She has a normal mood and affect.  Nursing note and vitals reviewed.   Dr. Donnald GarrePfeiffer in  to evaluate the patient and agrees with treatment and plan of care.   ED Treatments / Results  Labs (all labs ordered are listed, but only abnormal results are displayed) Labs Reviewed  RAPID STREP SCREEN (NOT [facility]) - Abnormal; Notable for the following:       Result Value   Streptococcus, Group A Screen (Direct) POSITIVE (*)    All other components within normal limits    Radiology No results found.  Procedures Procedures (including critical care time)  Medications Ordered in ED Medications  acetaminophen (TYLENOL) tablet 650 mg (650 mg Oral Given 10/27/16 1801)  dexamethasone (DECADRON) 10 MG/ML injection for Pediatric ORAL use 10 mg (10 mg Oral Given 10/27/16 2027)  chlorpheniramine-HYDROcodone (TUSSIONEX) 10-8 MG/5ML suspension 5 mL (5 mLs Oral Given 10/27/16 2028)  penicillin g benzathine (BICILLIN LA) 1200000 UNIT/2ML injection 1.2 Million Units (1.2 Million Units Intramuscular Given 10/27/16 2023)     Initial Impression / Assessment and Plan / ED Course  I have reviewed the triage vital signs and the nursing notes.  Pertinent lab results that were available during my care of the patient were reviewed by me and considered in my medical decision making (see chart for details).  MDM Number of Diagnoses or Management Options Enlarged tonsils:  Strep pharyngitis:    Final Clinical Impressions(s) / ED Diagnoses  21 y.o. female with sore throat, fever and positive strep screen stable for d/c without difficulty swallowing or respiratory distress. Patient treated with penicillin injection, decadron, pain management while in the ED. Discussed in detail return precautions with the patient and her family member. They will f/u with the PCP to discuss possible ENT referral since patient has had recurrent strep.  Final diagnoses:  Strep pharyngitis  Enlarged tonsils    New Prescriptions New Prescriptions   PROMETHAZINE-PHENYLEPHRINE (PROMETHAZINE VC) 6.25-5 MG/5ML SYRP    Take 5 mLs  by mouth every 8 (eight) hours as needed for congestion.     Kerrie Buffaloeese, Murna Backer Getty[place], [place]NP 10/27/16 2215    Arby BarrettePfeiffer, Marcy, MD 10/28/16 0000

## 2016-10-27 NOTE — ED Notes (Signed)
Pt offered more apple juice and refused, pt states she isnt feeling any better at this time

## 2016-11-08 LAB — CYTOLOGY - PAP
Chlamydia: NEGATIVE
Diagnosis: NEGATIVE
Neisseria Gonorrhea: NEGATIVE

## 2017-01-22 IMAGING — US US OB TRANSVAGINAL
1 series · 15 of 28 positions shown · non-contrast
Comparison: None.

CLINICAL DATA: Right pelvic pain x4 days, pregnant

EXAM:
OBSTETRIC <14 WK US AND TRANSVAGINAL OB US
TECHNIQUE: Both transabdominal and transvaginal ultrasound examinations were
performed for complete evaluation of the gestation as well as the
maternal uterus, adnexal regions, and pelvic cul-de-sac.
Transvaginal technique was performed to assess early pregnancy.

[Series 1: us ob transvaginal · 37 acquisitions, 15 frames shown]
[im 1/37]
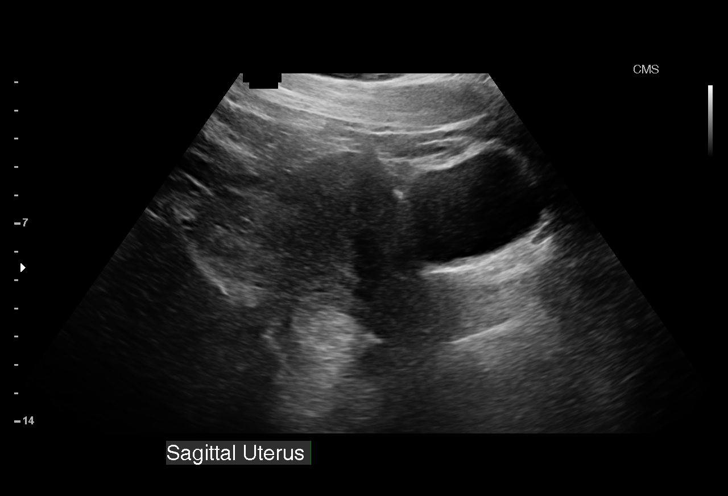
[im 3/37]
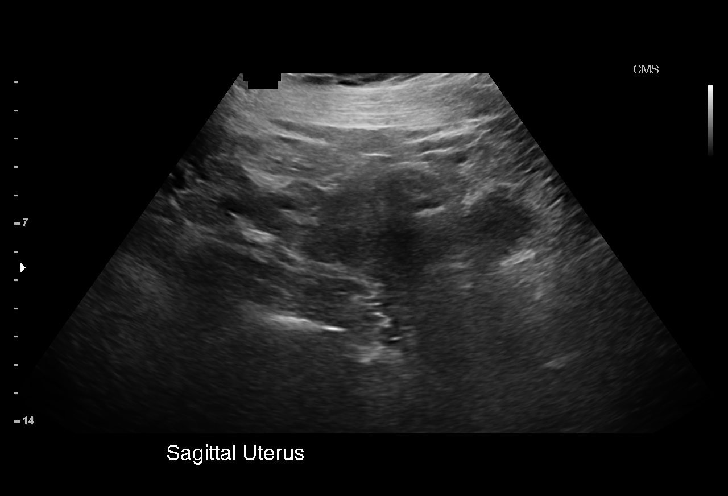
[im 6/37]
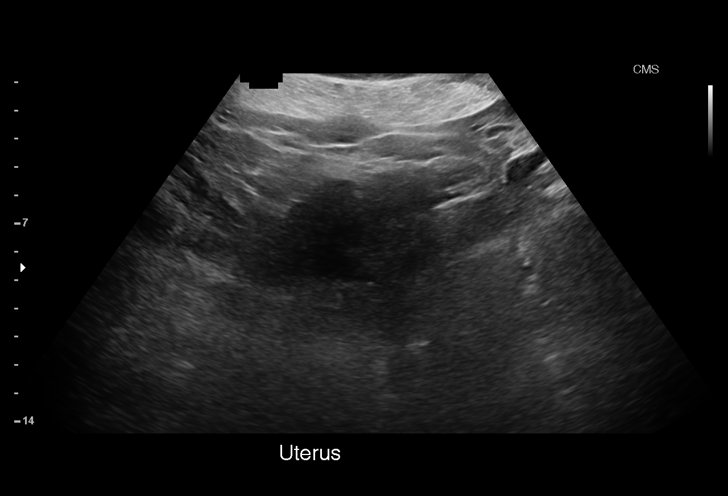
[im 9/37]
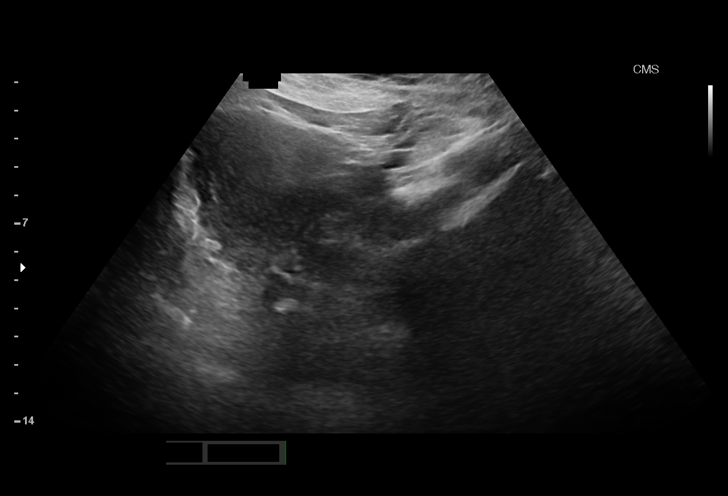
[im 11/37]
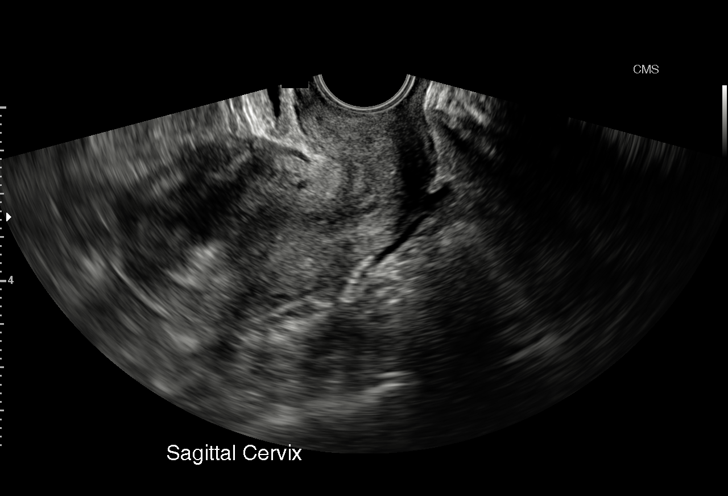
[im 14/37]
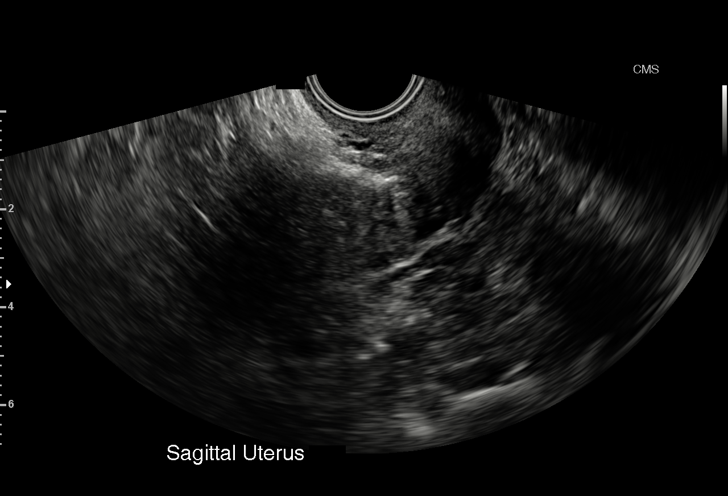
[im 17/37]
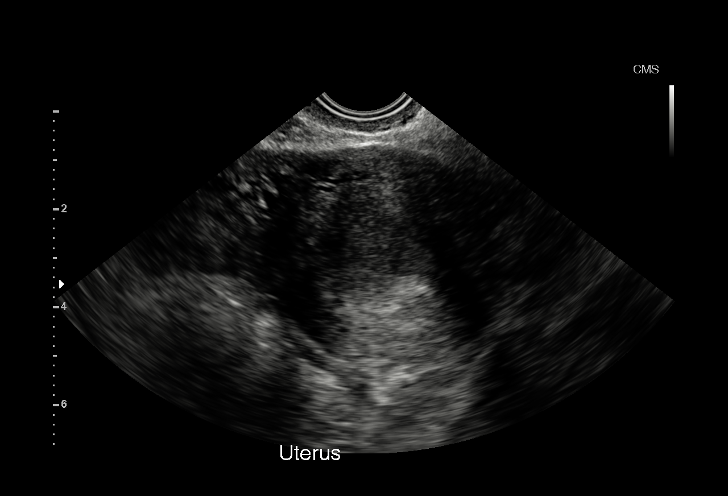
[im 19/37]
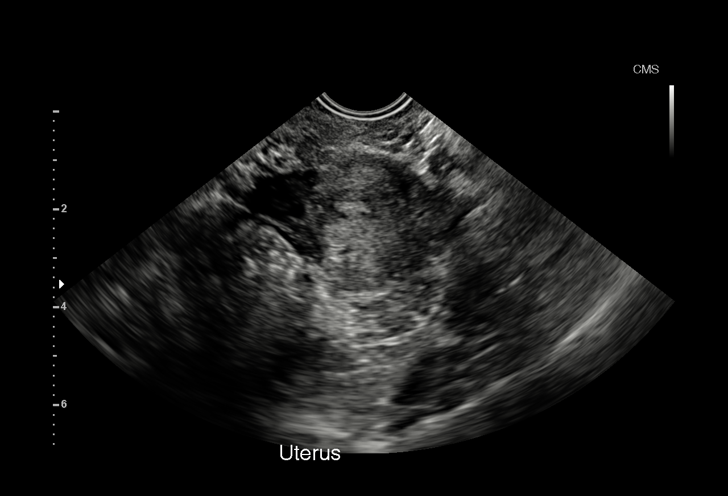
[im 21/37]
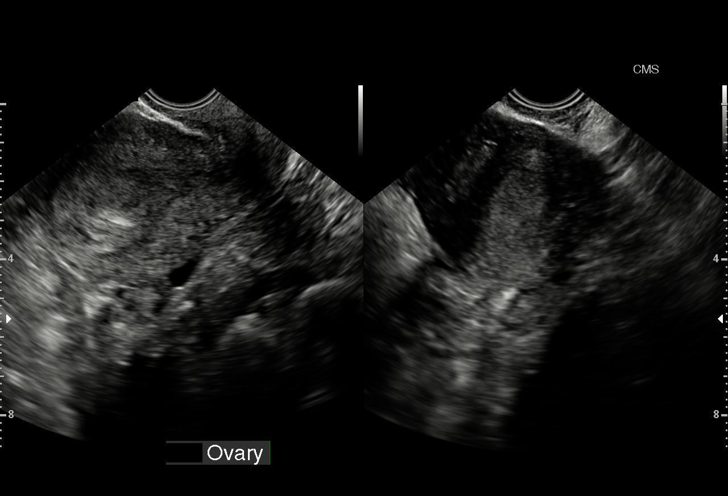
[im 23/37]
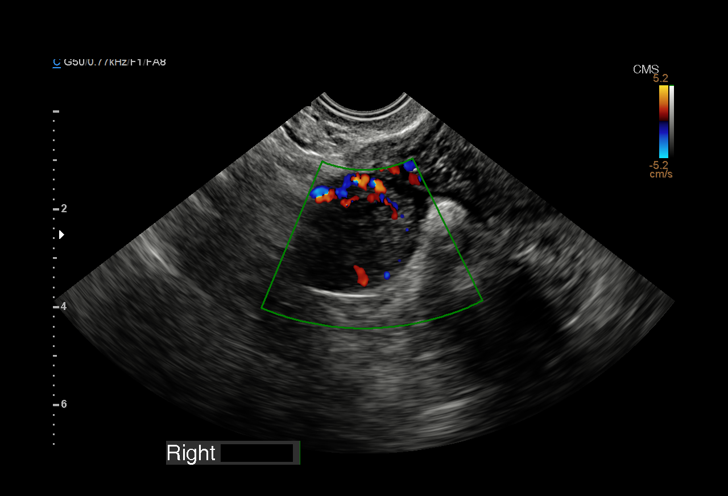
[im 26/37]
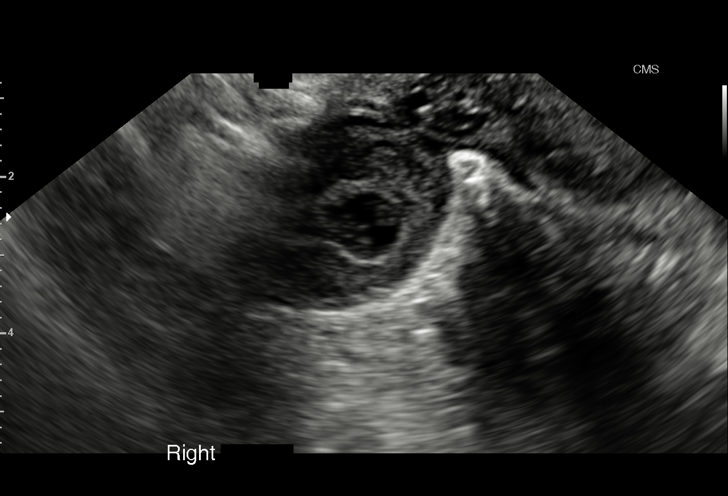
[im 29/37]
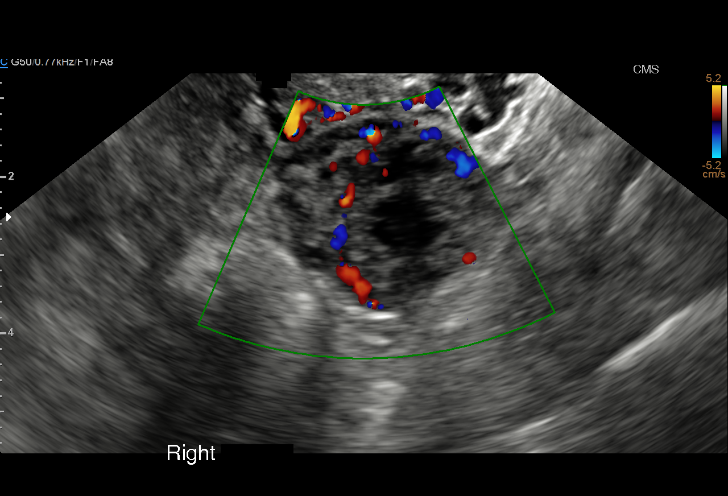
[im 31/37]
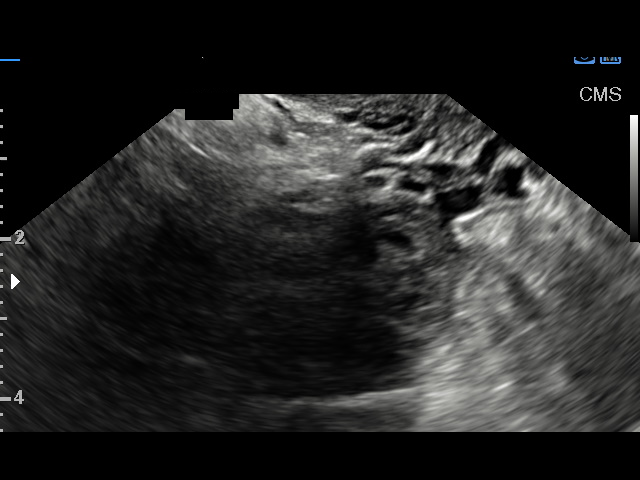
[im 34/37]
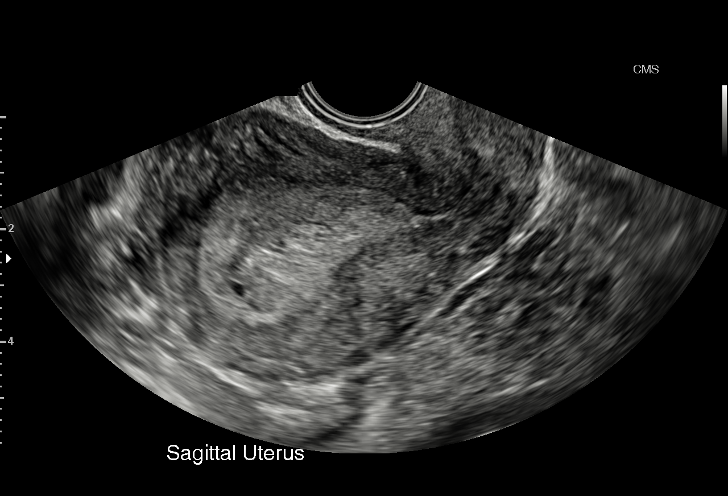
[im 37/37]
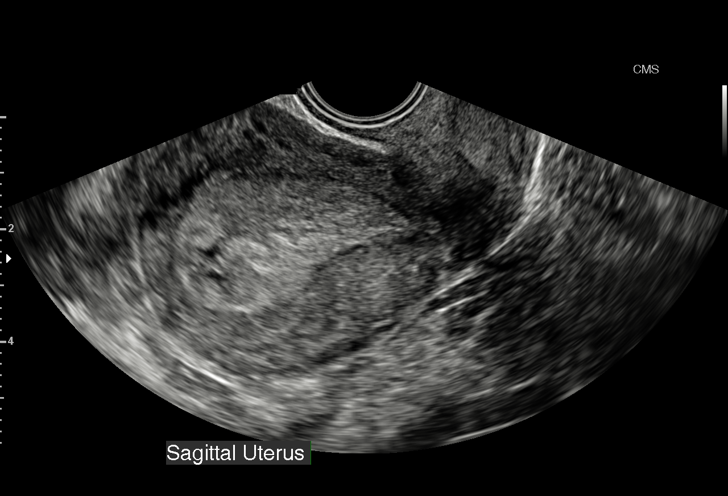

[15 of 28 positions shown; findings below may reference images not displayed]

FINDINGS: Intrauterine gestational sac: Not visualized

Maternal uterus/adnexae: Endometrial complex measures 2.3 cm.

Right ovary measures 3.3 x 2.4 x 2.5 cm and is notable for a corpus
luteal cyst.

Left ovary measures 2.2 x 1.1 x 1.4 cm.
IMPRESSION: No IUP is visualized.

By definition, this reflects a pregnancy of unknown location.
Differential considerations include early normal IUP, abnormal
IUP/missed abortion, or nonvisualized ectopic pregnancy.

Serial beta HCG is suggested, supplemented by repeat pelvic
ultrasound in 14 days (or earlier as clinically warranted).

## 2017-01-24 IMAGING — US US OB TRANSVAGINAL
1 series · 15 of 27 positions shown · non-contrast
Comparison: 05/05/2015

CLINICAL DATA: 20-year-old pregnant female with pelvic pain. No IUP
identified on ultrasound 2 days ago. Beta HCG of [DATE].

EXAM:
OBSTETRIC <14 WK US AND TRANSVAGINAL OB US
TECHNIQUE: Both transabdominal and transvaginal ultrasound examinations were
performed for complete evaluation of the gestation as well as the
maternal uterus, adnexal regions, and pelvic cul-de-sac.
Transvaginal technique was performed to assess early pregnancy.

[Series 1: us ob transvaginal · 15 of 27 slices shown]
[im 1/27]
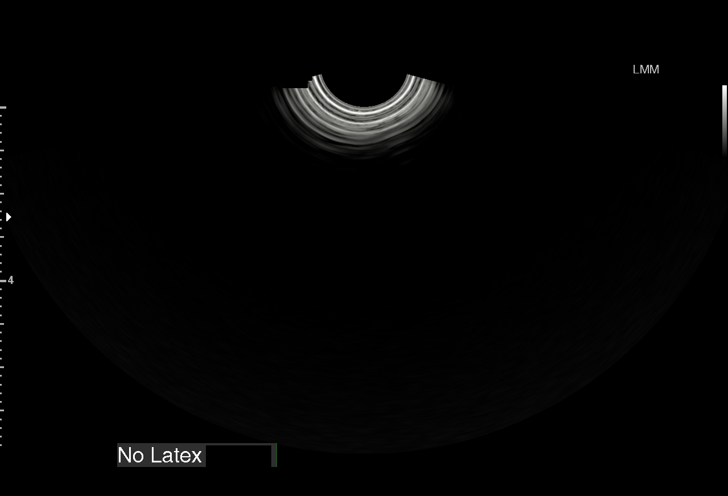
[im 3/27]
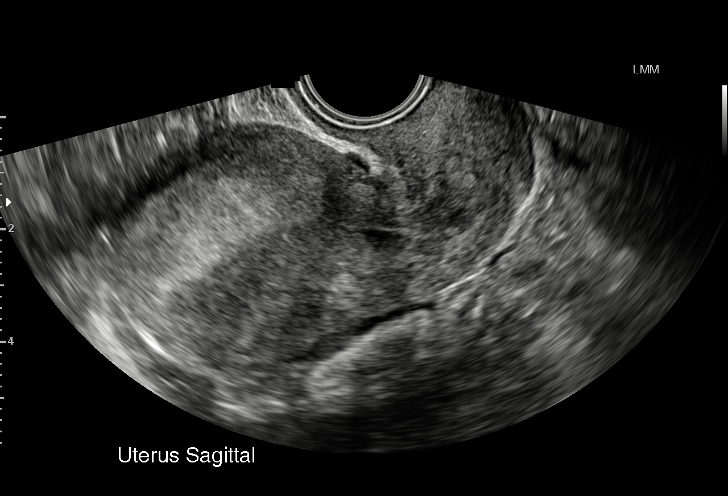
[im 5/27]
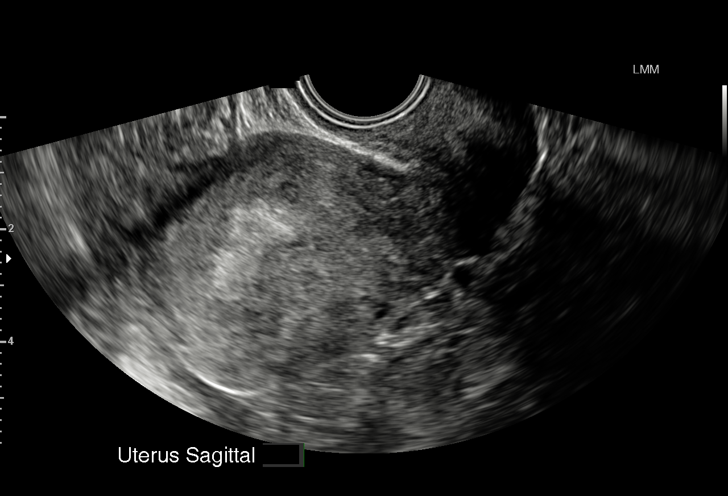
[im 7/27]
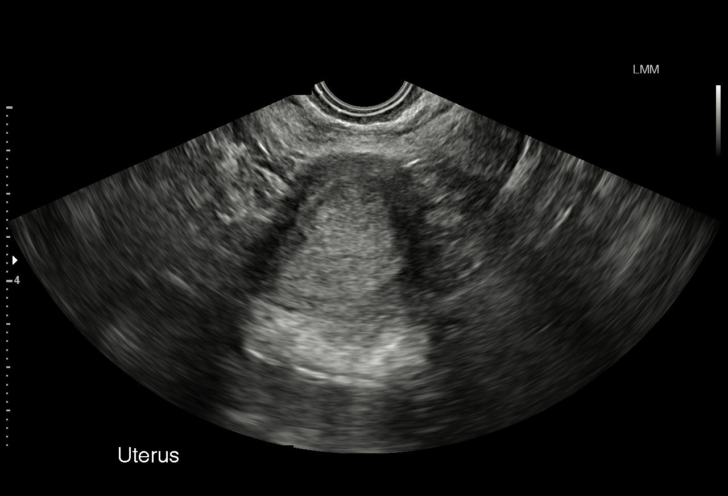
[im 9/27]
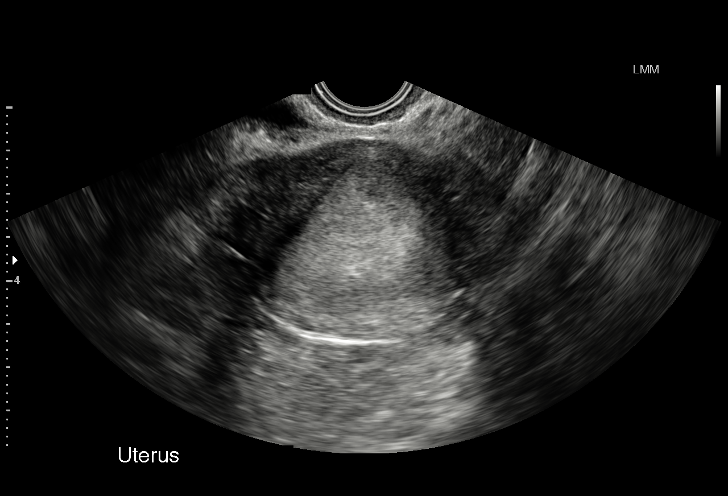
[im 10/27]
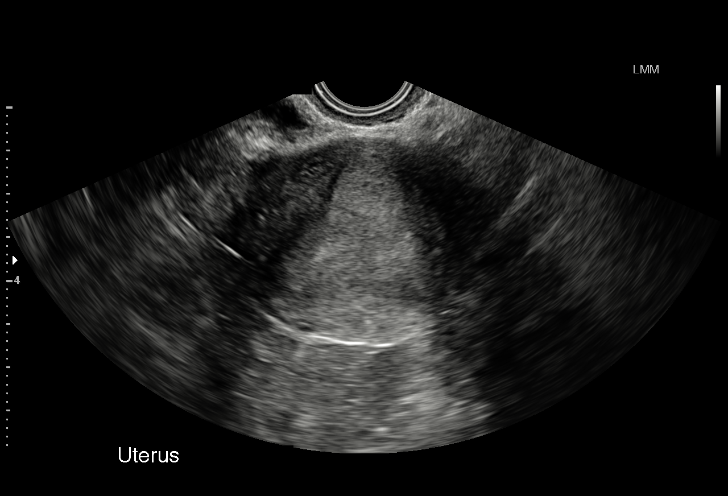
[im 12/27]
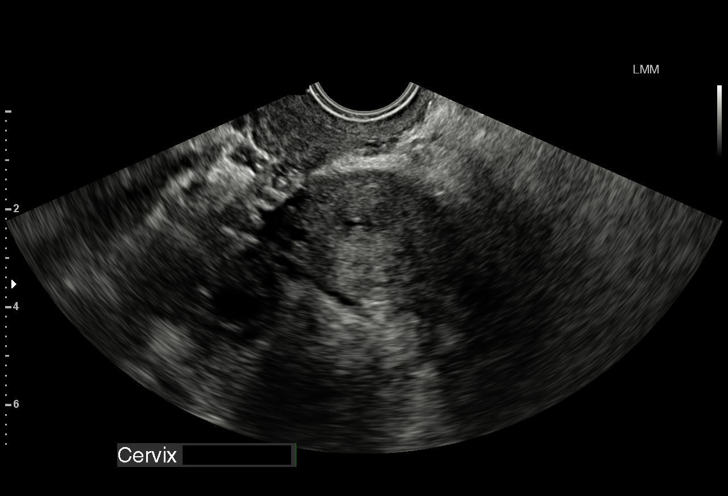
[im 14/27]
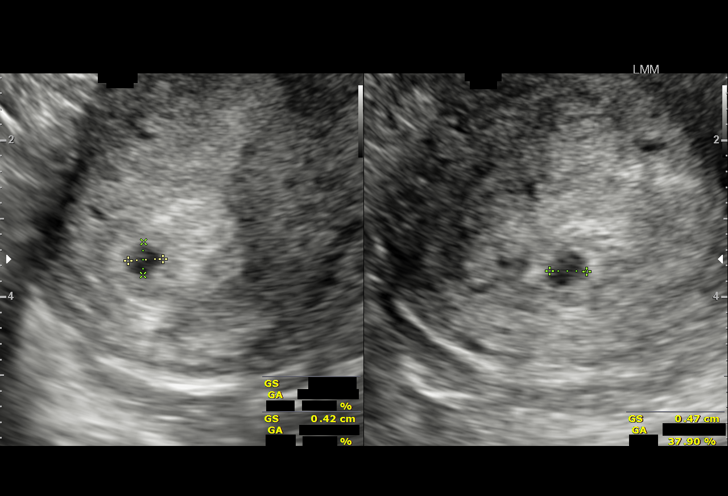
[im 16/27]
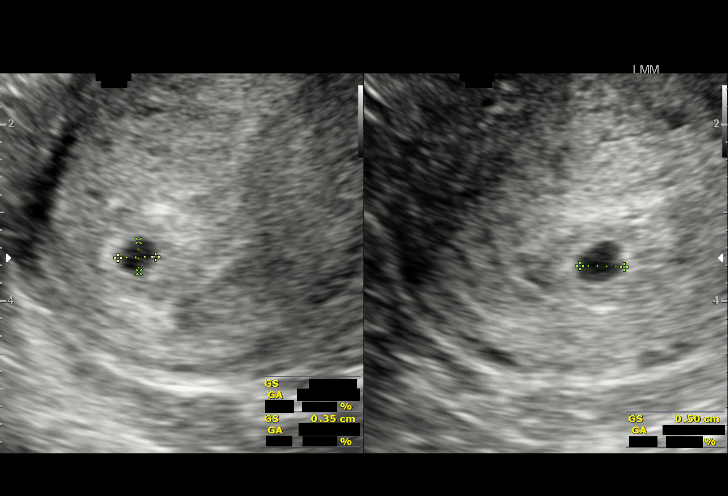
[im 18/27]
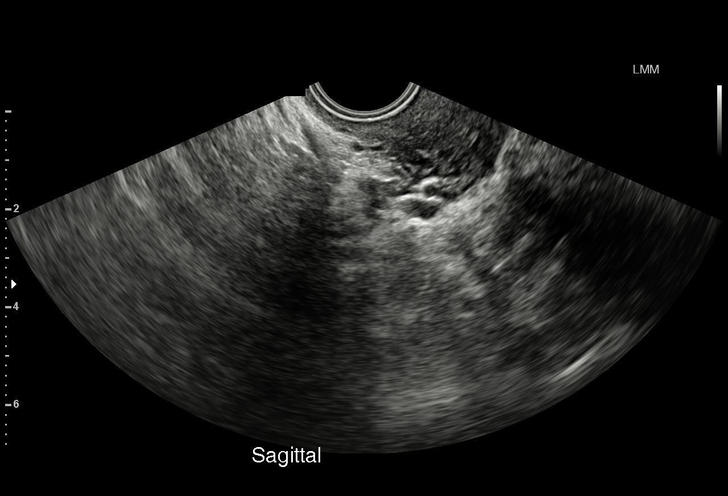
[im 19/27]
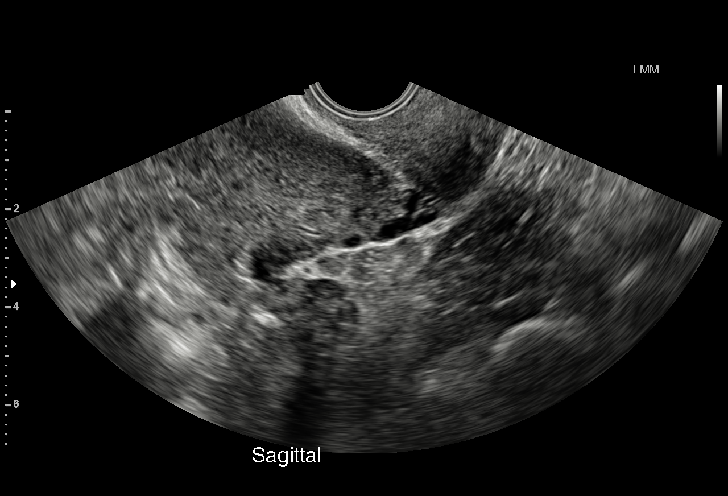
[im 21/27]
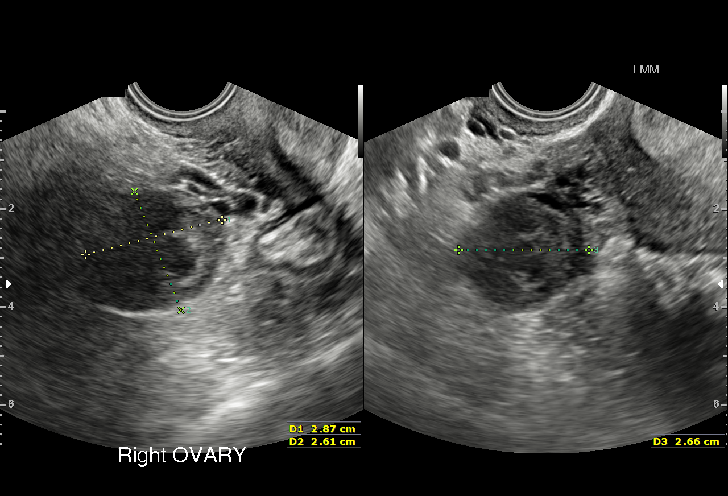
[im 23/27]
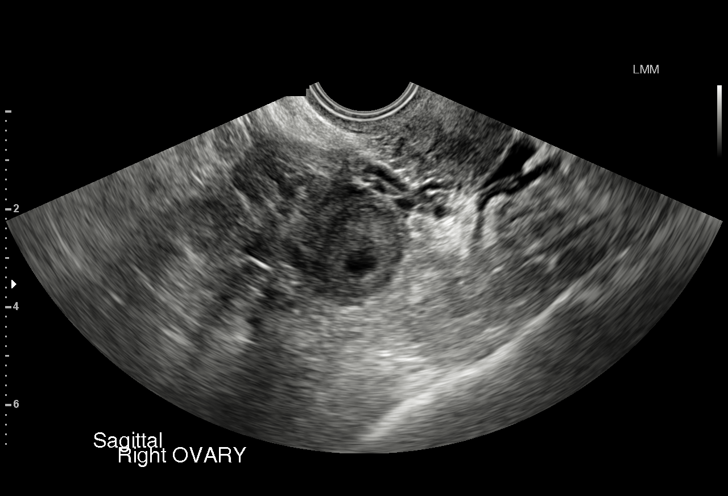
[im 25/27]
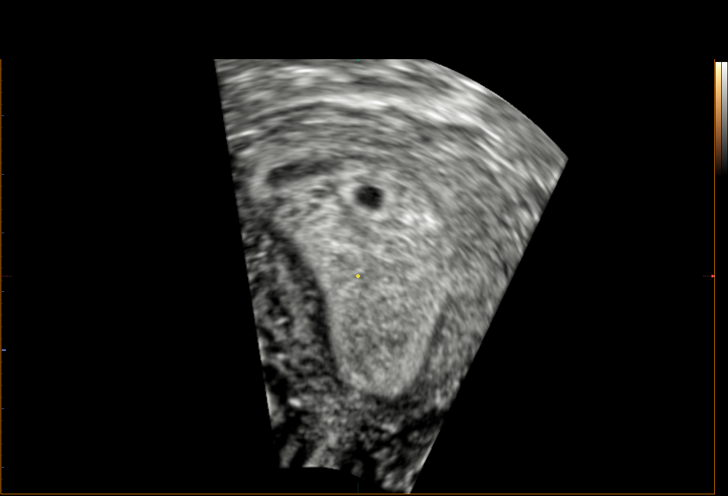
[im 27/27]
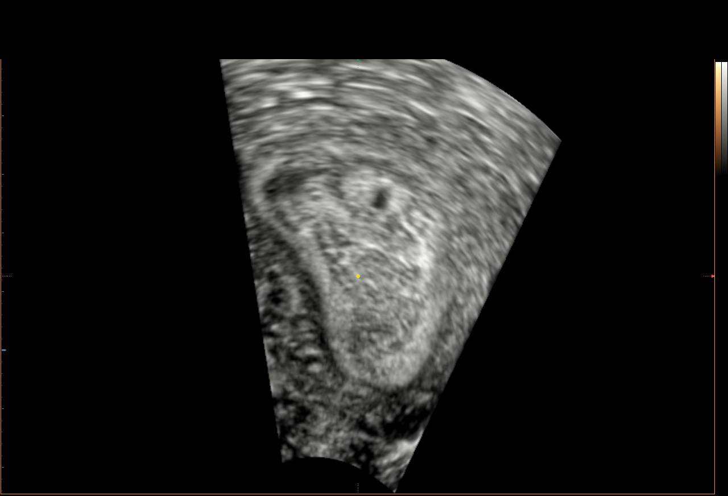

[15 of 27 positions shown; findings below may reference images not displayed]

FINDINGS: Intrauterine gestational sac: An intrauterine cystic structure is
now identified measuring 4 mm likely represents intrauterine
gestational sac.

Yolk sac:  Not visualized

Embryo:  Not visualized

MSD: 4.3  mm   5 w   1  d                US EDC: 01/06/2016

Maternal uterus/adnexae: There is no evidence of subchorionic
hemorrhage.

The right ovary is unremarkable.

The left ovary is not well visualized.

There is no evidence of free fluid or adnexal mass.
IMPRESSION: Single cystic structure within the uterus, probably an early
intrauterine gestational sac, but no yolk sac, fetal pole, or
cardiac activity yet visualized. Recommend follow-up quantitative
B-HCG levels and follow-up US in 14 days to confirm and assess
viability. This recommendation follows SRU consensus guidelines:
Diagnostic Criteria for Nonviable Pregnancy Early in the First
Trimester. N Engl J Med 8184; [DATE].

Left ovary not visualized.

## 2017-02-04 IMAGING — US US OB TRANSVAGINAL
1 series · 15 of 25 positions shown · non-contrast
Comparison: 05/07/2015

CLINICAL DATA: Inconclusive viability

EXAM:
TRANSVAGINAL OB ULTRASOUND
TECHNIQUE: Transvaginal ultrasound was performed for complete evaluation of the
gestation as well as the maternal uterus, adnexal regions, and
pelvic cul-de-sac.

[Series 1: us ob transvaginal · 25 acquisitions, 15 frames shown]
[im 1/25]
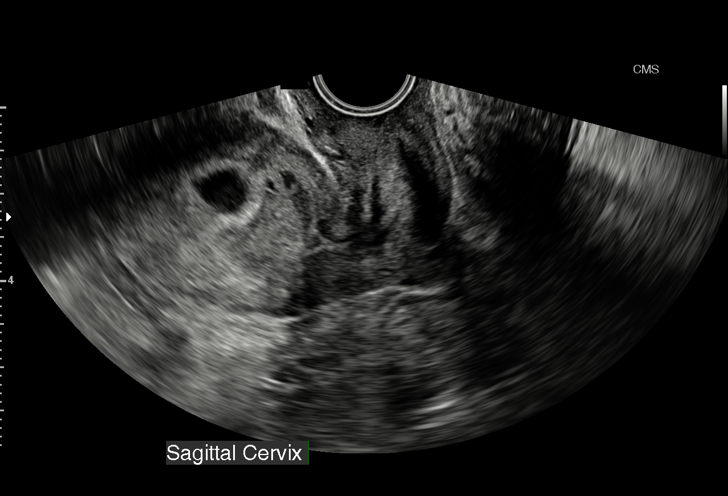
[im 3/25]
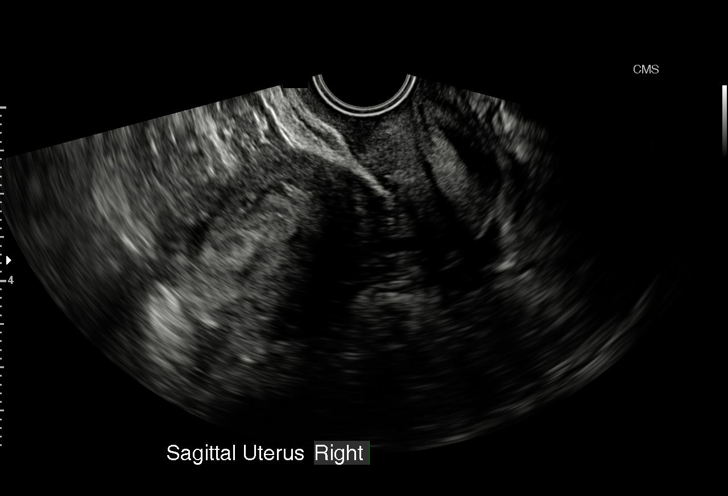
[im 5/25]
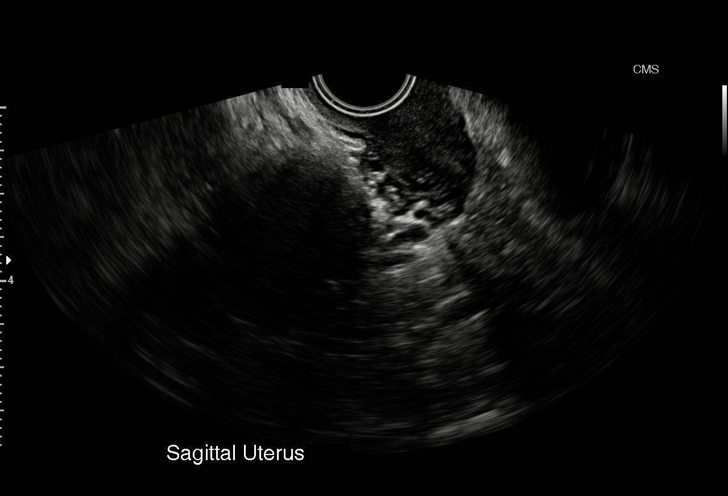
[im 6/25]
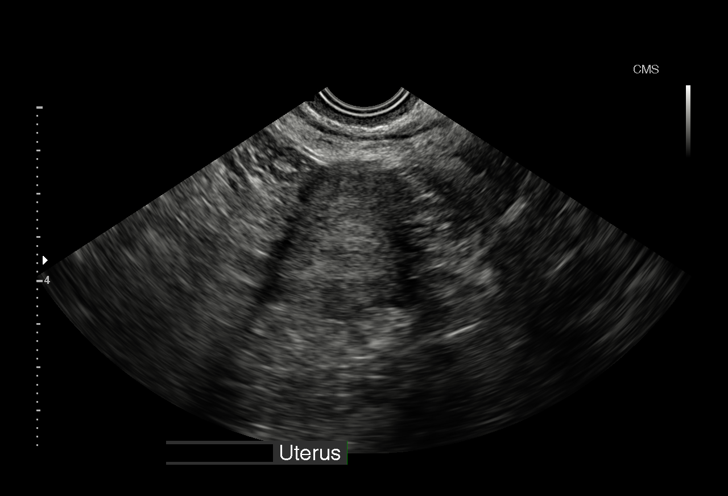
[im 8/25]
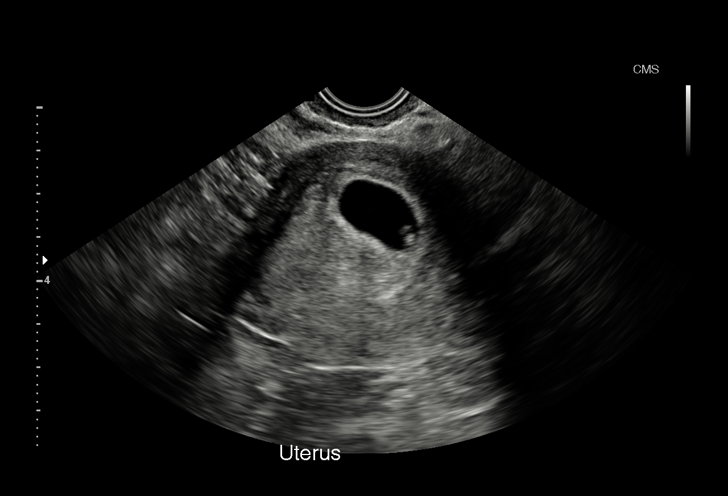
[im 10/25]
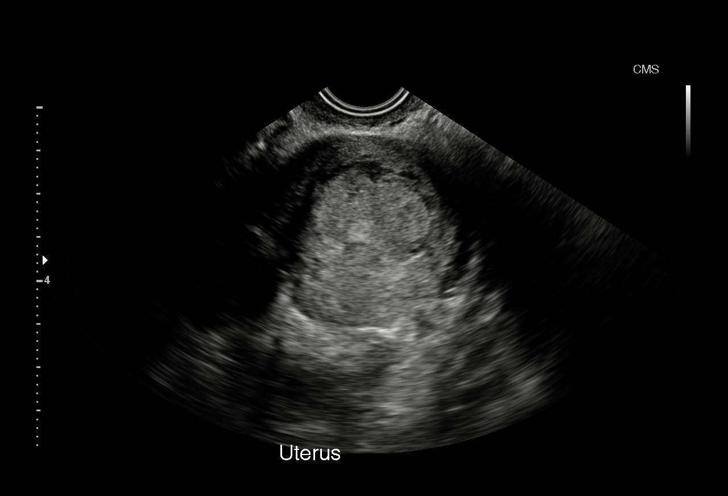
[im 11/25]
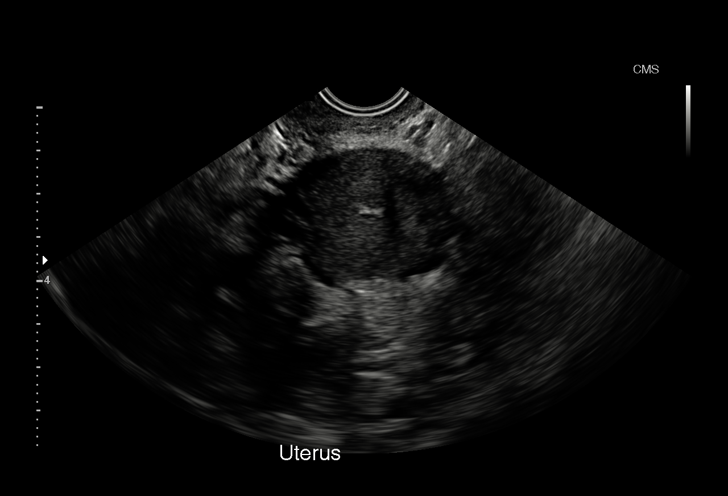
[im 13/25]
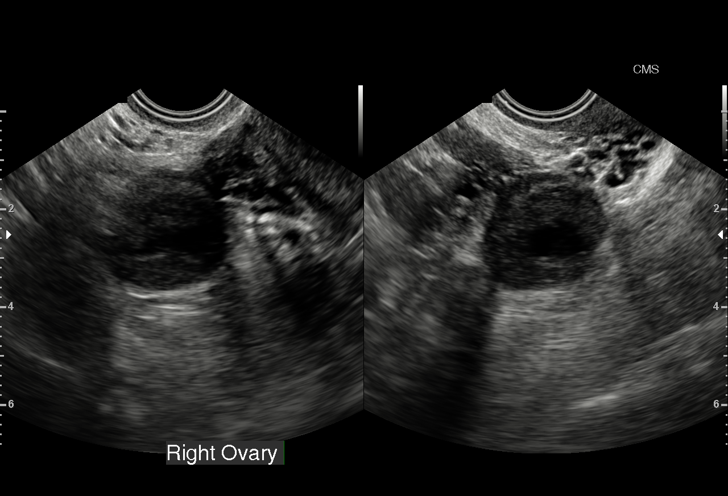
[im 15/25]
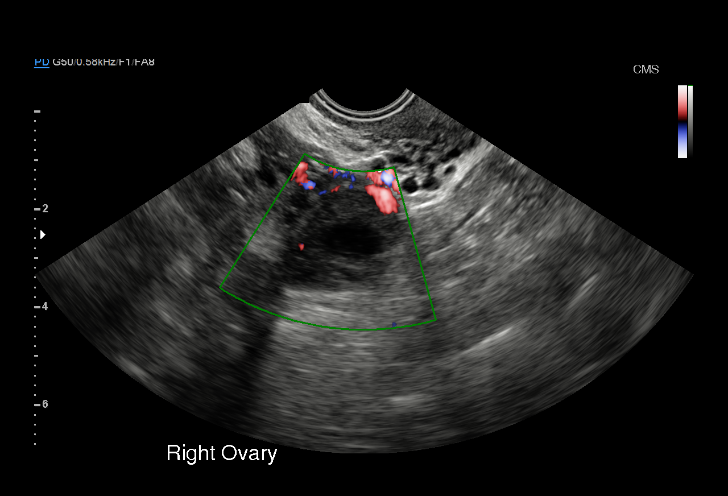
[im 16/25]
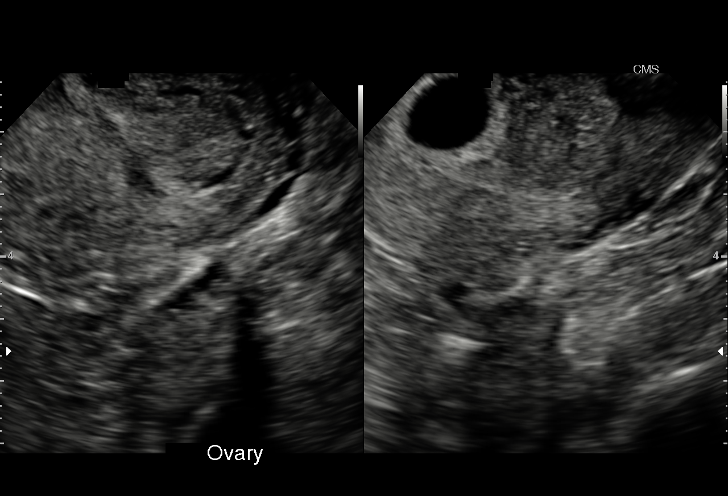
[im 18/25]
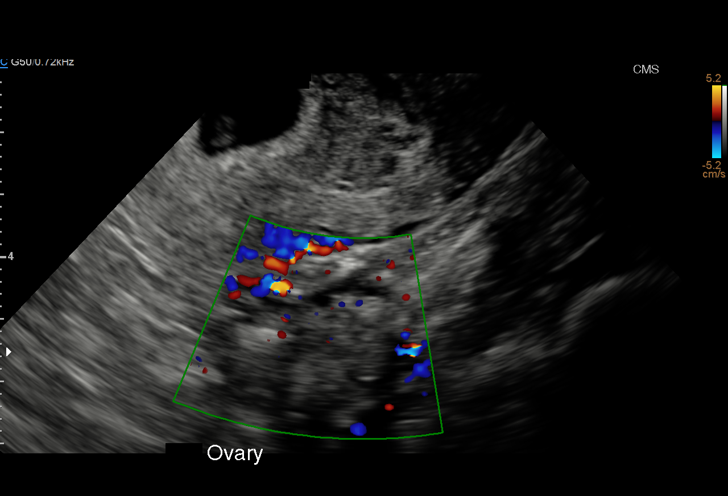
[im 20/25]
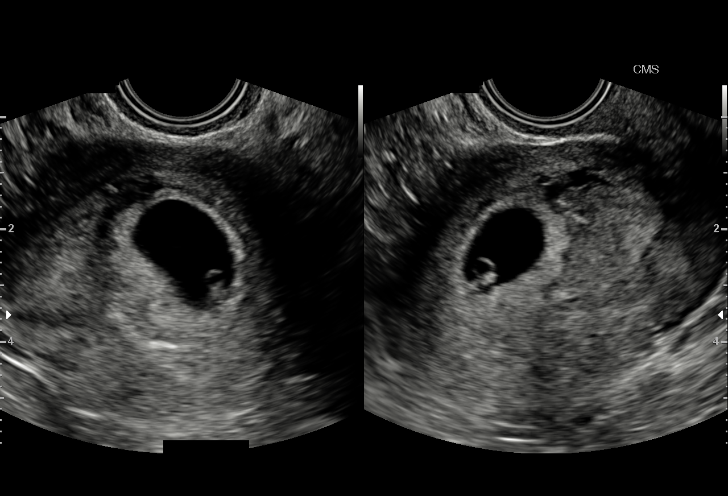
[im 21/25]
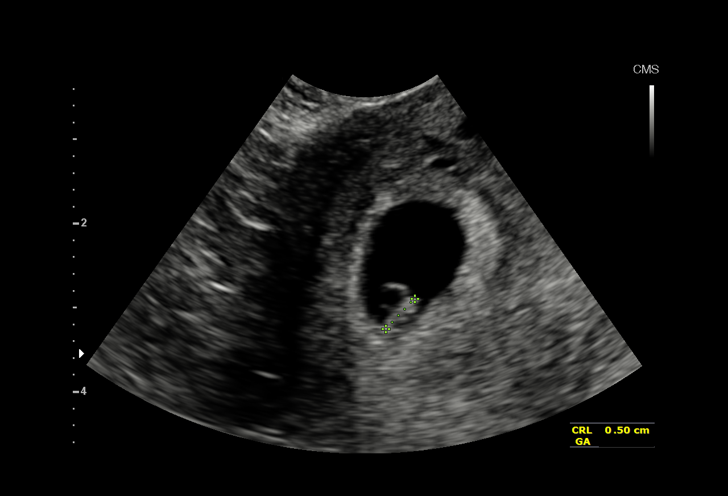
[im 23/25]
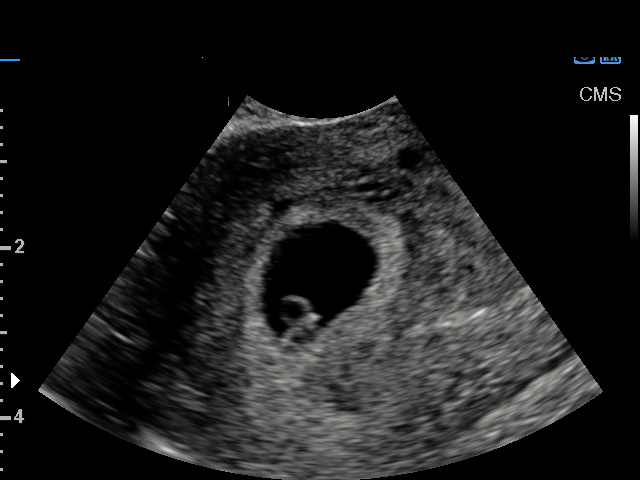
[im 25/25]
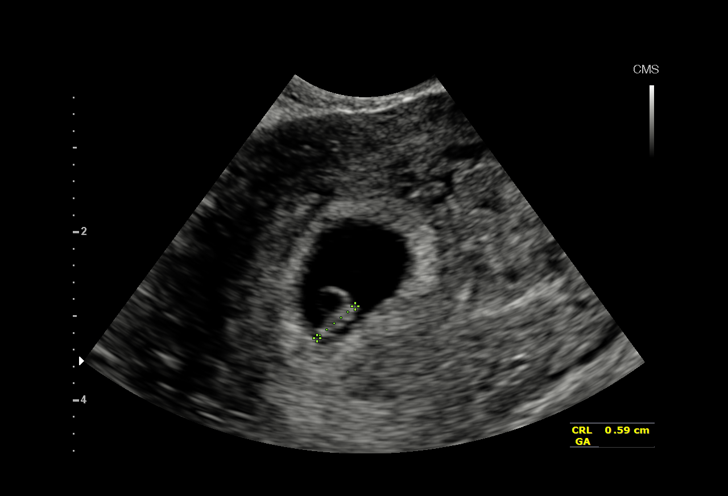

[15 of 25 positions shown; findings below may reference images not displayed]

FINDINGS: Intrauterine gestational sac: Visualized/normal in shape.

Yolk sac:  Visualized

Embryo:  Visualized

Cardiac Activity: Visualized

Heart Rate: 122 bpm

MSD:   mm    w     d

CRL:   5.8  mm   6 w 3 d                  US EDC: 01/08/2016

Subchorionic hemorrhage:  Moderate sized subchorionic hemorrhage

Maternal uterus/adnexae: No adnexal masses or free fluid.
IMPRESSION: 6 week 3 day intrauterine pregnancy. Fetal heart rate 122 beats per
minute. Moderate-sized subchorionic hemorrhage.

## 2017-06-26 IMAGING — US US MFM OB FOLLOW-UP
1 series · 14 of 28 positions shown · non-contrast
Comparison: none

[Series 1: us mfm ob follow-up · 14 of 58 slices shown]
[im 3/58]
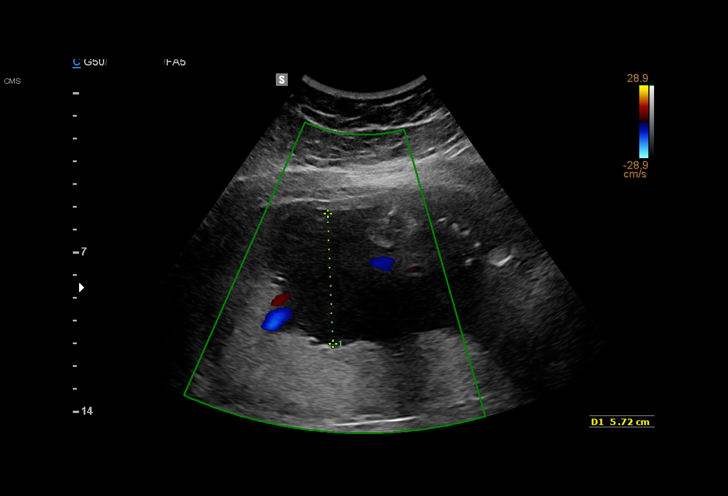
[im 7/58]
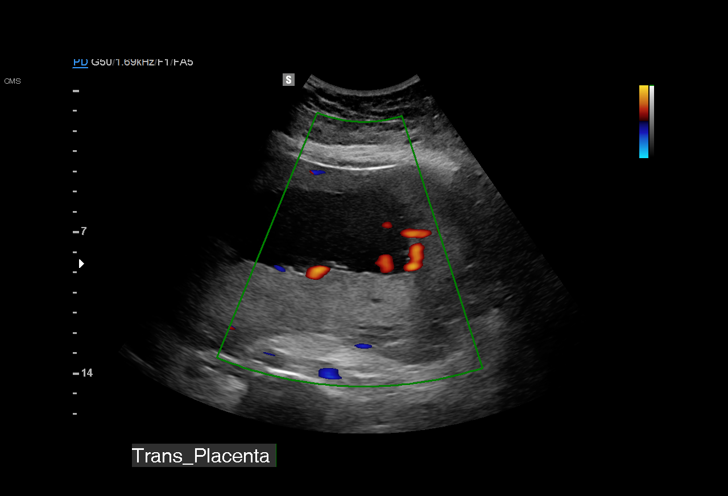
[im 11/58]
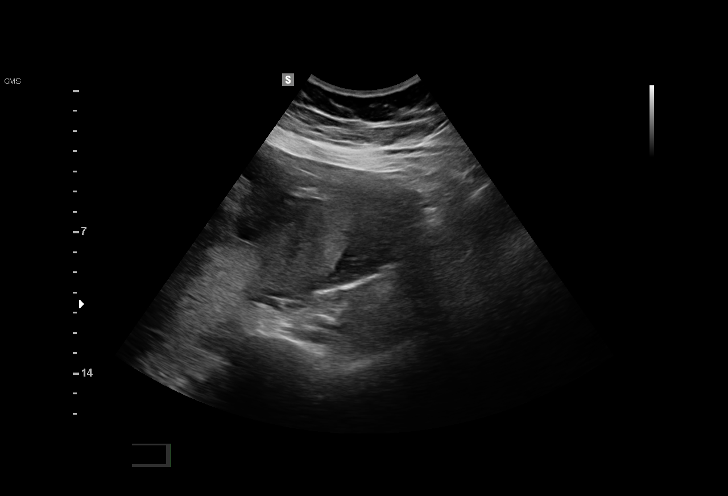
[im 15/58]
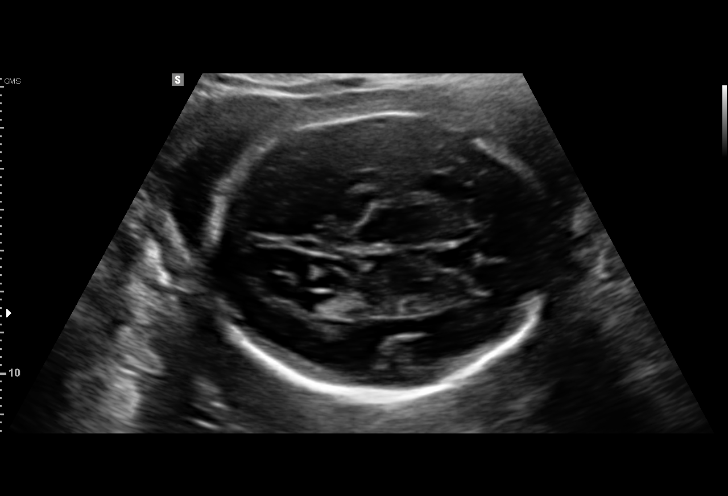
[im 20/58]
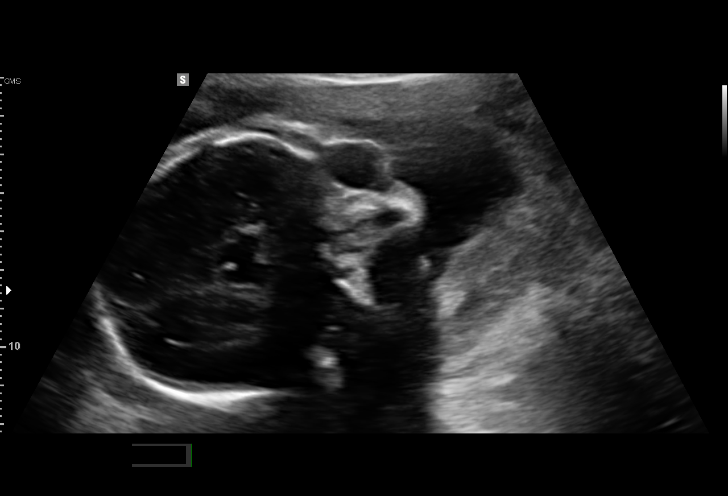
[im 24/58]
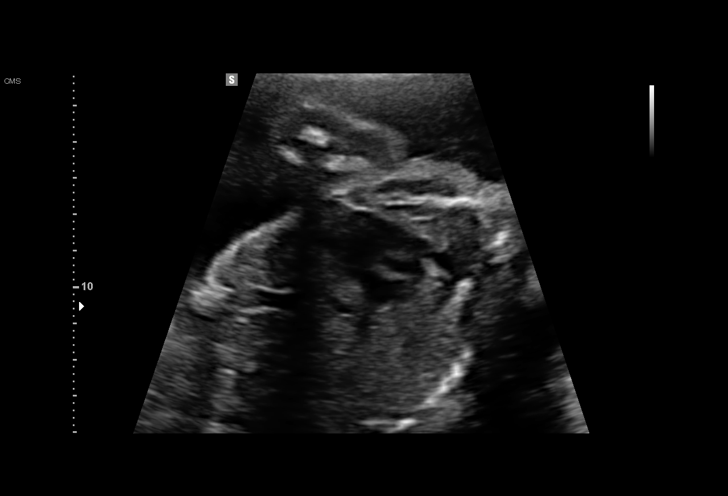
[im 28/58]
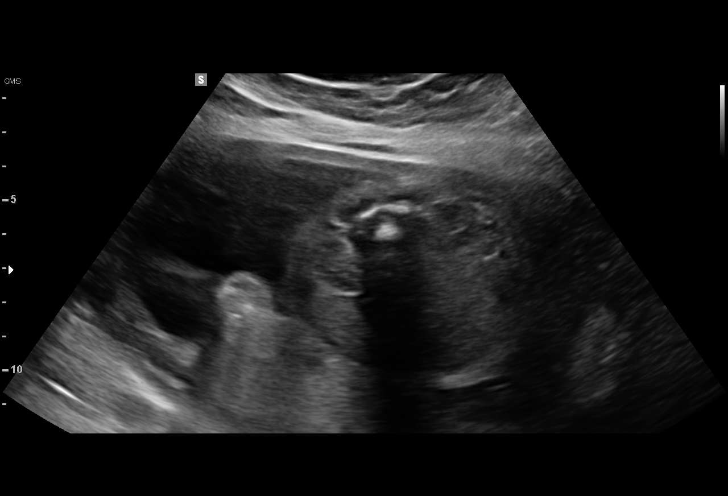
[im 32/58]
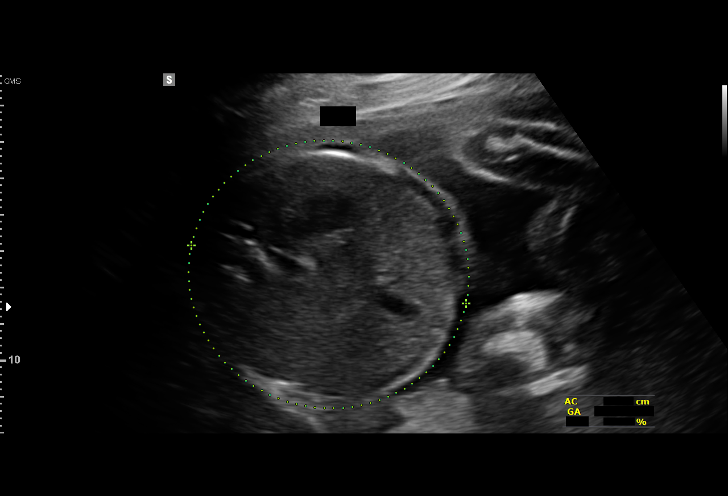
[im 36/58]
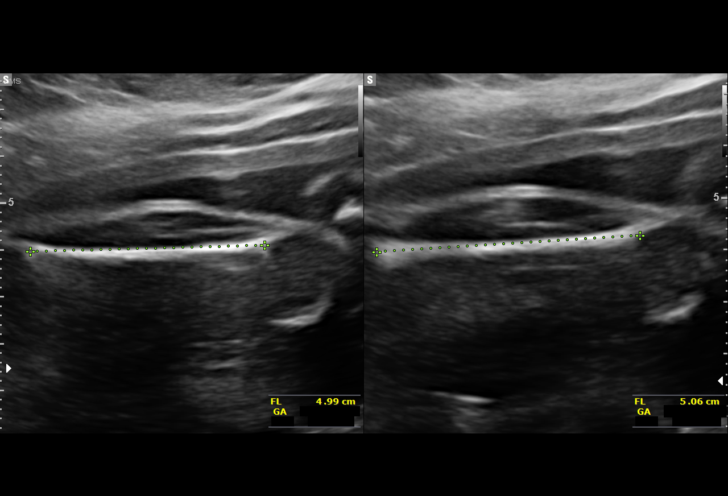
[im 41/58]
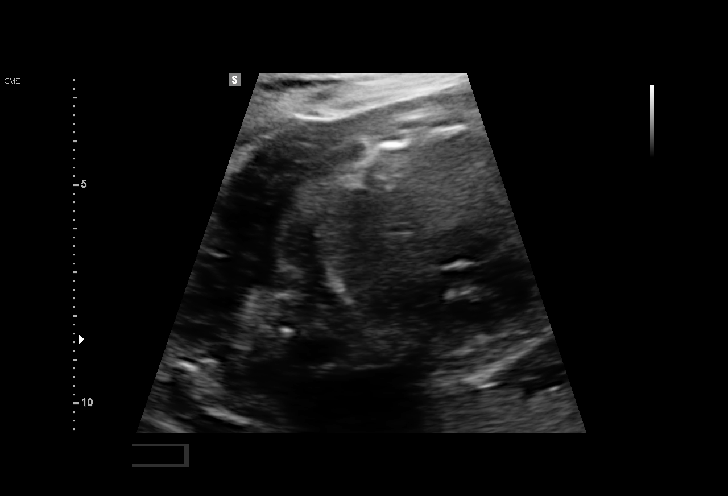
[im 45/58]
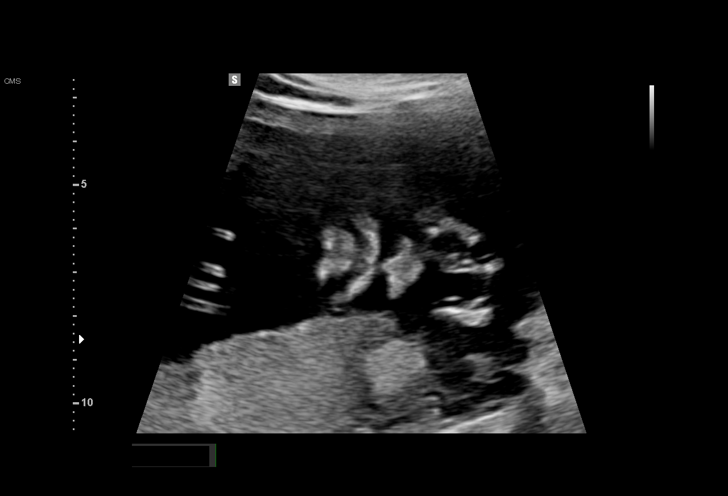
[im 49/58]
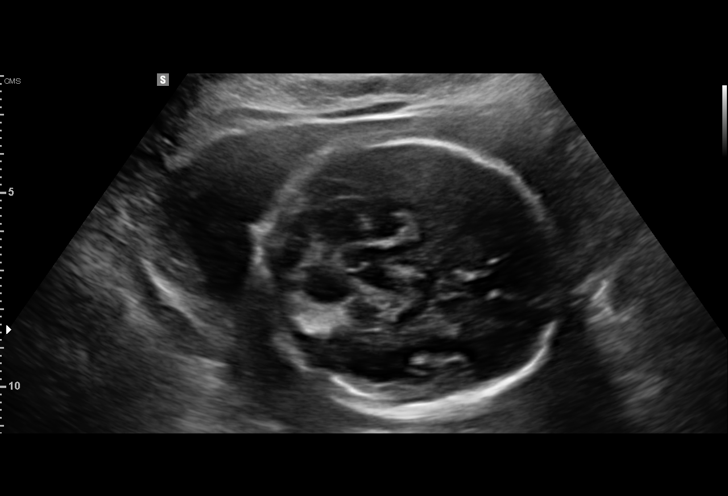
[im 53/58]
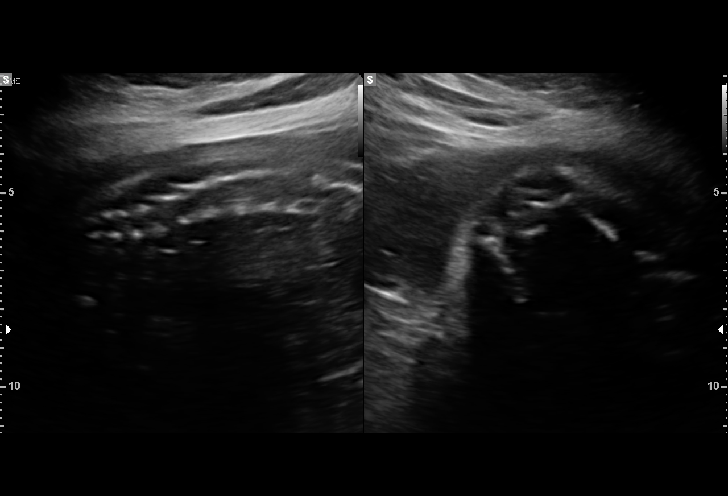
[im 58/58]
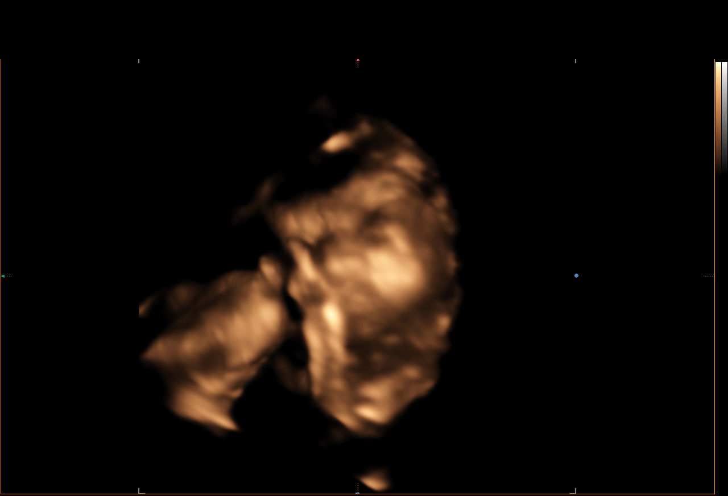

[14 of 28 positions shown; findings below may reference images not displayed]

Road [HOSPITAL]

Indications

27 weeks gestation of pregnancy
Follow-up incomplete fetal anatomic            Z36
evaluation
OB History

Gravidity:    1
Fetal Evaluation

Num Of Fetuses:     1
Fetal Heart         141
Rate(bpm):
Cardiac Activity:   Observed
Presentation:       Cephalic
Placenta:           Posterior, above cervical os
P. Cord Insertion:  Visualized, central

Amniotic Fluid
AFI FV:      Subjectively within normal limits

Largest Pocket(cm)
5.7
Biometry

BPD:      68.6  mm     G. Age:  27w 4d         59  %    CI:         73.77  %    70 - 86
FL/HC:       19.8  %    18.6 -
HC:      253.7  mm     G. Age:  27w 4d         41  %    HC/AC:       1.10       1.05 -
AC:      231.4  mm     G. Age:  27w 4d         57  %    FL/BPD:      73.3  %    71 - 87
FL:       50.3  mm     G. Age:  27w 0d         36  %    FL/AC:       21.7  %    20 - 24
HUM:        46  mm     G. Age:  27w 1d         50  %
Est. FW:    0170   gm     2 lb 5 oz     60  %
Gestational Age

LMP:           27w 0d        Date:  04/01/15                 EDD:    01/06/16
U/S Today:     27w 3d                                        EDD:    01/03/16
Best:          27w 0d     Det. By:  LMP  (04/01/15)          EDD:    01/06/16
Anatomy

Cranium:               Appears normal         Aortic Arch:            Appears normal
Cavum:                 Appears normal         Ductal Arch:            Appears normal
Ventricles:            Appears normal         Diaphragm:              Previously seen
Choroid Plexus:        Appears normal         Stomach:                Appears normal, left
sided
Cerebellum:            Appears normal         Abdomen:                Appears normal
Posterior Fossa:       Appears normal         Abdominal Wall:         Previously seen
Nuchal Fold:           Previously seen        Cord Vessels:           Appears normal (3
vessel cord)
Face:                  Appears normal         Kidneys:                Appear normal
(orbits and profile)
Lips:                  Appears normal         Bladder:                Appears normal
Thoracic:              Appears normal         Spine:                  Appears normal
Heart:                 Appears normal         Upper Extremities:      Previously seen
(4CH, axis, and situs
RVOT:                  Appears normal         Lower Extremities:      Previously seen
LVOT:                  Appears normal

Other:  Heels previously visualized. Technically difficult due to maternal
habitus and fetal position. Female gender.
Cervix Uterus Adnexa

Cervix
Length:            3.6  cm.
Normal appearance by transabdominal scan.

Uterus
No abnormality visualized.

Left Ovary
Not visualized.

Right Ovary
Not visualized.

Cul De Sac:   No free fluid seen.

Adnexa:       No abnormality visualized.
Impression

SIUP at 27+0 weeks
Normal interval anatomy; anatomic survey complete
Normal amniotic fluid volume
Appropriate interval growth with EFW at the 60th %tile
Recommendations

Follow-up as clinically indicated

## 2017-07-10 ENCOUNTER — Ambulatory Visit (HOSPITAL_COMMUNITY)
Admission: EM | Admit: 2017-07-10 | Discharge: 2017-07-10 | Disposition: A | Discharge status: Home / Self Care | Payer: Medicaid Other | Attending: Family Medicine | Admitting: Family Medicine

## 2017-07-10 ENCOUNTER — Encounter (HOSPITAL_COMMUNITY): Payer: Self-pay | Admitting: Family Medicine

## 2017-07-10 DIAGNOSIS — Z3202 Encounter for pregnancy test, result negative: Secondary | ICD-10-CM

## 2017-07-10 DIAGNOSIS — R42 Dizziness and giddiness: Secondary | ICD-10-CM | POA: Diagnosis not present

## 2017-07-10 DIAGNOSIS — R5383 Other fatigue: Secondary | ICD-10-CM | POA: Insufficient documentation

## 2017-07-10 DIAGNOSIS — R110 Nausea: Secondary | ICD-10-CM

## 2017-07-10 DIAGNOSIS — Z833 Family history of diabetes mellitus: Secondary | ICD-10-CM | POA: Diagnosis not present

## 2017-07-10 DIAGNOSIS — R1013 Epigastric pain: Secondary | ICD-10-CM | POA: Diagnosis not present

## 2017-07-10 DIAGNOSIS — R5381 Other malaise: Secondary | ICD-10-CM | POA: Diagnosis not present

## 2017-07-10 LAB — POCT H PYLORI SCREEN: H. PYLORI SCREEN, POC: NEGATIVE

## 2017-07-10 LAB — POCT I-STAT, CHEM 8
BUN: 10 mg/dL (ref 6–20)
CHLORIDE: 104 mmol/L (ref 101–111)
CREATININE: 0.6 mg/dL (ref 0.44–1.00)
Calcium, Ion: 1.17 mmol/L (ref 1.15–1.40)
Glucose, Bld: 87 mg/dL (ref 65–99)
HEMATOCRIT: 47 % — AB (ref 36.0–46.0)
Hemoglobin: 16 g/dL — ABNORMAL HIGH (ref 12.0–15.0)
Potassium: 4.5 mmol/L (ref 3.5–5.1)
Sodium: 140 mmol/L (ref 135–145)
TCO2: 26 mmol/L (ref 22–32)

## 2017-07-10 LAB — POCT URINALYSIS DIP (DEVICE)
BILIRUBIN URINE: NEGATIVE
GLUCOSE, UA: NEGATIVE mg/dL
KETONES UR: NEGATIVE mg/dL
Leukocytes, UA: NEGATIVE
Nitrite: NEGATIVE
PROTEIN: 30 mg/dL — AB
SPECIFIC GRAVITY, URINE: 1.02 (ref 1.005–1.030)
Urobilinogen, UA: 0.2 mg/dL (ref 0.0–1.0)
pH: 7 (ref 5.0–8.0)

## 2017-07-10 LAB — LIPASE, BLOOD: Lipase: 24 U/L (ref 11–51)

## 2017-07-10 LAB — POCT PREGNANCY, URINE: PREG TEST UR: NEGATIVE

## 2017-07-10 NOTE — ED Triage Notes (Signed)
Pt here for epigastric pain, upper back pain and dizziness since yesterday. sts some nausea. She has a hx of anemia. LMP last week and normal.

## 2017-07-10 NOTE — Discharge Instructions (Signed)
Hydrate well with at least 2 liters (1 gallon) of water daily. Salads - kale, spinach, cabbage, spring mix; use seeds like pumpkin seeds or sunflower seeds; you can also use 1-2 hard boiled eggs. Fruits - avocadoes, berries (blueberries, raspberries, blackberries), apples, oranges, pomegranate, grapefruit Seeds - quinoa, chia seeds; you can also incorporate oatmeal Vegetables - aspargus, cauliflower, broccoli, green beans, brussel spouts, bell peppers; stay away from starchy vegetables like potatoes, carrots, peas   Brussel sprouts - Cut off stems. Place in a mixing bowl that has a lid. Pour in a 1/4-1/2 cup olive oil, spices, use a light amount of parmesan. Place on a baking sheet. Bake for 10 minutes at 400F. Take it out, eat the brussel chips. Place for another 5-10 minutes.   Mashed cauliflower - Boil a bunch of cauliflower in a pot of water. Blend in a food processor with 1-2 tablespoons of butter.  Spaghetti squash -  Cut the squash in half very carefully, clean out seeds from the middle. Place 1/2 face down in a microwave safe dish with at least 2 inches of water. Make 4-6 slits on outside of spaghetti squash and microwave for 10-12 minutes. Take out the spaghetti using a metal spoon. Repeat for the other half.    Vega protein is good protein powder, make sure you use ~6 ice cubes to give it smoothie consistency together with ~4-6 ounces of vanilla soy milk. Throw cinnamon into your shake, use peanut butter. You can also use the fruits that I listed above. Throw spinach or kale into the shake.

## 2017-07-10 NOTE — ED Provider Notes (Signed)
MRN: [id number] DOB: 04/29/1995  Subjective:   Lisa Herrera is a 22 y.o. female presenting for several week history of persistent fatigue, intermittent dizziness, epigastric pain that radiates into her back, nausea without vomiting, constipation, general malaise.  Patient has not tried medications for relief.  She is worried that she has diabetes as well as her family members have diabetes.  Diet is very unhealthy, patient tries to hydrate with about 32 ounces of water daily.  She does not exercise.  Denies fever, chest pain, shortness of breath, dysuria, hematuria, bloody stools.  LMP was 1 week ago and was regular.  She is not currently taking any medications and has No Known Allergies.  Past Medical History:  Diagnosis Date  . Gallstones   . Medical history non-contributory      Past Surgical History:  Procedure Laterality Date  . CESAREAN SECTION N/A 01/14/2016   Procedure: CESAREAN SECTION;  Surgeon: Tereso Newcomer, MD;  Location: [facility];  Service: Obstetrics;  Laterality: N/A;  . NO PAST SURGERIES      Objective:   Vitals: LMP 07/03/2017   Physical Exam  Constitutional: She is oriented to person, place, and time. She appears well-developed and well-nourished.  Body habitus is morbidly obese.  HENT:  Mouth/Throat: Oropharynx is clear and moist.  Cardiovascular: Normal rate, regular rhythm and intact distal pulses. Exam reveals no gallop and no friction rub.  No murmur heard. Pulmonary/Chest: No respiratory distress. She has no wheezes. She has no rales.  Abdominal: Soft. Bowel sounds are normal. She exhibits no distension and no mass. There is tenderness (mild) in the epigastric area.  Musculoskeletal: She exhibits no edema.  Neurological: She is alert and oriented to person, place, and time.  Skin: Skin is warm and dry. No rash noted. No erythema. No pallor.   Results for orders placed or performed during the hospital encounter of 07/10/17 (from the past 24  hour(s))  POCT urinalysis dip (device)     Status: Abnormal   Collection Time: 07/10/17 12:48 PM  Result Value Ref Range   Glucose, UA NEGATIVE NEGATIVE mg/dL   Bilirubin Urine NEGATIVE NEGATIVE   Ketones, ur NEGATIVE NEGATIVE mg/dL   Specific Gravity, Urine 1.020 1.005 - 1.030   Hgb urine dipstick TRACE (A) NEGATIVE   pH 7.0 5.0 - 8.0   Protein, ur 30 (A) NEGATIVE mg/dL   Urobilinogen, UA 0.2 0.0 - 1.0 mg/dL   Nitrite NEGATIVE NEGATIVE   Leukocytes, UA NEGATIVE NEGATIVE  I-STAT, chem 8     Status: Abnormal   Collection Time: 07/10/17  1:01 PM  Result Value Ref Range   Sodium 140 135 - 145 mmol/L   Potassium 4.5 3.5 - 5.1 mmol/L   Chloride 104 101 - 111 mmol/L   BUN 10 6 - 20 mg/dL   Creatinine, Ser 0.86 0.44 - 1.00 mg/dL   Glucose, Bld 87 65 - 99 mg/dL   Calcium, Ion 5.78 4.69 - 1.40 mmol/L   TCO2 26 22 - 32 mmol/L   Hemoglobin 16.0 (H) 12.0 - 15.0 g/dL   HCT 62.9 (H) 52.8 - 41.3 %  H.pylori screen, POC     Status: None   Collection Time: 07/10/17  1:04 PM  Result Value Ref Range   H. PYLORI SCREEN, POC NEGATIVE NEGATIVE  Pregnancy, urine POC     Status: None   Collection Time: 07/10/17  1:04 PM  Result Value Ref Range   Preg Test, Ur NEGATIVE NEGATIVE   Assessment and Plan :  Epigastric pain  Dizziness  Nausea without vomiting  Malaise  Family history of diabetes mellitus  Fatigue, unspecified type  Recommended patient follow-up with her PCP to rule out hypothyroidism, A1c testing.  Emphasized need for lifestyle modifications including healthy diet and exercise to avoid diagnosis of diabetes given her strong family history.  Plan of care testing today is very reassuring.  Lipase level pending.  Follow-up as needed.   Wallis BambergMani, Arelia Volpe, PA-C 07/10/17 1314

## 2017-07-11 ENCOUNTER — Telehealth (HOSPITAL_COMMUNITY): Payer: Self-pay

## 2017-07-11 NOTE — Telephone Encounter (Signed)
Results are within normal range. Pt contacted and made aware. Verbalized understanding.   

## 2017-08-28 DIAGNOSIS — J029 Acute pharyngitis, unspecified: Secondary | ICD-10-CM | POA: Diagnosis not present

## 2017-08-28 DIAGNOSIS — R05 Cough: Secondary | ICD-10-CM | POA: Diagnosis not present

## 2017-08-28 DIAGNOSIS — R0981 Nasal congestion: Secondary | ICD-10-CM | POA: Insufficient documentation

## 2017-08-29 ENCOUNTER — Encounter (HOSPITAL_COMMUNITY): Payer: Self-pay

## 2017-08-29 ENCOUNTER — Emergency Department (HOSPITAL_COMMUNITY)
Admission: EM | Admit: 2017-08-29 | Discharge: 2017-08-29 | Disposition: A | Discharge status: Home / Self Care | Payer: Medicaid Other | Attending: Emergency Medicine | Admitting: Emergency Medicine

## 2017-08-29 ENCOUNTER — Other Ambulatory Visit: Payer: Self-pay

## 2017-08-29 ENCOUNTER — Emergency Department (HOSPITAL_COMMUNITY): Payer: Medicaid Other

## 2017-08-29 DIAGNOSIS — R059 Cough, unspecified: Secondary | ICD-10-CM

## 2017-08-29 DIAGNOSIS — R0981 Nasal congestion: Secondary | ICD-10-CM

## 2017-08-29 DIAGNOSIS — R05 Cough: Secondary | ICD-10-CM

## 2017-08-29 DIAGNOSIS — J029 Acute pharyngitis, unspecified: Secondary | ICD-10-CM

## 2017-08-29 LAB — GROUP A STREP BY PCR: Group A Strep by PCR: NOT DETECTED

## 2017-08-29 MED ORDER — AMOXICILLIN 500 MG PO CAPS: 500.0000 mg | ORAL_CAPSULE | Freq: Three times a day (TID) | ORAL | 0 refills | Status: DC

## 2017-08-29 MED ORDER — FLUTICASONE PROPIONATE 50 MCG/ACT NA SUSP: 1.0000 | Freq: Every day | NASAL | 2 refills | Status: DC

## 2017-08-29 MED ORDER — AMOXICILLIN 500 MG PO CAPS
500.0000 mg | ORAL_CAPSULE | Freq: Once | ORAL | Status: AC
  Administered 2017-08-29: 500 mg via ORAL
  Filled 2017-08-29: qty 1

## 2017-08-29 NOTE — ED Triage Notes (Signed)
Pt reports for the past week, pain in the L ear, sore throat, dry cough, fevers on and off

## 2017-08-29 NOTE — ED Notes (Signed)
ED Provider at bedside. 

## 2017-08-29 NOTE — ED Provider Notes (Signed)
[facility] EMERGENCY DEPARTMENT Provider Note   CSN: [id number] Arrival date & time: 08/28/17  2359     History   Chief Complaint Chief Complaint  Patient presents with  . URI    HPI Lyne Khurana is a 22 y.o. female.  The history is provided by the patient and medical records.    22 year old female with history of gallstones, presenting to the ED for URI type symptoms for the past week.  Specifically she has had bilateral ear pain, nasal congestion, sore throat, and dry cough.  States she has felt feverish but has not checked her temperature.  No chest pain or shortness of breath.  No nausea/vomiting/diarrhea.  She has not tried any medications for symptoms at home.  Past Medical History:  Diagnosis Date  . Gallstones   . Medical history non-contributory     Patient Active Problem List   Diagnosis Date Noted  . S/P cesarean section for arrest of descent 01/13/2016  . Maternal varicella, non-immune 01/12/2016  . Supervision of normal first pregnancy in third trimester 07/04/2015    Past Surgical History:  Procedure Laterality Date  . CESAREAN SECTION N/A 01/14/2016   Procedure: CESAREAN SECTION;  Surgeon: Tereso Newcomer, MD;  Location: [facility];  Service: Obstetrics;  Laterality: N/A;  . NO PAST SURGERIES       OB History    Gravida  1   Para  1   Term  1   Preterm      AB      Living  1     SAB      TAB      Ectopic      Multiple  0   Live Births  1            Home Medications    Prior to Admission medications   Medication Sig Start Date End Date Taking? Authorizing Provider  promethazine-phenylephrine (PROMETHAZINE VC) 6.25-5 MG/5ML SYRP Take 5 mLs by mouth every 8 (eight) hours as needed for congestion. 10/27/16   Janne Napoleon, NP    Family History No family history on file.  Social History Social History   Tobacco Use  . Smoking status: Never Smoker  . Smokeless tobacco: Never Used  Substance Use  Topics  . Alcohol use: No    Alcohol/week: 0.0 oz  . Drug use: No     Allergies   Patient has no known allergies.   Review of Systems Review of Systems  HENT: Positive for congestion, ear pain, postnasal drip and sore throat.   All other systems reviewed and are negative.    Physical Exam Updated Vital Signs BP 119/62   Pulse 89   Temp 98.5 F (36.9 C)   Resp (!) 21   LMP 08/08/2017   SpO2 98%   Physical Exam  Constitutional: She is oriented to person, place, and time. She appears well-developed and well-nourished.  HENT:  Head: Normocephalic and atraumatic.  Right Ear: Tympanic membrane and ear canal normal.  Left Ear: Tympanic membrane and ear canal normal.  Nose: Mucosal edema present.  Mouth/Throat: Uvula is midline and mucous membranes are normal. Posterior oropharyngeal erythema present.  Tonsils 2+ bilaterally without exudate; uvula midline without evidence of peritonsillar abscess; handling secretions appropriately; no difficulty swallowing or speaking; normal phonation without stridor + nasal congestion, PND  Eyes: Pupils are equal, round, and reactive to light. Conjunctivae and EOM are normal.  Neck: Normal range of motion.  Cardiovascular:  Normal rate, regular rhythm and normal heart sounds.  Pulmonary/Chest: Effort normal and breath sounds normal. No stridor. No respiratory distress. She has no wheezes. She has no rhonchi.  Abdominal: Soft. Bowel sounds are normal. There is no tenderness. There is no rebound.  Musculoskeletal: Normal range of motion.  Neurological: She is alert and oriented to person, place, and time.  Skin: Skin is warm and dry.  Psychiatric: She has a normal mood and affect.  Nursing note and vitals reviewed.    ED Treatments / Results  Labs (all labs ordered are listed, but only abnormal results are displayed) Labs Reviewed  GROUP A STREP BY PCR    EKG None  Radiology Dg Chest 2 View  Result Date: 08/29/2017 CLINICAL DATA:   Chest pain EXAM: CHEST - 2 VIEW COMPARISON:  12/10/2012 FINDINGS: The heart size and mediastinal contours are within normal limits. Both lungs are clear. The visualized skeletal structures are unremarkable. IMPRESSION: No active cardiopulmonary disease. Electronically Signed   By: Deatra RobinsonKevin  Herman M.D.   On: 08/29/2017 00:50    Procedures Procedures (including critical care time)  Medications Ordered in ED Medications  amoxicillin (AMOXIL) capsule 500 mg (500 mg Oral Given 08/29/17 0443)     Initial Impression / Assessment and Plan / ED Course  I have reviewed the triage vital signs and the nursing notes.  Pertinent labs & imaging results that were available during my care of the patient were reviewed by me and considered in my medical decision making (see chart for details).  22 y.o. F here with URI symptoms for the past week.  Here she is afebrile and nontoxic.  Does have lots of nasal congestion as well as tonsillar edema but no exudates.  She is handling secretions well.  Normal phonation without stridor.  Lungs are clear without wheezes or rhonchi.  Rapid strep sent from triage and is negative.  Chest x-ray also obtained, also negative.  Patient does have large amount of tonsillar edema but airway remains patent.  Will treat with amoxicillin as well as Flonase for congestion.  She is planning to go out of town this weekend to [place] to visit family, feel she is fine to do so.  Will have follow-up closely with her primary care doctor.  She understands to return here for any new/worsneing symptoms.  Final Clinical Impressions(s) / ED Diagnoses   Final diagnoses:  Sore throat  Cough  Nasal congestion    ED Discharge Orders        Ordered    amoxicillin (AMOXIL) 500 MG capsule  3 times daily     08/29/17 0448    fluticasone (FLONASE) 50 MCG/ACT nasal spray  Daily     08/29/17 0448       Garlon HatchetSanders, Ardis Fullwood M, PA-C 08/29/17 [id number]    Geoffery Lyonselo, Douglas, MD 08/29/17 [phone]

## 2017-08-29 NOTE — Discharge Instructions (Signed)
Take the prescribed medication as directed. °Follow-up with your primary care doctor. °Return to the ED for new or worsening symptoms. °

## 2017-10-03 ENCOUNTER — Encounter (HOSPITAL_COMMUNITY): Payer: Self-pay | Admitting: Emergency Medicine

## 2017-10-03 ENCOUNTER — Ambulatory Visit (HOSPITAL_COMMUNITY)
Admission: EM | Admit: 2017-10-03 | Discharge: 2017-10-03 | Disposition: A | Discharge status: Home / Self Care | Payer: Medicaid Other | Attending: Family Medicine | Admitting: Family Medicine

## 2017-10-03 DIAGNOSIS — R3915 Urgency of urination: Secondary | ICD-10-CM | POA: Diagnosis not present

## 2017-10-03 DIAGNOSIS — R350 Frequency of micturition: Secondary | ICD-10-CM | POA: Diagnosis not present

## 2017-10-03 DIAGNOSIS — Z3202 Encounter for pregnancy test, result negative: Secondary | ICD-10-CM

## 2017-10-03 DIAGNOSIS — R300 Dysuria: Secondary | ICD-10-CM | POA: Insufficient documentation

## 2017-10-03 DIAGNOSIS — Z79899 Other long term (current) drug therapy: Secondary | ICD-10-CM | POA: Diagnosis not present

## 2017-10-03 DIAGNOSIS — M545 Low back pain: Secondary | ICD-10-CM | POA: Insufficient documentation

## 2017-10-03 DIAGNOSIS — Z9889 Other specified postprocedural states: Secondary | ICD-10-CM | POA: Insufficient documentation

## 2017-10-03 LAB — POCT URINALYSIS DIP (DEVICE)
GLUCOSE, UA: NEGATIVE mg/dL
Ketones, ur: NEGATIVE mg/dL
LEUKOCYTES UA: NEGATIVE
NITRITE: NEGATIVE
Protein, ur: 30 mg/dL — AB
Specific Gravity, Urine: >=1.03 (ref 1.005–1.030)
UROBILINOGEN UA: 0.2 mg/dL (ref 0.0–1.0)
pH: 6 (ref 5.0–8.0)

## 2017-10-03 LAB — POCT PREGNANCY, URINE: PREG TEST UR: NEGATIVE

## 2017-10-03 MED ORDER — PHENAZOPYRIDINE HCL 200 MG PO TABS: 200.0000 mg | ORAL_TABLET | Freq: Three times a day (TID) | ORAL | 0 refills | Status: DC

## 2017-10-03 NOTE — ED Provider Notes (Signed)
[facility]    CSN: [id number] Arrival date & time: 10/03/17  1237     History   Chief Complaint Chief Complaint  Patient presents with  . Vaginal Burning    HPI Loyal JacobsonYvette Loree is a 22 y.o. female.   Bronson IngYvette presents with complaints of irritation and burning with urination, stinging as well as urgency and frequency of urination. Started approximately 4 days ago. With some pelvic discomfort and low back pain. No fevers. Without any vaginal discharge or itching. LMP 7/4 but was only for 2 days. States did note some mucus output at time of her period. Denies any previous similar. No nausea or vomiting. Without contributing medical history.    Significant other provided occasional spanish interpretation as needed for clarification.   ROS per HPI.      Past Medical History:  Diagnosis Date  . Gallstones   . Medical history non-contributory     Patient Active Problem List   Diagnosis Date Noted  . S/P cesarean section for arrest of descent 01/13/2016  . Maternal varicella, non-immune 01/12/2016  . Supervision of normal first pregnancy in third trimester 07/04/2015    Past Surgical History:  Procedure Laterality Date  . CESAREAN SECTION N/A 01/14/2016   Procedure: CESAREAN SECTION;  Surgeon: Tereso NewcomerUgonna A Anyanwu, MD;  Location: [facility];  Service: Obstetrics;  Laterality: N/A;  . NO PAST SURGERIES      OB History    Gravida  1   Para  1   Term  1   Preterm      AB      Living  1     SAB      TAB      Ectopic      Multiple  0   Live Births  1            Home Medications    Prior to Admission medications   Medication Sig Start Date End Date Taking? Authorizing Provider  amoxicillin (AMOXIL) 500 MG capsule Take 1 capsule (500 mg total) by mouth 3 (three) times daily. Patient not taking: Reported on 10/03/2017 08/29/17   Garlon HatchetSanders, Lisa M, PA-C  fluticasone [facility](FLONASE) 50 MCG/ACT nasal spray Place 1 spray into both nostrils  daily. Patient not taking: Reported on 10/03/2017 08/29/17   Garlon HatchetSanders, Lisa M, PA-C  phenazopyridine (PYRIDIUM) 200 MG tablet Take 1 tablet (200 mg total) by mouth 3 (three) times daily. 10/03/17   Georgetta HaberBurky, Natalie B, NP  promethazine-phenylephrine (PROMETHAZINE VC) 6.25-5 MG/5ML SYRP Take 5 mLs by mouth every 8 (eight) hours as needed for congestion. Patient not taking: Reported on 10/03/2017 10/27/16   Janne NapoleonNeese, Hope M, NP    Family History No family history on file.  Social History Social History   Tobacco Use  . Smoking status: Never Smoker  . Smokeless tobacco: Never Used  Substance Use Topics  . Alcohol use: No    Alcohol/week: 0.0 oz  . Drug use: No     Allergies   Patient has no known allergies.   Review of Systems Review of Systems   Physical Exam Triage Vital Signs ED Triage Vitals  Enc Vitals Group     BP 10/03/17 1320 (!) 138/98     Pulse Rate 10/03/17 1318 75     Resp 10/03/17 1318 16     Temp 10/03/17 1318 97.8 F (36.6 C)     Temp src --      SpO2 10/03/17 1318 97 %  Weight --      Height --      Head Circumference --      Peak Flow --      Pain Score --      Pain Loc --      Pain Edu? --      Excl. in GC? --    No data found.  Updated Vital Signs BP (!) 138/98   Pulse 75   Temp 97.8 F (36.6 C)   Resp 16   LMP 09/26/2017   SpO2 97%    Physical Exam  Constitutional: She is oriented to person, place, and time. She appears well-developed and well-nourished. No distress.  Cardiovascular: Normal rate, regular rhythm and normal heart sounds.  Pulmonary/Chest: Effort normal and breath sounds normal.  Abdominal: Soft. There is no tenderness. There is no rigidity, no rebound, no guarding and no CVA tenderness.  Genitourinary: There is no rash, tenderness or lesion on the right labia. There is no rash, tenderness or lesion on the left labia.  Genitourinary Comments: External exam WNl without noticeable discharge or lesions  Neurological: She is alert  and oriented to person, place, and time.  Skin: Skin is warm and dry.     UC Treatments / Results  Labs (all labs ordered are listed, but only abnormal results are displayed) Labs Reviewed  POCT URINALYSIS DIP (DEVICE) - Abnormal; Notable for the following components:      Result Value   Bilirubin Urine SMALL (*)    Hgb urine dipstick TRACE (*)    Protein, ur 30 (*)    All other components within normal limits  URINE CULTURE  POCT PREGNANCY, URINE  CERVICOVAGINAL ANCILLARY ONLY    EKG None  Radiology No results found.  Procedures Procedures (including critical care time)  Medications Ordered in UC Medications - No data to display  Initial Impression / Assessment and Plan / UC Course  I have reviewed the triage vital signs and the nursing notes.  Pertinent labs & imaging results that were available during my care of the patient were reviewed by me and considered in my medical decision making (see chart for details).    Normal vulvar exam. ua without indications of infection. No vaginal discharge, symptoms have been primarily urinary. Vaginal cytology pending, urine culture pending. Pyridium provided at this time. Push fluid intake. Return precautions provided. If symptoms worsen or do not improve in the next week to return to be seen or to follow up with PCP.  Patient verbalized understanding and agreeable to plan.     Final Clinical Impressions(s) / UC Diagnoses   Final diagnoses:  Dysuria     Discharge Instructions     Your urine does not indicate infection at this time, I have sent it to be cultured to see if there are any bacteria present. We have sent your vaginal swab for testing as well to test for vaginal infections. Will notify of any positive findings and if any changes to treatment are needed.   May try the pyridium I have sent to see if this is helpful with symptoms. May turn your urine orange which can stain undergarments.  If symptoms worsen or do  not improve in the next week to return to be seen or to follow up with your PCP.      ED Prescriptions    Medication Sig Dispense Auth. Provider   phenazopyridine (PYRIDIUM) 200 MG tablet Take 1 tablet (200 mg total) by mouth 3 (three) times  daily. 6 tablet Georgetta Haber, NP     Controlled Substance Prescriptions [place] Controlled Substance Registry consulted? Not Applicable   Georgetta Haber, NP 10/03/17 1414

## 2017-10-03 NOTE — ED Triage Notes (Signed)
Pt c/o vaginal burning x4 days.

## 2017-10-03 NOTE — Discharge Instructions (Signed)
Your urine does not indicate infection at this time, I have sent it to be cultured to see if there are any bacteria present. We have sent your vaginal swab for testing as well to test for vaginal infections. Will notify of any positive findings and if any changes to treatment are needed.   May try the pyridium I have sent to see if this is helpful with symptoms. May turn your urine orange which can stain undergarments.  If symptoms worsen or do not improve in the next week to return to be seen or to follow up with your PCP.

## 2017-10-04 LAB — CERVICOVAGINAL ANCILLARY ONLY
Bacterial vaginitis: NEGATIVE
CANDIDA VAGINITIS: NEGATIVE
CHLAMYDIA, DNA PROBE: NEGATIVE
Neisseria Gonorrhea: NEGATIVE
TRICH (WINDOWPATH): NEGATIVE

## 2017-10-05 LAB — URINE CULTURE: Culture: 90000 — AB

## 2017-10-06 ENCOUNTER — Encounter (HOSPITAL_COMMUNITY): Payer: Self-pay | Admitting: Emergency Medicine

## 2017-10-06 ENCOUNTER — Emergency Department (HOSPITAL_COMMUNITY)
Admission: EM | Admit: 2017-10-06 | Discharge: 2017-10-07 | Disposition: A | Discharge status: Home / Self Care | Payer: Medicaid Other | Attending: Emergency Medicine | Admitting: Emergency Medicine

## 2017-10-06 ENCOUNTER — Other Ambulatory Visit: Payer: Self-pay

## 2017-10-06 DIAGNOSIS — M545 Low back pain, unspecified: Secondary | ICD-10-CM

## 2017-10-06 DIAGNOSIS — R300 Dysuria: Secondary | ICD-10-CM | POA: Diagnosis not present

## 2017-10-06 DIAGNOSIS — R102 Pelvic and perineal pain: Secondary | ICD-10-CM | POA: Insufficient documentation

## 2017-10-06 LAB — URINALYSIS, ROUTINE W REFLEX MICROSCOPIC
BILIRUBIN URINE: NEGATIVE
GLUCOSE, UA: NEGATIVE mg/dL
HGB URINE DIPSTICK: NEGATIVE
KETONES UR: NEGATIVE mg/dL
Leukocytes, UA: NEGATIVE
Nitrite: NEGATIVE
PROTEIN: NEGATIVE mg/dL
Specific Gravity, Urine: 1.006 (ref 1.005–1.030)
pH: 6 (ref 5.0–8.0)

## 2017-10-06 LAB — CBC
HCT: 43.7 % (ref 36.0–46.0)
Hemoglobin: 13.3 g/dL (ref 12.0–15.0)
MCH: 27.8 pg (ref 26.0–34.0)
MCHC: 30.4 g/dL (ref 30.0–36.0)
MCV: 91.4 fL (ref 78.0–100.0)
PLATELETS: 381 10*3/uL (ref 150–400)
RBC: 4.78 MIL/uL (ref 3.87–5.11)
RDW: 13.1 % (ref 11.5–15.5)
WBC: 9.6 10*3/uL (ref 4.0–10.5)

## 2017-10-06 LAB — BASIC METABOLIC PANEL
Anion gap: 9 (ref 5–15)
BUN: 11 mg/dL (ref 6–20)
CHLORIDE: 106 mmol/L (ref 98–111)
CO2: 23 mmol/L (ref 22–32)
CREATININE: 0.57 mg/dL (ref 0.44–1.00)
Calcium: 8.9 mg/dL (ref 8.9–10.3)
GFR calc non Af Amer: >60 mL/min (ref >60)
GLUCOSE: 103 mg/dL — AB (ref 70–99)
Potassium: 4.2 mmol/L (ref 3.5–5.1)
Sodium: 138 mmol/L (ref 135–145)

## 2017-10-06 LAB — I-STAT BETA HCG BLOOD, ED (MC, WL, AP ONLY): I-stat hCG, quantitative: <5 m[IU]/mL (ref <5)

## 2017-10-06 NOTE — ED Triage Notes (Signed)
Pt presents with abd pain and back pain x 2 wks without improvement; pt states she was [facility] at [facility] for same thing a wk ago, still needs results; also stating she is having burning with urination

## 2017-10-06 NOTE — ED Notes (Signed)
Called x1, pt in bathroom

## 2017-10-07 ENCOUNTER — Telehealth (HOSPITAL_COMMUNITY): Payer: Self-pay

## 2017-10-07 MED ORDER — KETOROLAC TROMETHAMINE 60 MG/2ML IM SOLN
30.0000 mg | Freq: Once | INTRAMUSCULAR | Status: AC
  Administered 2017-10-07: 30 mg via INTRAMUSCULAR
  Filled 2017-10-07: qty 2

## 2017-10-07 MED ORDER — NITROFURANTOIN MONOHYD MACRO 100 MG PO CAPS: 100.0000 mg | ORAL_CAPSULE | Freq: Two times a day (BID) | ORAL | 0 refills | Status: DC

## 2017-10-07 NOTE — Telephone Encounter (Signed)
Urine culture positive for Klebsiella Pneumoniae. This was not treated at urgent care visit. rx for Macrobid 100 mg BID x 5 days sent to pharmacy of record. Attempted to reach patient.

## 2017-10-07 NOTE — Discharge Instructions (Addendum)
Puede tomar Motrin (Ibuprofen) o Aleve (Naproxen), Acetaminophen (Tylenol), crema para los musculos como SalonPas, Icy Hot, Bengay, etc. Puede estrechar, ponerce hielo o comprecion de calor, o que le den masaje. ° °

## 2017-10-07 NOTE — ED Provider Notes (Signed)
[facility]  CSN: [id number] Arrival date & time: 10/06/17 2137  Chief Complaint(s) Back Pain; Abdominal Pain; and Groin Burn  HPI Lisa JacobsonYvette Snipe is a 22 y.o. female who presents to the emergency department with 2 weeks of persistent left lower back and pelvic discomfort as well as dysuria.  Patient reports that the pain has been unchanged since onset.  There is no alleviating or aggravating factors of the back pain but the patient reports her dysuria improves after drinking lots of water.  Denies any fevers or chills.  Denies any vaginal discharge.  Last menstrual period was July 4.  No nausea vomiting.  No diarrhea.  No bladder/bowel incontinence.  No lower extremity weakness or loss of sensation.  Patient was seen by urgent care on July 11 and had negative work-up including urinalysis.  She was given Pyridium which she said did not help.  HPI  Past Medical History Past Medical History:  Diagnosis Date  . Gallstones   . Medical history non-contributory    Patient Active Problem List   Diagnosis Date Noted  . S/P cesarean section for arrest of descent 01/13/2016  . Maternal varicella, non-immune 01/12/2016  . Supervision of normal first pregnancy in third trimester 07/04/2015   Home Medication(s) Prior to Admission medications   Medication Sig Start Date End Date Taking? Authorizing Provider  phenazopyridine (PYRIDIUM) 200 MG tablet Take 1 tablet (200 mg total) by mouth 3 (three) times daily. 10/03/17  Yes Burky, Dorene GrebeNatalie B, NP  amoxicillin (AMOXIL) 500 MG capsule Take 1 capsule (500 mg total) by mouth 3 (three) times daily. Patient not taking: Reported on 10/03/2017 08/29/17   Garlon HatchetSanders, Lisa M, PA-C  fluticasone [facility](FLONASE) 50 MCG/ACT nasal spray Place 1 spray into both nostrils daily. Patient not taking: Reported on 10/03/2017 08/29/17   Garlon HatchetSanders, Lisa M, PA-C  promethazine-phenylephrine (PROMETHAZINE VC) 6.25-5 MG/5ML SYRP Take 5 mLs by  mouth every 8 (eight) hours as needed for congestion. Patient not taking: Reported on 10/03/2017 10/27/16   Janne NapoleonNeese, Hope M, NP                                                                                                                                    Past Surgical History Past Surgical History:  Procedure Laterality Date  . CESAREAN SECTION N/A 01/14/2016   Procedure: CESAREAN SECTION;  Surgeon: Tereso NewcomerUgonna A Anyanwu, MD;  Location: [facility];  Service: Obstetrics;  Laterality: N/A;  . NO PAST SURGERIES     Family History History reviewed. No pertinent family history.  Social History Social History   Tobacco Use  . Smoking status: Never Smoker  . Smokeless tobacco: Never Used  Substance Use Topics  . Alcohol use: No    Alcohol/week: 0.0 oz  . Drug use: No   Allergies Patient has no known allergies.  Review of Systems Review of Systems All other systems are reviewed  and are negative for acute change except as noted in the HPI  Physical Exam Vital Signs  I have reviewed the triage vital signs BP (!) 104/59   Pulse 88   Temp 98.9 F (37.2 C) (Oral)   Resp 12   LMP 09/26/2017   SpO2 99%   Physical Exam  Constitutional: She is oriented to person, place, and time. She appears well-developed and well-nourished. No distress.  HENT:  Head: Normocephalic and atraumatic.  Right Ear: External ear normal.  Left Ear: External ear normal.  Nose: Nose normal.  Eyes: Conjunctivae and EOM are normal. No scleral icterus.  Neck: Normal range of motion and phonation normal.  Cardiovascular: Normal rate and regular rhythm.  Pulmonary/Chest: Effort normal. No stridor. No respiratory distress.  Abdominal: She exhibits no distension.  Genitourinary: There is no lesion on the right labia. There is no lesion on the left labia.  Genitourinary Comments: Chaperone present during pelvic exam.   Musculoskeletal: Normal range of motion. She exhibits no edema.       Lumbar back: She  exhibits tenderness and spasm.       Back:  Neurological: She is alert and oriented to person, place, and time.  Skin: She is not diaphoretic.  Psychiatric: She has a normal mood and affect. Her behavior is normal.  Vitals reviewed.   ED Results and Treatments Labs (all labs ordered are listed, but only abnormal results are displayed) Labs Reviewed  URINALYSIS, ROUTINE W REFLEX MICROSCOPIC - Abnormal; Notable for the following components:      Result Value   Color, Urine STRAW (*)    All other components within normal limits  BASIC METABOLIC PANEL - Abnormal; Notable for the following components:   Glucose, Bld 103 (*)    All other components within normal limits  CBC  I-STAT BETA HCG BLOOD, ED ([facility], [facility], [facility] ONLY)                                                                                                                         EKG  EKG Interpretation  Date/Time:    Ventricular Rate:    PR Interval:    QRS Duration:   QT Interval:    QTC Calculation:   R Axis:     Text Interpretation:        Radiology No results found. Pertinent labs & imaging results that were available during my care of the patient were reviewed by me and considered in my medical decision making (see chart for details).  Medications Ordered in ED Medications  ketorolac (TORADOL) injection 30 mg (30 mg Intramuscular Given 10/07/17 0236)  Procedures Procedures  (including critical care time)  Medical Decision Making / ED Course I have reviewed the nursing notes for this encounter and the patient's prior records (if available in EHR or on provided paperwork).    22 y.o. female presents with back pain in lumbar area for 2 weeks without signs of radicular pain. No acute traumatic onset. No red flag symptoms of fever, weight loss, saddle anesthesia, weakness,  fecal/urinary incontinence or urinary retention.   Suspect MSK etiology. No indication for imaging emergently. Patient was recommended to take short course of scheduled NSAIDs and engage in early mobility as definitive treatment. Return precautions discussed for worsening or new concerning symptoms.    Patient also with dysuria but UA without evidence of urinary tract infection or hematuria.  No pelvic discharge.  On pelvic exam no lesions concerning for herpes.  The patient appears reasonably screened and/or stabilized for discharge and I doubt any other medical condition or other [facility] requiring further screening, evaluation, or treatment in the ED at this time prior to discharge.  The patient is safe for discharge with strict return precautions.   Final Clinical Impression(s) / ED Diagnoses Final diagnoses:  Acute left-sided low back pain without sciatica  Dysuria   Disposition: Discharge  Condition: Good  I have discussed the results, Dx and Tx plan with the patient who expressed understanding and agree(s) with the plan. Discharge instructions discussed at great length. The patient was given strict return precautions who verbalized understanding of the instructions. No further questions at time of discharge.    ED Discharge Orders    None       Follow Up: [facility] [address] [place] [address] [phone]  Schedule an appointment as soon as possible for a visit    [facility] [address] [place] [address] [phone]        This chart was dictated using voice recognition software.  Despite best efforts to proofread,  errors can occur which can change the documentation meaning.   Nira Conn, MD 10/07/17 [phone]

## 2017-10-07 NOTE — ED Notes (Signed)
ED Provider at bedside. 

## 2017-10-08 ENCOUNTER — Telehealth (HOSPITAL_COMMUNITY): Payer: Self-pay

## 2017-10-08 NOTE — Telephone Encounter (Signed)
Attempted to reach patient x 2 

## 2017-10-10 ENCOUNTER — Telehealth (HOSPITAL_COMMUNITY): Payer: Self-pay

## 2017-10-10 NOTE — Telephone Encounter (Signed)
Pt called this RN back and is aware of results and new RX

## 2017-10-17 ENCOUNTER — Other Ambulatory Visit: Payer: Self-pay | Admitting: Physician Assistant

## 2017-10-17 ENCOUNTER — Ambulatory Visit
Admission: RE | Admit: 2017-10-17 | Discharge: 2017-10-17 | Disposition: A | Discharge status: Home / Self Care | Payer: Medicaid Other | Source: Ambulatory Visit | Attending: Physician Assistant | Admitting: Physician Assistant

## 2017-10-17 DIAGNOSIS — R109 Unspecified abdominal pain: Secondary | ICD-10-CM

## 2017-11-20 ENCOUNTER — Other Ambulatory Visit: Payer: Self-pay | Admitting: Family Medicine

## 2017-11-20 ENCOUNTER — Ambulatory Visit
Admission: RE | Admit: 2017-11-20 | Discharge: 2017-11-20 | Disposition: A | Discharge status: Home / Self Care | Payer: Medicaid Other | Source: Ambulatory Visit | Attending: Family Medicine | Admitting: Family Medicine

## 2017-11-20 DIAGNOSIS — R52 Pain, unspecified: Secondary | ICD-10-CM

## 2018-02-24 ENCOUNTER — Emergency Department (HOSPITAL_COMMUNITY): Payer: Medicaid Other

## 2018-02-24 ENCOUNTER — Encounter (HOSPITAL_COMMUNITY): Payer: Self-pay | Admitting: *Deleted

## 2018-02-24 ENCOUNTER — Other Ambulatory Visit: Payer: Self-pay

## 2018-02-24 ENCOUNTER — Emergency Department (HOSPITAL_COMMUNITY)
Admission: EM | Admit: 2018-02-24 | Discharge: 2018-02-24 | Disposition: A | Discharge status: Home / Self Care | Payer: Medicaid Other | Attending: Emergency Medicine | Admitting: Emergency Medicine

## 2018-02-24 DIAGNOSIS — H538 Other visual disturbances: Secondary | ICD-10-CM | POA: Diagnosis not present

## 2018-02-24 DIAGNOSIS — R42 Dizziness and giddiness: Secondary | ICD-10-CM | POA: Insufficient documentation

## 2018-02-24 DIAGNOSIS — H53149 Visual discomfort, unspecified: Secondary | ICD-10-CM | POA: Diagnosis not present

## 2018-02-24 DIAGNOSIS — R519 Headache, unspecified: Secondary | ICD-10-CM

## 2018-02-24 DIAGNOSIS — R51 Headache: Secondary | ICD-10-CM | POA: Insufficient documentation

## 2018-02-24 HISTORY — DX: Migraine, unspecified, not intractable, without status migrainosus: G43.909

## 2018-02-24 LAB — CBC WITH DIFFERENTIAL/PLATELET
ABS IMMATURE GRANULOCYTES: 0.03 10*3/uL (ref 0.00–0.07)
BASOS PCT: 0 %
Basophils Absolute: 0 10*3/uL (ref 0.0–0.1)
EOS ABS: 0.1 10*3/uL (ref 0.0–0.5)
Eosinophils Relative: 1 %
HCT: 44.7 % (ref 36.0–46.0)
Hemoglobin: 13.6 g/dL (ref 12.0–15.0)
IMMATURE GRANULOCYTES: 0 %
Lymphocytes Relative: 41 %
Lymphs Abs: 4.2 10*3/uL — ABNORMAL HIGH (ref 0.7–4.0)
MCH: 27.6 pg (ref 26.0–34.0)
MCHC: 30.4 g/dL (ref 30.0–36.0)
MCV: 90.7 fL (ref 80.0–100.0)
Monocytes Absolute: 0.8 10*3/uL (ref 0.1–1.0)
Monocytes Relative: 7 %
NEUTROS ABS: 5 10*3/uL (ref 1.7–7.7)
NEUTROS PCT: 51 %
NRBC: 0 % (ref 0.0–0.2)
PLATELETS: 307 10*3/uL (ref 150–400)
RBC: 4.93 MIL/uL (ref 3.87–5.11)
RDW: 13 % (ref 11.5–15.5)
WBC: 10.2 10*3/uL (ref 4.0–10.5)

## 2018-02-24 LAB — BASIC METABOLIC PANEL
ANION GAP: 9 (ref 5–15)
BUN: 9 mg/dL (ref 6–20)
CALCIUM: 8.9 mg/dL (ref 8.9–10.3)
CO2: 22 mmol/L (ref 22–32)
Chloride: 104 mmol/L (ref 98–111)
Creatinine, Ser: 0.7 mg/dL (ref 0.44–1.00)
GFR calc Af Amer: >60 mL/min (ref >60)
Glucose, Bld: 117 mg/dL — ABNORMAL HIGH (ref 70–99)
Potassium: 4.1 mmol/L (ref 3.5–5.1)
SODIUM: 135 mmol/L (ref 135–145)

## 2018-02-24 LAB — I-STAT BETA HCG BLOOD, ED (MC, WL, AP ONLY)

## 2018-02-24 MED ORDER — METOCLOPRAMIDE HCL 5 MG/ML IJ SOLN
10.0000 mg | Freq: Once | INTRAMUSCULAR | Status: AC
  Administered 2018-02-24: 10 mg via INTRAVENOUS
  Filled 2018-02-24: qty 2

## 2018-02-24 MED ORDER — DEXAMETHASONE SODIUM PHOSPHATE 4 MG/ML IJ SOLN
4.0000 mg | Freq: Once | INTRAMUSCULAR | Status: AC
  Administered 2018-02-24: 4 mg via INTRAVENOUS
  Filled 2018-02-24: qty 1

## 2018-02-24 MED ORDER — MECLIZINE HCL 25 MG PO TABS
25.0000 mg | ORAL_TABLET | Freq: Once | ORAL | Status: AC
  Administered 2018-02-24: 25 mg via ORAL
  Filled 2018-02-24: qty 1

## 2018-02-24 MED ORDER — SODIUM CHLORIDE 0.9 % IV BOLUS
1000.0000 mL | Freq: Once | INTRAVENOUS | Status: AC
  Administered 2018-02-24: 1000 mL via INTRAVENOUS

## 2018-02-24 NOTE — Discharge Instructions (Addendum)
Please follow-up with your primary care doctor.  I have given you follow-up with neurology, please call them for an appointment.    Today you received medications that may make you sleepy or impair your ability to make decisions.  For the next 24 hours please do not drive, operate heavy machinery, care for a small child with out another adult present, or perform any activities that may cause harm to you or someone else if you were to fall asleep or be impaired.   Today you were given steroids.  This can cause you to have mood swings or increased appetite for the next few days.    Your blood work did not show a cause for your headache.

## 2018-02-24 NOTE — ED Notes (Signed)
Reviewed d/c instructions with pt, who verbalized understanding and had no outstanding questions. Pt departed in NAD, refused use of wheelchair.   

## 2018-02-24 NOTE — ED Triage Notes (Signed)
To ED for eval of feeling dizzy for a few days. HA started a few days - slight. HA became worse this am. Photophobia 'a little'. No vomiting.

## 2018-02-24 NOTE — ED Notes (Signed)
Patient transported to CT 

## 2018-02-24 NOTE — ED Provider Notes (Signed)
[facility] EMERGENCY DEPARTMENT Provider Note   CSN: [id number] Arrival date & time: 02/24/18  0254     History   Chief Complaint Chief Complaint  Patient presents with  . Migraine    HPI Lisa Herrera is a 22 y.o. female with a past medical history of gallstones, who presents today for evaluation of right-sided headache with left-sided eye blurry vision.  She reports that this started approximately 2 hours prior to arrival and woke her up.  She does not have a history of migraines.  She reports feeling dizzy like things are moving.  She denies Nausea or vomiting.  She reports photophobia and mild phonophobia.  She denies any trauma.    History is obtained by patient and her husband, who patient gave permission to translate as needed.    HPI  Past Medical History:  Diagnosis Date  . Gallstones   . Medical history non-contributory   . Migraine     Patient Active Problem List   Diagnosis Date Noted  . S/P cesarean section for arrest of descent 01/13/2016  . Maternal varicella, non-immune 01/12/2016  . Supervision of normal first pregnancy in third trimester 07/04/2015    Past Surgical History:  Procedure Laterality Date  . CESAREAN SECTION N/A 01/14/2016   Procedure: CESAREAN SECTION;  Surgeon: Tereso Newcomer, MD;  Location: [facility];  Service: Obstetrics;  Laterality: N/A;  . NO PAST SURGERIES       OB History    Gravida  1   Para  1   Term  1   Preterm      AB      Living  1     SAB      TAB      Ectopic      Multiple  0   Live Births  1            Home Medications    Prior to Admission medications   Medication Sig Start Date End Date Taking? Authorizing Provider  amoxicillin (AMOXIL) 500 MG capsule Take 1 capsule (500 mg total) by mouth 3 (three) times daily. Patient not taking: Reported on 10/03/2017 08/29/17   Garlon Hatchet, PA-C  fluticasone [facility]) 50 MCG/ACT nasal spray Place 1 spray into both  nostrils daily. Patient not taking: Reported on 10/03/2017 08/29/17   Garlon Hatchet, PA-C  nitrofurantoin, macrocrystal-monohydrate, (MACROBID) 100 MG capsule Take 1 capsule (100 mg total) by mouth 2 (two) times daily. Patient not taking: Reported on 02/24/2018 10/07/17   Isa Rankin, MD  phenazopyridine (PYRIDIUM) 200 MG tablet Take 1 tablet (200 mg total) by mouth 3 (three) times daily. Patient not taking: Reported on 02/24/2018 10/03/17   Georgetta Haber, NP  promethazine-phenylephrine (PROMETHAZINE VC) 6.25-5 MG/5ML SYRP Take 5 mLs by mouth every 8 (eight) hours as needed for congestion. Patient not taking: Reported on 10/03/2017 10/27/16   Janne Napoleon, NP    Family History No family history on file.  Social History Social History   Tobacco Use  . Smoking status: Never Smoker  . Smokeless tobacco: Never Used  Substance Use Topics  . Alcohol use: No    Alcohol/week: 0.0 standard drinks  . Drug use: No     Allergies   Patient has no known allergies.   Review of Systems Review of Systems  Constitutional: Negative for chills and fever.  HENT: Negative for congestion and rhinorrhea.   Eyes: Positive for photophobia and visual disturbance.  Respiratory: Negative for shortness of breath.   Cardiovascular: Negative for chest pain.  Gastrointestinal: Negative for abdominal pain, nausea and vomiting.  Musculoskeletal: Negative for neck pain and neck stiffness.  Neurological: Positive for dizziness and headaches. Negative for facial asymmetry, speech difficulty, weakness, light-headedness and numbness.  Psychiatric/Behavioral: Negative for confusion. The patient is not nervous/anxious.   All other systems reviewed and are negative.    Physical Exam Updated Vital Signs BP (!) 111/53 (BP Location: Left Arm)   Pulse (!) 58   Temp 98 F (36.7 C) (Oral)   Resp 14   SpO2 98%   Physical Exam  Constitutional: She is oriented to person, place, and time. She appears  well-developed and well-nourished. No distress.  HENT:  Head: Normocephalic and atraumatic.  Mouth/Throat: Oropharynx is clear and moist.  No raccoon's eyes or battle signs.  TMs are pearly gray bilaterally without hemotympanum.   Eyes: Pupils are equal, round, and reactive to light. Conjunctivae and EOM are normal.  Neck: Normal range of motion. Neck supple.  Cardiovascular: Normal rate, regular rhythm, normal heart sounds and intact distal pulses.  No murmur heard. Pulmonary/Chest: Effort normal and breath sounds normal. No respiratory distress.  Abdominal: Soft. Bowel sounds are normal. She exhibits no distension. There is no tenderness.  Musculoskeletal: She exhibits no edema.  Lymphadenopathy:    She has no cervical adenopathy.  Neurological: She is alert and oriented to person, place, and time.  Mental Status:  Alert, oriented, thought content appropriate, able to give a coherent history. Speech fluent without evidence of aphasia. Able to follow 2 step commands without difficulty.  Cranial Nerves:  II:  Peripheral visual fields grossly normal, pupils equal, round, reactive to light III,IV, VI: ptosis not present, extra-ocular motions intact bilaterally  V,VII: smile symmetric VIII: hearing grossly normal to voice  X: uvula elevates symmetrically  XI: bilateral shoulder shrug symmetric and strong XII: midline tongue extension without fassiculations Motor:  Normal tone. 5/5 in upper and lower extremities bilaterally including strong and equal grip strength and dorsiflexion/plantar flexion Cerebellar: normal finger-to-nose with bilateral upper extremities CV: distal pulses palpable throughout    Skin: Skin is warm and dry. She is not diaphoretic.  Psychiatric: She has a normal mood and affect.  Nursing note and vitals reviewed.    ED Treatments / Results  Labs (all labs ordered are listed, but only abnormal results are displayed) Labs Reviewed  CBC WITH  DIFFERENTIAL/PLATELET - Abnormal; Notable for the following components:      Result Value   Lymphs Abs 4.2 (*)    All other components within normal limits  BASIC METABOLIC PANEL - Abnormal; Notable for the following components:   Glucose, Bld 117 (*)    All other components within normal limits  I-STAT BETA HCG BLOOD, ED ([facility], [facility], [facility] ONLY)    EKG None  Radiology Ct Head Wo Contrast  Result Date: 02/24/2018 CLINICAL DATA:  Initial evaluation for acute headache. EXAM: CT HEAD WITHOUT CONTRAST TECHNIQUE: Contiguous axial images were obtained from the base of the skull through the vertex without intravenous contrast. COMPARISON:  None. FINDINGS: Brain: Cerebral volume within normal limits for patient age. No evidence for acute intracranial hemorrhage. No findings to suggest acute large vessel territory infarct. No mass lesion, midline shift, or mass effect. Ventricles are normal in size without evidence for hydrocephalus. No extra-axial fluid collection identified. Vascular: No hyperdense vessel identified. Skull: Scalp soft tissues demonstrate no acute abnormality. Calvarium intact. Sinuses/Orbits: Globes and orbital soft  tissues within normal limits. Visualized paranasal sinuses are clear. No mastoid effusion. IMPRESSION: Normal head CT.  No acute intracranial abnormality identified. Electronically Signed   By: Rise Mu M.D.   On: 02/24/2018 05:08    Procedures Procedures (including critical care time)  Medications Ordered in ED Medications  dexamethasone (DECADRON) injection 4 mg (has no administration in time range)  sodium chloride 0.9 % bolus 1,000 mL (1,000 mLs Intravenous New Bag/Given 02/24/18 0400)  metoCLOPramide (REGLAN) injection 10 mg (10 mg Intravenous Given 02/24/18 0400)  meclizine (ANTIVERT) tablet 25 mg (25 mg Oral Given 02/24/18 0349)     Initial Impression / Assessment and Plan / ED Course  I have reviewed the triage vital signs and the nursing  notes.  Pertinent labs & imaging results that were available during my care of the patient were reviewed by me and considered in my medical decision making (see chart for details).  Clinical Course as of Feb 24 614  Mon Feb 24, 2018  0605 Patient reevaluated, says that her headache is down to a 3 out of 10, it was a 10 out of 10 when she first came.  She is requesting discharge home.   [EH]    Clinical Course User Index [EH] Cristina Gong, PA-C   Arzu Mcgaughey presents today for evaluation of migraine headache for approximately 2 hours.  She does not have a history of migraine headaches.  Labs were obtained and reviewed, she is not pregnant, no significant hematologic or electrolyte abnormalities.  Based on first time headache CT head was obtained which did not show cause for her symptoms.  Neurologically she is intact with peripheral visual fields grossly intact, vision is similar bilaterally.  Given left eye symptoms in setting of right sided headache not consistent with an eye related cause, such as retinal detachment, retinal vascular occlusion, or other intraocular cause, rather suspect migraine.  She was treated with fluids, reglan, and antivert, after which she felt better and the flashes/visual changes she subjectively reported fully resolved.  She was given decadron to help prevent rebound migraine.  She does not have meningeal signs, neck is supple.  She is able to walk in the department without difficulties, and upon reevaluation was resting comfortably.  Her presentation, and improvement with migraine type medications in the setting of a normal CT head is not consistent with stroke.    Return precautions were discussed with patient who states their understanding.  At the time of discharge patient denied any unaddressed complaints or concerns.  Patient is agreeable for discharge home.   Final Clinical Impressions(s) / ED Diagnoses   Final diagnoses:  Bad headache    ED  Discharge Orders    None       Cristina Gong, PA-C 02/24/18 0615    Ward, Layla Maw, DO 02/24/18 [phone]

## 2018-05-23 ENCOUNTER — Other Ambulatory Visit: Payer: Self-pay

## 2018-05-23 ENCOUNTER — Emergency Department (HOSPITAL_COMMUNITY)
Admission: EM | Admit: 2018-05-23 | Discharge: 2018-05-24 | Disposition: A | Discharge status: Home / Self Care | Payer: Medicaid Other | Attending: Emergency Medicine | Admitting: Emergency Medicine

## 2018-05-23 ENCOUNTER — Encounter (HOSPITAL_COMMUNITY): Payer: Self-pay | Admitting: Emergency Medicine

## 2018-05-23 DIAGNOSIS — R600 Localized edema: Secondary | ICD-10-CM | POA: Diagnosis present

## 2018-05-23 DIAGNOSIS — N63 Unspecified lump in unspecified breast: Secondary | ICD-10-CM

## 2018-05-23 DIAGNOSIS — N6320 Unspecified lump in the left breast, unspecified quadrant: Secondary | ICD-10-CM | POA: Insufficient documentation

## 2018-05-23 NOTE — ED Triage Notes (Signed)
Pt reports lump to side of L breast x1 day. Pt reports some pain to area, denies any trauma. Pt denies redness or wound to the external area.

## 2018-05-23 NOTE — ED Provider Notes (Signed)
[facility] EMERGENCY DEPARTMENT Provider Note   CSN: [id number] Arrival date & time: 05/23/18  2135    History   Chief Complaint Chief Complaint  Patient presents with  . Breast Mass    HPI Lisa Herrera is a 24 y.o. female.     The history is provided by the patient and medical records.     23 y.o. F with hx of migraine headaches, presenting to the ED for left breast mass.  States today she was laying in bed and ran her arm across left breast and noticed a "lump".  She has never noticed this prior to today.  She denies redness or swelling of the breast.  No nipple discharge or bleeding.  Maternal aunt has history of breast cancer.  No current OB-GYN.  Past Medical History:  Diagnosis Date  . Gallstones   . Medical history non-contributory   . Migraine     Patient Active Problem List   Diagnosis Date Noted  . S/P cesarean section for arrest of descent 01/13/2016  . Maternal varicella, non-immune 01/12/2016  . Supervision of normal first pregnancy in third trimester 07/04/2015    Past Surgical History:  Procedure Laterality Date  . CESAREAN SECTION N/A 01/14/2016   Procedure: CESAREAN SECTION;  Surgeon: Tereso Newcomer, MD;  Location: [facility];  Service: Obstetrics;  Laterality: N/A;  . CHOLECYSTECTOMY    . NO PAST SURGERIES       OB History    Gravida  1   Para  1   Term  1   Preterm      AB      Living  1     SAB      TAB      Ectopic      Multiple  0   Live Births  1            Home Medications    Prior to Admission medications   Medication Sig Start Date End Date Taking? Authorizing Provider  amoxicillin (AMOXIL) 500 MG capsule Take 1 capsule (500 mg total) by mouth 3 (three) times daily. Patient not taking: Reported on 10/03/2017 08/29/17   Garlon Hatchet, PA-C  fluticasone [facility]) 50 MCG/ACT nasal spray Place 1 spray into both nostrils daily. Patient not taking: Reported on 10/03/2017 08/29/17    Garlon Hatchet, PA-C  nitrofurantoin, macrocrystal-monohydrate, (MACROBID) 100 MG capsule Take 1 capsule (100 mg total) by mouth 2 (two) times daily. Patient not taking: Reported on 02/24/2018 10/07/17   Isa Rankin, MD  phenazopyridine (PYRIDIUM) 200 MG tablet Take 1 tablet (200 mg total) by mouth 3 (three) times daily. Patient not taking: Reported on 02/24/2018 10/03/17   Georgetta Haber, NP  promethazine-phenylephrine (PROMETHAZINE VC) 6.25-5 MG/5ML SYRP Take 5 mLs by mouth every 8 (eight) hours as needed for congestion. Patient not taking: Reported on 10/03/2017 10/27/16   Janne Napoleon, NP    Family History No family history on file.  Social History Social History   Tobacco Use  . Smoking status: Never Smoker  . Smokeless tobacco: Never Used  Substance Use Topics  . Alcohol use: No    Alcohol/week: 0.0 standard drinks  . Drug use: No     Allergies   Patient has no known allergies.   Review of Systems Review of Systems  Genitourinary:       Breast mass  All other systems reviewed and are negative.    Physical Exam Updated Vital  Signs BP 136/67   Pulse 90   Temp 97.8 F (36.6 C) (Oral)   Resp 18   LMP 05/01/2018   SpO2 98%   Physical Exam Vitals signs and nursing note reviewed.  Constitutional:      Appearance: She is well-developed.  HENT:     Head: Normocephalic and atraumatic.  Eyes:     Conjunctiva/sclera: Conjunctivae normal.     Pupils: Pupils are equal, round, and reactive to light.  Neck:     Musculoskeletal: Normal range of motion.  Cardiovascular:     Rate and Rhythm: Normal rate and regular rhythm.     Heart sounds: Normal heart sounds.  Pulmonary:     Effort: Pulmonary effort is normal.     Breath sounds: Normal breath sounds.  Chest:     Comments: Left breast overall normal in appearance without overlying skin changes or rash; there is very small approx 1cm cyst like structure deep within left breast tissue; mild tenderness; no  nipple discharge or bleeding observed Abdominal:     General: Bowel sounds are normal.     Palpations: Abdomen is soft.  Musculoskeletal: Normal range of motion.  Skin:    General: Skin is warm and dry.  Neurological:     Mental Status: She is alert and oriented to person, place, and time.      ED Treatments / Results  Labs (all labs ordered are listed, but only abnormal results are displayed) Labs Reviewed - No data to display  EKG None  Radiology No results found.  Procedures Procedures (including critical care time)  Medications Ordered in ED Medications - No data to display   Initial Impression / Assessment and Plan / ED Course  I have reviewed the triage vital signs and the nursing notes.  Pertinent labs & imaging results that were available during my care of the patient were reviewed by me and considered in my medical decision making (see chart for details).  23 year old female here with left-sided breast pain and "knot" she found today lying in bed.  Mildly tender.  She is afebrile and nontoxic in appearance.  Left breast is overall normal.  Without any overlying skin changes or rash.  There is a 1 cm cystlike structure deep in the left breast tissue that is mildly tender.  There is no nipple discharge or bleeding.  Patient does have aunt with history of breast cancer, however otherwise no significant risk factors.  Given her young age, may represent fibroadenoma vs fibrocystic change.  She is due to start her menstrual cycle in approximately 8 days.  Recommended to have breast ultrasound, will refer back to her PCP to have this scheduled.  She can return here for any new/acute changes.  Final Clinical Impressions(s) / ED Diagnoses   Final diagnoses:  Breast mass    ED Discharge Orders    None       Garlon Hatchet, PA-C 05/24/18 0034    Zadie Rhine, MD 05/25/18 [phone]

## 2018-05-23 NOTE — ED Notes (Signed)
ED Provider at bedside. 

## 2018-05-24 NOTE — Discharge Instructions (Signed)
Follow-up with your primary care doctor, they can schedule you for outpatient ultrasound at the breast center. Return here for any new/acute changes.--- redness of breast, swelling, nipple discharge/bleeding, etc.

## 2018-05-27 ENCOUNTER — Other Ambulatory Visit (HOSPITAL_COMMUNITY)
Admission: RE | Admit: 2018-05-27 | Discharge: 2018-05-27 | Disposition: A | Discharge status: Home / Self Care | Payer: Medicaid Other | Source: Ambulatory Visit | Attending: Obstetrics and Gynecology | Admitting: Obstetrics and Gynecology

## 2018-05-27 ENCOUNTER — Other Ambulatory Visit: Payer: Self-pay | Admitting: Obstetrics and Gynecology

## 2018-05-27 DIAGNOSIS — Z01419 Encounter for gynecological examination (general) (routine) without abnormal findings: Secondary | ICD-10-CM | POA: Diagnosis not present

## 2018-05-27 DIAGNOSIS — N6002 Solitary cyst of left breast: Secondary | ICD-10-CM

## 2018-05-28 LAB — CYTOLOGY - PAP: Diagnosis: NEGATIVE

## 2018-05-30 ENCOUNTER — Ambulatory Visit
Admission: RE | Admit: 2018-05-30 | Discharge: 2018-05-30 | Disposition: A | Discharge status: Home / Self Care | Payer: Medicaid Other | Source: Ambulatory Visit | Attending: Obstetrics and Gynecology | Admitting: Obstetrics and Gynecology

## 2018-05-30 DIAGNOSIS — N6002 Solitary cyst of left breast: Secondary | ICD-10-CM

## 2019-03-09 ENCOUNTER — Other Ambulatory Visit (HOSPITAL_BASED_OUTPATIENT_CLINIC_OR_DEPARTMENT_OTHER): Payer: Self-pay

## 2019-03-09 DIAGNOSIS — R0683 Snoring: Secondary | ICD-10-CM

## 2019-03-09 DIAGNOSIS — G4710 Hypersomnia, unspecified: Secondary | ICD-10-CM

## 2019-03-09 DIAGNOSIS — G4730 Sleep apnea, unspecified: Secondary | ICD-10-CM

## 2019-03-21 ENCOUNTER — Other Ambulatory Visit (HOSPITAL_COMMUNITY)
Admission: RE | Admit: 2019-03-21 | Discharge: 2019-03-21 | Disposition: A | Discharge status: Home / Self Care | Payer: Medicaid Other | Source: Ambulatory Visit | Attending: Internal Medicine | Admitting: Internal Medicine

## 2019-03-21 DIAGNOSIS — Z01812 Encounter for preprocedural laboratory examination: Secondary | ICD-10-CM | POA: Insufficient documentation

## 2019-03-21 DIAGNOSIS — Z20828 Contact with and (suspected) exposure to other viral communicable diseases: Secondary | ICD-10-CM | POA: Insufficient documentation

## 2019-03-21 LAB — SARS CORONAVIRUS 2 (TAT 6-24 HRS): SARS Coronavirus 2: NEGATIVE

## 2019-03-23 ENCOUNTER — Ambulatory Visit (HOSPITAL_BASED_OUTPATIENT_CLINIC_OR_DEPARTMENT_OTHER): Payer: Medicaid Other | Attending: Internal Medicine | Admitting: Internal Medicine

## 2019-03-23 ENCOUNTER — Other Ambulatory Visit: Payer: Self-pay

## 2019-03-23 DIAGNOSIS — G4710 Hypersomnia, unspecified: Secondary | ICD-10-CM | POA: Insufficient documentation

## 2019-03-23 DIAGNOSIS — G4733 Obstructive sleep apnea (adult) (pediatric): Secondary | ICD-10-CM | POA: Diagnosis not present

## 2019-03-23 DIAGNOSIS — G4730 Sleep apnea, unspecified: Secondary | ICD-10-CM

## 2019-03-23 DIAGNOSIS — R0683 Snoring: Secondary | ICD-10-CM | POA: Diagnosis present

## 2019-03-29 NOTE — Procedures (Signed)
   NAME: Lisa Herrera DATE OF BIRTH:  [birth year] MEDICAL RECORD NUMBER [id number]  LOCATION: [facility]  PHYSICIAN: Deretha Emory  DATE OF STUDY: 03/23/2019  SLEEP STUDY TYPE: Nocturnal Polysomnogram               REFERRING PHYSICIAN: Deretha Emory, MD  INDICATION FOR STUDY: witnessed apnea, snoring, awakening gasping for breath  EPWORTH SLEEPINESS SCORE:  8 HEIGHT: 5\' 2"  (157.5 cm)  WEIGHT: 252 lb (114.3 kg)    Body mass index is 46.09 kg/m.  NECK SIZE: 16 in.  MEDICATIONS Patient self administered medications include: N/A. Medications administered during study include No sleep medicine administered.  SLEEP STUDY TECHNIQUE A multi-channel overnight Polysomnography study was performed. The channels recorded and monitored were central and occipital EEG, electrooculogram (EOG), submentalis EMG (chin), nasal and oral airflow, thoracic and abdominal wall motion, anterior tibialis EMG, snore microphone, electrocardiogram, and a pulse oximetry.  TECHNICAL COMMENTS Comments added by Technician: Patient was restless all through the night. NONE. Patient requested to end study early due to discomfort with equipment. Comments added by Scorer: N/A  SLEEP ARCHITECTURE The study was initiated at 10:02:09 PM and terminated at 4:03:19 AM. The total recorded time was 361.2 minutes. EEG confirmed total sleep time was 116.4 minutes yielding a sleep efficiency of 32.2%. Sleep onset after lights out was 34.8 minutes. The patient did not attain REM sleep. The patient spent 43.83% of the night in stage N1 sleep, 56.17% in stage N2 sleep, 0.00% in stage N3 and 0% in REM. Wake after sleep onset (WASO) was 210.0 minutes. The Arousal Index was 81.0/hour.  RESPIRATORY PARAMETERS There were a total of 137 respiratory disturbances out of which 133 were apneas ( 133 obstructive, 0 mixed, 0 central) and 4 hypopneas. The apnea/hypopnea index (AHI) was 70.6 events/hour. The central sleep  apnea index was 0.0 events/hour. The NREM AHI was 70.6 events/hour. The supine AHI was 69.2 events/hour and the non supine AHI was 72/hr. Respiratory disturbances were associated with oxygen desaturation down to a nadir of 86.00% during sleep. The mean oxygen saturation during the study was 96.98%. The cumulative time under 88% oxygen saturation was 0.5 minutes.  LEG MOVEMENT DATA The total leg movements were with a resulting leg movement index of /hr . Associated arousal with leg movement index was 1.5/hr.  CARDIAC DATA The underlying cardiac rhythm was most consistent with sinus rhythm. Mean heart rate during sleep was 94.83 bpm. Additional rhythm abnormalities include None.  IMPRESSIONS  - Severe Obstructive Sleep apnea(OSA) - No Significant Central Sleep Apnea (CSA) - Mild Oxygen Desaturation - Low sleep efficiency due to discomfort per patient.   DIAGNOSIS  - Obstructive Sleep Apnea (327.23 [G47.33 ICD-10])  RECOMMENDATIONS  - CPAP would be preferred treatment modality. This could be started with an APAP trial or by CPAP titration in lab.   Marland Kitchen Sleep specialist, American Board of Internal Medicine  ELECTRONICALLY SIGNED ON:  03/29/2019, 10:22 AM [facility] PH: [phone]   FX: [phone] ACCREDITED BY THE AMERICAN ACADEMY OF SLEEP MEDICINE

## 2019-04-03 ENCOUNTER — Other Ambulatory Visit: Payer: Self-pay | Admitting: Physician Assistant

## 2019-04-03 DIAGNOSIS — R6889 Other general symptoms and signs: Secondary | ICD-10-CM

## 2019-04-06 ENCOUNTER — Ambulatory Visit
Admission: RE | Admit: 2019-04-06 | Discharge: 2019-04-06 | Disposition: A | Discharge status: Home / Self Care | Payer: Medicaid Other | Source: Ambulatory Visit | Attending: Physician Assistant | Admitting: Physician Assistant

## 2019-04-06 DIAGNOSIS — R6889 Other general symptoms and signs: Secondary | ICD-10-CM

## 2019-04-08 ENCOUNTER — Ambulatory Visit (INDEPENDENT_AMBULATORY_CARE_PROVIDER_SITE_OTHER): Payer: Medicaid Other

## 2019-04-08 ENCOUNTER — Other Ambulatory Visit: Payer: Self-pay

## 2019-04-08 VITALS — BP 100/69 | HR 93 | Wt 256.0 lb

## 2019-04-08 DIAGNOSIS — Z3201 Encounter for pregnancy test, result positive: Secondary | ICD-10-CM

## 2019-04-08 DIAGNOSIS — Z3480 Encounter for supervision of other normal pregnancy, unspecified trimester: Secondary | ICD-10-CM | POA: Insufficient documentation

## 2019-04-08 LAB — POCT URINE PREGNANCY: Preg Test, Ur: POSITIVE — AB

## 2019-04-08 NOTE — Progress Notes (Signed)
Lisa Herrera presents today for UPT. She has no unusual complaints.  LMP:02/17/2019    OBJECTIVE: Appears well, in no apparent distress.  OB History    Gravida  2   Para  1   Term  1   Preterm      AB      Living  1     SAB      TAB      Ectopic      Multiple  0   Live Births  1          Home UPT Result:POSITIVE X 2 In-Office UPT result: POSITIVE  I have reviewed the patient's medical, obstetrical, social, and family histories, and medications.   ASSESSMENT: Positive pregnancy test  PLAN Prenatal care to be completed at: [facility]

## 2019-04-14 ENCOUNTER — Encounter (HOSPITAL_COMMUNITY): Payer: Self-pay | Admitting: Obstetrics and Gynecology

## 2019-04-14 ENCOUNTER — Other Ambulatory Visit: Payer: Self-pay

## 2019-04-14 ENCOUNTER — Inpatient Hospital Stay (HOSPITAL_COMMUNITY): Payer: Medicaid Other

## 2019-04-14 ENCOUNTER — Inpatient Hospital Stay (HOSPITAL_COMMUNITY)
Admission: AD | Admit: 2019-04-14 | Discharge: 2019-04-14 | Disposition: A | Discharge status: Home / Self Care | Payer: Medicaid Other | Attending: Obstetrics and Gynecology | Admitting: Obstetrics and Gynecology

## 2019-04-14 DIAGNOSIS — O34219 Maternal care for unspecified type scar from previous cesarean delivery: Secondary | ICD-10-CM | POA: Insufficient documentation

## 2019-04-14 DIAGNOSIS — O208 Other hemorrhage in early pregnancy: Secondary | ICD-10-CM | POA: Diagnosis not present

## 2019-04-14 DIAGNOSIS — Z3A08 8 weeks gestation of pregnancy: Secondary | ICD-10-CM

## 2019-04-14 DIAGNOSIS — O4691 Antepartum hemorrhage, unspecified, first trimester: Secondary | ICD-10-CM

## 2019-04-14 DIAGNOSIS — Z3A01 Less than 8 weeks gestation of pregnancy: Secondary | ICD-10-CM | POA: Diagnosis not present

## 2019-04-14 DIAGNOSIS — O418X10 Other specified disorders of amniotic fluid and membranes, first trimester, not applicable or unspecified: Secondary | ICD-10-CM | POA: Diagnosis not present

## 2019-04-14 DIAGNOSIS — Z3491 Encounter for supervision of normal pregnancy, unspecified, first trimester: Secondary | ICD-10-CM

## 2019-04-14 DIAGNOSIS — O4690 Antepartum hemorrhage, unspecified, unspecified trimester: Secondary | ICD-10-CM

## 2019-04-14 DIAGNOSIS — O468X1 Other antepartum hemorrhage, first trimester: Secondary | ICD-10-CM | POA: Diagnosis not present

## 2019-04-14 DIAGNOSIS — R1032 Left lower quadrant pain: Secondary | ICD-10-CM | POA: Insufficient documentation

## 2019-04-14 DIAGNOSIS — O26891 Other specified pregnancy related conditions, first trimester: Secondary | ICD-10-CM | POA: Diagnosis not present

## 2019-04-14 DIAGNOSIS — N939 Abnormal uterine and vaginal bleeding, unspecified: Secondary | ICD-10-CM | POA: Diagnosis present

## 2019-04-14 LAB — URINALYSIS, ROUTINE W REFLEX MICROSCOPIC
Bacteria, UA: NONE SEEN
Bilirubin Urine: NEGATIVE
Glucose, UA: NEGATIVE mg/dL
Ketones, ur: NEGATIVE mg/dL
Nitrite: NEGATIVE
Protein, ur: NEGATIVE mg/dL
Specific Gravity, Urine: 1.017 (ref 1.005–1.030)
pH: 5 (ref 5.0–8.0)

## 2019-04-14 LAB — WET PREP, GENITAL
Sperm: NONE SEEN
Trich, Wet Prep: NONE SEEN
Yeast Wet Prep HPF POC: NONE SEEN

## 2019-04-14 LAB — CBC
HCT: 38.4 % (ref 36.0–46.0)
Hemoglobin: 12.6 g/dL (ref 12.0–15.0)
MCH: 29 pg (ref 26.0–34.0)
MCHC: 32.8 g/dL (ref 30.0–36.0)
MCV: 88.3 fL (ref 80.0–100.0)
Platelets: 249 10*3/uL (ref 150–400)
RBC: 4.35 MIL/uL (ref 3.87–5.11)
RDW: 13.6 % (ref 11.5–15.5)
WBC: 9.9 10*3/uL (ref 4.0–10.5)
nRBC: 0 % (ref 0.0–0.2)

## 2019-04-14 LAB — HCG, QUANTITATIVE, PREGNANCY: hCG, Beta Chain, Quant, S: 93900 m[IU]/mL — ABNORMAL HIGH (ref <5)

## 2019-04-14 NOTE — MAU Provider Note (Signed)
History     CSN: [id number]  Arrival date and time: 04/14/19 3016   First Provider Initiated Contact with Patient 04/14/19 0152      Chief Complaint  Patient presents with  . Vaginal Bleeding   Lisa Herrera is a 24 y.o. G2P1 at [redacted]w[redacted]d by LMP who presents to MAU with complaints of vaginal bleeding and abdominal pain. She reports symptoms started occurring earlier today. Reports 3 different occurrences of vaginal bleeding throughout the day - first was light pink when she wiped, second was reddish brown discharge, then lastly had bright red vaginal bleeding. She reports all occurrences are just when she wipes and denies having to wear a pad or panty liner. She reports vaginal bleeding is associated with abdominal pain. Describes abdominal pain as cramping that is specific to LLQ and radiates around to back, rates pain 4/10- has not taken any medication for abdominal pain.   OB History    Gravida  2   Para  1   Term  1   Preterm      AB      Living  1     SAB      TAB      Ectopic      Multiple  0   Live Births  1           Past Medical History:  Diagnosis Date  . Gallstones   . Medical history non-contributory   . Migraine     Past Surgical History:  Procedure Laterality Date  . CESAREAN SECTION N/A 01/14/2016   Procedure: CESAREAN SECTION;  Surgeon: Osborne Oman, MD;  Location: [place];  Service: Obstetrics;  Laterality: N/A;  . CHOLECYSTECTOMY    . NO PAST SURGERIES      Family History  Problem Relation Age of Onset  . Breast cancer Maternal Aunt        50's    Social History   Tobacco Use  . Smoking status: Never Smoker  . Smokeless tobacco: Never Used  Substance Use Topics  . Alcohol use: No    Alcohol/week: 0.0 standard drinks  . Drug use: No    Allergies: No Known Allergies  Medications Prior to Admission  Medication Sig Dispense Refill Last Dose  . Prenatal Vit-Fe Fumarate-FA (PRENATAL MULTIVITAMIN) TABS tablet Take 1  tablet by mouth daily at 12 noon.   04/14/2019 at Unknown time  . amoxicillin (AMOXIL) 500 MG capsule Take 1 capsule (500 mg total) by mouth 3 (three) times daily. (Patient not taking: Reported on 10/03/2017) 30 capsule 0   . fluticasone (FLONASE) 50 MCG/ACT nasal spray Place 1 spray into both nostrils daily. (Patient not taking: Reported on 10/03/2017) 16 g 2   . nitrofurantoin, macrocrystal-monohydrate, (MACROBID) 100 MG capsule Take 1 capsule (100 mg total) by mouth 2 (two) times daily. (Patient not taking: Reported on 02/24/2018) 10 capsule 0   . phenazopyridine (PYRIDIUM) 200 MG tablet Take 1 tablet (200 mg total) by mouth 3 (three) times daily. (Patient not taking: Reported on 02/24/2018) 6 tablet 0   . promethazine-phenylephrine (PROMETHAZINE VC) 6.25-5 MG/5ML SYRP Take 5 mLs by mouth every 8 (eight) hours as needed for congestion. (Patient not taking: Reported on 10/03/2017) 100 mL 0     Review of Systems  Constitutional: Negative.   Respiratory: Negative.   Cardiovascular: Negative.   Gastrointestinal: Positive for abdominal pain. Negative for constipation, diarrhea, nausea and vomiting.  Genitourinary: Positive for vaginal bleeding. Negative for difficulty urinating, dysuria,  frequency, pelvic pain and urgency.  Musculoskeletal: Positive for back pain.  Neurological: Negative.    Physical Exam   Last menstrual period 02/17/2019, unknown if currently breastfeeding.  Physical Exam  Nursing note and vitals reviewed. Constitutional: She is oriented to person, place, and time. She appears well-developed and well-nourished. She appears distressed.  Pt grimacing   Cardiovascular: Normal rate and regular rhythm.  Respiratory: Effort normal and breath sounds normal. No respiratory distress. She has no wheezes.  GI: Soft. There is abdominal tenderness. There is no rebound and no guarding.  Tenderness in LLQ and suprapubic region   Genitourinary:    Vaginal bleeding present.  There is bleeding  in the vagina.    Genitourinary Comments: Pelvic exam: Cervix pink, visually closed, without lesion, scant bright red vaginal bleeding without clots, vaginal walls and external genitalia normal Bimanual exam: Cervix 0/long/high, firm, anterior, neg CMT, uterus nontender, nonenlarged, right adnexa without tenderness, enlargement, or mass,  left adnexa with tenderness, no enlargement, or mass palpated.    Musculoskeletal:        General: No edema. Normal range of motion.  Neurological: She is alert and oriented to person, place, and time.  Psychiatric: She has a normal mood and affect. Her behavior is normal. Thought content normal.    MAU Course  Procedures  MDM  Spanish interpreter used throughout MAU visit   Orders Placed This Encounter  Procedures  . Wet prep, genital  . US OB LESS THAN 14 WEEKS WITH OB TRANSVAGINAL  . Urinalysis, Routine w reflex microscopic  . CBC  . hCG, quantitative, pregnancy   Labs and [place] report reviewed:  Results for orders placed or performed during the hospital encounter of 04/14/19 (from the past 24 hour(s))  CBC     Status: None   Collection Time: 04/14/19  1:38 AM  Result Value Ref Range   WBC 9.9 4.0 - 10.5 K/uL   RBC 4.35 3.87 - 5.11 MIL/uL   Hemoglobin 12.6 12.0 - 15.0 g/dL   HCT 69.6 29.5 - 28.4 %   MCV 88.3 80.0 - 100.0 fL   MCH 29.0 26.0 - 34.0 pg   MCHC 32.8 30.0 - 36.0 g/dL   RDW 13.2 44.0 - 10.2 %   Platelets 249 150 - 400 K/uL   nRBC 0.0 0.0 - 0.2 %  hCG, quantitative, pregnancy     Status: Abnormal   Collection Time: 04/14/19  1:38 AM  Result Value Ref Range   hCG, Beta Chain, Quant, S 93,900 (H) <5 mIU/mL  Wet prep, genital     Status: Abnormal   Collection Time: 04/14/19  2:05 AM   Specimen: PATH Cytology Cervicovaginal Ancillary Only  Result Value Ref Range   Yeast Wet Prep HPF POC NONE SEEN NONE SEEN   Trich, Wet Prep NONE SEEN NONE SEEN   Clue Cells Wet Prep HPF POC PRESENT (A) NONE SEEN   WBC, Wet Prep HPF POC FEW (A)  NONE SEEN   Sperm NONE SEEN   Urinalysis, Routine w reflex microscopic     Status: Abnormal   Collection Time: 04/14/19  2:06 AM  Result Value Ref Range   Color, Urine YELLOW YELLOW   APPearance HAZY (A) CLEAR   Specific Gravity, Urine 1.017 1.005 - 1.030   pH 5.0 5.0 - 8.0   Glucose, UA NEGATIVE NEGATIVE mg/dL   Hgb urine dipstick LARGE (A) NEGATIVE   Bilirubin Urine NEGATIVE NEGATIVE   Ketones, ur NEGATIVE NEGATIVE mg/dL   Protein, ur  NEGATIVE NEGATIVE mg/dL   Nitrite NEGATIVE NEGATIVE   Leukocytes,Ua TRACE (A) NEGATIVE   RBC / HPF 0-5 0 - 5 RBC/hpf   WBC, UA 6-10 0 - 5 WBC/hpf   Bacteria, UA NONE SEEN NONE SEEN   Squamous Epithelial / LPF 0-5 0 - 5   Mucus PRESENT    US OB LESS THAN 14 WEEKS WITH OB TRANSVAGINAL  Result Date: 04/14/2019 CLINICAL DATA:  Pregnant patient in first-trimester pregnancy with vaginal bleeding. Left lower quadrant pain. EXAM: OBSTETRIC <14 WK [place] AND TRANSVAGINAL OB US TECHNIQUE: Both transabdominal and transvaginal ultrasound examinations were performed for complete evaluation of the gestation as well as the maternal uterus, adnexal regions, and pelvic cul-de-sac. Transvaginal technique was performed to assess early pregnancy. COMPARISON:  None this pregnancy. FINDINGS: Intrauterine gestational sac: Single Yolk sac:  Visualized. Embryo:  Visualized. Cardiac Activity: Visualized. Heart Rate: 166 bpm CRL:  15.8 mm   7 w   6  d                  [place] EDC: 11/25/2019 Subchorionic hemorrhage:  Small Maternal uterus/adnexae: Both ovaries are visualized and are normal. Probable corpus luteal cyst in the right ovary. No adnexal mass. No pelvic free fluid. IMPRESSION: 1. Single live intrauterine pregnancy estimated gestational age [redacted] weeks 6 days based on crown-rump length for ultrasound [facility] 11/25/2019. 2. Small subchorionic hemorrhage. 3. Normal sonographic appearance of both ovaries. Electronically Signed   By: Narda Rutherford M.D.   On: 04/14/2019 03:13   Discussed  results of labs and [place] with patient using spanish interpreter. Educated and discussed [facility], what to expect and warning signs. Discussed pelvic rest. Educated on reasons to return to MAU. Patient plans to receive prenatal care at [facility] - has appointment scheduled in the office next month.   Return to MAU as needed. Pt stable at time of discharge.   Assessment and Plan   1. Normal IUP (intrauterine pregnancy) on prenatal ultrasound, first trimester   2. Vaginal bleeding during pregnancy   3. Subchorionic hematoma in first trimester, single or unspecified fetus   4. [redacted] weeks gestation of pregnancy    Discharge home Follow up as scheduled in the office for prenatal care Return to MAU as needed for reasons discussed and/or emergencies  [facility] precautions and warning signs  Pelvic rest   Follow-up Information    [facility] Follow up.   Specialty: Obstetrics and Gynecology Why: Return to MAU as needed for reasons discussed Contact information: [address] [id number] mc [place] [address] [phone]         Allergies as of 04/14/2019   No Known Allergies     Medication List    STOP taking these medications   amoxicillin 500 MG capsule Commonly known as: AMOXIL   nitrofurantoin (macrocrystal-monohydrate) 100 MG capsule Commonly known as: MACROBID   phenazopyridine 200 MG tablet Commonly known as: PYRIDIUM   promethazine-phenylephrine 6.25-5 MG/5ML Syrp Commonly known as: PROMETHAZINE VC     TAKE these medications   fluticasone 50 MCG/ACT nasal spray Commonly known as: FLONASE Place 1 spray into both nostrils daily.   prenatal multivitamin Tabs tablet Take 1 tablet by mouth daily at 12 noon.       Sharyon Cable CNM 04/14/2019, 4:07 AM

## 2019-04-14 NOTE — MAU Note (Signed)
Patient reports bleeding that started today. She states "I've had bleeding 3 times. It started as pink and turned red." Patient denies any clots. Reports cramping on left side.

## 2019-04-14 NOTE — Discharge Instructions (Signed)
Restriccin de la Consulting civil engineer Activity Restriction During Pregnancy El mdico puede recomendarle restricciones de actividades especficas durante el embarazo por varios motivos. La restriccin de la actividad puede requerir que limite actividades que requieren un gran esfuerzo, como el ejercicio, levantar objetos o el sexo. El tipo de restriccin de la actividad ser diferente para cada persona, segn el riesgo o los problemas que tenga. Es posible que se le recomiende restringir la actividad durante un perodo de tiempo hasta que nazca el beb. Por qu se recomienda restringir la actividad? Se puede recomendar la restriccin de la actividad si:  La placenta cubre de forma parcial o total la abertura del cuello uterino (placenta previa).  Hay sangrado entre la pared del tero y el saco amnitico en el primer trimestre de Psychiatrist (hemorragia subcorinica).  El [place] de parto comienza muy pronto (trabajo de parto prematuro).  Usted tiene antecedentes de abortos espontneos.  Tiene una afeccin que provoca presin arterial alta durante el embarazo (preeclampsia o eclampsia).  Est embarazada de ms de un beb.  El beb no est creciendo bien. Cules son los riesgos? Los riesgos dependen de Soil scientist. El reposo absoluto en cama tiene la mayor cantidad de riesgos fsicos y emocionales y ya no se recomienda de forma rutinaria. Los riesgos del reposo absoluto en cama incluyen:  Prdida del acondicionamiento muscular debido a la falta de movimiento.  Cogulos de [place].  Aislamiento social.  Depresin.  Prdida de ingresos. Hable con su equipo de atencin [employer] restriccin de actividades para decidir si es lo mejor para usted y el beb. Incluso si tiene [employer], es posible que pueda continuar con sus niveles normales de [place] con el control cuidadoso de su equipo de atencin mdica. Siga estas indicaciones en su  casa: Si es necesario, segn su salud general y la salud del beb, el mdico decidir qu tipo de restriccin de actividades es adecuado para usted. Las restricciones de actividades pueden incluir lo siguiente:  No levantar ningn objeto que pese ms de 10 libras (4,5 kg).  Evitar las actividades que exijan mucho esfuerzo fsico.  No levantar objetos ni hacer esfuerzos.  Hacer reposo sentada o acostada durante algunos perodos de [place] a lo largo del Futures trader. Se podr recomendar reposo plvico junto con la restriccin de [employer]. Si se recomienda reposo plvico, entonces:  No tenga sexo, orgasmos ni use estimulacin sexual.  No use tampones. No se haga duchas vaginales. No se introduzca nada en la vagina.  No levante ningn objeto que pese ms de 10libras (4.5kg).  Evite actividades que requieran mucho esfuerzo.  Evite cualquier actividad que requiera esfuerzo de los msculos de la pelvis, como ponerse en cuclillas. Preguntas para hacerle al mdico  Por qu me limitan la actividad?  Cmo afectar a mi cuerpo la restriccin de la actividad?  Por qu el reposo es til para m y el beb?  Qu actividades puedo hacer?  Cundo puedo [employer] normales? Cundo debo buscar asistencia mdica inmediata? Solicite atencin mdica si tiene:  Sangrado vaginal.  Secrecin vaginal.  Clicos en la zona inferior del abdomen.  Contracciones regulares.  Un dolor sordo en la parte baja de la espalda. Resumen  El mdico puede recomendarle restricciones de actividades especficas durante el embarazo por varios motivos.  La restriccin de actividades puede requerir que limite actividades como el ejercicio, levantar objetos, el sexo o cualquier otra actividad que requiera un gran esfuerzo.  Hable con su equipo  de atencin [employer] y beneficios de la restriccin de actividades para decidir si es lo mejor para usted y el beb.  Comunquese de  inmediato con el mdico si piensa que tiene contracciones o si observa sangrado vaginal, secrecin o tiene clicos. Esta informacin no tiene Theme park manager el consejo del mdico. Asegrese de hacerle al mdico cualquier pregunta que tenga. Document Revised: 08/07/2017 Document Reviewed: 08/07/2017 Elsevier Patient Education  2020 [employer].

## 2019-04-15 LAB — GC/CHLAMYDIA PROBE AMP (~~LOC~~) NOT AT ARMC
Chlamydia: NEGATIVE
Comment: NEGATIVE
Comment: NORMAL
Neisseria Gonorrhea: NEGATIVE

## 2019-04-30 ENCOUNTER — Ambulatory Visit (INDEPENDENT_AMBULATORY_CARE_PROVIDER_SITE_OTHER): Payer: Medicaid Other

## 2019-04-30 DIAGNOSIS — Z3480 Encounter for supervision of other normal pregnancy, unspecified trimester: Secondary | ICD-10-CM

## 2019-04-30 MED ORDER — BLOOD PRESSURE KIT DEVI: 1.0000 | 0 refills | Status: DC | PRN

## 2019-04-30 NOTE — Progress Notes (Signed)
  Virtual Visit via Telephone Note  I connected with Lisa Herrera on 04/30/19 at 10:30 AM EST by telephone and verified that I am speaking with the correct person using two identifiers.  Location: Patient: Lisa Herrera  Provider: Eduard Roux, CMA    I discussed the limitations, risks, security and privacy concerns of performing an evaluation and management service by telephone and the availability of in person appointments. I also discussed with the patient that there may be a patient responsible charge related to this service. The patient expressed understanding and agreed to proceed.   History of Present Illness: PRENATAL INTAKE SUMMARY  Lisa Herrera presents today New OB Nurse Interview.  OB History    Gravida  2   Para  1   Term  1   Preterm      AB      Living  1     SAB      TAB      Ectopic      Multiple  0   Live Births  1          I have reviewed the patient's medical, obstetrical, social, and family histories, medications, and available lab results.  SUBJECTIVE She has no unusual complaints   Observations/Objective: Initial nurse interview for history/labs (New OB)  EDD: 11/14/19  10w 2d G2/P1 FHT: non face to face interview  GENERAL APPEARANCE: alert, non face to face interview  Assessment and Plan: Normal pregnancy Prenatal care at [facility] BP cuff sent to [facility] Babyscripts explained in detail  Labs/exam to be completed at provider visit   Follow Up Instructions:   I discussed the assessment and treatment plan with the patient. The patient was provided an opportunity to ask questions and all were answered. The patient agreed with the plan and demonstrated an understanding of the instructions.   The patient was advised to call back or seek an in-person evaluation if the symptoms worsen or if the condition fails to improve as anticipated.  I provided 20 minutes of non-face-to-face time during this encounter.   Dalphine Handing,  CMA

## 2019-05-01 NOTE — Progress Notes (Signed)
Patient seen and assessed by nursing staff during this encounter. I have reviewed the chart and agree with the documentation and plan.  Scheryl Darter, MD 05/01/2019 1:13 PM

## 2019-05-12 ENCOUNTER — Encounter: Payer: Self-pay | Admitting: Advanced Practice Midwife

## 2019-05-12 ENCOUNTER — Ambulatory Visit (INDEPENDENT_AMBULATORY_CARE_PROVIDER_SITE_OTHER): Payer: Medicaid Other | Admitting: Advanced Practice Midwife

## 2019-05-12 ENCOUNTER — Other Ambulatory Visit: Payer: Self-pay

## 2019-05-12 VITALS — BP 107/69 | HR 109 | Temp 99.0°F | Wt 257.8 lb

## 2019-05-12 DIAGNOSIS — Z8719 Personal history of other diseases of the digestive system: Secondary | ICD-10-CM | POA: Insufficient documentation

## 2019-05-12 DIAGNOSIS — N644 Mastodynia: Secondary | ICD-10-CM

## 2019-05-12 DIAGNOSIS — Z3480 Encounter for supervision of other normal pregnancy, unspecified trimester: Secondary | ICD-10-CM

## 2019-05-12 DIAGNOSIS — O34219 Maternal care for unspecified type scar from previous cesarean delivery: Secondary | ICD-10-CM | POA: Diagnosis not present

## 2019-05-12 DIAGNOSIS — Z98891 History of uterine scar from previous surgery: Secondary | ICD-10-CM

## 2019-05-12 DIAGNOSIS — O99211 Obesity complicating pregnancy, first trimester: Secondary | ICD-10-CM

## 2019-05-12 DIAGNOSIS — O99213 Obesity complicating pregnancy, third trimester: Secondary | ICD-10-CM | POA: Insufficient documentation

## 2019-05-12 DIAGNOSIS — K2100 Gastro-esophageal reflux disease with esophagitis, without bleeding: Secondary | ICD-10-CM

## 2019-05-12 DIAGNOSIS — O9229 Other disorders of breast associated with pregnancy and the puerperium: Secondary | ICD-10-CM

## 2019-05-12 DIAGNOSIS — O219 Vomiting of pregnancy, unspecified: Secondary | ICD-10-CM

## 2019-05-12 DIAGNOSIS — Z3A12 12 weeks gestation of pregnancy: Secondary | ICD-10-CM

## 2019-05-12 MED ORDER — PANTOPRAZOLE SODIUM 20 MG PO TBEC: 20.0000 mg | DELAYED_RELEASE_TABLET | Freq: Two times a day (BID) | ORAL | 5 refills | Status: DC

## 2019-05-12 MED ORDER — METOCLOPRAMIDE HCL 10 MG PO TABS: 10.0000 mg | ORAL_TABLET | Freq: Three times a day (TID) | ORAL | 2 refills | Status: DC | PRN

## 2019-05-12 MED ORDER — ASPIRIN EC 81 MG PO TBEC: 81.0000 mg | DELAYED_RELEASE_TABLET | Freq: Every day | ORAL | 5 refills | Status: DC

## 2019-05-12 NOTE — Progress Notes (Signed)
Pt presents for NOB visit w/ spanish interpreter   Pt requests repeat c-section - previous c/s was d/t breech  Normal pap 05/2018 GC/CT negative 04/14/2019  Declined flu vaccine

## 2019-05-12 NOTE — Patient Instructions (Signed)
Primer trimestre de embarazo °First Trimester of Pregnancy °El primer trimestre de embarazo se extiende desde la semana 1 hasta el final de la semana 13 (mes 1 al mes 3). Una semana después de que un espermatozoide fecunda un óvulo, este se implantará en la pared uterina. Este embrión comenzará a desarrollarse hasta convertirse en un bebé. Sus genes y los de su pareja forman el bebé. Los genes del varón determinan si será un niño o una niña. Entre la semana 6 y la 8, se forman los ojos y el rostro, y los latidos del corazón pueden verse en una ecografía. Al final de las 12 semanas, todos los órganos del bebé están formados. °Ahora que está embarazada, querrá hacer todo lo que esté a su alcance para tener un bebé sano. Dos de las cosas más importantes son tener un buen cuidado prenatal y seguir las indicaciones del médico. El cuidado prenatal incluye toda la asistencia médica que usted recibe antes del nacimiento del bebé. Esto ayudará a prevenir, detectar y tratar cualquier problema durante el embarazo y el parto. °Durante el primer trimestre, ocurren cambios en el cuerpo. °Su organismo atraviesa por muchos cambios durante el embarazo. Estos cambios varían de una mujer a otra. °· Al principio, puede aumentar o bajar algunos kilos. °· Puede tener malestar estomacal (náuseas) y vomitar. Si no puede controlar los vómitos, llame al médico. °· Puede cansarse con facilidad. °· Es posible que tenga dolores de cabeza que pueden aliviarse con ciertos medicamentos. Todos los medicamentos que tome deben estar aprobados por el médico. °· Puede orinar con mayor frecuencia. El dolor al orinar puede significar que usted tiene una infección de la vejiga. °· Debido al embarazo, puede tener acidez estomacal. °· Puede estar estreñida, ya que ciertas hormonas enlentecen los movimientos de los músculos que empujan las heces a través del intestino. °· Pueden aparecer hemorroides o hincharse las venas (venas varicosas). °· Las mamas  pueden empezar a agrandarse y estar sensibles. Los pezones pueden sobresalir más, y el tejido que los rodea (aréola) tornarse más oscuro. °· Las encías pueden sangrar y estar sensibles al cepillado y al hilo dental. °· Pueden aparecer zonas oscuras o manchas (cloasma, máscara del embarazo) en el rostro. Esto probablemente se atenuará después del nacimiento del bebé. °· Los períodos menstruales se interrumpirán. °· Tal vez no tenga apetito. °· Puede sentir un fuerte deseo de consumir ciertos alimentos. °· Puede tener cambios a nivel emocional día a día, por ejemplo, por momentos puede estar emocionada por el embarazo y por otros preocuparse porque algo pueda salir mal con el embarazo o el bebé. °· Tendrá sueños más vívidos y extraños. °· Tal vez haya cambios en el cabello. Esto cambios pueden incluir su engrosamiento, crecimiento rápido y cambios en la textura. Además, a algunas mujeres se les cae el cabello durante o después del embarazo, o tienen el cabello seco o fino. Lo más probable es que el cabello se le normalice después del nacimiento del bebé. °Qué debe esperar en las visitas prenatales °Durante una visita prenatal de rutina: °· La pesarán para asegurarse de que usted y el bebé están creciendo normalmente. °· Le tomarán la presión arterial. °· Le medirán el abdomen para controlar el desarrollo del bebé. °· Se escucharán los latidos cardíacos fetales entre las semanas 10 y 14 de embarazo. °· Se analizarán los resultados de los estudios solicitados en visitas anteriores. °El médico puede preguntarle lo siguiente: °· Cómo se siente. °· Si siente los movimientos del bebé. °· Si ha tenido síntomas anormales, como pérdida de líquido, sangrado,   dolores de cabeza intensos o cólicos abdominales. °· Si está consumiendo algún producto que contenga tabaco, como cigarrillos, tabaco de mascar y cigarrillos electrónicos. °· Si tiene alguna pregunta. °Otros estudios que pueden realizarse durante el primer trimestre  incluyen lo siguiente: °· Análisis de sangre para determinar el grupo sanguíneo y detectar la presencia de infecciones previas. Las pruebas también se utilizarán para determinar si tiene bajo nivel de hierro (anemia) y proteínas en los glóbulos rojos (anticuerpos Rh). En función de sus factores de riesgo, o si ya tuvo diabetes durante un embarazo anterior, le pueden hacer pruebas para determinar si tiene un nivel alto de azúcar en la sangre, algo que puede afectar a embarazadas (diabetes gestacional). °· Análisis de orina para detectar infecciones, diabetes o proteínas en la orina. °· Una ecografía para confirmar que el bebé crece y se desarrolla correctamente. °· Estudios fetales para detectar problemas de la médula espinal (espina bífida) y síndrome de Down. °· Prueba del VIH (virus de inmunodeficiencia humana). Los exámenes prenatales de rutina incluyen la prueba de detección del VIH, a menos que decida no realizársela. °· Es posible que necesite otras pruebas adicionales. °Siga estas instrucciones en su casa: °Medicamentos °· Siga las indicaciones del médico en relación con el uso de medicamentos. Durante el embarazo, hay medicamentos que pueden tomarse y otros que no. °· Tome vitaminas prenatales que contengan por lo menos 600 microgramos (?g) de ácido fólico. °· Si está estreñida, tome un laxante suave, si el médico lo autoriza. °Comida y bebida ° °· Lleve una dieta equilibrada que incluya gran cantidad de frutas y verduras frescas, cereales integrales, buenas fuentes de proteínas como carnes magras, huevos o tofu, y lácteos descremados. El médico la ayudará a determinar la cantidad de peso que puede aumentar. °· No coma carne cruda ni quesos sin cocinar. Estos elementos contienen gérmenes que pueden causar defectos congénitos en el bebé. °· La ingesta diaria de cuatro o cinco comidas pequeñas en lugar de tres comidas abundantes puede ayudar a aliviar las náuseas y los vómitos. Si empieza a tener náuseas,  comer algunas galletas saladas puede ser de ayuda. Beber líquidos entre las comidas, en lugar de tomarlos durante las comidas, también puede ayudar a aliviar las náuseas y los vómitos. °· Limite el consumo de alimentos con alto contenido de grasas y azúcares procesados, como alimentos fritos o dulces. °· Para evitar el estreñimiento: °? Consuma alimentos ricos en fibra, como frutas y verduras frescas, cereales integrales y frijoles. °? Beba suficiente líquido para mantener la orina clara o de color amarillo pálido. °Actividad °· Haga ejercicio solamente como se lo haya indicado el médico. La mayoría de las mujeres pueden continuar su rutina de ejercicios durante el embarazo. Intente realizar como mínimo 30 minutos de actividad física por lo menos 5 días a la semana. El ejercicio la ayudará a: °? Controlar su peso. °? Mantenerse en forma. °? Estar preparada para el trabajo de parto y el parto. °· Los dolores, los cólicos en la parte baja del abdomen o los calambres en la cintura son un buen indicio de que debe dejar de hacer ejercicios. Consulte al médico antes de seguir haciendo ejercicios con normalidad. °· Intente no estar de pie durante mucho tiempo. Mueva las piernas con frecuencia si debe estar de pie en un lugar durante mucho tiempo. °· Evite levantar pesos excesivos. °· Use zapatos de tacones bajos y mantenga una buena postura. °· Puede seguir teniendo relaciones sexuales, salvo que el médico le indique lo contrario. °Alivio del dolor y del malestar °·   Use un sostén que le brinde buen soporte para aliviar el dolor de mamas. °· Dese baños de asiento con agua tibia para aliviar el dolor o las molestias causadas por las hemorroides. Use una crema para las hemorroides si el médico la autoriza. °· Descanse con las piernas elevadas si tiene calambres o dolor de cintura. °· Si tiene venas varicosas en las piernas, use medias de descanso. Eleve los pies durante 15 minutos, 3 o 4 veces por día. Limite el consumo de  sal en su dieta. °Cuidados prenatales °· Programe las visitas prenatales para la semana 12 de embarazo. Generalmente se programan cada mes al principio y se hacen más frecuentes en los 2 últimos meses antes del parto. °· Escriba sus preguntas. Llévelas cuando concurra a las visitas prenatales. °· Concurra a todas las visitas prenatales tal como se lo haya indicado el médico. Esto es importante. °Seguridad °· Use el cinturón de seguridad en todo momento mientras conduce. °· Haga una lista de los números de teléfono de emergencia, que incluya los números de teléfono de familiares, amigos, el hospital y los departamentos de policía y bomberos. °Instrucciones generales °· Pídale al médico que la derive a clases de educación prenatal en su localidad. Debe comenzar a tomar las clases antes de que empiece el mes 6 de embarazo. °· Pida ayuda si tiene necesidades nutricionales o de asesoramiento durante el embarazo. El médico puede aconsejarla o derivarla a especialistas para que la ayuden con diferentes necesidades. °· No se dé baños de inmersión en agua caliente, baños turcos ni saunas. °· No se haga duchas vaginales ni use tampones o toallas higiénicas perfumadas. °· No mantenga las piernas cruzadas durante mucho tiempo. °· Evite el contacto con las bandejas sanitarias de los gatos y la tierra que estos animales usan. Estos elementos contienen bacterias que pueden causar defectos congénitos al bebé y la posible pérdida del feto debido a un aborto espontáneo o muerte fetal. °· No fume, no consuma hierbas ni medicamentos que no hayan sido recetados por el médico. Las sustancias químicas que estos productos contienen afectan la formación y el desarrollo del bebé. °· No consuma ningún producto que contenga nicotina o tabaco, como cigarrillos y cigarrillos electrónicos. Si necesita ayuda para dejar de fumar, consulte al médico. Puede recibir asesoramiento y otro tipo de recursos para dejar de fumar. °· Programe una cita con el  dentista. En su casa, lávese los dientes con un cepillo dental blando y pásese el hilo dental con suavidad. °Comuníquese con un médico si: °· Tiene mareos. °· Siente cólicos leves, presión en la pelvis o dolor persistente en el abdomen. °· Tiene náuseas, vómitos o diarrea persistentes. °· Observa una secreción vaginal con mal olor. °· Siente dolor al orinar. °· Observa más hinchazón en la cara, las manos, las piernas o los tobillos. °· Está expuesta a la quinta enfermedad o a la varicela. °· Está expuesta al sarampión alemán (rubéola) y nunca lo había tenido. °Solicite ayuda de inmediato si: °· Tiene fiebre. °· Tiene una pérdida de líquido por la vagina. °· Tiene sangrado o pequeñas pérdidas vaginales. °· Siente dolor intenso o cólicos en el abdomen. °· Sube o baja de peso rápidamente. °· Vomita sangre de color rojo brillante o una sustancia similar a los granos de café. °· Dolor de cabeza intenso. °· Le falta el aire. °· Sufre cualquier tipo de traumatismo, por ejemplo, debido a una caída o un accidente automovilístico. °Resumen °· El primer trimestre de embarazo se extiende desde la semana 1 hasta el final de la   semana 13 (mes 1 al mes 3). °· Su organismo atraviesa por muchos cambios durante el embarazo. Estos cambios varían de una mujer a otra. °· Tendrá visitas prenatales de rutina. Durante esas visitas, el médico la examinará, hablará con usted acerca de los resultados de sus pruebas y le preguntará cómo se siente. °Esta información no tiene como fin reemplazar el consejo del médico. Asegúrese de hacerle al médico cualquier pregunta que tenga. °Document Revised: 06/15/2016 Document Reviewed: 06/15/2016 °Elsevier Patient Education © 2020 [employer] ° °

## 2019-05-12 NOTE — Progress Notes (Signed)
Subjective:   Lisa Herrera is a 24 y.o. G2P1001 at 26w0dby LMP being seen today for her first obstetrical visit.  Her obstetrical history is significant for C/S x 1 for failure to descent and FHR decelerations and has Maternal varicella, non-immune; S/P cesarean section for arrest of descent; and Supervision of other normal pregnancy, antepartum on their problem list.. Patient does intend to breast feed. Pregnancy history fully reviewed.  Patient reports heartburn, nausea and feeling like something is stuck in her throat.  HISTORY: OB History  Gravida Para Term Preterm AB Living  '2 1 1 '$ 0 0 1  SAB TAB Ectopic Multiple Live Births  0 0 0 0 1    # Outcome Date GA Lbr Len/2nd Weight Sex Delivery Anes PTL Lv  2 Current           1 Term 01/14/16 490w1d 02:47 7 lb 2.5 oz (3.245 kg) F CS-LTranv EPI  LIV     Name: Schwarting,GIRL Jacaria     Apgar1: 8  Apgar5: 9    Obstetric Comments  C/S due to breech baby    Past Medical History:  Diagnosis Date  . Gallstones   . Medical history non-contributory   . Migraine    Past Surgical History:  Procedure Laterality Date  . CESAREAN SECTION N/A 01/14/2016   Procedure: CESAREAN SECTION;  Surgeon: UgOsborne OmanMD;  [place] [facility];  Laterality: N/A;  . CHOLECYSTECTOMY    . NO PAST SURGERIES     Family History  Problem Relation Age of Onset  . Breast cancer Maternal Aunt        50's   Social History   Tobacco Use  . Smoking status: Never Smoker  . Smokeless tobacco: Never Used  Substance Use Topics  . Alcohol use: No    Alcohol/week: 0.0 standard drinks  . Drug use: No   No Known Allergies Current Outpatient Medications on File Prior to Visit  Medication Sig Dispense Refill  . Prenatal Vit-Fe Fumarate-FA (PRENATAL MULTIVITAMIN) TABS tablet Take 1 tablet by mouth daily at 12 noon.    . Blood Pressure Monitoring (BLOOD PRESSURE KIT) DEVI 1 Device by Does not apply route as needed. (Patient not  taking: Reported on 05/12/2019) 1 each 0  . fluticasone (FLONASE) 50 MCG/ACT nasal spray Place 1 spray into both nostrils daily. (Patient not taking: Reported on 10/03/2017) 16 g 2   No current facility-administered medications on file prior to visit.     Indications for ASA therapy (per uptodate) One of the following: Previous pregnancy with preeclampsia, especially early onset and with an adverse outcome No Multifetal gestation No Chronic hypertension No Type 1 or 2 diabetes mellitus No Chronic kidney disease No Autoimmune disease (antiphospholipid syndrome, systemic lupus erythematosus) No   Two or more of the following: Nulliparity No Obesity (body mass index >30 kg/m2) Yes Family history of preeclampsia in mother or sister No Age ?35 years No Sociodemographic characteristics (African American race, low socioeconomic level) Yes Personal risk factors (eg, previous pregnancy with low birth weight or small for gestational age infant, previous adverse pregnancy outcome [eg, stillbirth], interval >10 years between pregnancies) No   Indications for early 1 hour GTT (per uptodate)  BMI >25 (>23 in Asian women) AND one of the following  Gestational diabetes mellitus in a previous pregnancy No Glycated hemoglobin ?5.7 percent (39 mmol/mol), impaired glucose tolerance, or impaired fasting glucose on previous testing No First-degree relative with  diabetes Yes High-risk race/ethnicity (eg, African American, Latino, Native American, [place] American, Pacific Islander) Yes History of cardiovascular disease No Hypertension or on therapy for hypertension No High-density lipoprotein cholesterol level <35 mg/dL (0.90 mmol/L) and/or a triglyceride level >250 mg/dL (2.82 mmol/L) No Polycystic ovary syndrome No Physical inactivity No Other clinical condition associated with insulin resistance (eg, severe obesity, acanthosis nigricans) No Previous birth of an infant weighing ?4000 g No Previous  stillbirth of unknown cause No Exam   Vitals:   05/12/19 0909  BP: 107/69  Pulse: (!) 109  Temp: 99 F (37.2 C)  Weight: 257 lb 12.8 oz (116.9 kg)   Fetal Heart Rate (bpm): 170  Uterus:     Pelvic Exam: Perineum: no hemorrhoids, normal perineum   Vulva: normal external genitalia, no lesions   Vagina:  normal mucosa, normal discharge   Cervix: no lesions and normal, pap smear done.    Adnexa: normal adnexa and no mass, fullness, tenderness   Bony Pelvis: average  System: General: well-developed, well-nourished female in no acute distress   Breast:  normal appearance, no masses or tenderness   Skin: normal coloration and turgor, no rashes   Neurologic: oriented, normal, negative, normal mood   Extremities: normal strength, tone, and muscle mass, ROM of all joints is normal   HEENT PERRLA, extraocular movement intact and sclera clear, anicteric   Mouth/Teeth mucous membranes moist, pharynx normal without lesions and dental hygiene good   Neck supple and no masses   Cardiovascular: regular rate and rhythm   Respiratory:  no respiratory distress, normal breath sounds   Abdomen: soft, non-tender; bowel sounds normal; no masses,  no organomegaly     Assessment:   Pregnancy: G2P1001 Patient Active Problem List   Diagnosis Date Noted  . Supervision of other normal pregnancy, antepartum 04/08/2019  . S/P cesarean section for arrest of descent 01/13/2016  . Maternal varicella, non-immune 01/12/2016     Plan:  1. Supervision of other normal pregnancy, antepartum --Pt denies cramping, LOF, or vaginal bleeding - Obstetric Panel, Including HIV - Culture, OB Urine - Genetic Screening - US MFM OB COMP + 14 WK; Future  2. Obesity affecting pregnancy in first trimester - Hemoglobin A1c - aspirin EC 81 MG tablet; Take 1 tablet (81 mg total) by mouth daily.  Dispense: 30 tablet; Refill: 5  3. History of cesarean section complicating pregnancy --Desires repeat. Briefly discussed  VBAC vs repeat CS with pt.  Pt would like to schedule repeat.   4. Gastroesophageal reflux disease with esophagitis without hemorrhage --Pt treated by PCP for reflux with daily PPI, pt unsure which one. She still reports feeling like something is stuck in her throat. Thyroid labs and [place] have been normal.  PCP wants to schedule barium swallow with X-ray but pt decided to wait due to pregnancy. --This may all be acid reflux so will treat with BID PPI to see if symptoms improve.Will follow up if not improving. - pantoprazole (PROTONIX) 20 MG tablet; Take 1 tablet (20 mg total) by mouth 2 (two) times daily.  Dispense: 60 tablet; Refill: 5  5. Nausea and vomiting during pregnancy prior to [redacted] weeks gestation --Pt with nausea most days but little vomiting.  Is 12 weeks and nausea is already improving but pt would like some help on worst days.  Will add Reglan with meals PRN. - metoCLOPramide (REGLAN) 10 MG tablet; Take 1 tablet (10 mg total) by mouth 3 (three) times daily with meals as needed for nausea.  Dispense: 90 tablet; Refill: 2  6. Breast pain during pregnancy --Pt feeling "pins and needles" sharp pain in bilateral breasts since pregnancy started. She requested breast exam today which is wnl.  Reassurance provided that breasts are changing due to pregnancy in preparation for breastfeeding. Pt did not have these symptoms in first pregnancy so is concerned.  Pt to try heat/ice/Tylenol and if not improving by next visit in 4 weeks, will consider further work up such as imaging.    Initial labs drawn. Continue prenatal vitamins. Discussed and offered genetic screening options, including Quad screen/AFP, NIPS testing, and option to decline testing. Benefits/risks/alternatives reviewed. Pt aware that anatomy US is form of genetic screening with lower accuracy in detecting trisomies than blood work.  Pt chooses genetic screening today. NIPS: ordered. Ultrasound discussed; fetal anatomic survey:  requested. Problem list reviewed and updated. The nature of [place] with multiple MDs and other Advanced Practice Providers was explained to patient; also emphasized that residents, students are part of our team. Routine obstetric precautions reviewed. Return in about 4 weeks (around 06/09/2019).   Fatima Blank, CNM 05/12/19 1:18 PM

## 2019-05-13 LAB — HEMOGLOBIN A1C
Est. average glucose Bld gHb Est-mCnc: 108 mg/dL
Hgb A1c MFr Bld: 5.4 % (ref 4.8–5.6)

## 2019-05-13 LAB — OBSTETRIC PANEL, INCLUDING HIV
Antibody Screen: NEGATIVE
Basophils Absolute: 0 10*3/uL (ref 0.0–0.2)
Basos: 0 %
EOS (ABSOLUTE): 0.1 10*3/uL (ref 0.0–0.4)
Eos: 1 %
HIV Screen 4th Generation wRfx: NONREACTIVE
Hematocrit: 39.3 % (ref 34.0–46.6)
Hemoglobin: 13.1 g/dL (ref 11.1–15.9)
Hepatitis B Surface Ag: NEGATIVE
Immature Grans (Abs): 0 10*3/uL (ref 0.0–0.1)
Immature Granulocytes: 0 %
Lymphocytes Absolute: 2.2 10*3/uL (ref 0.7–3.1)
Lymphs: 27 %
MCH: 29.5 pg (ref 26.6–33.0)
MCHC: 33.3 g/dL (ref 31.5–35.7)
MCV: 89 fL (ref 79–97)
Monocytes Absolute: 0.4 10*3/uL (ref 0.1–0.9)
Monocytes: 5 %
Neutrophils Absolute: 5.4 10*3/uL (ref 1.4–7.0)
Neutrophils: 67 %
Platelets: 201 10*3/uL (ref 150–450)
RBC: 4.44 x10E6/uL (ref 3.77–5.28)
RDW: 12.9 % (ref 11.7–15.4)
RPR Ser Ql: NONREACTIVE
Rh Factor: POSITIVE
Rubella Antibodies, IGG: 1.01 index (ref >0.99)
WBC: 8.1 10*3/uL (ref 3.4–10.8)

## 2019-05-14 LAB — URINE CULTURE, OB REFLEX

## 2019-05-14 LAB — CULTURE, OB URINE

## 2019-05-15 ENCOUNTER — Other Ambulatory Visit: Payer: Self-pay | Admitting: Family Medicine

## 2019-05-15 DIAGNOSIS — O99211 Obesity complicating pregnancy, first trimester: Secondary | ICD-10-CM

## 2019-05-15 MED ORDER — ASPIRIN EC 81 MG PO TBEC: 81.0000 mg | DELAYED_RELEASE_TABLET | Freq: Every day | ORAL | 5 refills | Status: DC

## 2019-05-18 ENCOUNTER — Telehealth: Payer: Self-pay

## 2019-05-18 NOTE — Telephone Encounter (Signed)
Pt called after hours line and reported some dizziness over the weekend. Called pt back using interpreter services, pt did not answer, VM left using interpreter for pt to call back.

## 2019-05-25 ENCOUNTER — Encounter: Payer: Self-pay | Admitting: Advanced Practice Midwife

## 2019-05-28 ENCOUNTER — Encounter: Payer: Self-pay | Admitting: Advanced Practice Midwife

## 2019-05-29 ENCOUNTER — Other Ambulatory Visit: Payer: Self-pay | Admitting: Advanced Practice Midwife

## 2019-05-29 ENCOUNTER — Encounter: Payer: Self-pay | Admitting: Advanced Practice Midwife

## 2019-05-29 DIAGNOSIS — D563 Thalassemia minor: Secondary | ICD-10-CM | POA: Insufficient documentation

## 2019-05-29 HISTORY — DX: Thalassemia minor: D56.3

## 2019-06-01 ENCOUNTER — Other Ambulatory Visit: Payer: Self-pay

## 2019-06-01 ENCOUNTER — Telehealth: Payer: Self-pay

## 2019-06-01 DIAGNOSIS — N644 Mastodynia: Secondary | ICD-10-CM

## 2019-06-01 NOTE — Telephone Encounter (Signed)
Returned call to advise that referral for breast U/S was ordered per provider, no answer, left vm and notified scheduler.

## 2019-06-01 NOTE — Telephone Encounter (Signed)
S/w pt via interpreter and patient reported breast pain that has been an issue before pregnancy. Pt stated that at last visit, provider advised her to let her know if pain continued. Pt also stated that she had an appt to see a specialist about the pain on 05-29-19 that was referred by her previous GYN provider, but she canceled. Advised I would have to send provider a message and follow up.

## 2019-06-02 ENCOUNTER — Ambulatory Visit
Admission: RE | Admit: 2019-06-02 | Discharge: 2019-06-02 | Disposition: A | Discharge status: Home / Self Care | Payer: Medicaid Other | Source: Ambulatory Visit | Attending: Advanced Practice Midwife | Admitting: Advanced Practice Midwife

## 2019-06-02 ENCOUNTER — Other Ambulatory Visit: Payer: Self-pay

## 2019-06-02 ENCOUNTER — Other Ambulatory Visit: Payer: Self-pay | Admitting: Obstetrics

## 2019-06-02 DIAGNOSIS — N644 Mastodynia: Secondary | ICD-10-CM

## 2019-06-09 ENCOUNTER — Ambulatory Visit (INDEPENDENT_AMBULATORY_CARE_PROVIDER_SITE_OTHER): Payer: Medicaid Other | Admitting: Advanced Practice Midwife

## 2019-06-09 ENCOUNTER — Other Ambulatory Visit: Payer: Self-pay

## 2019-06-09 ENCOUNTER — Encounter: Payer: Self-pay | Admitting: Advanced Practice Midwife

## 2019-06-09 VITALS — BP 126/77 | HR 93 | Wt 255.9 lb

## 2019-06-09 DIAGNOSIS — M542 Cervicalgia: Secondary | ICD-10-CM

## 2019-06-09 DIAGNOSIS — Z3480 Encounter for supervision of other normal pregnancy, unspecified trimester: Secondary | ICD-10-CM

## 2019-06-09 DIAGNOSIS — O99212 Obesity complicating pregnancy, second trimester: Secondary | ICD-10-CM

## 2019-06-09 DIAGNOSIS — O09292 Supervision of pregnancy with other poor reproductive or obstetric history, second trimester: Secondary | ICD-10-CM

## 2019-06-09 DIAGNOSIS — L739 Follicular disorder, unspecified: Secondary | ICD-10-CM

## 2019-06-09 DIAGNOSIS — Z3A16 16 weeks gestation of pregnancy: Secondary | ICD-10-CM

## 2019-06-09 NOTE — Patient Instructions (Signed)
Segundo trimestre de embarazo Second Trimester of Pregnancy El segundo trimestre va desde la semana14 hasta la 27, desde el cuarto hasta el sexto mes, y suele ser el momento en el que mejor se siente. Su organismo se ha adaptado a estar embarazada, y comienza a sentirse fsicamente mejor. En general, las nuseas matutinas han disminuido o han desaparecido completamente, puede tener ms energa y un aumento de apetito. El segundo trimestre es tambin la poca en la que el feto se desarrolla rpidamente. Hacia el final del sexto mes, el feto mide aproximadamente 9pulgadas (23cm) y pesa alrededor de 1 libras (700g). Es probable que sienta que el beb se mueve (da pataditas) entre las 16 y 20semanas del embarazo. Cambios en el cuerpo durante el segundo trimestre Su cuerpo continua experimentando numerosos cambios durante su segundo trimestre. Estos cambios varan de una mujer a otra.  Seguir aumentando de peso. Notar que la parte baja del abdomen sobresale.  Podrn aparecer las primeras estras en las caderas, el abdomen y las mamas.  Es posible que tenga dolores de cabeza que pueden aliviarse con ciertos medicamentos. Los medicamentos que tome deben estar aprobados por el mdico.  Tal vez tenga necesidad de orinar con ms frecuencia porque el feto est ejerciendo presin sobre la vejiga.  Debido al embarazo podr sentir acidez estomacal con frecuencia.  Puede estar estreida, ya que ciertas hormonas enlentecen los movimientos de los msculos que empujan los desechos a travs de los intestinos.  Pueden aparecer hemorroides o abultarse e hincharse las venas (venas varicosas).  Puede sentir dolor en la espalda. Esto se debe a: ? Aumento de peso. ? Las hormonas del embarazo relajan las articulaciones en la pelvis. ? Un cambio en el peso y los msculos que ayudan a mantener su equilibrio.  Sus pechos seguirn creciendo y se pondrn cada vez ms sensibles.  Las encas pueden sangrar y estar  sensibles al cepillado y al hilo dental.  Pueden aparecer zonas oscuras o manchas (cloasma, mscara del embarazo) en el rostro. Esto probablemente se atenuar despus del nacimiento del beb.  Es posible que se forme una lnea oscura desde el ombligo hasta la zona del pubis (linea nigra). Esto probablemente se atenuar despus del nacimiento del beb.  Tal vez haya cambios en el cabello. Esto cambios pueden incluir su engrosamiento, crecimiento rpido y cambios en la textura. Adems, a algunas mujeres se les cae el cabello durante o despus del embarazo, o tienen el cabello seco o fino. Lo ms probable es que el cabello se le normalice despus del nacimiento del beb. Qu debe esperar en las visitas prenatales Durante una visita prenatal de rutina:  La pesarn para asegurarse de que usted y el feto estn creciendo normalmente.  Le tomarn la presin arterial.  Le medirn el abdomen para controlar el desarrollo del beb.  Se escucharn los latidos cardacos fetales.  Se evaluarn los resultados de los estudios solicitados en visitas anteriores. El mdico puede preguntarle lo siguiente:  Cmo se siente.  Si siente los movimientos del beb.  Si ha tenido sntomas anormales, como prdida de lquido, sangrado, dolores de cabeza intensos o clicos abdominales.  Si est consumiendo algn producto que contenga tabaco, como cigarrillos, tabaco de mascar y cigarrillos electrnicos.  Si tiene alguna pregunta. Otros estudios que podrn realizarse durante el segundo trimestre incluyen lo siguiente:  Anlisis de sangre para detectar lo siguiente: ? Concentraciones de hierro bajas (anemia). ? Nivel alto de azcar en la sangre que afecta a las mujeres embarazadas (diabetes   gestacional) entre las semanas 24 y 28. ? Anticuerpos Rh. Esto es para detectar una protena en los glbulos rojos (factor Rh).  Anlisis de orina para detectar infecciones, diabetes o protenas en la orina.  Una ecografa  para confirmar que el beb crece y se desarrolla correctamente.  Una amniocentesis para diagnosticar posibles problemas genticos.  Estudios del feto para descartar espina bfida y sndrome de Down.  Prueba del VIH (virus de inmunodeficiencia humana). Los exmenes prenatales de rutina incluyen la prueba de deteccin del VIH, a menos que decida no realizrsela. Siga estas indicaciones en su casa: Medicamentos  Siga las indicaciones del mdico en relacin con el uso de medicamentos. Durante el embarazo, hay medicamentos que pueden tomarse y otros que no.  Tome vitaminas prenatales que contengan por lo menos 600microgramos (?g) de cido flico.  Si est estreida, tome un laxante suave, si el mdico lo autoriza. Qu debe comer y beber   Lleve una dieta equilibrada que incluya gran cantidad de frutas y verduras frescas, cereales integrales, buenas fuentes de protenas como carnes magras, huevos o tofu, y lcteos descremados. El mdico la ayudar a determinar la cantidad de peso que puede aumentar.  No coma carne cruda ni quesos sin cocinar. Estos elementos contienen grmenes que pueden causar defectos congnitos en el beb.  Si no consume muchos alimentos con calcio, hable con su mdico sobre si debera tomar un suplemento diario de calcio.  Limite el consumo de alimentos con alto contenido de grasas y azcares procesados, como alimentos fritos o dulces.  Para evitar el estreimiento: ? Bebe suficiente lquido para mantener la orina clara o de color amarillo plido. ? Consuma alimentos ricos en fibra, como frutas y verduras frescas, cereales integrales y frijoles. Actividad  Haga ejercicio solamente como se lo haya indicado el mdico. La mayora de las mujeres pueden continuar su rutina de ejercicios durante el embarazo. Intente realizar como mnimo 30minutos de actividad fsica por lo menos 5das a la semana. Deje de hacer ejercicio si experimenta contracciones uterinas.  No levante  objetos pesados, use zapatos de tacones bajos y mantenga una buena postura.  Puede seguir manteniendo relaciones sexuales, a menos que el mdico le indique lo contrario. Alivio del dolor y del malestar  Use un sostn que le brinde buen soporte para prevenir las molestias causadas por la sensibilidad en los pechos.  Dese baos de asiento con agua tibia para aliviar el dolor o las molestias causadas por las hemorroides. Use una crema para las hemorroides si el mdico la autoriza.  Descanse con las piernas elevadas si tiene calambres o dolor de cintura.  Si tiene venas varicosas, use medias de descanso. Eleve los pies durante 15minutos, 3 o 4veces por da. Limite el consumo de sal en su dieta. Cuidados prenatales  Escriba sus preguntas. Llvelas cuando concurra a las visitas prenatales.  Concurra a todas las visitas prenatales tal como se lo haya indicado el mdico. Esto es importante. Seguridad  Use el cinturn de seguridad en todo momento mientras conduce.  Haga una lista de los nmeros de telfono de emergencia, que incluya los nmeros de telfono de familiares, amigos, el hospital y los departamentos de polica y bomberos. Instrucciones generales  Pdale al mdico que la derive a clases de educacin prenatal en su localidad. Debe comenzar a tomar las clases antes de que empiece el mes6 de embarazo.  Pida ayuda si tiene necesidades nutricionales o de asesoramiento durante el embarazo. El mdico puede aconsejarla o derivarla a especialistas para que la   ayuden con diferentes necesidades.  No se d baos de inmersin en agua caliente, baos turcos ni saunas.  No se haga duchas vaginales ni use tampones o toallas higinicas perfumadas.  No mantenga las piernas cruzadas durante mucho tiempo.  Evite el contacto con las bandejas sanitarias de los gatos y la tierra que estos animales usan. Estos elementos contienen bacterias que pueden causar defectos congnitos al beb y la posible  prdida del feto debido a un aborto espontneo o muerte fetal.  Evite fumar, consumir hierbas, beber alcohol y tomar frmacos que no le hayan recetado. Las sustancias qumicas que estos productos contienen pueden afectar la formacin y el desarrollo del beb.  No consuma ningn producto que contenga nicotina o tabaco, como cigarrillos y cigarrillos electrnicos. Si necesita ayuda para dejar de fumar, consulte al mdico.  Visite a su dentista si an no lo ha hecho durante el embarazo. Use un cepillo de dientes blando para higienizarse los dientes y psese el hilo dental con suavidad. Comunquese con un mdico si:  Tiene mareos.  Siente clicos leves, presin en la pelvis o dolor persistente en el abdomen.  Tiene nuseas, vmitos o diarrea persistentes.  Observa una secrecin vaginal con mal olor.  Siente dolor al orinar. Solicite ayuda de inmediato si:  Tiene fiebre.  Tiene una prdida de lquido por la vagina.  Tiene sangrado o pequeas prdidas vaginales.  Siente dolor intenso o clicos en el abdomen.  Sube de peso o baja de peso rpidamente.  Tiene dificultad para respirar y siente dolor de pecho.  Sbitamente se le hinchan mucho el rostro, las manos, los tobillos, los pies o las piernas.  No ha sentido los movimientos del beb durante una hora.  Siente un dolor de cabeza intenso que no se alivia al tomar medicamentos.  Nota cambios en la visin. Resumen  El segundo trimestre va desde la semana14 hasta la 27, desde el cuarto hasta el sexto mes. Es tambin una poca en la que el feto se desarrolla rpidamente.  Su organismo atraviesa por muchos cambios durante el embarazo. Estos cambios varan de una mujer a otra.  Evite fumar, consumir hierbas, beber alcohol y tomar frmacos que no le hayan recetado. Estas sustancias qumicas afectan la formacin y el desarrollo de su beb.  No consuma ningn producto que contenga tabaco, lo que incluye cigarrillos, tabaco de mascar  y cigarrillos electrnicos. Si necesita ayuda para dejar de fumar, consulte al mdico.  Comunquese con su mdico si tiene preguntas sobre esto. Concurra a todas las visitas prenatales tal como se lo haya indicado el mdico. Esto es importante. Esta informacin no tiene como fin reemplazar el consejo del mdico. Asegrese de hacerle al mdico cualquier pregunta que tenga. Document Revised: 07/23/2016 Document Reviewed: 07/23/2016 Elsevier Patient Education  2020 [employer]  

## 2019-06-09 NOTE — Progress Notes (Signed)
   PRENATAL VISIT NOTE  Subjective:  Lisa Herrera is a 24 y.o. G2P1001 at 78w0dbeing seen today for ongoing prenatal care.  She is currently monitored for the following issues for this low-risk pregnancy and has Maternal varicella, non-immune; S/P cesarean section for arrest of descent; Supervision of other normal pregnancy, antepartum; Nausea and vomiting during pregnancy prior to [redacted] weeks gestation; Gastroesophageal reflux disease with esophagitis without hemorrhage; Obesity affecting pregnancy in first trimester; Hx of cholelithiasis; and Alpha thalassemia silent carrier on their problem list.  Patient reports neck pain and bumps in vaginal area.  Contractions: Not present. Vag. Bleeding: None.  Movement: Absent. Denies leaking of fluid.   The following portions of the patient's history were reviewed and updated as appropriate: allergies, current medications, past family history, past medical history, past social history, past surgical history and problem list.   Objective:   Vitals:   06/09/19 0915  BP: 126/77  Pulse: 93  Weight: 255 lb 14.4 oz (116.1 kg)    Fetal Status: Fetal Heart Rate (bpm): 158   Movement: Absent     General:  Alert, oriented and cooperative. Patient is in no acute distress.  Skin: Skin is warm and dry. No rash noted.   Cardiovascular: Normal heart rate noted  Respiratory: Normal respiratory effort, no problems with respiration noted  Abdomen: Soft, gravid, appropriate for gestational age.  Pain/Pressure: Absent     Pelvic: Cervical exam deferred        Extremities: Normal range of motion.  Edema: None  Mental Status: Normal mood and affect. Normal behavior. Normal judgment and thought content.   Assessment and Plan:  Pregnancy: G2P1001 at 153w0d. Supervision of other normal pregnancy, antepartum --Anticipatory guidance about next visits/weeks of pregnancy given. --Next visit in 4 weeks, virtual --Anatomy [place]/7/21 - AFP, Serum, Open Spina Bifida  2.  Neck pain on left side --Pt had workup with PCP including [place] to report pain and feeling pressure in left side of her neck.  No problems with eating/drinking/swallowing.   - Ambulatory referral to ENT  3. Obesity affecting pregnancy in second trimester --On Baby ASA daily.  Baseline PEC labs today. - CBC - Comp Met (CMET) - Protein / creatinine ratio, urine  5. Acute folliculitis --Pt reports bumps on left labia, on exam, no bumps noted.  Pt reports her husband popped one of them yesterday, she showered and washed the area, and put antibiotic ointment on them.  She is unable to feel the bumps during the exam today. --Most likely folliculitis given the location at the base of the hair follicle and quick healing after drainage.  --If they return, warm compresses, Epsom salt baths, come to office if large and will not drain.  Preterm labor symptoms and general obstetric precautions including but not limited to vaginal bleeding, contractions, leaking of fluid and fetal movement were reviewed in detail with the patient. Please refer to After Visit Summary for other counseling recommendations.   Return in about 4 weeks (around 07/07/2019).  Future Appointments  Date Time Provider DeBernice4/09/2019 10:15 AM WH-MFC [place] WH-MFCUS MFC-US  07/01/2019  1:00 PM WHE[facility]ET[facility] COUNSELING [facility] [place]    LiFatima BlankCNM

## 2019-06-09 NOTE — Progress Notes (Signed)
Pt presents for ROB with Spanish interpreter c/o painful labia bumps Pt also c/o L side neck pain AFP due today

## 2019-06-11 LAB — COMPREHENSIVE METABOLIC PANEL
ALT: 14 IU/L (ref 0–32)
AST: 12 IU/L (ref 0–40)
Albumin/Globulin Ratio: 1.3 (ref 1.2–2.2)
Albumin: 3.7 g/dL — ABNORMAL LOW (ref 3.9–5.0)
Alkaline Phosphatase: 73 IU/L (ref 39–117)
BUN/Creatinine Ratio: 12 (ref 9–23)
BUN: 5 mg/dL — ABNORMAL LOW (ref 6–20)
Bilirubin Total: <0.2 mg/dL (ref 0.0–1.2)
CO2: 19 mmol/L — ABNORMAL LOW (ref 20–29)
Calcium: 9 mg/dL (ref 8.7–10.2)
Chloride: 104 mmol/L (ref 96–106)
Creatinine, Ser: 0.43 mg/dL — ABNORMAL LOW (ref 0.57–1.00)
GFR calc Af Amer: 162 mL/min/{1.73_m2} (ref >59)
GFR calc non Af Amer: 141 mL/min/{1.73_m2} (ref >59)
Globulin, Total: 2.8 g/dL (ref 1.5–4.5)
Glucose: 95 mg/dL (ref 65–99)
Potassium: 3.7 mmol/L (ref 3.5–5.2)
Sodium: 137 mmol/L (ref 134–144)
Total Protein: 6.5 g/dL (ref 6.0–8.5)

## 2019-06-11 LAB — CBC
Hematocrit: 39.9 % (ref 34.0–46.6)
Hemoglobin: 13.1 g/dL (ref 11.1–15.9)
MCH: 28.4 pg (ref 26.6–33.0)
MCHC: 32.8 g/dL (ref 31.5–35.7)
MCV: 87 fL (ref 79–97)
Platelets: 212 10*3/uL (ref 150–450)
RBC: 4.61 x10E6/uL (ref 3.77–5.28)
RDW: 12.6 % (ref 11.7–15.4)
WBC: 8.6 10*3/uL (ref 3.4–10.8)

## 2019-06-11 LAB — AFP, SERUM, OPEN SPINA BIFIDA
AFP MoM: 2.7
AFP Value: 65.5 ng/mL
Gest. Age on Collection Date: 16 weeks
Maternal Age At EDD: 26.5 yr
OSBR Risk 1 IN: 198
Test Results:: POSITIVE — AB
Weight: 255 [lb_av]

## 2019-06-11 LAB — PROTEIN / CREATININE RATIO, URINE
Creatinine, Urine: 221.8 mg/dL
Protein, Ur: 21.9 mg/dL
Protein/Creat Ratio: 99 mg/g creat (ref 0–200)

## 2019-06-15 ENCOUNTER — Other Ambulatory Visit: Payer: Self-pay

## 2019-06-15 ENCOUNTER — Ambulatory Visit (INDEPENDENT_AMBULATORY_CARE_PROVIDER_SITE_OTHER): Payer: Medicaid Other | Admitting: Obstetrics & Gynecology

## 2019-06-15 VITALS — BP 107/71 | HR 97 | Wt 257.9 lb

## 2019-06-15 DIAGNOSIS — O280 Abnormal hematological finding on antenatal screening of mother: Secondary | ICD-10-CM

## 2019-06-15 DIAGNOSIS — Z3480 Encounter for supervision of other normal pregnancy, unspecified trimester: Secondary | ICD-10-CM

## 2019-06-15 DIAGNOSIS — N9089 Other specified noninflammatory disorders of vulva and perineum: Secondary | ICD-10-CM

## 2019-06-15 DIAGNOSIS — O3472 Maternal care for abnormality of vulva and perineum, second trimester: Secondary | ICD-10-CM

## 2019-06-15 DIAGNOSIS — Z603 Acculturation difficulty: Secondary | ICD-10-CM

## 2019-06-15 DIAGNOSIS — Z3A16 16 weeks gestation of pregnancy: Secondary | ICD-10-CM

## 2019-06-15 MED ORDER — BLOOD PRESSURE KIT DEVI: 1.0000 | 0 refills | Status: DC

## 2019-06-15 NOTE — Patient Instructions (Signed)
Return to office for any scheduled appointments. Call the office or go to the MAU at [facility] [place] if:  You begin to have strong, frequent contractions  Your water breaks.  Sometimes it is a big gush of fluid, sometimes it is just a trickle that keeps getting your panties wet or running down your legs  You have vaginal bleeding.  It is normal to have a small amount of spotting if your cervix was checked.   You do not feel your baby moving like normal.  If you do not, get something to eat and drink and lay down and focus on feeling your baby move.   If your baby is still not moving like normal, you should call the office or go to MAU.  Any other obstetric concerns.    Second Trimester of Pregnancy The second trimester is from week 14 through week 27 (months 4 through 6). The second trimester is often a time when you feel your best. Your body has adjusted to being pregnant, and you begin to feel better physically. Usually, morning sickness has lessened or quit completely, you may have more energy, and you may have an increase in appetite. The second trimester is also a time when the fetus is growing rapidly. At the end of the sixth month, the fetus is about 9 inches long and weighs about 1 pounds. You will likely begin to feel the baby move (quickening) between 16 and 20 weeks of pregnancy. Body changes during your second trimester Your body continues to go through many changes during your second trimester. The changes vary from woman to woman.  Your weight will continue to increase. You will notice your lower abdomen bulging out.  You may begin to get stretch marks on your hips, abdomen, and breasts.  You may develop headaches that can be relieved by medicines. The medicines should be approved by your health care provider.  You may urinate more often because the fetus is pressing on your bladder.  You may develop or continue to have heartburn as a result of your  pregnancy.  You may develop constipation because certain hormones are causing the muscles that push waste through your intestines to slow down.  You may develop hemorrhoids or swollen, bulging veins (varicose veins).  You may have back pain. This is caused by: ? Weight gain. ? Pregnancy hormones that are relaxing the joints in your pelvis. ? A shift in weight and the muscles that support your balance.  Your breasts will continue to grow and they will continue to become tender.  Your gums may bleed and may be sensitive to brushing and flossing.  Dark spots or blotches (chloasma, mask of pregnancy) may develop on your face. This will likely fade after the baby is born.  A dark line from your belly button to the pubic area (linea nigra) may appear. This will likely fade after the baby is born.  You may have changes in your hair. These can include thickening of your hair, rapid growth, and changes in texture. Some women also have hair loss during or after pregnancy, or hair that feels dry or thin. Your hair will most likely return to normal after your baby is born. What to expect at prenatal visits During a routine prenatal visit:  You will be weighed to make sure you and the fetus are growing normally.  Your blood pressure will be taken.  Your abdomen will be measured to track your baby's growth.    The fetal heartbeat will be listened to.  Any test results from the previous visit will be discussed. Your health care provider may ask you:  How you are feeling.  If you are feeling the baby move.  If you have had any abnormal symptoms, such as leaking fluid, bleeding, severe headaches, or abdominal cramping.  If you are using any tobacco products, including cigarettes, chewing tobacco, and electronic cigarettes.  If you have any questions. Other tests that may be performed during your second trimester include:  Blood tests that check for: ? Low iron levels (anemia). ? High  blood sugar that affects pregnant women (gestational diabetes) between 24 and 28 weeks. ? Rh antibodies. This is to check for a protein on red blood cells (Rh factor).  Urine tests to check for infections, diabetes, or protein in the urine.  An ultrasound to confirm the proper growth and development of the baby.  An amniocentesis to check for possible genetic problems.  Fetal screens for spina bifida and Down syndrome.  HIV (human immunodeficiency virus) testing. Routine prenatal testing includes screening for HIV, unless you choose not to have this test. Follow these instructions at home: Medicines  Follow your health care provider's instructions regarding medicine use. Specific medicines may be either safe or unsafe to take during pregnancy.  Take a prenatal vitamin that contains at least 600 micrograms (mcg) of folic acid.  If you develop constipation, try taking a stool softener if your health care provider approves. Eating and drinking   Eat a balanced diet that includes fresh fruits and vegetables, whole grains, good sources of protein such as meat, eggs, or tofu, and low-fat dairy. Your health care provider will help you determine the amount of weight gain that is right for you.  Avoid raw meat and uncooked cheese. These carry germs that can cause birth defects in the baby.  If you have low calcium intake from food, talk to your health care provider about whether you should take a daily calcium supplement.  Limit foods that are high in fat and processed sugars, such as fried and sweet foods.  To prevent constipation: ? Drink enough fluid to keep your urine clear or pale yellow. ? Eat foods that are high in fiber, such as fresh fruits and vegetables, whole grains, and beans. Activity  Exercise only as directed by your health care provider. Most women can continue their usual exercise routine during pregnancy. Try to exercise for 30 minutes at least 5 days a week. Stop  exercising if you experience uterine contractions.  Avoid heavy lifting, wear low heel shoes, and practice good posture.  A sexual relationship may be continued unless your health care provider directs you otherwise. Relieving pain and discomfort  Wear a good support bra to prevent discomfort from breast tenderness.  Take warm sitz baths to soothe any pain or discomfort caused by hemorrhoids. Use hemorrhoid cream if your health care provider approves.  Rest with your legs elevated if you have leg cramps or low back pain.  If you develop varicose veins, wear support hose. Elevate your feet for 15 minutes, 3-4 times a day. Limit salt in your diet. Prenatal Care  Write down your questions. Take them to your prenatal visits.  Keep all your prenatal visits as told by your health care provider. This is important. Safety  Wear your seat belt at all times when driving.  Make a list of emergency phone numbers, including numbers for family, friends, the hospital, and police and   fire departments. General instructions  Ask your health care provider for a referral to a local prenatal education class. Begin classes no later than the beginning of month 6 of your pregnancy.  Ask for help if you have counseling or nutritional needs during pregnancy. Your health care provider can offer advice or refer you to specialists for help with various needs.  Do not use hot tubs, steam rooms, or saunas.  Do not douche or use tampons or scented sanitary pads.  Do not cross your legs for long periods of time.  Avoid cat litter boxes and soil used by cats. These carry germs that can cause birth defects in the baby and possibly loss of the fetus by miscarriage or stillbirth.  Avoid all smoking, herbs, alcohol, and unprescribed drugs. Chemicals in these products can affect the formation and growth of the baby.  Do not use any products that contain nicotine or tobacco, such as cigarettes and e-cigarettes. If  you need help quitting, ask your health care provider.  Visit your dentist if you have not gone yet during your pregnancy. Use a soft toothbrush to brush your teeth and be gentle when you floss. Contact a health care provider if:  You have dizziness.  You have mild pelvic cramps, pelvic pressure, or nagging pain in the abdominal area.  You have persistent nausea, vomiting, or diarrhea.  You have a bad smelling vaginal discharge.  You have pain when you urinate. Get help right away if:  You have a fever.  You are leaking fluid from your vagina.  You have spotting or bleeding from your vagina.  You have severe abdominal cramping or pain.  You have rapid weight gain or weight loss.  You have shortness of breath with chest pain.  You notice sudden or extreme swelling of your face, hands, ankles, feet, or legs.  You have not felt your baby move in over an hour.  You have severe headaches that do not go away when you take medicine.  You have vision changes. Summary  The second trimester is from week 14 through week 27 (months 4 through 6). It is also a time when the fetus is growing rapidly.  Your body goes through many changes during pregnancy. The changes vary from woman to woman.  Avoid all smoking, herbs, alcohol, and unprescribed drugs. These chemicals affect the formation and growth your baby.  Do not use any tobacco products, such as cigarettes, chewing tobacco, and e-cigarettes. If you need help quitting, ask your health care provider.  Contact your health care provider if you have any questions. Keep all prenatal visits as told by your health care provider. This is important. This information is not intended to replace advice given to you by your health care provider. Make sure you discuss any questions you have with your health care provider. Document Revised: 07/04/2018 Document Reviewed: 04/17/2016 Elsevier Patient Education  2020 [employer] Inc.  

## 2019-06-15 NOTE — Progress Notes (Signed)
ROB.  C/o painful bumps on vagina 6-8/10 x 2 weeks.  BP Cuff reordered 06/15/19

## 2019-06-15 NOTE — Progress Notes (Deleted)
   PRENATAL VISIT NOTE  Subjective:  Lisa Herrera is a 24 y.o. G2P1001 at [redacted]w[redacted]d being seen today for ongoing prenatal care.  She is currently monitored for the following issues for this {Blank single:19197::"high-risk","low-risk"} pregnancy and has Maternal varicella, non-immune; S/P cesarean section for arrest of descent; Supervision of other normal pregnancy, antepartum; Nausea and vomiting during pregnancy prior to [redacted] weeks gestation; Gastroesophageal reflux disease with esophagitis without hemorrhage; Obesity affecting pregnancy in first trimester; Hx of cholelithiasis; and Alpha thalassemia silent carrier on their problem list.  Patient reports {sx:14538}.  Contractions: Not present. Vag. Bleeding: None.   . Denies leaking of fluid.   The following portions of the patient's history were reviewed and updated as appropriate: allergies, current medications, past family history, past medical history, past social history, past surgical history and problem list.   Objective:   Vitals:   06/15/19 1415  BP: 107/71  Pulse: 97  Weight: 257 lb 14.4 oz (117 kg)    Fetal Status:           General:  Alert, oriented and cooperative. Patient is in no acute distress.  Skin: Skin is warm and dry. No rash noted.   Cardiovascular: Normal heart rate noted  Respiratory: Normal respiratory effort, no problems with respiration noted  Abdomen: Soft, gravid, appropriate for gestational age.  Pain/Pressure: Present     Pelvic: {Blank single:19197::"Cervical exam performed in the presence of a chaperone","Cervical exam deferred"}        Extremities: Normal range of motion.  Edema: None  Mental Status: Normal mood and affect. Normal behavior. Normal judgment and thought content.   Assessment and Plan:  Pregnancy: G2P1001 at [redacted]w[redacted]d 1. Vulvar lesion *** - Herpes simplex virus culture  2. Abnormal antenatal AFP screen ***  3. Supervision of other normal pregnancy, antepartum ***  {Blank  single:19197::"Term","Preterm"} labor symptoms and general obstetric precautions including but not limited to vaginal bleeding, contractions, leaking of fluid and fetal movement were reviewed in detail with the patient. Please refer to After Visit Summary for other counseling recommendations.   Return in about 4 weeks (around 07/13/2019) for OFFICE OB Visit.  Future Appointments  Date Time Provider Department Center  07/01/2019 10:15 AM [facility] [place] 4 [facility]  07/01/2019  1:00 PM [facility] GENETIC COUNSELING [facility]  07/07/2019  9:15 AM Brock Bad, MD [facility] None    Jaynie Collins, MD

## 2019-06-15 NOTE — Progress Notes (Signed)
   PRENATAL VISIT NOTE  Subjective:  Lisa Herrera is a 24 y.o. G2P1001 at [redacted]w[redacted]d being seen today for ongoing prenatal care. Patient is Spanish-speaking only, interpreter present for this encounter. She is currently monitored for the following issues for this low-risk pregnancy and has Maternal varicella, non-immune; S/P cesarean section for arrest of descent; Supervision of other normal pregnancy, antepartum; Nausea and vomiting during pregnancy prior to [redacted] weeks gestation; Gastroesophageal reflux disease with esophagitis without hemorrhage; Obesity affecting pregnancy in first trimester; Hx of cholelithiasis; and Alpha thalassemia silent carrier on their problem list.  Patient reports bumps on top of her vulva; had three bump and two have resolved. Only one small one remaining. No pain, no drainage.  Contractions: Not present. Vag. Bleeding: None.   . Denies leaking of fluid.   The following portions of the patient's history were reviewed and updated as appropriate: allergies, current medications, past family history, past medical history, past social history, past surgical history and problem list.   Objective:   Vitals:   06/15/19 1415  BP: 107/71  Pulse: 97  Weight: 257 lb 14.4 oz (117 kg)    Fetal Status:           General:  Alert, oriented and cooperative. Patient is in no acute distress.  Skin: Skin is warm and dry. No rash noted.   Cardiovascular: Normal heart rate noted  Respiratory: Normal respiratory effort, no problems with respiration noted  Abdomen: Soft, gravid, appropriate for gestational age.  Pain/Pressure: Present     Pelvic: Exam performed in the presence of a chaperon.  One tiny pimple noted on left side of mons pubis, no erythema, nontender       Extremities: Normal range of motion.  Edema: None  Mental Status: Normal mood and affect. Normal behavior. Normal judgment and thought content.   Assessment and Plan:  Pregnancy: G2P1001 at [redacted]w[redacted]d 1. Vulvar  lesion Reassured by benign finding.   2. Abnormal antenatal AFP screen Patient informed of result, had low risk NIPS. Will follow up anatomy scan results.  3. Supervision of other normal pregnancy, antepartum No other complaints or concerns.  Routine obstetric precautions reviewed. Please refer to After Visit Summary for other counseling recommendations.   Return in about 4 weeks (around 07/13/2019) for OFFICE OB Visit.  Future Appointments  Date Time Provider Department Center  07/01/2019 10:15 AM [facility] [place] 4 [facility]  07/01/2019  1:00 PM [facility] GENETIC COUNSELING [facility]  07/07/2019  9:15 AM Brock Bad, MD [facility] None    Jaynie Collins, MD

## 2019-06-17 ENCOUNTER — Telehealth: Payer: Self-pay | Admitting: Advanced Practice Midwife

## 2019-06-17 ENCOUNTER — Encounter (HOSPITAL_COMMUNITY): Payer: Self-pay

## 2019-06-17 ENCOUNTER — Ambulatory Visit (HOSPITAL_COMMUNITY)
Admission: EM | Admit: 2019-06-17 | Discharge: 2019-06-17 | Disposition: A | Discharge status: Home / Self Care | Payer: Medicaid Other | Attending: Internal Medicine | Admitting: Internal Medicine

## 2019-06-17 ENCOUNTER — Other Ambulatory Visit: Payer: Self-pay

## 2019-06-17 DIAGNOSIS — O99512 Diseases of the respiratory system complicating pregnancy, second trimester: Secondary | ICD-10-CM | POA: Diagnosis not present

## 2019-06-17 DIAGNOSIS — J069 Acute upper respiratory infection, unspecified: Secondary | ICD-10-CM | POA: Diagnosis not present

## 2019-06-17 DIAGNOSIS — J029 Acute pharyngitis, unspecified: Secondary | ICD-10-CM | POA: Diagnosis present

## 2019-06-17 DIAGNOSIS — Z79899 Other long term (current) drug therapy: Secondary | ICD-10-CM | POA: Diagnosis not present

## 2019-06-17 DIAGNOSIS — Z3A17 17 weeks gestation of pregnancy: Secondary | ICD-10-CM | POA: Diagnosis not present

## 2019-06-17 DIAGNOSIS — Z7982 Long term (current) use of aspirin: Secondary | ICD-10-CM | POA: Insufficient documentation

## 2019-06-17 DIAGNOSIS — Z20822 Contact with and (suspected) exposure to covid-19: Secondary | ICD-10-CM | POA: Diagnosis not present

## 2019-06-17 LAB — POCT RAPID STREP A: Streptococcus, Group A Screen (Direct): NEGATIVE

## 2019-06-17 MED ORDER — ACETAMINOPHEN 500 MG PO TABS: 500.0000 mg | ORAL_TABLET | Freq: Four times a day (QID) | ORAL | 0 refills | Status: DC | PRN

## 2019-06-17 MED ORDER — GUAIFENESIN 100 MG/5ML PO LIQD: 100.0000 mg | ORAL | 0 refills | Status: DC | PRN

## 2019-06-17 MED ORDER — CETIRIZINE HCL 10 MG PO TABS: 10.0000 mg | ORAL_TABLET | Freq: Every day | ORAL | 0 refills | Status: DC

## 2019-06-17 NOTE — ED Triage Notes (Signed)
Pt state she has a sore throat x 4 days. Pt states she [redacted] weeks pregnant.

## 2019-06-17 NOTE — ED Notes (Signed)
Spoke to patient at registration, reports sore throat, speaking in whispered tone, denies any airway involvement, confirms she is able to breathe without difficulty

## 2019-06-17 NOTE — Telephone Encounter (Signed)
Called patient with spanish interpreter to discuss abnormal AFP and follow up plan.  Anatomy US moved to tomorrow, 06/18/19, at 0730 am.  Discussed AFP as screening lab not diagnostic.  Ultrasound to look for NTD or other causes for abnormal screening lab.  Genetic counseling to follow [place]. Pt states understanding and will go to [place] appt with MFM tomorrow.

## 2019-06-17 NOTE — Discharge Instructions (Addendum)
Your strep test was negative This is most likely viral upper respiratory infection I am sending some medications to the pharmacy to help with your symptoms that are safe in pregnancy.  You can also do Chloraseptic throat spray or warm salt water gargles

## 2019-06-17 NOTE — ED Provider Notes (Signed)
[facility]    CSN: [id number] Arrival date & time: 06/17/19  1010      History   Chief Complaint Chief Complaint  Patient presents with  . Sore Throat    HPI Lisa Herrera is a 24 y.o. female.   Patient is a 24 year old female that presents today with sore throat, productive cough for the past 4 days.  Symptoms been constant. Green mucous.   She has not taken any medication for her symptoms.  Reporting she is approximately [redacted] weeks pregnant.  Denies any fever, chills, body aches, chest pain or shortness of breath.  Reported family has been sick with similar symptoms in the same household.  No known Covid exposure.  Saw ENT specialist last week for tonsillar swelling.  Was not treated for any type of infection at that time.  ROS per HPI      Past Medical History:  Diagnosis Date  . Gallstones   . Medical history non-contributory   . Migraine     Patient Active Problem List   Diagnosis Date Noted  . Alpha thalassemia silent carrier 05/29/2019  . Nausea and vomiting during pregnancy prior to [redacted] weeks gestation 05/12/2019  . Gastroesophageal reflux disease with esophagitis without hemorrhage 05/12/2019  . Obesity affecting pregnancy in first trimester 05/12/2019  . Hx of cholelithiasis 05/12/2019  . Supervision of other normal pregnancy, antepartum 04/08/2019  . S/P cesarean section for arrest of descent 01/13/2016  . Maternal varicella, non-immune 01/12/2016    Past Surgical History:  Procedure Laterality Date  . CESAREAN SECTION N/A 01/14/2016   Procedure: CESAREAN SECTION;  Surgeon: Osborne Oman, MD;  Location: [facility];  Service: Obstetrics;  Laterality: N/A;  . CHOLECYSTECTOMY    . NO PAST SURGERIES      OB History    Gravida  2   Para  1   Term  1   Preterm      AB      Living  1     SAB      TAB      Ectopic      Multiple  0   Live Births  1        Obstetric Comments  C/S due to breech baby           Home Medications    Prior to Admission medications   Medication Sig Start Date End Date Taking? Authorizing Provider  acetaminophen (TYLENOL) 500 MG tablet Take 1 tablet (500 mg total) by mouth every 6 (six) hours as needed. 06/17/19   Loura Halt A, NP  aspirin EC 81 MG tablet Take 1 tablet (81 mg total) by mouth daily. 05/15/19   Donnamae Jude, MD  Blood Pressure Monitoring (BLOOD PRESSURE KIT) DEVI 1 kit by Does not apply route once a week. Check Blood Pressure regularly and record readings into the [employer] App.  Large Cuff.  DX O90.0 06/15/19   Anyanwu, Sallyanne Havers, MD  cetirizine (ZYRTEC) 10 MG tablet Take 1 tablet (10 mg total) by mouth daily. 06/17/19   Loura Halt A, NP  guaiFENesin (ROBITUSSIN) 100 MG/5ML liquid Take 5-10 mLs (100-200 mg total) by mouth every 4 (four) hours as needed for cough. 06/17/19   Orvan July, NP  Prenatal Vit-Fe Fumarate-FA (PRENATAL MULTIVITAMIN) TABS tablet Take 1 tablet by mouth daily at 12 noon.    [provider]    Family History Family History  Problem Relation Age of Onset  . Breast cancer  Maternal Aunt        50's    Social History Social History   Tobacco Use  . Smoking status: Never Smoker  . Smokeless tobacco: Never Used  Substance Use Topics  . Alcohol use: No    Alcohol/week: 0.0 standard drinks  . Drug use: No     Allergies   Patient has no known allergies.   Review of Systems Review of Systems   Physical Exam Triage Vital Signs ED Triage Vitals  Enc Vitals Group     BP 06/17/19 1047 130/80     Pulse Rate 06/17/19 1047 92     Resp 06/17/19 1047 18     Temp 06/17/19 1047 98.4 F (36.9 C)     Temp Source 06/17/19 1047 Oral     SpO2 06/17/19 1047 97 %     Weight 06/17/19 1045 263 lb (119.3 kg)     Height --      Head Circumference --      Peak Flow --      Pain Score 06/17/19 1045 8     Pain Loc --      Pain Edu? --      Excl. in [place]? --    No data found.  Updated Vital Signs BP 130/80 (BP  Location: Right Arm)   Pulse 92   Temp 98.4 F (36.9 C) (Oral)   Resp 18   Wt 263 lb (119.3 kg)   LMP 02/17/2019   SpO2 97%   BMI 48.10 kg/m   Visual Acuity Right Eye Distance:   Left Eye Distance:   Bilateral Distance:    Right Eye Near:   Left Eye Near:    Bilateral Near:     Physical Exam Vitals and nursing note reviewed.  Constitutional:      General: She is not in acute distress.    Appearance: She is well-developed. She is not ill-appearing, toxic-appearing or diaphoretic.  HENT:     Head: Normocephalic and atraumatic.     Right Ear: Tympanic membrane and ear canal normal.     Left Ear: Tympanic membrane and ear canal normal.     Nose: Congestion present.     Mouth/Throat:     Pharynx: Uvula midline. No posterior oropharyngeal erythema.     Tonsils: No tonsillar exudate or tonsillar abscesses. 2+ on the right. 2+ on the left.  Eyes:     Conjunctiva/sclera: Conjunctivae normal.  Cardiovascular:     Rate and Rhythm: Normal rate and regular rhythm.  Pulmonary:     Effort: Pulmonary effort is normal.     Breath sounds: Normal breath sounds.  Musculoskeletal:     Cervical back: Normal range of motion.  Skin:    General: Skin is warm and dry.  Neurological:     Mental Status: She is alert.  Psychiatric:        Mood and Affect: Mood normal.      UC Treatments / Results  Labs (all labs ordered are listed, but only abnormal results are displayed) Labs Reviewed  SARS CORONAVIRUS 2 (TAT 6-24 HRS)  CULTURE, GROUP A STREP [facility])  POCT RAPID STREP A    EKG   Radiology No results found.  Procedures Procedures (including critical care time)  Medications Ordered in UC Medications - No data to display  Initial Impression / Assessment and Plan / UC Course  I have reviewed the triage vital signs and the nursing notes.  Pertinent labs & imaging results that were  available during my care of the patient were reviewed by me and considered in my medical  decision making (see chart for details).     Viral URI with cough- rapid strep test negative.  Treating with OTC medications safe in pregnancy.  covid test pending.  Precautions given.  Final Clinical Impressions(s) / UC Diagnoses   Final diagnoses:  Viral URI with cough     Discharge Instructions     Your strep test was negative This is most likely viral upper respiratory infection I am sending some medications to the pharmacy to help with your symptoms that are safe in pregnancy.  You can also do Chloraseptic throat spray or warm salt water gargles    ED Prescriptions    Medication Sig Dispense Auth. Provider   guaiFENesin (ROBITUSSIN) 100 MG/5ML liquid Take 5-10 mLs (100-200 mg total) by mouth every 4 (four) hours as needed for cough. 60 mL Falynn Ailey A, NP   cetirizine (ZYRTEC) 10 MG tablet Take 1 tablet (10 mg total) by mouth daily. 30 tablet Shirell Struthers A, NP   acetaminophen (TYLENOL) 500 MG tablet Take 1 tablet (500 mg total) by mouth every 6 (six) hours as needed. 30 tablet Loura Halt A, NP     PDMP not reviewed this encounter.   Orvan July, NP 06/17/19 1124

## 2019-06-18 ENCOUNTER — Other Ambulatory Visit (HOSPITAL_COMMUNITY): Payer: Medicaid Other

## 2019-06-18 ENCOUNTER — Encounter: Payer: Self-pay | Admitting: Advanced Practice Midwife

## 2019-06-18 ENCOUNTER — Encounter (HOSPITAL_COMMUNITY): Payer: Medicaid Other

## 2019-06-18 LAB — SARS CORONAVIRUS 2 (TAT 6-24 HRS): SARS Coronavirus 2: NEGATIVE

## 2019-06-19 ENCOUNTER — Other Ambulatory Visit: Payer: Self-pay

## 2019-06-19 DIAGNOSIS — Z3480 Encounter for supervision of other normal pregnancy, unspecified trimester: Secondary | ICD-10-CM

## 2019-06-19 LAB — CULTURE, GROUP A STREP (THRC)

## 2019-06-19 MED ORDER — FOLIC ACID 1 MG PO TABS: 1.0000 mg | ORAL_TABLET | Freq: Every day | ORAL | 10 refills | Status: DC

## 2019-06-22 ENCOUNTER — Other Ambulatory Visit (HOSPITAL_COMMUNITY): Payer: Self-pay | Admitting: *Deleted

## 2019-06-22 ENCOUNTER — Ambulatory Visit (HOSPITAL_BASED_OUTPATIENT_CLINIC_OR_DEPARTMENT_OTHER): Payer: Medicaid Other | Admitting: Genetic Counselor

## 2019-06-22 ENCOUNTER — Ambulatory Visit (HOSPITAL_COMMUNITY): Payer: Medicaid Other | Admitting: *Deleted

## 2019-06-22 ENCOUNTER — Encounter (HOSPITAL_COMMUNITY): Payer: Self-pay | Admitting: *Deleted

## 2019-06-22 ENCOUNTER — Other Ambulatory Visit: Payer: Self-pay

## 2019-06-22 ENCOUNTER — Ambulatory Visit (HOSPITAL_COMMUNITY): Payer: Medicaid Other

## 2019-06-22 ENCOUNTER — Other Ambulatory Visit: Payer: Self-pay | Admitting: Advanced Practice Midwife

## 2019-06-22 ENCOUNTER — Ambulatory Visit (HOSPITAL_COMMUNITY)
Admission: RE | Admit: 2019-06-22 | Discharge: 2019-06-22 | Disposition: A | Discharge status: Home / Self Care | Payer: Medicaid Other | Source: Ambulatory Visit | Attending: Advanced Practice Midwife | Admitting: Advanced Practice Midwife

## 2019-06-22 VITALS — BP 110/71 | HR 98 | Temp 98.0°F

## 2019-06-22 DIAGNOSIS — O280 Abnormal hematological finding on antenatal screening of mother: Secondary | ICD-10-CM | POA: Diagnosis not present

## 2019-06-22 DIAGNOSIS — D563 Thalassemia minor: Secondary | ICD-10-CM | POA: Diagnosis not present

## 2019-06-22 DIAGNOSIS — Z315 Encounter for genetic counseling: Secondary | ICD-10-CM | POA: Diagnosis not present

## 2019-06-22 DIAGNOSIS — E669 Obesity, unspecified: Secondary | ICD-10-CM | POA: Diagnosis not present

## 2019-06-22 DIAGNOSIS — O281 Abnormal biochemical finding on antenatal screening of mother: Secondary | ICD-10-CM | POA: Diagnosis not present

## 2019-06-22 DIAGNOSIS — Z363 Encounter for antenatal screening for malformations: Secondary | ICD-10-CM

## 2019-06-22 DIAGNOSIS — O99212 Obesity complicating pregnancy, second trimester: Secondary | ICD-10-CM

## 2019-06-22 DIAGNOSIS — Z3480 Encounter for supervision of other normal pregnancy, unspecified trimester: Secondary | ICD-10-CM | POA: Diagnosis not present

## 2019-06-22 DIAGNOSIS — O321XX0 Maternal care for breech presentation, not applicable or unspecified: Secondary | ICD-10-CM

## 2019-06-22 DIAGNOSIS — Z3A17 17 weeks gestation of pregnancy: Secondary | ICD-10-CM

## 2019-06-22 DIAGNOSIS — Z603 Acculturation difficulty: Secondary | ICD-10-CM

## 2019-06-22 DIAGNOSIS — Z362 Encounter for other antenatal screening follow-up: Secondary | ICD-10-CM

## 2019-06-22 DIAGNOSIS — Z148 Genetic carrier of other disease: Secondary | ICD-10-CM

## 2019-06-22 NOTE — Consult Note (Signed)
MFM Note   This patient was seen in consultation due to an elevated MSAFP of 2.70 MoM and due to maternal morbid obesity.  She reports no other complications in her current pregnancy.  She is also a carrier for alpha thalassemia.   She had a cell free DNA test which indicated a low risk for trisomy 21, 18, and 13.  The patient was advised of the ultrasound findings that failed to reveal an anatomical cause for the increased MSAFP.  There were no sonographic signs of spina bifida or an anterior abdominal wall defect noted today.  However, today's exam was limited due to her early gestational age and extreme maternal body habitus.    She was advised regarding the limitations of ultrasound in the detection of all anomalies and that it will diagnose approximately 90% of neural tube defects.  The association of an elevated MSAFP with placental dysfunction which may manifest later in her pregnancy as fetal growth restriction along with with other adverse pregnancy outcomes such as fetal demise was discussed.  Due to the elevated MSAFP, the patient was offered and declined an amniocentesis today for definitive diagnosis of fetal aneuploidy and spina bifida.  Due to the elevated MSAFP, she should continue to be followed with growth ultrasounds every  4 to 5 weeks.    A follow-up exam was scheduled in 4 weeks to complete the views of the fetal anatomy.    The patient is also meeting with our genetic counselor regarding the significance of the elevated MSAFP and as she is a carrier for alpha thalassemia.  All conversations were held with the patient today with the help of a Spanish interpreter.  A total of 30 minutes was spent counseling and coordinating the care for this patient.  Greater than 50% of the time was spent in direct face-to-face contact.

## 2019-06-22 NOTE — Progress Notes (Signed)
06/22/2019  Lisa Herrera 17-Dec-1995 MRN: [id number] DOV: 06/22/2019  Lisa Herrera presented to the [facility] for a genetics consultation regarding abnormal MS-AFP screening results and her carrier status for alpha-thalassemia. Lisa Herrera was accompanied to her appointment by a [facility] Spanish interpreter. Her husband also participated in the session via FaceTime.   Indication for genetic counseling - Increased risk for open neural tube defect onMS-AFPscreening(2.7 MoM) - Silent carrier for alpha-thalassemia  Prenatal history  Lisa Herrera is a G75P1001, 24 y.o. female. Her current pregnancy has completed [redacted]w[redacted]d (Estimated Date of Delivery: 11/24/19).  Lisa Herrera denied exposure to environmental toxins or chemical agents. She denied the use of alcohol, tobacco or street drugs. She reported taking prenatal vitamins. She denied significant viral illnesses, fevers, and bleeding during the course of her pregnancy. Her medical and surgical histories were noncontributory.  Family History  A three generation pedigree was drafted and reviewed. The family history is remarkable for the following:  - The couple has a daughter who has a "dark spot on her lower back with a tuft of hair". Ms. Yankey husband, Auburn Bilberry, reportedly has the same thing. We discussed that there is one form of spina bifida called spina bifida occulta in which affected individuals have a slight spinal defect and may have a small dimple, tuft of hair, or birth mark at the base of their spine. Spina bifida occulta rarely causes symptoms and most often does not require treatment. If Mr. Shawnie Dapper and the couple's daughter have spina bifida occulta, recurrence risk for future children would be increased. However, without knowing if they definitively have spina bifida occulta, risk assessment is limited.  - Lisa Herrera's sister has a daughter whose ureter was reportedly "not fully formed" at birth. This  child also reportedly has vesicoureteral reflux. We reviewed that genitourinary anomalies can be isolated or a feature of an underlying genetic condition. Every pregnancy has a 3-5% chance for a birth defect such as a genitourinary anomaly. However, without knowing the exact etiology of the genitourinary anomaly in Lisa Herrera's niece, precise risk assessment is limited.  - Mr. Shawnie Dapper has dyslexia. He also has a brother and a sister with dyslexia. Mr. Irena Cords paternal aunt also has a son with speech delay who did not speak until he was 26 years old. We discussed that many times, learning difficulties such as dyslexia and developmental delays are multifactorial in nature, occurring due to a combination of genetic and environmental factors that are difficult to identify. Learning difficulties and developmental delays can appear to run in families; thus, there is a chance that the couple's children could also experience this. They understand that they should make the pediatrician aware of any concerns they have about their children's development.  The remaining family histories were reviewed and found to be noncontributory for birth defects, intellectual disability, recurrent pregnancy loss, and known genetic conditions.    The patient's ethnicity is [place]. The father of the pregnancy's ethnicity is [place]. Ashkenazi Jewish ancestry and consanguinity were denied. Pedigree will be scanned under Media.  Discussion  Lisa Herrera was referred to genetic counseling as an increased risk for an open neural tube defect (ONTD) in the current fetus was identified on MS-AFP screening. Results of the screen indicated that the current fetus has a 1 in 198 (0.5%) risk to be affected with an ONTD such as spina bifida.   We reviewed these MS-AFP results in detail. We discussed that there are many explanations  for an elevated AFP result, including twin pregnancies, dating errors, ONTDs such as anencephaly or spina bifida,  abdominal wall defects, placental abnormalities, fetal growth restriction, adverse obstetrical outcomes (oligohydramnios, placental abruption, intrauterine fetal demise/stillbirth, preterm delivery, premature rupture of membranes, or preeclampsia), or a normal variant.    Lisa Herrera had her anatomy ultrasound performed prior to her genetic counseling appointment. The ultrasound report will be sent under separate cover. There were no visualized fetal anomalies or markers suggestive of aneuploidy. Anatomy ultrasound also did not detect twins, ONTDs, abdominal wall defects, placental abnormalities, or oligohydramnios. A dating error is not likely to be the cause for the elevated AFP result as gestational age on ultrasound today was consistent with Lisa Herrera's working EDD. In light of a normal anatomy ultrasound, it is possible that an explanation may not be found for Lisa Herrera's elevated MS-AFP. Given this unexplained elevated AFP level, the pregnancy will be monitored more closely for complications such as fetal growth restriction and maternal hypertension.   Lisa Herrera also had Horizon-14 carrier screening performed through [place]. The results of the screen identified her as a silent carrier for alpha-thalassemia (aa/a-). Alpha-thalassemia is different in its inheritance compared to other hemoglobinopathies as there are two copies of two alpha globin genes (HBA1 and HBA2) on each chromosome 16, or four alpha globin genes total (aa/aa). A person can be a carrier of one alpha gene mutation (aa/a-), also referred to as a "silent carrier". A person who carries two alpha globin gene mutations can either carry them in cis (both on the same chromosome, denoted as aa/--) or in trans (on different chromosomes, denoted as a-/a-). Cis configuration is most common in individuals with Asian ancestry.     There are several different forms of alpha-thalassemia. The most severe form of alpha-thalassemia, Hb Barts, is associated  with an absence of alpha globin chain synthesis as a result of deletions of all four alpha globin genes (--/--).  Given that Lisa Herrera is a silent carrier (aa/a-), her pregnancies would not be at increased risk for Hb Barts, even if her partner is a carrier for alpha-thalassemia, as she will always pass on at least one copy of the alpha globin gene to her children. Hemoglobin H (HbH) disease is caused by three deleted or dysfunctioning alpha globin alleles (a-/--) and is characterized by microcytic hypochromic hemolytic anemia, hepatosplenomegaly, mild jaundice, growth retardation, and sometimes thalassemia-like bone changes. Given Lisa Herrera's silent carrier status (aa/a-), the current fetus would only be at risk for HbH disease (a-/--), if her partner is a carrier for two alpha globin mutations in cis (aa/--). If this is the case, the risk for HbH disease in the pregnancy would be 1 in 4 (25%). If he is a carrier of alpha-thalassemia in trans or a silent carrier, then the pregnancy would not be at increased risk for HbH disease. Based on the carrier frequency for alpha-thalassemia in the general population, Lisa Herrera partner has a 1 in 25 chance of being any type of carrier for alpha-thalassemia. Information about the carrier frequency for alpha-thalassemia in the Hispanic population specifically is scarce.   Lisa Herrera carrier screening was negative for the other 13 conditions screened. Thus, her risk to be a carrier for these additional conditions (listed separately in the laboratory report) has been reduced but not eliminated. This also significantly reduces her risk of having a child affected by one of these conditions. We discussed that carrier testing for alpha-thalassemia is recommended for Lisa Herrera's partner.  Based on the carrier frequency for alpha-thalassemia in the general population, the couple has, at most, a 1 in 100 (1%) chance of having a child with HbH disease. The couple felt comfortable  with this risk estimate and elected not to pursue partner carrier screening.  We also reviewed that Lisa Herrera had Panorama NIPS through the laboratory [facility] that was low-risk for fetal aneuploidies. We reviewed that these results showed a less than 1 in 10,000 risk for trisomies 21, 18 and 13, and monosomy X (Turner syndrome).  In addition, the risk for triploidy and sex chromosome trisomies (47,XXX and 47,XXY) was also low. Lisa Herrera elected to have cfDNA analysis for 22q11.2 deletion syndrome, which was also low risk (1 in 2900). We reviewed that while this testing identifies 94-99% of pregnancies with trisomy 47, trisomy 99, trisomy 65, sex chromosome aneuploidies, and triploidy, it is NOT diagnostic. A positive test result requires confirmation by CVS or amniocentesis, and a negative test result does not rule out a fetal chromosome abnormality. She also understands that this testing does not identify all genetic conditions.  Lisa Herrera was also counseled regarding diagnostic testing via amniocentesis. We discussed the technical aspects of the procedure and quoted up to a 1 in 500 (0.2%) risk for spontaneous pregnancy loss or other adverse pregnancy outcomes as a result of amniocentesis. Cultured cells from an amniocentesis sample allow for the visualization of a fetal karyotype, which can detect >99% of chromosomal aberrations. Chromosomal microarray can also be performed to identify smaller deletions or duplications of fetal chromosomal material. Amniocentesis could also be performed to assess whether the baby is affected by alpha-thalasemia. After careful consideration, Ms. Lingerfelt declined amniocentesis at this time. She understands that amniocentesis is available at any point after 16 weeks of pregnancy and that she may opt to undergo the procedure at a later date should she change her mind.  Additional screening and diagnostic testing were declined today. Ms. Ninneman understands that screening tests,  including ultrasound, cannot rule out all birth defects or genetic syndromes. The patient was advised of this limitation and states she still does not want additional testing or screening at this time.   I counseled Ms. Rewis regarding the above risks and available options. The approximate face-to-face time with the genetic counselor was 60 minutes.  In summary:  Reviewed MS-AFP result  Elevated MS-AFP (MoM2.7,1 in198 risk for open neural tube defects)  Reviewed results of ultrasound  No fetal anomalies or markers seen  Reduction in risk for fetal aneuploidy  Discussed carrier screening results  Silent carrier for alpha-thalassemia  Declined partner carrier screening  Reviewed low-risk NIPS result  Reduction in risk for Down syndrome,trisomy 18,trisomy 13, sex chromosome aneuploidies, and 22q11.2deletionsyndrome  Offered additional testing and screening  Declined amniocentesis  Reviewed family history concerns   Gershon Crane, MS, Aeronautical engineer

## 2019-06-23 ENCOUNTER — Encounter: Payer: Self-pay | Admitting: Advanced Practice Midwife

## 2019-06-23 DIAGNOSIS — R772 Abnormality of alphafetoprotein: Secondary | ICD-10-CM | POA: Insufficient documentation

## 2019-06-30 ENCOUNTER — Ambulatory Visit: Payer: Medicaid Other | Admitting: Dietician

## 2019-07-01 ENCOUNTER — Ambulatory Visit (HOSPITAL_COMMUNITY): Payer: Medicaid Other

## 2019-07-07 ENCOUNTER — Inpatient Hospital Stay (HOSPITAL_COMMUNITY)
Admission: AD | Admit: 2019-07-07 | Discharge: 2019-07-07 | Disposition: A | Discharge status: Home / Self Care | Payer: Medicaid Other | Attending: Obstetrics and Gynecology | Admitting: Obstetrics and Gynecology

## 2019-07-07 ENCOUNTER — Encounter (HOSPITAL_COMMUNITY): Payer: Self-pay | Admitting: Obstetrics and Gynecology

## 2019-07-07 ENCOUNTER — Telehealth (INDEPENDENT_AMBULATORY_CARE_PROVIDER_SITE_OTHER): Payer: Medicaid Other | Admitting: Obstetrics

## 2019-07-07 ENCOUNTER — Other Ambulatory Visit: Payer: Self-pay

## 2019-07-07 ENCOUNTER — Encounter: Payer: Self-pay | Admitting: Obstetrics

## 2019-07-07 DIAGNOSIS — Z7982 Long term (current) use of aspirin: Secondary | ICD-10-CM | POA: Insufficient documentation

## 2019-07-07 DIAGNOSIS — Z3A20 20 weeks gestation of pregnancy: Secondary | ICD-10-CM | POA: Insufficient documentation

## 2019-07-07 DIAGNOSIS — O99891 Other specified diseases and conditions complicating pregnancy: Secondary | ICD-10-CM

## 2019-07-07 DIAGNOSIS — O99612 Diseases of the digestive system complicating pregnancy, second trimester: Secondary | ICD-10-CM

## 2019-07-07 DIAGNOSIS — R109 Unspecified abdominal pain: Secondary | ICD-10-CM | POA: Diagnosis present

## 2019-07-07 DIAGNOSIS — O99212 Obesity complicating pregnancy, second trimester: Secondary | ICD-10-CM | POA: Diagnosis not present

## 2019-07-07 DIAGNOSIS — E669 Obesity, unspecified: Secondary | ICD-10-CM

## 2019-07-07 DIAGNOSIS — O09292 Supervision of pregnancy with other poor reproductive or obstetric history, second trimester: Secondary | ICD-10-CM | POA: Diagnosis not present

## 2019-07-07 DIAGNOSIS — K5900 Constipation, unspecified: Secondary | ICD-10-CM | POA: Diagnosis not present

## 2019-07-07 DIAGNOSIS — M549 Dorsalgia, unspecified: Secondary | ICD-10-CM | POA: Diagnosis not present

## 2019-07-07 DIAGNOSIS — O34219 Maternal care for unspecified type scar from previous cesarean delivery: Secondary | ICD-10-CM

## 2019-07-07 DIAGNOSIS — O0992 Supervision of high risk pregnancy, unspecified, second trimester: Secondary | ICD-10-CM | POA: Diagnosis not present

## 2019-07-07 DIAGNOSIS — O26892 Other specified pregnancy related conditions, second trimester: Secondary | ICD-10-CM | POA: Insufficient documentation

## 2019-07-07 DIAGNOSIS — O0990 Supervision of high risk pregnancy, unspecified, unspecified trimester: Secondary | ICD-10-CM

## 2019-07-07 DIAGNOSIS — O99210 Obesity complicating pregnancy, unspecified trimester: Secondary | ICD-10-CM

## 2019-07-07 LAB — COMPREHENSIVE METABOLIC PANEL
ALT: 15 U/L (ref 0–44)
AST: 16 U/L (ref 15–41)
Albumin: 2.5 g/dL — ABNORMAL LOW (ref 3.5–5.0)
Alkaline Phosphatase: 56 U/L (ref 38–126)
Anion gap: 8 (ref 5–15)
BUN: <5 mg/dL — ABNORMAL LOW (ref 6–20)
CO2: 22 mmol/L (ref 22–32)
Calcium: 8.7 mg/dL — ABNORMAL LOW (ref 8.9–10.3)
Chloride: 109 mmol/L (ref 98–111)
Creatinine, Ser: 0.47 mg/dL (ref 0.44–1.00)
GFR calc Af Amer: >60 mL/min (ref >60)
GFR calc non Af Amer: >60 mL/min (ref >60)
Glucose, Bld: 121 mg/dL — ABNORMAL HIGH (ref 70–99)
Potassium: 3.9 mmol/L (ref 3.5–5.1)
Sodium: 139 mmol/L (ref 135–145)
Total Bilirubin: 0.2 mg/dL — ABNORMAL LOW (ref 0.3–1.2)
Total Protein: 5.9 g/dL — ABNORMAL LOW (ref 6.5–8.1)

## 2019-07-07 LAB — CBC WITH DIFFERENTIAL/PLATELET
Abs Immature Granulocytes: 0.03 10*3/uL (ref 0.00–0.07)
Basophils Absolute: 0 10*3/uL (ref 0.0–0.1)
Basophils Relative: 0 %
Eosinophils Absolute: 0.1 10*3/uL (ref 0.0–0.5)
Eosinophils Relative: 1 %
HCT: 38.7 % (ref 36.0–46.0)
Hemoglobin: 12.3 g/dL (ref 12.0–15.0)
Immature Granulocytes: 0 %
Lymphocytes Relative: 23 %
Lymphs Abs: 1.6 10*3/uL (ref 0.7–4.0)
MCH: 29.1 pg (ref 26.0–34.0)
MCHC: 31.8 g/dL (ref 30.0–36.0)
MCV: 91.7 fL (ref 80.0–100.0)
Monocytes Absolute: 0.5 10*3/uL (ref 0.1–1.0)
Monocytes Relative: 7 %
Neutro Abs: 4.7 10*3/uL (ref 1.7–7.7)
Neutrophils Relative %: 69 %
Platelets: 264 10*3/uL (ref 150–400)
RBC: 4.22 MIL/uL (ref 3.87–5.11)
RDW: 13.1 % (ref 11.5–15.5)
WBC: 6.9 10*3/uL (ref 4.0–10.5)
nRBC: 0 % (ref 0.0–0.2)

## 2019-07-07 LAB — URINALYSIS, ROUTINE W REFLEX MICROSCOPIC
Bilirubin Urine: NEGATIVE
Glucose, UA: NEGATIVE mg/dL
Hgb urine dipstick: NEGATIVE
Ketones, ur: 5 mg/dL — AB
Nitrite: NEGATIVE
Protein, ur: 30 mg/dL — AB
Specific Gravity, Urine: 1.024 (ref 1.005–1.030)
pH: 6 (ref 5.0–8.0)

## 2019-07-07 MED ORDER — ACETAMINOPHEN 500 MG PO TABS
1000.0000 mg | ORAL_TABLET | Freq: Once | ORAL | Status: AC
  Administered 2019-07-07: 1000 mg via ORAL
  Filled 2019-07-07: qty 2

## 2019-07-07 MED ORDER — CYCLOBENZAPRINE HCL 5 MG PO TABS
10.0000 mg | ORAL_TABLET | Freq: Once | ORAL | Status: AC
  Administered 2019-07-07: 12:00:00 10 mg via ORAL
  Filled 2019-07-07: qty 2

## 2019-07-07 MED ORDER — CYCLOBENZAPRINE HCL 5 MG PO TABS: 5.0000 mg | ORAL_TABLET | Freq: Three times a day (TID) | ORAL | 0 refills | Status: DC | PRN

## 2019-07-07 NOTE — MAU Provider Note (Signed)
Chief Complaint: Flank Pain   First Provider Initiated Contact with Patient 07/07/19 1118     *Spanish interpreter at bedside for this encounter*  SUBJECTIVE HPI: Lisa Herrera is a 24 y.o. G2P1001 at 33w0dwho presents to Maternity Admissions reporting left flank pain. Symptoms started 2 weeks ago and worsened over the last 4 days. Reports pain that radiates from left mid back, to LUQ, and left flank. Pain comes & goes. Describes as sharp. Thinks pain is worse after taking her meds (BASA, PNV, prilosec, & folic acid). States she drinks at least 5 bottles of water per day and her urine still appears dark. Denies n/v, fever/chills, dysuria, hematuria, vaginal bleeding, or LOF. Having issues with constipation. Last BM was last night, hard & small. Is not treating constipation. Has not treated pain.   Location: left abdomen, back , flank Quality: sharp Severity: 5/10 on pain scale Duration: 2 weeks Timing: intermittent Modifying factors: worse after meds. Hasn't treated symptoms Associated signs and symptoms: none  Past Medical History:  Diagnosis Date  . Gallstones   . Migraine    OB History  Gravida Para Term Preterm AB Living  _0 SAB TAB Ectopic Multiple Live Births        0 1    # Outcome Date GA Lbr Len/2nd Weight Sex Delivery Anes PTL Lv  2 Current           1 Term 01/14/16 444w1d 02:47 3245 g F CS-LTranv EPI  LIV    Obstetric Comments  C/S due to breech baby    Past Surgical History:  Procedure Laterality Date  . CESAREAN SECTION N/A 01/14/2016   Procedure: CESAREAN SECTION;  Surgeon: UgOsborne OmanMD;  [facility] [facility];  Laterality: N/A;  . CHOLECYSTECTOMY     Social History   Socioeconomic History  . Marital status: Married    Spouse name: Not on file  . Number of children: Not on file  . Years of education: Not on file  . Highest education level: Not on file  Occupational History  . Not on file  Tobacco Use  .  Smoking status: Never Smoker  . Smokeless tobacco: Never Used  Substance and Sexual Activity  . Alcohol use: No    Alcohol/week: 0.0 standard drinks  . Drug use: No  . Sexual activity: Yes    Birth control/protection: None  Other Topics Concern  . Not on file  Social History Narrative  . Not on file   Social Determinants of Health   Financial Resource Strain:   . Difficulty of Paying Living Expenses:   Food Insecurity:   . Worried About RuCharity fundraisern the Last Year:   . RaArboriculturistn the Last Year:   Transportation Needs:   . LaFilm/video editorMedical):   . Marland Kitchenack of Transportation (Non-Medical):   Physical Activity:   . Days of Exercise per Week:   . Minutes of Exercise per Session:   Stress:   . Feeling of Stress :   Social Connections:   . Frequency of Communication with Friends and Family:   . Frequency of Social Gatherings with Friends and Family:   . Attends Religious Services:   . Active Member of Clubs or Organizations:   . Attends ClArchivisteetings:   . Marland Kitchenarital Status:   Intimate Partner Violence:   . Fear of Current or Ex-Partner:   .  Emotionally Abused:   Marland Kitchen Physically Abused:   . Sexually Abused:    Family History  Problem Relation Age of Onset  . Breast cancer Maternal Aunt        50's   No current facility-administered medications on file prior to encounter.   Current Outpatient Medications on File Prior to Encounter  Medication Sig Dispense Refill  . aspirin EC 81 MG tablet Take 1 tablet (81 mg total) by mouth daily. 30 tablet 5  . omeprazole (PRILOSEC) 20 MG capsule Take 20 mg by mouth daily.    . Prenatal Vit-Fe Fumarate-FA (PRENATAL MULTIVITAMIN) TABS tablet Take 1 tablet by mouth daily at 12 noon.    Marland Kitchen acetaminophen (TYLENOL) 500 MG tablet Take 1 tablet (500 mg total) by mouth every 6 (six) hours as needed. 30 tablet 0  . Blood Pressure Monitoring (BLOOD PRESSURE KIT) DEVI 1 kit by Does not apply route once a week.  Check Blood Pressure regularly and record readings into the Babyscripts App.  Large Cuff.  DX M54.6 1 each 0  . folic acid (FOLVITE) 1 MG tablet Take 1 tablet (1 mg total) by mouth daily. (Patient not taking: Reported on 06/22/2019) 30 tablet 10   No Known Allergies  I have reviewed patient's Past Medical Hx, Surgical Hx, Family Hx, Social Hx, medications and allergies.   Review of Systems  Constitutional: Negative.   Gastrointestinal: Positive for abdominal pain and constipation. Negative for nausea and vomiting.  Genitourinary: Positive for flank pain and frequency. Negative for dysuria, hematuria, vaginal bleeding and vaginal discharge.  Musculoskeletal: Positive for back pain.    OBJECTIVE Patient Vitals for the past 24 hrs:  BP Temp Temp src Pulse Resp SpO2 Weight  07/07/19 1349 (!) 108/56 98.6 F (37 C) Oral 86 20 98 % --  07/07/19 1014 136/65 98.2 F (36.8 C) Oral 83 18 97 % 116.8 kg   Constitutional: Well-developed, well-nourished female in no acute distress.  Cardiovascular: normal rate & rhythm, no murmur Respiratory: normal rate and effort. Lung sounds clear throughout GI: No CVAT. Abd soft, non-tender, Pos BS x 4. No guarding or rebound tenderness MS: Extremities nontender, no edema, normal ROM Neurologic: Alert and oriented x 4.    LAB RESULTS Results for orders placed or performed during the hospital encounter of 07/07/19 (from the past 24 hour(s))  Urinalysis, Routine w reflex microscopic     Status: Abnormal   Collection Time: 07/07/19 10:59 AM  Result Value Ref Range   Color, Urine YELLOW YELLOW   APPearance CLOUDY (A) CLEAR   Specific Gravity, Urine 1.024 1.005 - 1.030   pH 6.0 5.0 - 8.0   Glucose, UA NEGATIVE NEGATIVE mg/dL   Hgb urine dipstick NEGATIVE NEGATIVE   Bilirubin Urine NEGATIVE NEGATIVE   Ketones, ur 5 (A) NEGATIVE mg/dL   Protein, ur 30 (A) NEGATIVE mg/dL   Nitrite NEGATIVE NEGATIVE   Leukocytes,Ua TRACE (A) NEGATIVE   RBC / HPF 0-5 0 - 5  RBC/hpf   WBC, UA 6-10 0 - 5 WBC/hpf   Bacteria, UA RARE (A) NONE SEEN   Squamous Epithelial / LPF 11-20 0 - 5   Mucus PRESENT    Ca Oxalate Crys, UA PRESENT   CBC with Differential/Platelet     Status: None   Collection Time: 07/07/19 11:20 AM  Result Value Ref Range   WBC 6.9 4.0 - 10.5 K/uL   RBC 4.22 3.87 - 5.11 MIL/uL   Hemoglobin 12.3 12.0 - 15.0 g/dL   HCT  38.7 36.0 - 46.0 %   MCV 91.7 80.0 - 100.0 fL   MCH 29.1 26.0 - 34.0 pg   MCHC 31.8 30.0 - 36.0 g/dL   RDW 13.1 11.5 - 15.5 %   Platelets 264 150 - 400 K/uL   nRBC 0.0 0.0 - 0.2 %   Neutrophils Relative % 69 %   Neutro Abs 4.7 1.7 - 7.7 K/uL   Lymphocytes Relative 23 %   Lymphs Abs 1.6 0.7 - 4.0 K/uL   Monocytes Relative 7 %   Monocytes Absolute 0.5 0.1 - 1.0 K/uL   Eosinophils Relative 1 %   Eosinophils Absolute 0.1 0.0 - 0.5 K/uL   Basophils Relative 0 %   Basophils Absolute 0.0 0.0 - 0.1 K/uL   Immature Granulocytes 0 %   Abs Immature Granulocytes 0.03 0.00 - 0.07 K/uL  Comprehensive metabolic panel     Status: Abnormal   Collection Time: 07/07/19 11:20 AM  Result Value Ref Range   Sodium 139 135 - 145 mmol/L   Potassium 3.9 3.5 - 5.1 mmol/L   Chloride 109 98 - 111 mmol/L   CO2 22 22 - 32 mmol/L   Glucose, Bld 121 (H) 70 - 99 mg/dL   BUN <5 (L) 6 - 20 mg/dL   Creatinine, Ser 0.47 0.44 - 1.00 mg/dL   Calcium 8.7 (L) 8.9 - 10.3 mg/dL   Total Protein 5.9 (L) 6.5 - 8.1 g/dL   Albumin 2.5 (L) 3.5 - 5.0 g/dL   AST 16 15 - 41 U/L   ALT 15 0 - 44 U/L   Alkaline Phosphatase 56 38 - 126 U/L   Total Bilirubin 0.2 (L) 0.3 - 1.2 mg/dL   GFR calc non Af Amer >60 >60 mL/min   GFR calc Af Amer >60 >60 mL/min   Anion gap 8 5 - 15    IMAGING No results found.  MAU COURSE Orders Placed This Encounter  Procedures  . Culture, OB Urine  . Urinalysis, Routine w reflex microscopic  . CBC with Differential/Platelet  . Comprehensive metabolic panel  . Discharge patient   Meds ordered this encounter  Medications  .  acetaminophen (TYLENOL) tablet 1,000 mg  . cyclobenzaprine (FLEXERIL) tablet 10 mg  . cyclobenzaprine (FLEXERIL) 5 MG tablet    Sig: Take 1 tablet (5 mg total) by mouth 3 (three) times daily as needed for muscle spasms.    Dispense:  20 tablet    Refill:  0    Order Specific Question:   Supervising Provider    Answer:   Arlina Robes L [[id number]]    MDM FHT present via doppler  VSS. Afebrile. No CVA tenderness. U/a negative for infection.   CBC & CMP look ok.   Tylenol & flexeril given with complete resolution of pain.  Take tylenol prn at home, will give small rx flexeril for worsening pain. Also discussed treatment of constipation.   ASSESSMENT 1. Back pain affecting pregnancy in second trimester   2. [redacted] weeks gestation of pregnancy   3. Constipation during pregnancy in second trimester     PLAN Discharge home in stable condition. Rx flexeril Use OTC tylenol prn Education for tx of constipation   Allergies as of 07/07/2019   No Known Allergies     Medication List    STOP taking these medications   cetirizine 10 MG tablet Commonly known as: ZYRTEC   guaiFENesin 100 MG/5ML liquid Commonly known as: ROBITUSSIN     TAKE these medications  acetaminophen 500 MG tablet Commonly known as: TYLENOL Take 1 tablet (500 mg total) by mouth every 6 (six) hours as needed.   aspirin EC 81 MG tablet Take 1 tablet (81 mg total) by mouth daily.   Blood Pressure Kit Devi 1 kit by Does not apply route once a week. Check Blood Pressure regularly and record readings into the Babyscripts App.  Large Cuff.  DX O90.0   cyclobenzaprine 5 MG tablet Commonly known as: FLEXERIL Take 1 tablet (5 mg total) by mouth 3 (three) times daily as needed for muscle spasms.   folic acid 1 MG tablet Commonly known as: FOLVITE Take 1 tablet (1 mg total) by mouth daily.   omeprazole 20 MG capsule Commonly known as: PRILOSEC Take 20 mg by mouth daily.   prenatal multivitamin Tabs tablet Take 1  tablet by mouth daily at 12 noon.        Jorje Guild, NP 07/07/2019  3:49 PM

## 2019-07-07 NOTE — Progress Notes (Signed)
OBSTETRICS PRENATAL VIRTUAL VISIT ENCOUNTER NOTE  Provider location: Center for [facility] at [place]   I connected with Lisa Herrera on 07/07/19 at  9:15 AM EDT by MyChart Video Encounter at home and verified that I am speaking with the correct person using two identifiers.   I discussed the limitations, risks, security and privacy concerns of performing an evaluation and management service virtually and the availability of in person appointments. I also discussed with the patient that there may be a patient responsible charge related to this service. The patient expressed understanding and agreed to proceed. Subjective:  Lisa Herrera is a 24 y.o. G2P1001 at [redacted]w[redacted]d being seen today for ongoing prenatal care.  She is currently monitored for the following issues for this low-risk pregnancy and has Maternal varicella, non-immune; S/P cesarean section for arrest of descent; Supervision of other normal pregnancy, antepartum; Nausea and vomiting during pregnancy prior to [redacted] weeks gestation; Gastroesophageal reflux disease with esophagitis without hemorrhage; Obesity affecting pregnancy in first trimester; Hx of cholelithiasis; Alpha thalassemia silent carrier; and Elevated AFP on their problem list.  Patient reports none.   .  .   . Denies any leaking of fluid.   The following portions of the patient's history were reviewed and updated as appropriate: allergies, current medications, past family history, past medical history, past social history, past surgical history and problem list.   Objective:  There were no vitals filed for this visit.  Fetal Status:           General:  Alert, oriented and cooperative. Patient is in no acute distress.  Respiratory: Normal respiratory effort, no problems with respiration noted  Mental Status: Normal mood and affect. Normal behavior. Normal judgment and thought content.  Rest of physical exam deferred due to type of encounter  Imaging: [place] MFM OB  DETAIL +14 WK  Result Date: 06/22/2019 ----------------------------------------------------------------------  OBSTETRICS REPORT                       (Signed Final 06/22/2019 09:35 am) ---------------------------------------------------------------------- Patient Info  ID #:       [id number]                          D.O.B.:  09/05/1995 (24 yrs)  Name:       Lisa Herrera                    Visit Date: 06/22/2019 07:48 am ---------------------------------------------------------------------- Performed By  Performed By:     Tommie Raymond BS,       Ref. Address:     [address]                                                             [address]  [address]                                                             [place]  Attending:        Johnell Comings MD         [facility]  Referred By:      [place] ---------------------------------------------------------------------- Orders   #  Description                          Code         Ordered By   1  [place] MFM OB DETAIL +14 [place]              76811.01     LISA LEFTWICH-                                                        KIRBY  ----------------------------------------------------------------------   #  Order #                    Accession #                 Episode #   1  [id number]                  [id number]                  [id number]  ---------------------------------------------------------------------- Indications   Abnormal biochemical screen                    Y19.5   Obesity complicating pregnancy, second         O99.212   trimester (BMI 48)   [redacted] weeks gestation of pregnancy                Z3A.17   Genetic carrier Network engineer)                   Z14.8   Encounter for antenatal screening for          Z36.3    malformations  ---------------------------------------------------------------------- Fetal Evaluation  Num Of Fetuses:         1  Fetal Heart Rate(bpm):  149  Cardiac Activity:       Observed  Presentation:  Breech  Placenta:               Anterior Fundal  P. Cord Insertion:      Visualized, central  Amniotic Fluid  AFI FV:      Within normal limits                              Largest Pocket(cm)                              3.62 ---------------------------------------------------------------------- Biometry  BPD:      39.9  mm     G. Age:  18w 1d         63  %    CI:        74.52   %    70 - 86                                                          FL/HC:      17.7   %    15.8 - 18  HC:      146.7  mm     G. Age:  17w 6d         40  %    HC/AC:      1.10        1.07 - 1.29  AC:      133.4  mm     G. Age:  18w 6d         79  %    FL/BPD:     65.2   %  FL:         26  mm     G. Age:  17w 6d         46  %    FL/AC:      19.5   %    20 - 24  HUM:      26.3  mm     G. Age:  18w 2d         69  %  CER:      17.3  mm     G. Age:  17w 2d         37  %  NFT:       5.1  mm  LV:        7.3  mm  CM:        3.4  mm  Est. FW:     234  gm      0 lb 8 oz     74  % ---------------------------------------------------------------------- OB History  Blood Type:    O+  Gravidity:    2         Term:   1        Prem:   0        SAB:   0  TOP:          0       Ectopic:  0        Living: 1 ---------------------------------------------------------------------- Gestational Age  LMP:           17w 6d  Date:  02/17/19                 EDD:   11/24/19  U/S Today:     18w 1d                                        EDD:   11/22/19  Best:          17w 6d     Det. By:  LMP  (02/17/19)          EDD:   11/24/19 ---------------------------------------------------------------------- Anatomy  Cranium:               Appears normal         LVOT:                   Appears normal  Cavum:                 Appears normal         Aortic Arch:             Not well visualized  Ventricles:            Appears normal         Ductal Arch:            Not well visualized  Choroid Plexus:        Appears normal         Diaphragm:              Appears normal  Cerebellum:            Appears normal         Stomach:                Appears normal, left                                                                        sided  Posterior Fossa:       Appears normal         Abdomen:                Appears normal  Nuchal Fold:           Appears normal         Abdominal Wall:         Appears nml (cord                                                                        insert, abd wall)  Face:                  Appears normal         Cord Vessels:           Appears normal (3                         (  orbits and profile)                           vessel cord)  Lips:                  Not well visualized    Kidneys:                Appear normal  Palate:                Not well visualized    Bladder:                Appears normal  Thoracic:              Appears normal         Spine:                  Not well visualized  Heart:                 Appears normal         Upper Extremities:      Visualized                         (4CH, axis, and                         situs)  RVOT:                  Not well visualized    Lower Extremities:      Visualized  Other:  Technically difficult due to maternal habitus and fetal position. ---------------------------------------------------------------------- Cervix Uterus Adnexa  Cervix  Length:           4.71  cm.  Normal appearance by transabdominal scan.  Uterus  No abnormality visualized.  Left Ovary  Within normal limits. No adnexal mass visualized.  Right Ovary  Within normal limits. No adnexal mass visualized.  Cul De Sac  No free fluid seen.  Adnexa  No abnormality visualized. ---------------------------------------------------------------------- Comments  This patient was seen in consultation due to an elevated  MSAFP of 2.70 MoM and  due to maternal morbid obesity.  She reports no other complications in her current pregnancy.  She is also a carrier for alpha thalassemia.   She had a cell free DNA test which indicated a low risk for  trisomy 21, 18, and 13.  The patient was advised of the ultrasound findings that failed  to reveal an anatomical cause for the increased MSAFP.  There were no sonographic signs of spina bifida or an  anterior abdominal wall defect noted today.  However, today's  exam was limited due to her early gestational age and  extreme maternal body habitus.  She was advised regarding the limitations of ultrasound in the  detection of all anomalies and that it will diagnose  approximately 90% of neural tube defects.  The association  of an elevated MSAFP with placental dysfunction which may  manifest later in her pregnancy as fetal growth restriction  along with with other adverse pregnancy outcomes such as  fetal demise was discussed.  Due to the elevated MSAFP, the patient was offered and  declined an amniocentesis today for definitive diagnosis of  fetal aneuploidy and spina bifida.  Due to the elevated MSAFP, she should continue to be  followed with growth ultrasounds every 4 to 5 weeks.  A follow-up exam was scheduled in 4 weeks to complete the  views of the fetal anatomy.  The patient is also meeting with our genetic counselor  regarding the significance of the elevated MSAFP and as she  is a carrier for alpha thalassemia.  All conversations were held with the patient today with the  help of a Spanish interpreter.  A total of 30 minutes was spent counseling and coordinating  the care for this patient.  Greater than 50% of the time was  spent in direct face-to-face contact. ----------------------------------------------------------------------                   Ma Rings, MD Electronically Signed Final Report   06/22/2019 09:35 am ----------------------------------------------------------------------   Assessment and  Plan:  Pregnancy: G2P1001 at [redacted]w[redacted]d 1. Supervision of high risk pregnancy, antepartum  2. Hx of preeclampsia, prior pregnancy, currently pregnant, second trimester - taking Baby ASA  3. History of cesarean section complicating pregnancy  4. Obesity affecting pregnancy, antepartum   Preterm labor symptoms and general obstetric precautions including but not limited to vaginal bleeding, contractions, leaking of fluid and fetal movement were reviewed in detail with the patient. I discussed the assessment and treatment plan with the patient. The patient was provided an opportunity to ask questions and all were answered. The patient agreed with the plan and demonstrated an understanding of the instructions. The patient was advised to call back or seek an in-person office evaluation/go to [facility]U at [facility] for any urgent or concerning symptoms. Please refer to After Visit Summary for other counseling recommendations.   I provided 10 minutes of face-to-face time during this encounter.  No follow-ups on file.  Future Appointments  Date Time Provider Department Center  07/20/2019  7:30 AM [facility] NURSE [facility] MFC-US  07/20/2019  7:30 AM [facility] [place] 1 WH-M[facility]US MFC-US  08/04/2019 11:15 AM Short, Cristine Polio, RD NDM-[facility] NDM    Coral Ceo, MD Center for [facility], [facility] 07/07/2019

## 2019-07-07 NOTE — Discharge Instructions (Signed)
Estreimiento, en adultos Constipation, Adult Estreimiento significa que una persona defeca en una semana menos que lo normal, tiene dificultad para defecar, o las heces son secas, duras, o ms grandes que lo normal. El estreimiento podra estar provocado por una enfermedad preexistente. Puede empeorar con la edad si una persona toma ciertos medicamentos y no toma suficiente lquido. Siga estas indicaciones en su casa: Qu debe comer y beber   Consuma alimentos con alto contenido de fibra, como frutas y verduras frescas, cereales integrales y frijoles.  Limite los alimentos ricos en grasas y con bajo contenido de fibra, o muy procesados, como las papas fritas, hamburguesas, galletas, dulces y refrescos.  Beba suficiente lquido para mantener la orina clara o de color amarillo plido. Instrucciones generales  Haga actividad fsica habitualmente o como se lo haya indicado el mdico.  Vaya al bao cuando sienta la necesidad de ir. No se aguante las ganas.  Tome los medicamentos de venta libre y los recetados solamente como se lo haya indicado el mdico. Estos incluyen los suplementos de fibra.  Practique ejercicios de rehabilitacin del suelo plvico, como la respiracin profunda mientras relaja la parte inferior del abdomen y la relajacin del suelo plvico mientras defeca.  Controle su afeccin para detectar cualquier cambio.  Concurra a todas las visitas de seguimiento como se lo haya indicado el mdico. Esto es importante. Comunquese con un mdico si:  Su dolor empeora.  Tiene fiebre.  No defeca despus de 4das.  Vomita.  No tiene hambre.  Pierde peso.  Tiene una hemorragia en el ano.  Las heces son delgadas como un lpiz. Solicite ayuda de inmediato si:  Tiene fiebre y los sntomas empeoran repentinamente.  Observa que se filtran heces o hay sangre en las heces.  Tiene el abdomen distendido.  Siente un dolor intenso en el abdomen.  Se siente mareado o se  desmaya. Esta informacin no tiene como fin reemplazar el consejo del mdico. Asegrese de hacerle al mdico cualquier pregunta que tenga. Document Revised: 07/02/2016 Document Reviewed: 08/31/2015 Elsevier Patient Education  2020 Elsevier Inc.  

## 2019-07-07 NOTE — MAU Note (Signed)
Pain in left flank area.  Started 2 wks ago, last 4 days has been worse. Noted increase in urination,  Denies any pain or bleeding with urination.  No GI problems.

## 2019-07-08 LAB — CULTURE, OB URINE

## 2019-07-20 ENCOUNTER — Other Ambulatory Visit: Payer: Self-pay

## 2019-07-20 ENCOUNTER — Other Ambulatory Visit (HOSPITAL_COMMUNITY): Payer: Self-pay | Admitting: *Deleted

## 2019-07-20 ENCOUNTER — Encounter (HOSPITAL_COMMUNITY): Payer: Self-pay

## 2019-07-20 ENCOUNTER — Ambulatory Visit (HOSPITAL_COMMUNITY)
Admission: RE | Admit: 2019-07-20 | Discharge: 2019-07-20 | Disposition: A | Discharge status: Home / Self Care | Payer: Medicaid Other | Source: Ambulatory Visit | Attending: Obstetrics and Gynecology | Admitting: Obstetrics and Gynecology

## 2019-07-20 ENCOUNTER — Ambulatory Visit (HOSPITAL_COMMUNITY): Payer: Medicaid Other | Admitting: *Deleted

## 2019-07-20 VITALS — BP 127/64 | HR 96 | Temp 97.8°F

## 2019-07-20 DIAGNOSIS — R772 Abnormality of alphafetoprotein: Secondary | ICD-10-CM

## 2019-07-20 DIAGNOSIS — E669 Obesity, unspecified: Secondary | ICD-10-CM | POA: Diagnosis not present

## 2019-07-20 DIAGNOSIS — O99212 Obesity complicating pregnancy, second trimester: Secondary | ICD-10-CM | POA: Diagnosis not present

## 2019-07-20 DIAGNOSIS — Z362 Encounter for other antenatal screening follow-up: Secondary | ICD-10-CM

## 2019-07-20 DIAGNOSIS — Z3A21 21 weeks gestation of pregnancy: Secondary | ICD-10-CM

## 2019-07-20 DIAGNOSIS — O289 Unspecified abnormal findings on antenatal screening of mother: Secondary | ICD-10-CM | POA: Diagnosis not present

## 2019-07-20 DIAGNOSIS — O0990 Supervision of high risk pregnancy, unspecified, unspecified trimester: Secondary | ICD-10-CM

## 2019-07-20 DIAGNOSIS — Z148 Genetic carrier of other disease: Secondary | ICD-10-CM

## 2019-07-28 ENCOUNTER — Ambulatory Visit (INDEPENDENT_AMBULATORY_CARE_PROVIDER_SITE_OTHER): Payer: Medicaid Other | Admitting: Obstetrics & Gynecology

## 2019-07-28 ENCOUNTER — Other Ambulatory Visit: Payer: Self-pay

## 2019-07-28 VITALS — BP 120/82 | HR 105 | Wt 261.9 lb

## 2019-07-28 DIAGNOSIS — Z98891 History of uterine scar from previous surgery: Secondary | ICD-10-CM

## 2019-07-28 DIAGNOSIS — Z3480 Encounter for supervision of other normal pregnancy, unspecified trimester: Secondary | ICD-10-CM

## 2019-07-28 MED ORDER — BUTALBITAL-APAP-CAFFEINE 50-325-40 MG PO TABS: 1.0000 | ORAL_TABLET | Freq: Two times a day (BID) | ORAL | 0 refills | Status: DC | PRN

## 2019-07-28 NOTE — Patient Instructions (Signed)
Parto vaginal despus de un parto por cesrea Vaginal Birth After Cesarean Delivery  Un parto vaginal despus de un parto por cesrea (PVDC) es dar a luz por la vagina despus de haber dado a luz por medio de una intervencin quirrgica llamada cesrea. Es posible que el PVDC sea una alternativa segura para usted, segn su salud y otros factores. Es importante que hable con su mdico acerca del PVDC desde comienzos del embarazo de modo que pueda comprender los riesgos, beneficios y opciones. Hablar sobre estos temas desde el comienzo le permitir tener tiempo para planificar el parto. Quines son las mejores candidatas para tener un PVDC? Las mejores candidatas para tener un PVDC son las mujeres que cumplen con los siguientes requisitos:  Tuvieron uno o dos partos por cesrea anteriores, y las incisiones realizadas durante los partos fueron horizontales (transversales bajas).  No tienen una cicatriz uterina vertical (clsica).  No han tenido una ruptura en la pared del tero (ruptura uterina).  Piensan tener otros embarazos. Es ms probable que un PVDC se realice correctamente en los siguientes casos:  Mujeres que ya tuvieron partos vaginales antes.  Cuando el trabajo de parto comienza sin ninguna intervencin (de forma espontnea) antes de la fecha prevista. Cules son los beneficios del PVDC? Algunos de los beneficios de que el beb nazca por parto vaginal en lugar de por cesrea:  Estada ms corta en el hospital.  Menor tiempo de recuperacin.  Menos dolor.  Evitar los riesgos asociados a las cirugas mayores, como las infecciones y los cogulos de sangre.  Menor prdida de sangre y menor probabilidad de necesitar sangre de un donante (transfusin). Cules son los riesgos del PVDC? El principal riesgo de intentar tener un PVDC es que existe una posibilidad de que falle, lo que obligar al mdico a realizar una cesrea. Los otros riesgos son muy poco frecuentes y pueden ser  los siguientes:  Desgarro (ruptura) de la cicatriz de un parto por cesrea anterior.  Otros riesgos asociados con los partos vaginales. Si se debe repetir un parto por cesrea, los riesgos incluyen los siguientes:  Prdida de sangre.  Infeccin.  Cogulos de sangre.  Lesin en los rganos circundantes.  Extirpacin del tero (histerectoma), si se daa.  Problemas de placenta en embarazos futuros. Qu ms debo saber sobre las alternativas? El parto vaginal despus de una cesrea es similar a un parto espontneo vaginal normal. Por lo tanto, es seguro:  Intentarlo cuando se trata de gemelos.  Que el mdico intente girar al beb si se encuentra de nalgas (versin ceflica externa) durante el trabajo de parto.  Colocar anestesia epidural para aliviar el dolor. Considerar posibilidades sobre dnde le gustara que ocurriera el parto. El PVDC debe llevarse a cabo en centros donde se pueda realizar un parto por cesrea de urgencia. No se recomiendan los partos domiciliarios si planea tener un PVDC. Cualquier cambio en su salud o la de su beb durante el embarazo puede ser motivo de un cambio de decisin respecto del parto vaginal. El mdico podra recomendarle no realizar un PVDC en los siguientes casos:  El beb parece pesar 8.8lb (4kg) o ms.  Tiene preeclampsia. Esta es una afeccin que provoca el aumento de la presin arterial y otros sntomas, como hinchazn y dolores de cabeza.  Tendr un PVDC menos de 19meses despus de un parto por cesrea.  Ya pas la fecha prevista del parto.  Necesita que se inicie el trabajo de parto (induccin) porque el cuello uterino no est listo para   el parto (desfavorable). Dnde encontrar ms informacin  Asociacin Americana del Embarazo (American Pregnancy Association): americanpregnancy.org.  Congreso Estadounidense de Obstetras y Gineclogos (American College of Obstetricians and Gynecologists): acog.org Resumen  Un parto vaginal  despus de un parto por cesrea (PVDC) es dar a luz por la vagina despus de haber dado a luz por medio de una intervencin quirrgica llamada cesrea. Es posible que el PVDC sea una alternativa segura para usted, segn su salud y otros factores.  Hable con el mdico desde comienzos del embarazo para que pueda comprender los riesgos, los beneficios y las alternativas, y tener mucho tiempo para planificar el parto.  El principal riesgo de intentar tener un PVDC es que existe una posibilidad de que falle, lo que obligar al mdico a realizar una cesrea. Los otros riesgos son muy poco frecuentes. Esta informacin no tiene como fin reemplazar el consejo del mdico. Asegrese de hacerle al mdico cualquier pregunta que tenga. Document Revised: 10/16/2016 Document Reviewed: 10/16/2016 Elsevier Patient Education  2020 Elsevier Inc.  

## 2019-07-28 NOTE — Progress Notes (Signed)
   PRENATAL VISIT NOTE  Subjective:  Lisa Herrera is a 24 y.o. G2P1001 at [redacted]w[redacted]d being seen today for ongoing prenatal care.  She is currently monitored for the following issues for this high-risk pregnancy and has Maternal varicella, non-immune; S/P cesarean section for arrest of descent; Supervision of other normal pregnancy, antepartum; Nausea and vomiting during pregnancy prior to [redacted] weeks gestation; Gastroesophageal reflux disease with esophagitis without hemorrhage; Obesity affecting pregnancy in first trimester; Hx of cholelithiasis; Alpha thalassemia silent carrier; and Elevated AFP on their problem list.  Patient reports headache.  Contractions: Not present. Vag. Bleeding: None.  Movement: Present. Denies leaking of fluid.   The following portions of the patient's history were reviewed and updated as appropriate: allergies, current medications, past family history, past medical history, past social history, past surgical history and problem list.   Objective:   Vitals:   07/28/19 0913  BP: 120/82  Pulse: (!) 105  Weight: 261 lb 14.4 oz (118.8 kg)    Fetal Status: Fetal Heart Rate (bpm): 145   Movement: Present     General:  Alert, oriented and cooperative. Patient is in no acute distress.  Skin: Skin is warm and dry. No rash noted.   Cardiovascular: Normal heart rate noted  Respiratory: Normal respiratory effort, no problems with respiration noted  Abdomen: Soft, gravid, appropriate for gestational age.  Pain/Pressure: Absent     Pelvic: Cervical exam deferred        Extremities: Normal range of motion.     Mental Status: Normal mood and affect. Normal behavior. Normal judgment and thought content.   Assessment and Plan:  Pregnancy: G2P1001 at [redacted]w[redacted]d 1. Supervision of other normal pregnancy, antepartum  Headache for 6 days, Tylenol doesn't help. Nl head CT 02/2018 done for headache, Rx for TTHA sent - butalbital-acetaminophen-caffeine (ESGIC) 50-325-40 MG tablet; Take 1-2  tablets by mouth 2 (two) times daily as needed for headache.  Dispense: 14 tablet; Refill: 0  2. S/P cesarean section for arrest of descent Information for VBAC today, considering RCS   Preterm labor symptoms and general obstetric precautions including but not limited to vaginal bleeding, contractions, leaking of fluid and fetal movement were reviewed in detail with the patient. Please refer to After Visit Summary for other counseling recommendations.   Return in about 4 weeks (around 08/25/2019) for 2 hr GTT.  Future Appointments  Date Time Provider Department Center  08/03/2019  9:30 AM Hermina Staggers, MD [facility] None  08/04/2019 11:15 AM Malachi Bonds Cristine Polio, RD [facility] NDM  08/31/2019 10:45 AM [facility] NURSE [facility]  08/31/2019 10:45 AM [facility] US4 [facility]    Scheryl Darter, MD

## 2019-07-28 NOTE — Progress Notes (Signed)
Patient reports fetal movement. Pt reports ongoing headache today that is not relieved by Tylenol.

## 2019-08-03 ENCOUNTER — Ambulatory Visit (INDEPENDENT_AMBULATORY_CARE_PROVIDER_SITE_OTHER): Payer: Medicaid Other | Admitting: Obstetrics and Gynecology

## 2019-08-03 ENCOUNTER — Encounter: Payer: Self-pay | Admitting: Obstetrics and Gynecology

## 2019-08-03 ENCOUNTER — Other Ambulatory Visit: Payer: Self-pay

## 2019-08-03 VITALS — BP 113/72 | HR 102 | Temp 98.6°F | Wt 265.9 lb

## 2019-08-03 DIAGNOSIS — D563 Thalassemia minor: Secondary | ICD-10-CM

## 2019-08-03 DIAGNOSIS — Z3480 Encounter for supervision of other normal pregnancy, unspecified trimester: Secondary | ICD-10-CM

## 2019-08-03 DIAGNOSIS — Z98891 History of uterine scar from previous surgery: Secondary | ICD-10-CM

## 2019-08-03 DIAGNOSIS — Z283 Underimmunization status: Secondary | ICD-10-CM

## 2019-08-03 DIAGNOSIS — R772 Abnormality of alphafetoprotein: Secondary | ICD-10-CM

## 2019-08-03 DIAGNOSIS — O09899 Supervision of other high risk pregnancies, unspecified trimester: Secondary | ICD-10-CM

## 2019-08-03 NOTE — Progress Notes (Signed)
ROB c/o breast and abdominal  pain 4-5/10 x 5+

## 2019-08-03 NOTE — Patient Instructions (Signed)
Segundo trimestre de embarazo Second Trimester of Pregnancy  El segundo trimestre va desde la semana14 hasta la 27 (desde el mes 4 hasta el 6). Este suele ser el momento en el que mejor se siente. En general, las nuseas matutinas han disminuido o han desaparecido completamente. Tendr ms energa y podr aumentarle el apetito. El beb en gestacin se desarrolla rpidamente. Hacia el final del sexto mes, el beb mide aproximadamente 9 pulgadas (23 cm) y pesa alrededor de 1 libras (700 g). Es probable que sienta al beb moverse entre las 18 y 20 semanas del embarazo. Siga estas indicaciones en su casa: Medicamentos  Tome los medicamentos de venta libre y los recetados solamente como se lo haya indicado el mdico. Algunos medicamentos son seguros para tomar durante el embarazo y otros no lo son.  Tome vitaminas prenatales que contengan por lo menos 600microgramos (?g) de cido flico.  Si tiene dificultad para mover el intestino (estreimiento), tome un medicamento para ablandar las heces (laxante) si su mdico se lo autoriza. Comida y bebida   Ingiera alimentos saludables de manera regular.  No coma carne cruda ni quesos sin cocinar.  Si obtiene poca cantidad de calcio de los alimentos que ingiere, consulte a su mdico sobre la posibilidad de tomar un suplemento diario de calcio.  Evite el consumo de alimentos ricos en grasas y azcares, como los alimentos fritos y los dulces.  Si tiene malestar estomacal (nuseas) o devuelve (vomita): ? Ingiera 4 o 5comidas pequeas por da en lugar de 3abundantes. ? Intente comer algunas galletitas saladas. ? Beba lquidos entre las comidas, en lugar de hacerlo durante estas.  Para evitar el estreimiento: ? Consuma alimentos ricos en fibra, como frutas y verduras frescas, cereales integrales y frijoles. ? Beba suficiente lquido para mantener el pis (orina) claro o de color amarillo plido. Actividad  Haga ejercicios solamente como se lo haya  indicado el mdico. Interrumpa la actividad fsica si comienza a tener calambres.  No haga ejercicio si hace demasiado calor, hay demasiada humedad o se encuentra en un lugar de mucha altura (altitud alta).  Evite levantar pesos excesivos.  Use zapatos con tacones bajos. Mantenga una buena postura al sentarse y pararse.  Puede continuar teniendo relaciones sexuales, a menos que el mdico le indique lo contrario. Alivio del dolor y del malestar  Use un sostn que le brinde buen soporte si sus mamas estn sensibles.  Dese baos de asiento con agua tibia para aliviar el dolor o las molestias causadas por las hemorroides. Use una crema para las hemorroides si el mdico la autoriza.  Descanse con las piernas elevadas si tiene calambres o dolor de cintura.  Si desarrolla venas hinchadas y abultadas (vrices) en las piernas: ? Use medias de compresin o medias de descanso como se lo haya indicado el mdico. ? Levante (eleve) los pies durante 15minutos, 3 o 4veces por da. ? Limite el consumo de sal en sus alimentos. Cuidado prenatal  Escriba sus preguntas. Llvelas cuando concurra a las visitas prenatales.  Concurra a todas las visitas prenatales como se lo haya indicado el mdico. Esto es importante. Seguridad  Colquese el cinturn de seguridad cuando conduzca.  Haga una lista de los nmeros de telfono de emergencia, que incluya los nmeros de telfono de familiares, amigos, el hospital, as como los departamentos de polica y bomberos. Instrucciones generales  Consulte a su mdico sobre los alimentos que debe comer o pdale que la ayude a encontrar a quien pueda aconsejarla si necesita ese servicio.    Consulte a su mdico acerca de dnde se dictan clases prenatales cerca de donde vive. Comience las clases antes del mes 6 de embarazo.  No se d baos de inmersin en agua caliente, baos turcos ni saunas.  No se haga duchas vaginales ni use tampones o toallas higinicas perfumadas.   No mantenga las piernas cruzadas durante mucho tiempo.  Vaya al dentista si an no lo hizo. Use un cepillo de cerdas suaves para cepillarse los dientes. Psese el hilo dental suavemente.  No fume, no consuma hierbas ni beba alcohol. No tome frmacos que el mdico no haya autorizado.  No consuma ningn producto que contenga nicotina o tabaco, como cigarrillos y cigarrillos electrnicos. Si necesita ayuda para dejar de fumar, consulte al mdico.  Evite el contacto con las bandejas sanitarias de los gatos y la tierra que estos animales usan. Estos elementos contienen bacterias que pueden causar defectos congnitos al beb y la posible prdida del beb (aborto espontneo) o la muerte fetal. Comunquese con un mdico si:  Tiene clicos leves o siente presin en la parte baja del vientre.  Tiene dolor al hacer pis (orinar).  Advierte un lquido con olor ftido que proviene de la vagina.  Tiene malestar estomacal (nuseas), devuelve (vomita) o tiene deposiciones acuosas (diarrea).  Sufre un dolor persistente en el abdomen.  Siente mareos. Solicite ayuda de inmediato si:  Tiene fiebre.  Tiene una prdida de lquido por la vagina.  Tiene sangrado o pequeas prdidas vaginales.  Siente dolor intenso o clicos en el abdomen.  Sube o baja de peso rpidamente.  Tiene dificultades para recuperar el aliento y siente dolor en el pecho.  Sbitamente se le hinchan mucho el rostro, las manos, los tobillos, los pies o las piernas.  No ha sentido los movimientos del beb durante una hora.  Siente un dolor de cabeza intenso que no se alivia al tomar medicamentos.  Tiene dificultad para ver. Resumen  El segundo trimestre va desde la semana14 hasta la 27, desde el mes 4 hasta el 6. Este suele ser el momento en el que mejor se siente.  Para cuidarse y cuidar a su beb en gestacin, debe comer alimentos saludables, tomar medicamentos solamente si su mdico le indica que lo haga y hacer  actividades que sean seguras para usted y su beb.  Llame al mdico si se enferma o si nota algo inusual acerca de su embarazo. Tambin llame al mdico si necesita ayuda para saber qu alimentos debe comer o si quiere saber qu actividades puede realizar de forma segura. Esta informacin no tiene como fin reemplazar el consejo del mdico. Asegrese de hacerle al mdico cualquier pregunta que tenga. Document Revised: 12/05/2016 Document Reviewed: 12/05/2016 Elsevier Patient Education  2020 [employer]  

## 2019-08-03 NOTE — Progress Notes (Signed)
Subjective:  Lisa Herrera is a 24 y.o. G2P1001 at 109w6d being seen today for work in appt d/t to c/o breast and abd pain. Pt reports breast throughout pregnancy. Reports normal breast U/S in March. Denies trauma or injury. No nipple discharge. And pain is around umbilicus. Comes and goes. Has neen there for over 2 weeks now. Denies vaginal bleeding, LOF or ut ctx. Denies any bowel or bladder dysfunction.   She is currently monitored for the following issues for this low-risk pregnancy and has Maternal varicella, non-immune; S/P cesarean section for arrest of descent; Supervision of other normal pregnancy, antepartum; Gastroesophageal reflux disease with esophagitis without hemorrhage; Obesity affecting pregnancy in first trimester; Hx of cholelithiasis; Alpha thalassemia silent carrier; and Elevated AFP on their problem list.  Patient reports as noted above..  Contractions: Not present. Vag. Bleeding: None.  Movement: Present. Denies leaking of fluid.   The following portions of the patient's history were reviewed and updated as appropriate: allergies, current medications, past family history, past medical history, past social history, past surgical history and problem list. Problem list updated.  Objective:   Vitals:   08/03/19 0919  BP: 113/72  Pulse: (!) 102  Temp: 98.6 F (37 C)  Weight: 265 lb 14.4 oz (120.6 kg)    Fetal Status: Fetal Heart Rate (bpm): 148 Fundal Height: 24 cm Movement: Present     General:  Alert, oriented and cooperative. Patient is in no acute distress.  Skin: Skin is warm and dry. No rash noted.   Cardiovascular: Normal heart rate noted  Respiratory: Normal respiratory effort, no problems with respiration noted  Abdomen: Soft, gravid, appropriate for gestational age. Pain/Pressure: Present   Umbilicus normal ? Small hernia  Pelvic:  Cervical exam deferred        Extremities: Normal range of motion.  Edema: Trace  Mental Status: Normal mood and affect. Normal  behavior. Normal judgment and thought content.  Breast sym supple no masses, nipple discharge or adenopathy  Urinalysis:      Assessment and Plan:  Pregnancy: G2P1001 at [redacted]w[redacted]d  1. Supervision of other normal pregnancy, antepartum Stable Pt reassured in regards to her concerns that are to be expected with pregnancy. PTL precautions reviewed with pt. Glucola next visit   2. S/P cesarean section for arrest of descent   3. Elevated AFP U/S follow up as per MFM  4. Alpha thalassemia silent carrier   5. Maternal varicella, non-immune Vaccine PP  Preterm labor symptoms and general obstetric precautions including but not limited to vaginal bleeding, contractions, leaking of fluid and fetal movement were reviewed in detail with the patient. Please refer to After Visit Summary for other counseling recommendations.  No follow-ups on file.   Hermina Staggers, MD

## 2019-08-04 ENCOUNTER — Other Ambulatory Visit: Payer: Self-pay

## 2019-08-04 ENCOUNTER — Encounter: Payer: Self-pay | Admitting: Dietician

## 2019-08-04 ENCOUNTER — Encounter: Payer: Medicaid Other | Attending: Advanced Practice Midwife | Admitting: Dietician

## 2019-08-04 ENCOUNTER — Telehealth: Payer: Medicaid Other | Admitting: Obstetrics and Gynecology

## 2019-08-04 DIAGNOSIS — O99210 Obesity complicating pregnancy, unspecified trimester: Secondary | ICD-10-CM | POA: Diagnosis present

## 2019-08-04 NOTE — Progress Notes (Signed)
Medical Nutrition Therapy   Primary concerns today: weight management during pregnancy   Referral diagnosis: O99.211- obesity affecting pregnancy in first trimester  Preferred learning style: no preference indicated Learning readiness: change in progress   NUTRITION ASSESSMENT   Lifestyle & Dietary Hx Interpreter present during today's visit. Patient states she is concerned about being overweight during pregnancy and is worried about developing gestational diabetes. States her weight has increased 5 lbs since her previous doctor appointment, and that overall she has experienced greater weight gain throughout pregnancy than with her prior pregnancy. States that she began changing her diet 2 weeks ago, at which time she started eating more salads and chicken soup. Typical meal pattern is 3 meals plus maybe 1 snack per day. Drinks only water, daily intake is adequate.   Estimated daily fluid intake: 6 bottles water per day  Supplements: prenatal MVI + folic acid  Sleep: does not sleep well  Current average weekly physical activity: pregnancy-friendly exercises   24-Hr Dietary Recall First Meal: Cheerios cereal + whole milk Snack: nuts Second Meal: spicy [place] food Snack: - Third Meal: salad  Snack: - Beverages: water   NUTRITION DIAGNOSIS  Increased nutrient needs (NI-5.1) related to pregnancy as evidenced by [redacted] weeks gestation and research recommendations regarding higher fiber, protein, and micronutrient needs during pregnancy.    NUTRITION INTERVENTION  Nutrition education (E-1) on the following topics:  . Nutrition and weight management during pregnancy   Handouts Provided Include   Spanish Pregnancy Nutrition   Spanish MyPlate  Learning Style & Readiness for Change Teaching method utilized: Visual & Auditory  Demonstrated degree of understanding via: Teach Back  Barriers to learning/adherence to lifestyle change: None Identified    MONITORING & EVALUATION Dietary  intake, weekly physical activity, and weight PRN.  Next Steps  Patient is to contact [facility] for follow up as needed. Patient may reach out via phone/email at any time with questions or concerns.

## 2019-08-31 ENCOUNTER — Other Ambulatory Visit: Payer: Self-pay

## 2019-08-31 ENCOUNTER — Ambulatory Visit (HOSPITAL_BASED_OUTPATIENT_CLINIC_OR_DEPARTMENT_OTHER): Payer: Medicaid Other

## 2019-08-31 ENCOUNTER — Ambulatory Visit (HOSPITAL_COMMUNITY): Payer: Medicaid Other | Attending: Obstetrics and Gynecology | Admitting: *Deleted

## 2019-08-31 ENCOUNTER — Other Ambulatory Visit: Payer: Self-pay | Admitting: *Deleted

## 2019-08-31 VITALS — BP 115/66 | HR 86

## 2019-08-31 DIAGNOSIS — Z362 Encounter for other antenatal screening follow-up: Secondary | ICD-10-CM | POA: Diagnosis not present

## 2019-08-31 DIAGNOSIS — Z3A27 27 weeks gestation of pregnancy: Secondary | ICD-10-CM | POA: Diagnosis not present

## 2019-08-31 DIAGNOSIS — E669 Obesity, unspecified: Secondary | ICD-10-CM

## 2019-08-31 DIAGNOSIS — O99212 Obesity complicating pregnancy, second trimester: Secondary | ICD-10-CM

## 2019-08-31 DIAGNOSIS — O34219 Maternal care for unspecified type scar from previous cesarean delivery: Secondary | ICD-10-CM | POA: Diagnosis not present

## 2019-08-31 DIAGNOSIS — O280 Abnormal hematological finding on antenatal screening of mother: Secondary | ICD-10-CM | POA: Diagnosis not present

## 2019-08-31 DIAGNOSIS — O289 Unspecified abnormal findings on antenatal screening of mother: Secondary | ICD-10-CM

## 2019-08-31 DIAGNOSIS — Z148 Genetic carrier of other disease: Secondary | ICD-10-CM | POA: Diagnosis not present

## 2019-08-31 DIAGNOSIS — R772 Abnormality of alphafetoprotein: Secondary | ICD-10-CM

## 2019-09-01 ENCOUNTER — Ambulatory Visit (INDEPENDENT_AMBULATORY_CARE_PROVIDER_SITE_OTHER): Payer: Medicaid Other | Admitting: Obstetrics and Gynecology

## 2019-09-01 ENCOUNTER — Encounter: Payer: Self-pay | Admitting: Obstetrics and Gynecology

## 2019-09-01 ENCOUNTER — Other Ambulatory Visit: Payer: Medicaid Other

## 2019-09-01 VITALS — BP 105/70 | HR 101 | Wt 267.0 lb

## 2019-09-01 DIAGNOSIS — Z98891 History of uterine scar from previous surgery: Secondary | ICD-10-CM

## 2019-09-01 DIAGNOSIS — Z3A28 28 weeks gestation of pregnancy: Secondary | ICD-10-CM

## 2019-09-01 DIAGNOSIS — D563 Thalassemia minor: Secondary | ICD-10-CM

## 2019-09-01 DIAGNOSIS — Z789 Other specified health status: Secondary | ICD-10-CM | POA: Insufficient documentation

## 2019-09-01 DIAGNOSIS — O34219 Maternal care for unspecified type scar from previous cesarean delivery: Secondary | ICD-10-CM

## 2019-09-01 DIAGNOSIS — O99211 Obesity complicating pregnancy, first trimester: Secondary | ICD-10-CM

## 2019-09-01 DIAGNOSIS — Z3480 Encounter for supervision of other normal pregnancy, unspecified trimester: Secondary | ICD-10-CM

## 2019-09-01 DIAGNOSIS — R772 Abnormality of alphafetoprotein: Secondary | ICD-10-CM

## 2019-09-01 DIAGNOSIS — O99013 Anemia complicating pregnancy, third trimester: Secondary | ICD-10-CM

## 2019-09-01 NOTE — Progress Notes (Signed)
   PRENATAL VISIT NOTE  Subjective:  Lisa Herrera is a 24 y.o. G2P1001 at [redacted]w[redacted]d being seen today for ongoing prenatal care.  She is currently monitored for the following issues for this high-risk pregnancy and has Maternal varicella, non-immune; S/P cesarean section for arrest of descent; Supervision of other normal pregnancy, antepartum; Gastroesophageal reflux disease with esophagitis without hemorrhage; Obesity affecting pregnancy in first trimester; Hx of cholelithiasis; Alpha thalassemia silent carrier; Elevated AFP; and Language barrier on their problem list.  Patient reports occasional contractions and pelvic pressure. Stil with headache, not taking fioricet prescribed. Contractions: Not present. Vag. Bleeding: None.  Movement: Present. Denies leaking of fluid.   The following portions of the patient's history were reviewed and updated as appropriate: allergies, current medications, past family history, past medical history, past social history, past surgical history and problem list.   Objective:   Vitals:   09/01/19 0847  BP: 105/70  Pulse: (!) 101  Weight: 267 lb (121.1 kg)    Fetal Status: Fetal Heart Rate (bpm): 150   Movement: Present     General:  Alert, oriented and cooperative. Patient is in no acute distress.  Skin: Skin is warm and dry. No rash noted.   Cardiovascular: Normal heart rate noted  Respiratory: Normal respiratory effort, no problems with respiration noted  Abdomen: Soft, gravid, appropriate for gestational age.  Pain/Pressure: Present     Pelvic: Cervical exam deferred        Extremities: Normal range of motion.  Edema: None  Mental Status: Normal mood and affect. Normal behavior. Normal judgment and thought content.   Assessment and Plan:  Pregnancy: G2P1001 at [redacted]w[redacted]d  1. Supervision of other normal pregnancy, antepartum - Glucose Tolerance, 2 Hours w/1 Hour - CBC - HIV antibody (with reflex) - RPR - encouraged increased fluid intake to improve  migraines and use fioricet as needed - recommend Tdap, she wants to discuss with her spouse  2. S/P cesarean section for arrest of descent - Reports she had counseling last visit, was given info about TOLAC vs RCS and would like to have RCS - consent signed today - message sent to scheduler for RCS  3. Obesity affecting pregnancy in first trimester  4. Alpha thalassemia silent carrier S/p genetic counseling  5. Elevated AFP US wnl  6. Language barrier Engineer, structural used  Preterm labor symptoms and general obstetric precautions including but not limited to vaginal bleeding, contractions, leaking of fluid and fetal movement were reviewed in detail with the patient. Please refer to After Visit Summary for other counseling recommendations.   Return in about 2 weeks (around 09/15/2019) for high OB, in person.  Future Appointments  Date Time Provider Department Center  09/15/2019  9:45 AM Warden Fillers, MD [facility] None  09/29/2019  2:30 PM [facility] NURSE [facility]  09/29/2019  2:30 PM [facility] US3 [facility]    Conan Bowens, MD

## 2019-09-01 NOTE — Progress Notes (Signed)
ROB.  Declined TDAP.  She stopped taking the Flexeril because it does not work.

## 2019-09-02 LAB — CBC
Hematocrit: 36.6 % (ref 34.0–46.6)
Hemoglobin: 11.5 g/dL (ref 11.1–15.9)
MCH: 28.6 pg (ref 26.6–33.0)
MCHC: 31.4 g/dL — ABNORMAL LOW (ref 31.5–35.7)
MCV: 91 fL (ref 79–97)
Platelets: 268 10*3/uL (ref 150–450)
RBC: 4.02 x10E6/uL (ref 3.77–5.28)
RDW: 12.3 % (ref 11.7–15.4)
WBC: 8.8 10*3/uL (ref 3.4–10.8)

## 2019-09-02 LAB — HIV ANTIBODY (ROUTINE TESTING W REFLEX): HIV Screen 4th Generation wRfx: NONREACTIVE

## 2019-09-02 LAB — GLUCOSE TOLERANCE, 2 HOURS W/ 1HR
Glucose, 1 hour: 144 mg/dL (ref 65–179)
Glucose, 2 hour: 123 mg/dL (ref 65–152)
Glucose, Fasting: 85 mg/dL (ref 65–91)

## 2019-09-02 LAB — RPR: RPR Ser Ql: NONREACTIVE

## 2019-09-15 ENCOUNTER — Ambulatory Visit (INDEPENDENT_AMBULATORY_CARE_PROVIDER_SITE_OTHER): Payer: Medicaid Other | Admitting: Obstetrics and Gynecology

## 2019-09-15 ENCOUNTER — Encounter: Payer: Self-pay | Admitting: Obstetrics and Gynecology

## 2019-09-15 ENCOUNTER — Other Ambulatory Visit: Payer: Self-pay

## 2019-09-15 VITALS — BP 132/80 | HR 92 | Wt 269.6 lb

## 2019-09-15 DIAGNOSIS — E669 Obesity, unspecified: Secondary | ICD-10-CM

## 2019-09-15 DIAGNOSIS — Z283 Underimmunization status: Secondary | ICD-10-CM

## 2019-09-15 DIAGNOSIS — O09899 Supervision of other high risk pregnancies, unspecified trimester: Secondary | ICD-10-CM

## 2019-09-15 DIAGNOSIS — Z789 Other specified health status: Secondary | ICD-10-CM

## 2019-09-15 DIAGNOSIS — O34219 Maternal care for unspecified type scar from previous cesarean delivery: Secondary | ICD-10-CM

## 2019-09-15 DIAGNOSIS — Z23 Encounter for immunization: Secondary | ICD-10-CM | POA: Diagnosis not present

## 2019-09-15 DIAGNOSIS — O99213 Obesity complicating pregnancy, third trimester: Secondary | ICD-10-CM

## 2019-09-15 DIAGNOSIS — D563 Thalassemia minor: Secondary | ICD-10-CM

## 2019-09-15 DIAGNOSIS — Z3480 Encounter for supervision of other normal pregnancy, unspecified trimester: Secondary | ICD-10-CM

## 2019-09-15 DIAGNOSIS — R772 Abnormality of alphafetoprotein: Secondary | ICD-10-CM

## 2019-09-15 DIAGNOSIS — O288 Other abnormal findings on antenatal screening of mother: Secondary | ICD-10-CM

## 2019-09-15 DIAGNOSIS — Z148 Genetic carrier of other disease: Secondary | ICD-10-CM

## 2019-09-15 DIAGNOSIS — O09893 Supervision of other high risk pregnancies, third trimester: Secondary | ICD-10-CM

## 2019-09-15 DIAGNOSIS — Z3A30 30 weeks gestation of pregnancy: Secondary | ICD-10-CM

## 2019-09-15 DIAGNOSIS — Z98891 History of uterine scar from previous surgery: Secondary | ICD-10-CM

## 2019-09-15 NOTE — Progress Notes (Signed)
Pt is here for ROB, [redacted]w[redacted]d.   T-Dap today.

## 2019-09-15 NOTE — Progress Notes (Signed)
° °  PRENATAL VISIT NOTE  Subjective:  Lisa Herrera is a 24 y.o. G2P1001 at [redacted]w[redacted]d being seen today for ongoing prenatal care.  She is currently monitored for the following issues for this high-risk pregnancy and has Maternal varicella, non-immune; S/P cesarean section for arrest of descent; Supervision of other normal pregnancy, antepartum; Gastroesophageal reflux disease with esophagitis without hemorrhage; Obesity affecting pregnancy in third trimester, antepartum; Hx of cholelithiasis; Alpha thalassemia silent carrier; Elevated AFP; and Language barrier on their problem list.  Patient doing well with no acute concerns today. She reports mild pelvic pressure.  Contractions: Not present. Vag. Bleeding: None.  Movement: Present. Denies leaking of fluid.   The following portions of the patient's history were reviewed and updated as appropriate: allergies, current medications, past family history, past medical history, past social history, past surgical history and problem list. Problem list updated.  Objective:   Vitals:   09/15/19 1020  BP: 132/80  Pulse: 92  Weight: 269 lb 9.6 oz (122.3 kg)   Breast exam:  Left breast, no masses or skin changes noted Fetal Status: Fetal Heart Rate (bpm): 150   Movement: Present     General:  Alert, oriented and cooperative. Patient is in no acute distress.  Skin: Skin is warm and dry. No rash noted.   Cardiovascular: Normal heart rate noted  Respiratory: Normal respiratory effort, no problems with respiration noted  Abdomen: Soft, gravid, appropriate for gestational age.  Pain/Pressure: Present     Pelvic: Cervical exam deferred        Extremities: Normal range of motion.  Edema: None  Mental Status:  Normal mood and affect. Normal behavior. Normal judgment and thought content.   Assessment and Plan:  Pregnancy: G2P1001 at [redacted]w[redacted]d  1. Maternal varicella, non-immune   2. S/P cesarean section for arrest of descent Pt still desires repeat c section,  she is considering BTL and will discuss with her spouse.  She has not put too much thought into getting BTL and is not sure about salpingectomy or other technique.  3. Supervision of other normal pregnancy, antepartum Pt requests tdap today  4. Elevated AFP   5. Alpha thalassemia silent carrier   6. Language barrier Interpreter present  7. Obesity affecting pregnancy in third trimester, antepartum   Preterm labor symptoms and general obstetric precautions including but not limited to vaginal bleeding, contractions, leaking of fluid and fetal movement were reviewed in detail with the patient.  Please refer to After Visit Summary for other counseling recommendations.   Return in about 2 weeks (around 09/29/2019) for [facility].   Mariel Aloe, MD

## 2019-09-22 ENCOUNTER — Other Ambulatory Visit: Payer: Self-pay | Admitting: Family Medicine

## 2019-09-29 ENCOUNTER — Other Ambulatory Visit: Payer: Self-pay

## 2019-09-29 ENCOUNTER — Encounter: Payer: Self-pay | Admitting: *Deleted

## 2019-09-29 ENCOUNTER — Ambulatory Visit (INDEPENDENT_AMBULATORY_CARE_PROVIDER_SITE_OTHER): Payer: Medicaid Other | Admitting: Obstetrics and Gynecology

## 2019-09-29 ENCOUNTER — Ambulatory Visit: Payer: Medicaid Other | Attending: Obstetrics and Gynecology

## 2019-09-29 ENCOUNTER — Ambulatory Visit: Payer: Medicaid Other | Admitting: *Deleted

## 2019-09-29 VITALS — BP 104/71 | HR 93 | Wt 270.7 lb

## 2019-09-29 DIAGNOSIS — Z148 Genetic carrier of other disease: Secondary | ICD-10-CM

## 2019-09-29 DIAGNOSIS — O99213 Obesity complicating pregnancy, third trimester: Secondary | ICD-10-CM | POA: Diagnosis not present

## 2019-09-29 DIAGNOSIS — Z362 Encounter for other antenatal screening follow-up: Secondary | ICD-10-CM

## 2019-09-29 DIAGNOSIS — Z98891 History of uterine scar from previous surgery: Secondary | ICD-10-CM

## 2019-09-29 DIAGNOSIS — O0993 Supervision of high risk pregnancy, unspecified, third trimester: Secondary | ICD-10-CM

## 2019-09-29 DIAGNOSIS — O289 Unspecified abnormal findings on antenatal screening of mother: Secondary | ICD-10-CM | POA: Diagnosis not present

## 2019-09-29 DIAGNOSIS — Z3480 Encounter for supervision of other normal pregnancy, unspecified trimester: Secondary | ICD-10-CM

## 2019-09-29 DIAGNOSIS — O3663X0 Maternal care for excessive fetal growth, third trimester, not applicable or unspecified: Secondary | ICD-10-CM

## 2019-09-29 DIAGNOSIS — O34219 Maternal care for unspecified type scar from previous cesarean delivery: Secondary | ICD-10-CM | POA: Diagnosis not present

## 2019-09-29 DIAGNOSIS — Z3A32 32 weeks gestation of pregnancy: Secondary | ICD-10-CM

## 2019-09-29 DIAGNOSIS — E669 Obesity, unspecified: Secondary | ICD-10-CM

## 2019-09-29 NOTE — Progress Notes (Signed)
   PRENATAL VISIT NOTE  Subjective:  Lisa Herrera is a 24 y.o. G2P1001 at [redacted]w[redacted]d being seen today for ongoing prenatal care.  She is currently monitored for the following issues for this high-risk pregnancy and has Maternal varicella, non-immune; S/P cesarean section for arrest of descent; Supervision of other normal pregnancy, antepartum; Gastroesophageal reflux disease with esophagitis without hemorrhage; Obesity affecting pregnancy in third trimester, antepartum; Hx of cholelithiasis; Alpha thalassemia silent carrier; Elevated AFP; and Language barrier on their problem list.  Patient reports no complaints.  Contractions: Irritability. Vag. Bleeding: None.  Movement: Present. Denies leaking of fluid.   The following portions of the patient's history were reviewed and updated as appropriate: allergies, current medications, past family history, past medical history, past social history, past surgical history and problem list.   Objective:   Vitals:   09/29/19 0844  BP: 104/71  Pulse: 93  Weight: 122.8 kg    Fetal Status: Fetal Heart Rate (bpm): 135 Fundal Height: 36 cm Movement: Present     General:  Alert, oriented and cooperative. Patient is in no acute distress.  Skin: Skin is warm and dry. No rash noted.   Cardiovascular: Normal heart rate noted  Respiratory: Normal respiratory effort, no problems with respiration noted  Abdomen: Soft, gravid, appropriate for gestational age.  Pain/Pressure: Present     Pelvic: Cervical exam deferred        Extremities: Normal range of motion.  Edema: Trace  Mental Status: Normal mood and affect. Normal behavior. Normal judgment and thought content.   Assessment and Plan:  Pregnancy: G2P1001 at [redacted]w[redacted]d There are no diagnoses linked to this encounter. Preterm labor symptoms and general obstetric precautions including but not limited to vaginal bleeding, contractions, leaking of fluid and fetal movement were reviewed in detail with the patient.  Please refer to After Visit Summary for other counseling recommendations.   Return in 2 weeks (on 10/13/2019) for ROB.  Future Appointments  Date Time Provider Department Center  09/29/2019  2:30 PM [facility] NURSE [facility]  09/29/2019  2:30 PM [facility] US3 [facility]  10/13/2019  8:45 AM Brock Bad, MD [facility] None    Johnny Bridge, MD

## 2019-09-29 NOTE — Progress Notes (Signed)
Pt is here for high risk OB visit, [redacted]w[redacted]d.

## 2019-09-29 NOTE — Progress Notes (Signed)
Fundal height greater than dates.  Will add growth to ultrasound already scheduled for today.  Contraception options discussed.  Patient desires copper IUD.

## 2019-09-30 ENCOUNTER — Other Ambulatory Visit: Payer: Self-pay | Admitting: *Deleted

## 2019-09-30 DIAGNOSIS — O99213 Obesity complicating pregnancy, third trimester: Secondary | ICD-10-CM

## 2019-10-05 ENCOUNTER — Encounter (HOSPITAL_COMMUNITY): Payer: Self-pay | Admitting: Obstetrics & Gynecology

## 2019-10-05 ENCOUNTER — Inpatient Hospital Stay (HOSPITAL_COMMUNITY)
Admission: AD | Admit: 2019-10-05 | Discharge: 2019-10-05 | Disposition: A | Discharge status: Home / Self Care | Payer: Medicaid Other | Attending: Obstetrics & Gynecology | Admitting: Obstetrics & Gynecology

## 2019-10-05 ENCOUNTER — Other Ambulatory Visit: Payer: Self-pay

## 2019-10-05 DIAGNOSIS — R030 Elevated blood-pressure reading, without diagnosis of hypertension: Secondary | ICD-10-CM | POA: Insufficient documentation

## 2019-10-05 DIAGNOSIS — M7989 Other specified soft tissue disorders: Secondary | ICD-10-CM | POA: Insufficient documentation

## 2019-10-05 DIAGNOSIS — R519 Headache, unspecified: Secondary | ICD-10-CM | POA: Insufficient documentation

## 2019-10-05 DIAGNOSIS — O26893 Other specified pregnancy related conditions, third trimester: Secondary | ICD-10-CM | POA: Diagnosis present

## 2019-10-05 DIAGNOSIS — Z711 Person with feared health complaint in whom no diagnosis is made: Secondary | ICD-10-CM | POA: Insufficient documentation

## 2019-10-05 DIAGNOSIS — O99213 Obesity complicating pregnancy, third trimester: Secondary | ICD-10-CM | POA: Insufficient documentation

## 2019-10-05 DIAGNOSIS — E669 Obesity, unspecified: Secondary | ICD-10-CM | POA: Diagnosis not present

## 2019-10-05 DIAGNOSIS — Z3A32 32 weeks gestation of pregnancy: Secondary | ICD-10-CM | POA: Insufficient documentation

## 2019-10-05 LAB — URINALYSIS, ROUTINE W REFLEX MICROSCOPIC
Bilirubin Urine: NEGATIVE
Glucose, UA: NEGATIVE mg/dL
Hgb urine dipstick: NEGATIVE
Ketones, ur: NEGATIVE mg/dL
Leukocytes,Ua: NEGATIVE
Nitrite: NEGATIVE
Protein, ur: NEGATIVE mg/dL
Specific Gravity, Urine: 1.006 (ref 1.005–1.030)
pH: 7 (ref 5.0–8.0)

## 2019-10-05 LAB — COMPREHENSIVE METABOLIC PANEL
ALT: 11 U/L (ref 0–44)
AST: 13 U/L — ABNORMAL LOW (ref 15–41)
Albumin: 2.3 g/dL — ABNORMAL LOW (ref 3.5–5.0)
Alkaline Phosphatase: 107 U/L (ref 38–126)
Anion gap: 8 (ref 5–15)
BUN: <5 mg/dL — ABNORMAL LOW (ref 6–20)
CO2: 22 mmol/L (ref 22–32)
Calcium: 8.6 mg/dL — ABNORMAL LOW (ref 8.9–10.3)
Chloride: 108 mmol/L (ref 98–111)
Creatinine, Ser: 0.48 mg/dL (ref 0.44–1.00)
GFR calc Af Amer: >60 mL/min (ref >60)
GFR calc non Af Amer: >60 mL/min (ref >60)
Glucose, Bld: 106 mg/dL — ABNORMAL HIGH (ref 70–99)
Potassium: 3.9 mmol/L (ref 3.5–5.1)
Sodium: 138 mmol/L (ref 135–145)
Total Bilirubin: 0.3 mg/dL (ref 0.3–1.2)
Total Protein: 5.4 g/dL — ABNORMAL LOW (ref 6.5–8.1)

## 2019-10-05 LAB — CBC
HCT: 34.4 % — ABNORMAL LOW (ref 36.0–46.0)
Hemoglobin: 10.8 g/dL — ABNORMAL LOW (ref 12.0–15.0)
MCH: 27.8 pg (ref 26.0–34.0)
MCHC: 31.4 g/dL (ref 30.0–36.0)
MCV: 88.4 fL (ref 80.0–100.0)
Platelets: 237 10*3/uL (ref 150–400)
RBC: 3.89 MIL/uL (ref 3.87–5.11)
RDW: 13 % (ref 11.5–15.5)
WBC: 8.4 10*3/uL (ref 4.0–10.5)
nRBC: 0 % (ref 0.0–0.2)

## 2019-10-05 LAB — PROTEIN / CREATININE RATIO, URINE
Creatinine, Urine: 36.79 mg/dL
Total Protein, Urine: <6 mg/dL

## 2019-10-05 MED ORDER — COMFORT FIT MATERNITY SUPP LG MISC: 1.0000 | Freq: Every day | 0 refills | Status: DC

## 2019-10-05 NOTE — MAU Provider Note (Signed)
Chief Complaint:  Hypertension and Headache   First Provider Initiated Contact with Patient 10/05/19 1835     HPI: Lisa Herrera is a 24 y.o. G2P1001 at [redacted]w[redacted]d who presents to maternity admissions reporting occasional HA (none now), swelling in hands and feet, increased BP today (using home cuff and then in MD office). Was outside all weekend at baseball games and other events, FOB says she was not hydrating enough. Denies vaginal bleeding, leaking of fluid, decreased fetal movement, fever, falls, or recent illness.   Pregnancy Course: Obesity affecting pregnancy, increased risk of pre-e (on aspirin), alpha thalassemia carrier (genetic counseled), varicella non-immune.  Past Medical History:  Diagnosis Date  . Gallstones   . Migraine    OB History  Gravida Para Term Preterm AB Living  2 1 1     1   SAB TAB Ectopic Multiple Live Births        0 1    # Outcome Date GA Lbr Len/2nd Weight Sex Delivery Anes PTL Lv  2 Current           1 Term 01/14/16 [redacted]w[redacted]d / 02:47 3245 g F CS-LTranv EPI  LIV    Obstetric Comments  C/S due to breech baby    Past Surgical History:  Procedure Laterality Date  . CESAREAN SECTION N/A 01/14/2016   Procedure: CESAREAN SECTION;  Surgeon: 01/16/2016, MD;  Location: [facility];  Service: Obstetrics;  Laterality: N/A;  . CHOLECYSTECTOMY     Family History  Problem Relation Age of Onset  . Breast cancer Maternal Aunt        49's   Social History   Tobacco Use  . Smoking status: Never Smoker  . Smokeless tobacco: Never Used  Vaping Use  . Vaping Use: Never used  Substance Use Topics  . Alcohol use: No    Alcohol/week: 0.0 standard drinks  . Drug use: No   No Known Allergies No medications prior to admission.    I have reviewed patient's Past Medical Hx, Surgical Hx, Family Hx, Social Hx, medications and allergies.   ROS:  Review of Systems  Constitutional: Negative.   HENT: Negative.   Eyes: Negative for photophobia and visual  disturbance.  Respiratory: Negative for shortness of breath.   Cardiovascular: Negative for chest pain and palpitations.  Gastrointestinal: Negative for abdominal pain, constipation, diarrhea, nausea and vomiting.  Endocrine: Negative.   Genitourinary: Negative for pelvic pain, vaginal bleeding, vaginal discharge and vaginal pain.  Musculoskeletal: Negative.   Skin: Negative.   Allergic/Immunologic: Negative.   Neurological: Positive for headaches (occasional and mild, go away with Tylenol). Negative for dizziness, syncope and light-headedness.  Hematological: Negative.   Psychiatric/Behavioral: Negative.     Physical Exam   Patient Vitals for the past 24 hrs:  BP Temp Temp src Pulse Resp SpO2 Weight  10/05/19 2016 129/83 -- -- 79 -- -- --  10/05/19 2001 119/65 -- -- 82 -- -- --  10/05/19 1930 118/64 -- -- 77 -- -- --  10/05/19 1915 120/61 -- -- 86 -- -- --  10/05/19 1900 (!) 119/58 -- -- 84 -- -- --  10/05/19 1845 124/69 -- -- 90 -- -- --  10/05/19 1830 117/69 -- -- 85 -- -- --  10/05/19 1815 (!) 108/58 -- -- 92 -- -- --  10/05/19 1800 (!) 109/51 -- -- 94 -- -- --  10/05/19 1738 140/73 98.7 F (37.1 C) Oral 86 20 100 % 125.7 kg    Constitutional: Well-developed, well-nourished female  in no acute distress.  Cardiovascular: normal rate & rhythm, no murmur Respiratory: normal effort, lung sounds clear throughout GI: Abd soft, non-tender, gravid appropriate for gestational age. Pos BS x 4 MS: Extremities nontender, mild non-pitting edema in hands and feet, normal ROM Neurologic: Alert and oriented x 4.  Spec & Pelvic exam deferred  Fetal Tracing: reactive Baseline: 145 Variability: moderate Accelerations: + Decelerations: none Toco: none, feeling some mild BH   Labs: Results for orders placed or performed during the hospital encounter of 10/05/19 (from the past 24 hour(s))  Urinalysis, Routine w reflex microscopic     Status: Abnormal   Collection Time: 10/05/19  6:30 PM   Result Value Ref Range   Color, Urine STRAW (A) YELLOW   APPearance CLEAR CLEAR   Specific Gravity, Urine 1.006 1.005 - 1.030   pH 7.0 5.0 - 8.0   Glucose, UA NEGATIVE NEGATIVE mg/dL   Hgb urine dipstick NEGATIVE NEGATIVE   Bilirubin Urine NEGATIVE NEGATIVE   Ketones, ur NEGATIVE NEGATIVE mg/dL   Protein, ur NEGATIVE NEGATIVE mg/dL   Nitrite NEGATIVE NEGATIVE   Leukocytes,Ua NEGATIVE NEGATIVE  Protein / creatinine ratio, urine     Status: None   Collection Time: 10/05/19  6:41 PM  Result Value Ref Range   Creatinine, Urine 36.79 mg/dL   Total Protein, Urine <6 mg/dL   Protein Creatinine Ratio        0.00 - 0.15 mg/mg[Cre]  CBC     Status: Abnormal   Collection Time: 10/05/19  7:25 PM  Result Value Ref Range   WBC 8.4 4.0 - 10.5 K/uL   RBC 3.89 3.87 - 5.11 MIL/uL   Hemoglobin 10.8 (L) 12.0 - 15.0 g/dL   HCT 23.5 (L) 36 - 46 %   MCV 88.4 80.0 - 100.0 fL   MCH 27.8 26.0 - 34.0 pg   MCHC 31.4 30.0 - 36.0 g/dL   RDW 57.3 22.0 - 25.4 %   Platelets 237 150 - 400 K/uL   nRBC 0.0 0.0 - 0.2 %  Comprehensive metabolic panel     Status: Abnormal   Collection Time: 10/05/19  7:25 PM  Result Value Ref Range   Sodium 138 135 - 145 mmol/L   Potassium 3.9 3.5 - 5.1 mmol/L   Chloride 108 98 - 111 mmol/L   CO2 22 22 - 32 mmol/L   Glucose, Bld 106 (H) 70 - 99 mg/dL   BUN <5 (L) 6 - 20 mg/dL   Creatinine, Ser 2.70 0.44 - 1.00 mg/dL   Calcium 8.6 (L) 8.9 - 10.3 mg/dL   Total Protein 5.4 (L) 6.5 - 8.1 g/dL   Albumin 2.3 (L) 3.5 - 5.0 g/dL   AST 13 (L) 15 - 41 U/L   ALT 11 0 - 44 U/L   Alkaline Phosphatase 107 38 - 126 U/L   Total Bilirubin 0.3 0.3 - 1.2 mg/dL   GFR calc non Af Amer >60 >60 mL/min   GFR calc Af Amer >60 >60 mL/min   Anion gap 8 5 - 15    Imaging:  None  MAU Course: Orders Placed This Encounter  Procedures  . Urinalysis, Routine w reflex microscopic  . CBC  . Comprehensive metabolic panel  . Protein / creatinine ratio, urine  . Discharge patient   Meds  ordered this encounter  Medications  . Elastic Bandages & Supports (COMFORT FIT MATERNITY SUPP LG) MISC    Sig: 1 each by Does not apply route daily.    Dispense:  1 each    Refill:  0    Order Specific Question:   Supervising Provider    Answer:   Nettie Elm L [[id number]]    MDM: Pt very concerned about preeclampsia despite not having any elevated pressures in MAU, and no headache or other severe features.  Discussed etiology of pre-e and prevention methods, encouraged fluids, and discussed healthy habits and nutrition in pregnancy.   Assessment: 1. Nonintractable headache, unspecified chronicity pattern, unspecified headache type   2. Worried well   3. Elevated BP without diagnosis of hypertension       Follow-up Information    [facility] Follow up.   Specialty: Obstetrics and Gynecology Why: as needed, if symptoms worsen  Contact information: [address] [id number] [place] [address] [phone]               1. Nonintractable headache, unspecified chronicity pattern, unspecified headache type   2. Worried well   3. Elevated BP without diagnosis of hypertension      P:  Discharge home in stable condition Message sent to [facility] for BP check this week. Patient to bring cuff to office Pre E precautions Rx: pregnancy support belt   Elman Dettman, Harolyn Rutherford, NP 10/05/2019 9:12 PM

## 2019-10-05 NOTE — MAU Note (Signed)
HA since Friday,  Has taken Tylenol, some relief.  Denies visual changes, epigastric pain. Reports increase in swelling in hands and feet.   Denies bleeding or LOF

## 2019-10-05 NOTE — Discharge Instructions (Signed)
Round Ligament Pain During Pregnancy   Round ligament pain is a sharp pain or jabbing feeling often felt in the lower belly or groin area on one or both sides. It is one of the most common complaints during pregnancy and is considered a normal part of pregnancy. It is most often felt during the second trimester.   Here is what you need to know about round ligament pain, including some tips to help you feel better.   Causes of Round Ligament Pain:    Several thick ligaments surround and support your womb (uterus) as it grows during pregnancy. One of them is called the round ligament.   The round ligament connects the front part of the womb to your groin, the area where your legs attach to your pelvis. The round ligament normally tightens and relaxes slowly.   As your baby and womb grow, the round ligament stretches. That makes it more likely to become strained.   Sudden movements can cause the ligament to tighten quickly, like a rubber band snapping. This causes a sudden and quick jabbing feeling.   Symptoms of Round Ligament Pain   Round ligament pain can be concerning and uncomfortable. But it is considered normal as your body changes during pregnancy.   The symptoms of round ligament pain include a sharp, sudden spasm in the belly. It usually affects the right side, but it may happen on both sides. The pain only lasts a few seconds.   Exercise may cause the pain, as will rapid movements such as:   sneezing  coughing  laughing  rolling over in bed  standing up too quickly   Treatment of Round Ligament Pain   Here are some tips that may help reduce your discomfort:   Pain relief. Take over-the-counter acetaminophen for pain, if necessary. Ask your doctor if this is OK.   Exercise. Get plenty of exercise to keep your stomach (core) muscles strong. Doing stretching exercises or prenatal yoga can be helpful. Ask your doctor which exercises are safe for you and your baby.   A  helpful exercise involves putting your hands and knees on the floor, lowering your head, and pushing your backside into the air.   Avoid sudden movements. Change positions slowly (such as standing up or sitting down) to avoid sudden movements that may cause stretching and pain.   Flex your hips. Bend and flex your hips before you cough, sneeze, or laugh to avoid pulling on the ligaments.   Apply warmth. A heating pad or warm bath may be helpful. Ask your doctor if this is OK. Extreme heat can be dangerous to the baby.   You should try to modify your daily activity level and avoid positions that may worsen the condition.   When to Call the Doctor/Midwife   Always tell your doctor or midwife about any type of pain you have during pregnancy. Round ligament pain is quick and doesn't last long.   Call your health care provider immediately if you have:   severe pain  fever  chills  pain on urination  difficulty walking   Belly pain during pregnancy can be due to many different causes. It is important for your doctor to rule out more serious conditions, including pregnancy complications such as placenta abruption or non-pregnancy illnesses such as:   inguinal hernia  appendicitis  stomach, liver, and kidney problems  Preterm labor pains may sometimes be mistaken for round ligament pain.  PREGNANCY SUPPORT BELT: You are not alone, Seventy-five percent of women have some sort of abdominal or back pain at some point in their pregnancy. Your baby is growing at a fast pace, which means that your whole body is rapidly trying to adjust to the changes. As your uterus grows, your back may start feeling a bit under stress and this can result in back or abdominal pain that can go from mild, and therefore bearable, to severe pains that will not allow you to sit or lay down comfortably, When it comes to dealing with pregnancy-related pains and cramps, some pregnant women usually prefer  natural remedies, which the market is filled with nowadays. For example, wearing a pregnancy support belt can help ease and lessen your discomfort and pain. WHAT ARE THE BENEFITS OF WEARING A PREGNANCY SUPPORT BELT? A pregnancy support belt provides support to the lower portion of the belly taking some of the weight of the growing uterus and distributing to the other parts of your body. It is designed make you comfortable and gives you extra support. Over the years, the pregnancy apparel market has been studying the needs and wants of pregnant women and they have come up with the most comfortable pregnancy support belts that woman could ever ask for. In fact, you will no longer have to wear a stretched-out or bulky pregnancy belt that is visible underneath your clothes and makes you feel even more uncomfortable. Nowadays, a pregnancy support belt is made of comfortable and stretchy materials that will not irritate your skin but will actually make you feel at ease and you will not even notice you are wearing it. They are easy to put on and adjust during the day and can be worn at night for additional support.  BENEFITS: . Relives Back pain . Relieves Abdominal Muscle and Leg Pain . Stabilizes the Pelvic Ring . Offers a Cushioned Abdominal Lift Pad . Relieves pressure on the Sciatic Nerve Within Minutes WHERE TO GET YOUR PREGNANCY BELT: [employer] [phone] @[phone]  [address] [place], [place]

## 2019-10-06 ENCOUNTER — Telehealth: Payer: Self-pay

## 2019-10-06 NOTE — Telephone Encounter (Signed)
Spoke w/ pt on 10/05/19 regarding B/P pt went to MAU as advised. Per Venia Carbon pt needs B/P check appt in office this week pt info and message given to front desk scheduling to contact pt for appt.

## 2019-10-08 ENCOUNTER — Ambulatory Visit (HOSPITAL_BASED_OUTPATIENT_CLINIC_OR_DEPARTMENT_OTHER): Payer: Medicaid Other

## 2019-10-08 ENCOUNTER — Other Ambulatory Visit: Payer: Self-pay

## 2019-10-08 VITALS — BP 106/68 | HR 80 | Wt 275.6 lb

## 2019-10-08 DIAGNOSIS — Z0130 Encounter for examination of blood pressure without abnormal findings: Secondary | ICD-10-CM

## 2019-10-08 DIAGNOSIS — O0990 Supervision of high risk pregnancy, unspecified, unspecified trimester: Secondary | ICD-10-CM

## 2019-10-08 NOTE — Progress Notes (Signed)
Pt is here for BP check, [redacted]w[redacted]d. Pt was advised by MAU provider to bring in home BP cuff today to compare to our BP cuff in office. When attempting to use pt's home BP cuff, it kept reading error. When using office BP cuff, the patient's BP is normal. I advised pt and pt's husband to return BP cuff to [facility] due to it malfunctioning and get a replacement. Pt also c/o pressure, denies contractions. I advised pt to pick up maternity support belt that was sent for her and to try tylenol as directed for pain and to let provider know if that is not helping next week at her appt. Pt voices understanding.

## 2019-10-13 ENCOUNTER — Encounter: Payer: Self-pay | Admitting: Obstetrics

## 2019-10-13 ENCOUNTER — Ambulatory Visit (INDEPENDENT_AMBULATORY_CARE_PROVIDER_SITE_OTHER): Payer: Medicaid Other | Admitting: Obstetrics

## 2019-10-13 ENCOUNTER — Other Ambulatory Visit: Payer: Self-pay

## 2019-10-13 VITALS — BP 109/74 | HR 94 | Wt 277.0 lb

## 2019-10-13 DIAGNOSIS — R030 Elevated blood-pressure reading, without diagnosis of hypertension: Secondary | ICD-10-CM

## 2019-10-13 DIAGNOSIS — Z603 Acculturation difficulty: Secondary | ICD-10-CM

## 2019-10-13 DIAGNOSIS — O281 Abnormal biochemical finding on antenatal screening of mother: Secondary | ICD-10-CM

## 2019-10-13 DIAGNOSIS — O3663X0 Maternal care for excessive fetal growth, third trimester, not applicable or unspecified: Secondary | ICD-10-CM

## 2019-10-13 DIAGNOSIS — Z98891 History of uterine scar from previous surgery: Secondary | ICD-10-CM

## 2019-10-13 DIAGNOSIS — Z789 Other specified health status: Secondary | ICD-10-CM

## 2019-10-13 DIAGNOSIS — O99213 Obesity complicating pregnancy, third trimester: Secondary | ICD-10-CM

## 2019-10-13 DIAGNOSIS — E669 Obesity, unspecified: Secondary | ICD-10-CM

## 2019-10-13 DIAGNOSIS — O0990 Supervision of high risk pregnancy, unspecified, unspecified trimester: Secondary | ICD-10-CM

## 2019-10-13 DIAGNOSIS — O0993 Supervision of high risk pregnancy, unspecified, third trimester: Secondary | ICD-10-CM

## 2019-10-13 DIAGNOSIS — Z3A34 34 weeks gestation of pregnancy: Secondary | ICD-10-CM

## 2019-10-13 DIAGNOSIS — R772 Abnormality of alphafetoprotein: Secondary | ICD-10-CM

## 2019-10-13 NOTE — Progress Notes (Signed)
ROB   CC: pain in hands from swelling , braxton hicks contractions, palpitations fast heart beat, this past weekend while walking.  Pt also notes Friday and Sat she had a stomach bug.had Nausea and diarrhea feeling better today,

## 2019-10-13 NOTE — Progress Notes (Signed)
Subjective:  Lisa Herrera is a 24 y.o. G2P1001 at [redacted]w[redacted]d being seen today for ongoing prenatal care.  She is currently monitored for the following issues for this high-risk pregnancy and has Maternal varicella, non-immune; S/P cesarean section for arrest of descent; Supervision of other normal pregnancy, antepartum; Gastroesophageal reflux disease with esophagitis without hemorrhage; Obesity affecting pregnancy in third trimester, antepartum; Hx of cholelithiasis; Alpha thalassemia silent carrier; Elevated AFP; and Language barrier on their problem list.  Patient reports occasional contractions and swelling of hands.  Contractions: Irritability. Vag. Bleeding: None.  Movement: Present. Denies leaking of fluid.   The following portions of the patient's history were reviewed and updated as appropriate: allergies, current medications, past family history, past medical history, past social history, past surgical history and problem list. Problem list updated.  Objective:   Vitals:   10/13/19 0902  BP: 109/74  Pulse: 94  Weight: 277 lb (125.6 kg)    Fetal Status:     Movement: Present     General:  Alert, oriented and cooperative. Patient is in no acute distress.  Skin: Skin is warm and dry. No rash noted.   Cardiovascular: Normal heart rate noted  Respiratory: Normal respiratory effort, no problems with respiration noted  Abdomen: Soft, gravid, appropriate for gestational age. Pain/Pressure: Present     Pelvic:  Cervical exam deferred        Extremities: Normal range of motion.  Edema: Trace  Mental Status: Normal mood and affect. Normal behavior. Normal judgment and thought content.   Urinalysis:      Assessment and Plan:  Pregnancy: G2P1001 at [redacted]w[redacted]d  1. Supervision of high risk pregnancy, antepartum  2. Elevated AFP  3. S/P cesarean section for arrest of descent - wants repeat C/S and BTL - tubal papers signed today  4. Elevated BP without diagnosis of hypertension - clinically  stable  5. Obesity affecting pregnancy in third trimester  6. Excessive fetal growth affecting management of pregnancy in third trimester, single or unspecified fetus - serial ultrasounds for growth  7. Language barrier - interpreter present   Preterm labor symptoms and general obstetric precautions including but not limited to vaginal bleeding, contractions, leaking of fluid and fetal movement were reviewed in detail with the patient. Please refer to After Visit Summary for other counseling recommendations.   Return in about 1 week (around 10/20/2019) for [facility].   Brock Bad, MD  10/13/19

## 2019-10-21 ENCOUNTER — Encounter: Payer: Self-pay | Admitting: Obstetrics & Gynecology

## 2019-10-21 ENCOUNTER — Ambulatory Visit (INDEPENDENT_AMBULATORY_CARE_PROVIDER_SITE_OTHER): Payer: Medicaid Other | Admitting: Obstetrics & Gynecology

## 2019-10-21 ENCOUNTER — Other Ambulatory Visit: Payer: Self-pay

## 2019-10-21 VITALS — BP 106/73 | HR 92 | Wt 277.0 lb

## 2019-10-21 DIAGNOSIS — Z3A35 35 weeks gestation of pregnancy: Secondary | ICD-10-CM

## 2019-10-21 DIAGNOSIS — Z98891 History of uterine scar from previous surgery: Secondary | ICD-10-CM

## 2019-10-21 DIAGNOSIS — Z3480 Encounter for supervision of other normal pregnancy, unspecified trimester: Secondary | ICD-10-CM

## 2019-10-21 DIAGNOSIS — Z3483 Encounter for supervision of other normal pregnancy, third trimester: Secondary | ICD-10-CM

## 2019-10-21 DIAGNOSIS — D563 Thalassemia minor: Secondary | ICD-10-CM

## 2019-10-21 NOTE — Patient Instructions (Addendum)
La lactancia y el cuidado personal Breastfeeding and Self-Care Al comenzar a Museum/gallery exhibitions officer al nuevo beb, es normal que surjan algunos problemas. Sin embargo, hay ciertas cosas que puede hacer para cuidarse y ayudar a prevenir muchos problemas frecuentes. Por ejemplo, debe mantener sus mamas sanas y asegurarse de que el beb agarre bien el pezn con su boca (se prenda) cuando lo amamante. Consulte a su mdico o a Games developer (asesor en Market researcher) para Financial risk analyst qu es lo que mejor funciona en su caso. Siga estas indicaciones en su casa: Estrategia para la lactancia   Asegrese siempre de que el beb se prenda bien para Museum/gallery exhibitions officer.  Asegrese de que el beb est en una posicin adecuada. Pruebe diferentes posiciones para Copy una que funcione tanto para usted como para el beb.  Amamante al beb cuando muestre signos de hambre o si siente la necesidad de Chief Financial Officer. Esto se denomina "lactancia a demanda".  No retrase los horarios para Museum/gallery exhibitions officer.  Intente relajarse cuando sea la hora de alimentar al beb. De este [place], ayuda a que el cuerpo libere la [place] de sus [place].  Para ayudar a que aumente el flujo de [place]: ? Squese una pequea cantidad de [place] de las mamas justo antes de Museum/gallery exhibitions officer. Para hacerlo, use un sacaleches o use las manos. ? Aplquese calor hmedo en las mamas justo antes de [place]. Puede hacerlo en la ducha o con toallas de mano humedecidas con agua tibia. ? Hgase masajes en las mamas justo antes de amamantar o [place]. Cuidado de las mamas   Para ayudar a Pharmacologist sus mamas sanas y Automotive engineer que se resequen, haga lo siguiente: ? Evite usar [employer]. ? Seque al [employer] pezones durante 3 o despus de amamantar al beb. ? Utilice solo discos absorbentes de algodn en el sostn para Insurance account manager [employer] se filtre. Asegrese de [employer] Wholesale si se empapan con [place]. Si [place] discos en el  sostn que se pueden Technical brewer, cmbielos con frecuencia. ? Pngase lanolina sobre los pezones luego de Museum/gallery exhibitions officer. Si [place] lanolina pura, no tiene que lavarse los pezones antes de alimentar al beb nuevamente. La lanolina pura no es perjudicial para el beb. ? Frtese los pezones con [employer] materna:  Saque con la mano algunas gotas de [place].  Masajee suavemente la [employer].  Deje secar los pezones al aire.  Use un sostn para amamantar. Evite usar: ? Ropa ajustada. ? Sostenes con aro o sostenes que [employer].  Aplquese hielo para Engineer, materials o la inflamacin de las mamas: ? Ponga el hielo en una bolsa plstica. ? Coloque una First[employer] piel y la bolsa de hielo. ? Coloque el hielo durante , 2 a 3veces por da. Instrucciones generales  Beba suficiente lquido para mantener la orina de color amarillo plido.  Descanse lo suficiente. Duerma mientras el beb duerme.  Consulte al mdico o a un especialista en lactancia antes de tomar suplementos a base de hierbas. Comunquese con un mdico si:  Tiene dolor en los pezones.  Presenta agrietamiento o irritacin en los pezones durante ms de 1semana.  Tiene las mamas llenas de leche en exceso (congestin mamaria) durante ms de 48 horas.  Tiene fiebre.  Le sale un lquido parecido al pus del pezn.  Presenta enrojecimiento, una erupcin, hinchazn, picazn o ardor en las mamas.  Su beb no aumenta de peso.  Su beb pierde peso. Resumen  Hay ciertas cosas que puede hacer para cuidarse y ayudar a prevenir muchos problemas frecuentes de Patent examiner.  Asegrese siempre de que el beb agarre bien el pezn con su boca (se prenda) cuando lo amamante.  Evite que sus pezones se resequen, beba mucho lquido y descanse lo suficiente.  Amamante al beb a demanda. No retrase los horarios para Museum/gallery exhibitions officer. Esta informacin no tiene Theme park manager el consejo del mdico. Asegrese de  hacerle al mdico cualquier pregunta que tenga. Document Revised: 12/06/2016 Document Reviewed: 12/06/2016 Elsevier Patient Education  2020 Elsevier Inc. Systems analyst trimestre de Psychiatrist Third Trimester of Pregnancy  El tercer trimestre comprende desde la [employer] la semana40 (desde el mes7 hasta el mes9). En este trimestre, el beb en gestacin (feto) crece muy rpidamente. Hacia el final del noveno mes, el beb en gestacin mide alrededor de 20pulgadas (45cm) de largo. Pesa entre 6y 10libras [phone]). Siga estas indicaciones en su casa: Medicamentos  [employer] de venta libre y los recetados solamente como se lo haya indicado el mdico. Algunos medicamentos son seguros para tomar durante el Psychiatrist y otros no lo son.  Tome vitaminas prenatales que contengan por lo menos (?g) de cido flico.  Si tiene dificultad para mover el intestino (estreimiento), tome un medicamento para ablandar las heces (laxante) si su mdico se lo autoriza. Comida y bebida   Ingiera alimentos saludables de [place] regular.  No coma carne cruda ni quesos sin cocinar.  Si obtiene poca cantidad de calcio de los alimentos que ingiere, consulte a su mdico sobre la posibilidad de tomar un suplemento diario de calcio.  La ingesta diaria de cuatro o cinco comidas pequeas en lugar de tres comidas abundantes.  Evite el consumo de alimentos ricos en grasas y azcares, como los alimentos fritos y los dulces.  Para evitar el estreimiento: ? Consuma alimentos ricos en fibra, como frutas y verduras frescas, cereales integrales y frijoles. ? Beba suficiente lquido para mantener el pis (orina) claro o de color amarillo plido. Actividad  Haga ejercicios solamente como se lo haya indicado el mdico. Interrumpa la actividad fsica si comienza a tener calambres.  No levante objetos pesados, use zapatos de tacones bajos y sintese derecha.  No haga ejercicio si hace  demasiado calor, hay demasiada humedad o se encuentra en un lugar de mucha altura (altitud alta).  Puede continuar teniendo [employer] [place], a menos que el mdico le indique lo contrario. Alivio del dolor y del Dentist  Use un sostn que le brinde buen soporte si sus mamas estn sensibles.  Haga pausas frecuentes y descanse con las piernas levantadas si tiene calambres en las piernas o dolor en la zona lumbar.  Dese baos de asiento con agua tibia para Engineer, materials o las molestias causadas por las hemorroides. Use una crema para las hemorroides si el mdico la autoriza.  Si desarrolla venas hinchadas y abultadas (vrices) en las piernas: ? Use medias de compresin o medias de descanso como se lo haya indicado el mdico. ? Levante (eleve) los pies durante , 3 o 4veces por Futures trader. ? Limite el consumo de sal en sus alimentos. Seguridad  [employer] cinturn de seguridad cuando conduzca.  Haga una lista de los nmeros de telfono de Associate Professor, que [employer] nmeros de telfono de familiares, amigos, [facility], as como los departamentos de polica y bomberos. Preparacin para la llegada del beb Para prepararse para la llegada de su beb:  Tome clases prenatales.  Practique ir [employer]  al hospital.  [facility] y recorra el rea de maternidad.  Hable en su trabajo acerca de tomar licencia cuando llegue el beb.  Prepare el bolso que llevar al hospital.  Prepare la habitacin del beb.  Concurra a los controles mdicos.  Compre un asiento de seguridad [employer] atrs para llevar al beb en el automvil. Aprenda cmo instalarlo en el auto. Instrucciones generales  No se d baos de inmersin en agua caliente, baos turcos ni saunas.  No consuma ningn producto que contenga nicotina o tabaco, como cigarrillos y Administrator, Civil Service. Si necesita ayuda para dejar de fumar, consulte al mdico.  No beba alcohol.  No se haga duchas vaginales  ni use tampones o toallas higinicas perfumadas.  No mantenga las piernas cruzadas durante [place].  No haga viajes de larga distancia, excepto si es obligatorio. Hgalos solamente si su mdico la autoriza.  Visite a su dentista si no lo ha Occupational hygienist. Use un cepillo de cerdas suaves para cepillarse los dientes. Psese el hilo dental con suavidad.  Evite el contacto con las bandejas sanitarias de los gatos y la tierra que estos animales usan. Estos elementos contienen bacterias que pueden causar defectos congnitos al beb y la posible prdida del beb (aborto espontneo) o la muerte fetal.  Concurra a todas las visitas prenatales como se lo haya indicado el mdico. Esto es importante. Comunquese con un mdico si:  No est segura de si est en trabajo de parto o si ha roto la bolsa de las aguas.  Tiene mareos.  Tiene clicos leves o siente presin en la parte baja del vientre.  Sufre un dolor persistente en el abdomen.  Sigue teniendo [employer], vomita o tiene heces lquidas.  Advierte un lquido con olor ftido que proviene de la vagina.  Siente dolor al [employer]. Solicite ayuda de inmediato si:  Tiene fiebre.  Tiene una prdida de lquido por la vagina.  Tiene sangrado o pequeas prdidas vaginales.  Siente dolor intenso o clicos en el abdomen.  Aumenta o baja de peso rpidamente.  Tiene dificultades para recuperar el aliento y siente dolor en el pecho.  Sbitamente se le hinchan mucho el rostro, las [place], los tobillos, los pies o las piernas.  No ha sentido los movimientos del beb durante Georgianne Fick.  Siente un dolor de cabeza intenso que no se alivia con medicamentos.  Tiene dificultad para ver.  Tiene prdida de lquido o Special educational needs teacher chorro de lquido de la vagina antes de estar en la semana 37.  Tiene espasmos abdominales (contracciones) regulares antes de estar en la semana 37. Resumen  El tercer trimestre comprende desde la  [employer] la semana40 (desde el mes7 hasta el mes9). Esta es la poca en que el beb en gestacin crece muy rpidamente.  Siga los consejos del mdico con respecto a los medicamentos, la alimentacin y [place].  Preprese para la llegada del beb tomando las clases prenatales, preparando todo lo que necesitar el beb, arreglando la habitacin del beb y concurriendo a los controles mdicos.  Solicite ayuda de inmediato si tiene sangrado por la vagina, siente dolor en el pecho o tiene dificultad para respirar, o si no ha sentido que su beb se mueve en el transcurso de ms de [employer]. Esta informacin no tiene Theme park manager el consejo del mdico. Asegrese de hacerle al mdico cualquier pregunta que tenga. Document Revised: 10/15/2016 Document Reviewed: 10/15/2016 Elsevier Patient Education  2020 [employer].

## 2019-10-21 NOTE — Progress Notes (Signed)
   PRENATAL VISIT NOTE  Subjective:  Lisa Herrera is a 24 y.o. G2P1001 at [redacted]w[redacted]d being seen today for ongoing prenatal care.  She is currently monitored for the following issues for this high-risk pregnancy and has Maternal varicella, non-immune; S/P cesarean section for arrest of descent; Supervision of other normal pregnancy, antepartum; Gastroesophageal reflux disease with esophagitis without hemorrhage; Obesity affecting pregnancy in third trimester, antepartum; Hx of cholelithiasis; Alpha thalassemia silent carrier; Elevated AFP; and Language barrier on their problem list.  Patient reports lump right side near breast, dry skin at nipples.  Contractions: Irritability. Vag. Bleeding: None.  Movement: Present. Denies leaking of fluid.   The following portions of the patient's history were reviewed and updated as appropriate: allergies, current medications, past family history, past medical history, past social history, past surgical history and problem list.   Objective:   Vitals:   10/21/19 0817  BP: 106/73  Pulse: 92  Weight: (!) 277 lb (125.6 kg)   No  Mass felt right chest wall Fetal Status: Fetal Heart Rate (bpm): 139   Movement: Present     General:  Alert, oriented and cooperative. Patient is in no acute distress.  Skin: Skin is warm and dry. No rash noted.   Cardiovascular: Normal heart rate noted  Respiratory: Normal respiratory effort, no problems with respiration noted  Abdomen: Soft, gravid, appropriate for gestational age.  Pain/Pressure: Present     Pelvic: Cervical exam deferred        Extremities: Normal range of motion.  Edema: Trace  Mental Status: Normal mood and affect. Normal behavior. Normal judgment and thought content.   Assessment and Plan:  Pregnancy: G2P1001 at [redacted]w[redacted]d 1. Supervision of other normal pregnancy, antepartum Obese, CS, plans repeat  2. S/P cesarean section for arrest of descent RCS 39 weeks  3. Alpha thalassemia silent carrier Had  counseling  Preterm labor symptoms and general obstetric precautions including but not limited to vaginal bleeding, contractions, leaking of fluid and fetal movement were reviewed in detail with the patient. Please refer to After Visit Summary for other counseling recommendations.   Return in about 1 week (around 10/28/2019).  Future Appointments  Date Time Provider Department Center  10/27/2019  7:45 AM [facility] NURSE [facility]  10/27/2019  8:00 AM [facility] US1 [facility]  10/28/2019  8:15 AM Warden Fillers, MD [facility] None    Scheryl Darter, MD

## 2019-10-27 ENCOUNTER — Ambulatory Visit: Payer: Medicaid Other | Admitting: *Deleted

## 2019-10-27 ENCOUNTER — Other Ambulatory Visit: Payer: Self-pay

## 2019-10-27 ENCOUNTER — Ambulatory Visit: Payer: Medicaid Other | Attending: Maternal & Fetal Medicine

## 2019-10-27 DIAGNOSIS — O34219 Maternal care for unspecified type scar from previous cesarean delivery: Secondary | ICD-10-CM | POA: Diagnosis not present

## 2019-10-27 DIAGNOSIS — Z3A36 36 weeks gestation of pregnancy: Secondary | ICD-10-CM

## 2019-10-27 DIAGNOSIS — O289 Unspecified abnormal findings on antenatal screening of mother: Secondary | ICD-10-CM

## 2019-10-27 DIAGNOSIS — O99213 Obesity complicating pregnancy, third trimester: Secondary | ICD-10-CM | POA: Diagnosis not present

## 2019-10-27 DIAGNOSIS — Z362 Encounter for other antenatal screening follow-up: Secondary | ICD-10-CM | POA: Diagnosis not present

## 2019-10-27 DIAGNOSIS — E669 Obesity, unspecified: Secondary | ICD-10-CM

## 2019-10-27 DIAGNOSIS — Z148 Genetic carrier of other disease: Secondary | ICD-10-CM

## 2019-10-27 NOTE — Progress Notes (Signed)
C/O irreg UC's and a lot of pain "in my bottom." No bleeding or leaking. + FM.

## 2019-10-28 ENCOUNTER — Other Ambulatory Visit (HOSPITAL_COMMUNITY)
Admission: RE | Admit: 2019-10-28 | Discharge: 2019-10-28 | Disposition: A | Discharge status: Home / Self Care | Payer: Medicaid Other | Source: Ambulatory Visit | Attending: Obstetrics and Gynecology | Admitting: Obstetrics and Gynecology

## 2019-10-28 ENCOUNTER — Ambulatory Visit (INDEPENDENT_AMBULATORY_CARE_PROVIDER_SITE_OTHER): Payer: Medicaid Other | Admitting: Obstetrics and Gynecology

## 2019-10-28 VITALS — BP 111/79 | HR 101 | Wt 279.6 lb

## 2019-10-28 DIAGNOSIS — Z3480 Encounter for supervision of other normal pregnancy, unspecified trimester: Secondary | ICD-10-CM

## 2019-10-28 DIAGNOSIS — G4733 Obstructive sleep apnea (adult) (pediatric): Secondary | ICD-10-CM

## 2019-10-28 DIAGNOSIS — Z283 Underimmunization status: Secondary | ICD-10-CM

## 2019-10-28 DIAGNOSIS — D563 Thalassemia minor: Secondary | ICD-10-CM

## 2019-10-28 DIAGNOSIS — O09899 Supervision of other high risk pregnancies, unspecified trimester: Secondary | ICD-10-CM

## 2019-10-28 DIAGNOSIS — Z98891 History of uterine scar from previous surgery: Secondary | ICD-10-CM

## 2019-10-28 DIAGNOSIS — Z3A36 36 weeks gestation of pregnancy: Secondary | ICD-10-CM

## 2019-10-28 DIAGNOSIS — Z6841 Body Mass Index (BMI) 40.0 and over, adult: Secondary | ICD-10-CM | POA: Insufficient documentation

## 2019-10-28 DIAGNOSIS — O99213 Obesity complicating pregnancy, third trimester: Secondary | ICD-10-CM

## 2019-10-28 DIAGNOSIS — Z789 Other specified health status: Secondary | ICD-10-CM

## 2019-10-28 DIAGNOSIS — G4730 Sleep apnea, unspecified: Secondary | ICD-10-CM | POA: Insufficient documentation

## 2019-10-28 NOTE — Progress Notes (Signed)
   PRENATAL VISIT NOTE  Subjective:  Lisa Herrera is a 24 y.o. G2P1001 at [redacted]w[redacted]d being seen today for ongoing prenatal care.  She is currently monitored for the following issues for this high-risk pregnancy and has Maternal varicella, non-immune; S/P cesarean section for arrest of descent; Supervision of other normal pregnancy, antepartum; Gastroesophageal reflux disease with esophagitis without hemorrhage; Obesity affecting pregnancy in third trimester, antepartum; Hx of cholelithiasis; Alpha thalassemia silent carrier; Elevated AFP; Language barrier; Sleep apnea; [redacted] weeks gestation of pregnancy; and BMI 50.0-59.9, adult (HCC) on their problem list.  Patient doing well with no acute concerns today. She reports pelvic pressure and occasional heart palpitations.  Contractions: Irregular. Vag. Bleeding: None.  Movement: Present. Denies leaking of fluid.   Pt very anxious about a lump in left axilla.  On exam a rice grain sized nodule c/w lymph node was palpated.  It was nontender and there was no induration. Per pt's spouse, she cannot sleep lying flat due to respiratory issues.  She was diagnosed with sleep apnea prior to pregnancy but is not compliant with her CPAP.  She also has not followed up with her sleep specialist.  The following portions of the patient's history were reviewed and updated as appropriate: allergies, current medications, past family history, past medical history, past social history, past surgical history and problem list. Problem list updated.  Objective:   Vitals:   10/28/19 0818  BP: 111/79  Pulse: (!) 101  Weight: 279 lb 9.6 oz (126.8 kg)    Fetal Status: Fetal Heart Rate (bpm): 150   Movement: Present  Presentation: Vertex  General:  Alert, oriented and cooperative. Patient is in no acute distress.  Skin: Skin is warm and dry. No rash noted.   Cardiovascular: Normal heart rate noted  Respiratory: Normal respiratory effort, no problems with respiration noted    Abdomen: Soft, obese, gravid, appropriate for gestational age.  Pain/Pressure: Present   Apparent umbilical hernia noted  Pelvic: Cervical exam performed Dilation: Fingertip Effacement (%): 40 Station: Ballotable  Extremities: Normal range of motion.  Edema: Trace  Mental Status:  Normal mood and affect. Normal behavior. Normal judgment and thought content.   Assessment and Plan:  Pregnancy: G2P1001 at [redacted]w[redacted]d  1. Maternal varicella, non-immune Vaccinate after delivery  2. S/P cesarean section for arrest of descent Repeat c/s scheduled on 8/24 with BTL  3. Supervision of other normal pregnancy, antepartum  - Cervicovaginal ancillary only( [place]) - Strep Gp B NAA  4. Alpha thalassemia silent carrier   5. Language barrier Interpreter present  6. Obesity affecting pregnancy in third trimester, antepartum   7. Obstructive sleep apnea syndrome Pt advised to use CPAP machine nightly and f/u with sleep specialist after delivery  8. [redacted] weeks gestation of pregnancy   9. BMI 50.0-59.9, adult (HCC)   Preterm labor symptoms and general obstetric precautions including but not limited to vaginal bleeding, contractions, leaking of fluid and fetal movement were reviewed in detail with the patient.  Please refer to After Visit Summary for other counseling recommendations.   Return for ROB, in person.   Mariel Aloe, MD

## 2019-10-28 NOTE — Patient Instructions (Signed)
Ciruga para Location manager Surgery to Prevent Pregnancy La esterilizacin en la mujer es una ciruga que se realiza para Location manager. En esta ciruga, las trompas de [place] se obstruyen o se Music therapist. Cuando las trompas de [employer] se Music therapist, los vulos que [employer] ovarios no pueden ingresar al tero, los espermatozoides no Tree surgeon a los vulos y usted no puede quedar [place]. La esterilizacin es [place]. Solo debe hacerse si est segura de que no desea tener hijos. Cules son las opciones quirrgicas para la esterilizacin? Hay varias clases de cirugas para esterilizacin en la mujer. Incluyen los siguientes:  Ligadura laparoscpica de trompas. En esta ciruga, las trompas de [employer] se atan, se sellan con calor o se obstruyen con un clip, un anillo o una abrazadera. Tambin puede extraerse una parte pequea de cada trompa de Falopio. Esta ciruga se realiza a travs de varios cortes pequeos (incisiones) con instrumentos especiales que se introducen en el abdomen.  Ligadura de trompas posparto. Esto tambin se denomina minilaparotoma. La ciruga se realiza de inmediato despus del nacimiento, o 1o 2das despus del nacimiento. En esta ciruga, las trompas de [employer] se atan, se sellan con calor o se obstruyen con un clip, un anillo o una abrazadera. Tambin puede extraerse una parte pequea de cada trompa de Falopio. La ciruga se realiza a travs de una nica incisin en el abdomen.  Ligadura de trompas durante una cesrea. En esta ciruga, las trompas de [employer] se atan, se sellan con calor o se obstruyen con un clip, un anillo o una abrazadera. Tambin puede extraerse una parte pequea de cada trompa de Falopio. La ciruga se realiza en el mismo momento que un parto por cesrea. La esterilizacin es un procedimiento seguro? En general, la esterilizacin es segura. Las complicaciones son raras. No obstante, hay algunos riesgos. Incluyen los  siguientes:  Sangrado.  Infeccin.  Reaccin al medicamento utilizado durante el procedimiento.  Lesin en los rganos circundantes.  Fallas en el procedimiento. La esterilizacin es un mtodo efectivo? La esterilizacin es efectiva en casi 100%, pero puede fallar. En casos raros, con [employer], las trompas de [employer] pueden volver a Engineer, building services. Si esto sucede, puede ser posible el embarazo y podr quedar embarazada nuevamente. Los mujeres que se realizan este procedimiento tienen ms probabilidades de sufrir un Psychiatrist ectpico. El embarazo ectpico es aquel que se produce fuera del tero. Esta clase de embarazo puede causar sangrado grave si no se trata. Cules son los beneficios?  En general, es efectiva para toda la vida.  Suele ser segura.  No tiene los inconvenientes de otros tipos de mtodos anticonceptivos porque no afecta las hormonas. Por lo tanto, no influye en los perodos menstruales, en el deseo ni en el rendimiento sexual. Cules son los inconvenientes?  Deber recuperarse y puede tener complicaciones despus de la [place].  Si cambia de Lawyer y decide que quiere tener hijos, no podr Media planner. La esterilizacin puede revertirse, pero esto no siempre es exitoso.  No ofrece proteccin contra las ETS (enfermedades de transmisin sexual).  Aumenta la probabilidad de tener un embarazo ectpico. Siga estas instrucciones en su casa:  Concurra a todas las visitas de seguimiento como se lo haya indicado el mdico. Esto es importante. Resumen  La esterilizacin en la mujer es una ciruga que se realiza para Location manager.  Hay diferentes clases de cirugas de esterilizacin femenina.  La esterilizacin puede revertirse, pero esto no siempre es exitoso.  La esterilizacin no protege contra las enfermedades  de transmisin sexual (ETS). Esta informacin no tiene Theme park manager el consejo del mdico. Asegrese de hacerle al mdico cualquier pregunta que  tenga. Document Revised: 01/10/2018 Document Reviewed: 01/10/2018 Elsevier Patient Education  2020 Elsevier Inc. Systems analyst trimestre de Psychiatrist Third Trimester of Pregnancy  El tercer trimestre comprende desde la [employer] la semana40 (desde el mes7 hasta el mes9). En este trimestre, el beb en gestacin (feto) crece muy rpidamente. Hacia el final del noveno mes, el beb en gestacin mide alrededor de 20pulgadas (45cm) de largo. Pesa entre 6y 10libras [phone]). Siga estas indicaciones en su casa: Medicamentos  [employer] de venta libre y los recetados solamente como se lo haya indicado el mdico. Algunos medicamentos son seguros para tomar durante el Psychiatrist y otros no lo son.  Tome vitaminas prenatales que contengan por lo menos (?g) de cido flico.  Si tiene dificultad para mover el intestino (estreimiento), tome un medicamento para ablandar las heces (laxante) si su mdico se lo autoriza. Comida y bebida   Ingiera alimentos saludables de [place] regular.  No coma carne cruda ni quesos sin cocinar.  Si obtiene poca cantidad de calcio de los alimentos que ingiere, consulte a su mdico sobre la posibilidad de tomar un suplemento diario de calcio.  La ingesta diaria de cuatro o cinco comidas pequeas en lugar de tres comidas abundantes.  Evite el consumo de alimentos ricos en grasas y azcares, como los alimentos fritos y los dulces.  Para evitar el estreimiento: ? Consuma alimentos ricos en fibra, como frutas y verduras frescas, cereales integrales y frijoles. ? Beba suficiente lquido para mantener el pis (orina) claro o de color amarillo plido. Actividad  Haga ejercicios solamente como se lo haya indicado el mdico. Interrumpa la actividad fsica si comienza a tener calambres.  No levante objetos pesados, use zapatos de tacones bajos y sintese derecha.  No haga ejercicio si hace demasiado calor, hay demasiada humedad o se  encuentra en un lugar de mucha altura (altitud alta).  Puede continuar teniendo [employer] [place] [place] [employer], a menos que el mdico le indique lo contrario. Alivio del dolor y del Dentist  Use un sostn que le brinde buen soporte si sus mamas estn sensibles.  Haga pausas frecuentes y descanse con las piernas levantadas si tiene calambres en las piernas o dolor en la zona lumbar.  Dese baos de asiento con agua tibia para Engineer, materials o las molestias causadas por las hemorroides. Use una crema para las hemorroides si el mdico la autoriza.  Si desarrolla venas hinchadas y abultadas (vrices) en las piernas: ? Use medias de compresin o medias de descanso como se lo haya indicado el mdico. ? Levante (eleve) los pies durante , 3 o 4veces por Futures trader. ? Limite el consumo de sal en sus alimentos. Seguridad  [employer] cinturn de seguridad cuando conduzca.  Haga una lista de los nmeros de telfono de Associate Professor, que [employer] nmeros de telfono de familiares, amigos, [facility], as como los departamentos de polica y bomberos. Preparacin para la llegada del beb Para prepararse para la llegada de su beb:  Tome clases prenatales.  Practique ir [employer] al hospital.  [facility] y recorra el rea de maternidad.  Hable en su trabajo acerca de tomar licencia cuando llegue el beb.  Prepare el bolso que llevar al hospital.  Prepare la habitacin del beb.  Concurra a los controles mdicos.  Compre un asiento de seguridad [employer] atrs para llevar al beb en  el automvil. Aprenda cmo instalarlo en el auto. Instrucciones generales  No se d baos de inmersin en agua caliente, baos turcos ni saunas.  No consuma ningn producto que contenga nicotina o tabaco, como cigarrillos y Administrator, Civil Service. Si necesita ayuda para dejar de fumar, consulte al mdico.  No beba alcohol.  No se haga duchas vaginales ni use tampones o toallas higinicas  perfumadas.  No mantenga las piernas cruzadas durante [place].  No haga viajes de larga distancia, excepto si es obligatorio. Hgalos solamente si su mdico la autoriza.  Visite a su dentista si no lo ha Occupational hygienist. Use un cepillo de cerdas suaves para cepillarse los dientes. Psese el hilo dental con suavidad.  Evite el contacto con las bandejas sanitarias de los gatos y la tierra que estos animales usan. Estos elementos contienen bacterias que pueden causar defectos congnitos al beb y la posible prdida del beb (aborto espontneo) o la muerte fetal.  Concurra a todas las visitas prenatales como se lo haya indicado el mdico. Esto es importante. Comunquese con un mdico si:  No est segura de si est en trabajo de parto o si ha roto la bolsa de las aguas.  Tiene mareos.  Tiene clicos leves o siente presin en la parte baja del vientre.  Sufre un dolor persistente en el abdomen.  Sigue teniendo [employer], vomita o tiene heces lquidas.  Advierte un lquido con olor ftido que proviene de la vagina.  Siente dolor al [employer]. Solicite ayuda de inmediato si:  Tiene fiebre.  Tiene una prdida de lquido por la vagina.  Tiene sangrado o pequeas prdidas vaginales.  Siente dolor intenso o clicos en el abdomen.  Aumenta o baja de peso rpidamente.  Tiene dificultades para recuperar el aliento y siente dolor en el pecho.  Sbitamente se le hinchan mucho el rostro, las [place], los tobillos, los pies o las piernas.  No ha sentido los movimientos del beb durante Georgianne Fick.  Siente un dolor de cabeza intenso que no se alivia con medicamentos.  Tiene dificultad para ver.  Tiene prdida de lquido o Special educational needs teacher chorro de lquido de la vagina antes de estar en la semana 37.  Tiene espasmos abdominales (contracciones) regulares antes de estar en la semana 37. Resumen  El tercer trimestre comprende desde la [employer] la semana40 (desde el  mes7 hasta el mes9). Esta es la poca en que el beb en gestacin crece muy rpidamente.  Siga los consejos del mdico con respecto a los medicamentos, la alimentacin y [place].  Preprese para la llegada del beb tomando las clases prenatales, preparando todo lo que necesitar el beb, arreglando la habitacin del beb y concurriendo a los controles mdicos.  Solicite ayuda de inmediato si tiene sangrado por la vagina, siente dolor en el pecho o tiene dificultad para respirar, o si no ha sentido que su beb se mueve en el transcurso de ms de [employer]. Esta informacin no tiene Theme park manager el consejo del mdico. Asegrese de hacerle al mdico cualquier pregunta que tenga. Document Revised: 10/15/2016 Document Reviewed: 10/15/2016 Elsevier Patient Education  2020 [employer].

## 2019-10-28 NOTE — Progress Notes (Signed)
Pt is here for ROB, [redacted]w[redacted]d.  

## 2019-10-29 LAB — CERVICOVAGINAL ANCILLARY ONLY
Chlamydia: NEGATIVE
Comment: NEGATIVE
Comment: NORMAL
Neisseria Gonorrhea: NEGATIVE

## 2019-10-30 LAB — STREP GP B NAA: Strep Gp B NAA: POSITIVE — AB

## 2019-10-31 ENCOUNTER — Inpatient Hospital Stay (HOSPITAL_COMMUNITY)
Admission: AD | Admit: 2019-10-31 | Discharge: 2019-11-01 | Disposition: A | Discharge status: Home / Self Care | Payer: Medicaid Other | Attending: Obstetrics & Gynecology | Admitting: Obstetrics & Gynecology

## 2019-10-31 ENCOUNTER — Encounter (HOSPITAL_COMMUNITY): Payer: Self-pay | Admitting: Obstetrics & Gynecology

## 2019-10-31 ENCOUNTER — Other Ambulatory Visit: Payer: Self-pay

## 2019-10-31 DIAGNOSIS — Z79899 Other long term (current) drug therapy: Secondary | ICD-10-CM | POA: Insufficient documentation

## 2019-10-31 DIAGNOSIS — K219 Gastro-esophageal reflux disease without esophagitis: Secondary | ICD-10-CM | POA: Insufficient documentation

## 2019-10-31 DIAGNOSIS — O99013 Anemia complicating pregnancy, third trimester: Secondary | ICD-10-CM | POA: Insufficient documentation

## 2019-10-31 DIAGNOSIS — O3663X0 Maternal care for excessive fetal growth, third trimester, not applicable or unspecified: Secondary | ICD-10-CM | POA: Insufficient documentation

## 2019-10-31 DIAGNOSIS — D649 Anemia, unspecified: Secondary | ICD-10-CM | POA: Insufficient documentation

## 2019-10-31 DIAGNOSIS — D563 Thalassemia minor: Secondary | ICD-10-CM | POA: Insufficient documentation

## 2019-10-31 DIAGNOSIS — Z7982 Long term (current) use of aspirin: Secondary | ICD-10-CM | POA: Insufficient documentation

## 2019-10-31 DIAGNOSIS — Z3689 Encounter for other specified antenatal screening: Secondary | ICD-10-CM

## 2019-10-31 DIAGNOSIS — E669 Obesity, unspecified: Secondary | ICD-10-CM | POA: Insufficient documentation

## 2019-10-31 DIAGNOSIS — Z9049 Acquired absence of other specified parts of digestive tract: Secondary | ICD-10-CM | POA: Insufficient documentation

## 2019-10-31 DIAGNOSIS — O99213 Obesity complicating pregnancy, third trimester: Secondary | ICD-10-CM | POA: Insufficient documentation

## 2019-10-31 DIAGNOSIS — O4703 False labor before 37 completed weeks of gestation, third trimester: Secondary | ICD-10-CM

## 2019-10-31 DIAGNOSIS — O99613 Diseases of the digestive system complicating pregnancy, third trimester: Secondary | ICD-10-CM | POA: Insufficient documentation

## 2019-10-31 DIAGNOSIS — O99820 Streptococcus B carrier state complicating pregnancy: Secondary | ICD-10-CM | POA: Insufficient documentation

## 2019-10-31 DIAGNOSIS — O99353 Diseases of the nervous system complicating pregnancy, third trimester: Secondary | ICD-10-CM | POA: Insufficient documentation

## 2019-10-31 DIAGNOSIS — Z3A36 36 weeks gestation of pregnancy: Secondary | ICD-10-CM

## 2019-10-31 MED ORDER — FENTANYL CITRATE (PF) 100 MCG/2ML IJ SOLN
100.0000 ug | Freq: Once | INTRAMUSCULAR | Status: AC
  Administered 2019-11-01: 100 ug via INTRAVENOUS
  Filled 2019-10-31: qty 2

## 2019-10-31 MED ORDER — LACTATED RINGERS IV BOLUS
1000.0000 mL | Freq: Once | INTRAVENOUS | Status: AC
  Administered 2019-11-01: 1000 mL via INTRAVENOUS

## 2019-10-31 NOTE — MAU Note (Signed)
Pt reports to MAU stating about 30 min ago she noticed a greenish mucous come out. Pt reports pelvic pain that is a 7/10. No bleeding or LOF. +FM. Pt reports increased swelling in her legs and feet.

## 2019-11-01 DIAGNOSIS — O3663X0 Maternal care for excessive fetal growth, third trimester, not applicable or unspecified: Secondary | ICD-10-CM | POA: Diagnosis not present

## 2019-11-01 DIAGNOSIS — O99820 Streptococcus B carrier state complicating pregnancy: Secondary | ICD-10-CM | POA: Diagnosis not present

## 2019-11-01 DIAGNOSIS — Z3A36 36 weeks gestation of pregnancy: Secondary | ICD-10-CM | POA: Diagnosis not present

## 2019-11-01 DIAGNOSIS — D649 Anemia, unspecified: Secondary | ICD-10-CM | POA: Diagnosis not present

## 2019-11-01 DIAGNOSIS — O99213 Obesity complicating pregnancy, third trimester: Secondary | ICD-10-CM | POA: Diagnosis not present

## 2019-11-01 DIAGNOSIS — Z7982 Long term (current) use of aspirin: Secondary | ICD-10-CM | POA: Diagnosis not present

## 2019-11-01 DIAGNOSIS — O4703 False labor before 37 completed weeks of gestation, third trimester: Secondary | ICD-10-CM

## 2019-11-01 DIAGNOSIS — O99353 Diseases of the nervous system complicating pregnancy, third trimester: Secondary | ICD-10-CM | POA: Diagnosis not present

## 2019-11-01 DIAGNOSIS — K219 Gastro-esophageal reflux disease without esophagitis: Secondary | ICD-10-CM | POA: Diagnosis not present

## 2019-11-01 DIAGNOSIS — O99013 Anemia complicating pregnancy, third trimester: Secondary | ICD-10-CM | POA: Diagnosis not present

## 2019-11-01 DIAGNOSIS — Z79899 Other long term (current) drug therapy: Secondary | ICD-10-CM | POA: Diagnosis not present

## 2019-11-01 DIAGNOSIS — D563 Thalassemia minor: Secondary | ICD-10-CM | POA: Diagnosis not present

## 2019-11-01 DIAGNOSIS — Z9049 Acquired absence of other specified parts of digestive tract: Secondary | ICD-10-CM | POA: Diagnosis not present

## 2019-11-01 DIAGNOSIS — O99613 Diseases of the digestive system complicating pregnancy, third trimester: Secondary | ICD-10-CM | POA: Diagnosis not present

## 2019-11-01 DIAGNOSIS — E669 Obesity, unspecified: Secondary | ICD-10-CM | POA: Diagnosis not present

## 2019-11-01 NOTE — Discharge Instructions (Signed)
Eleccin del mtodo anticonceptivo Contraception Choices La anticoncepcin, o los mtodos anticonceptivos, hace referencia a los mtodos o dispositivos que evitan el embarazo. Mtodos hormonales Implante anticonceptivo  Un implante anticonceptivo consiste en un tubo plstico delgado que contiene una hormona. Se inserta en la parte superior del brazo. Puede permanecer en el lugar hasta por 3 aos. Inyecciones de progestina sola Las inyecciones de progestina sola contienen progestina, una forma sinttica de la hormona progesterona. Un mdico las administra cada 3 meses. Pldoras anticonceptivas  Las pldoras anticonceptivas son pastillas que contienen hormonas que evitan el embarazo. Deben tomarse una vez al da, preferentemente a la misma hora cada da. Parches anticonceptivos  El parche anticonceptivo contiene hormonas que evitan el embarazo. Se coloca en la piel, debe cambiarse una vez a la semana durante tres semanas y debe retirarse en la cuarta semana. Se necesita una receta para utilizar este mtodo anticonceptivo. Anillo vaginal  Un anillo vaginal contiene hormonas que evitan el embarazo. Se coloca en la vagina durante tres semanas y se retira en la cuarta semana. Luego se repite el proceso con un anillo nuevo. Se necesita una receta para utilizar este mtodo anticonceptivo. Anticonceptivo de emergencia Los anticonceptivos de emergencia son mtodos para evitar un embarazo despus de tener sexo sin proteccin. Vienen en forma de pldora y pueden tomarse hasta 5 das despus de tener sexo. Funcionan mejor cuando se toman lo ms pronto posible luego de tener sexo. La mayora de los anticonceptivos de emergencia estn disponibles sin receta mdica. Este mtodo no debe utilizarse como el nico mtodo anticonceptivo. Mtodos de barrera Preservativo masculino  Un preservativo masculino es una vaina delgada que se coloca sobre el pene durante el sexo. Los preservativos evitan que el esperma  ingrese en el cuerpo de la mujer. Pueden utilizarse con un espermicida para aumentar la efectividad. Deben desecharse luego de su uso. Preservativo femenino  Un preservativo femenino es una vaina blanda y holgada que se coloca en la vagina antes de tener sexo. El preservativo evita que el esperma ingrese en el cuerpo de la mujer. Deben desecharse luego de su uso. Diafragma  Un diafragma es una barrera blanda con forma de cpula. Se inserta en la vagina antes del sexo, junto con un espermicida. El diafragma bloquea el ingreso de esperma en el tero, y el espermicida mata a los espermatozoides. El diafragma debe permanecer en la vagina durante 6 a 8 horas despus de tener sexo y debe retirarse en el plazo de las 24 horas. Un diafragma es recetado y colocado por un mdico. Debe reemplazarse cada 1 a 2 aos, despus de dar a luz, de aumentar ms de 15lb (6,8kg) y de una ciruga plvica. Capuchn cervical  Un capuchn cervical es una copa redonda y blanda de ltex o plstico que se coloca en el cuello uterino. Se inserta en la vagina antes del sexo, junto con un espermicida. Bloquea el ingreso del esperma en el tero. El capuchn debe permanecer en el lugar durante 6 a 8 horas despus de tener sexo y debe retirarse en el plazo de las 48 horas. Un capuchn cervical debe ser recetado y colocado por un mdico. Debe reemplazarse cada 2aos. Esponja  Una esponja es una pieza blanda y circular de espuma de poliuretano que contiene espermicida. La esponja ayuda a bloquear el ingreso de esperma en el tero, y el espermicida mata a los espermatozoides. Para utilizarla, debe humedecerla e insertarla en la vagina. Debe insertarse antes de tener sexo, debe permanecer dentro al menos durante   6 horas despus de tener sexo y debe retirarse y Nurse, adult en el plazo de las 30 horas. Espermicidas Los espermicidas son sustancias qumicas que matan o bloquean al esperma y no lo dejan ingresar al cuello uterino y al tero.  Vienen en forma de crema, gel, supositorio, espuma o comprimido. Un espermicida debe insertarse en la vagina con un aplicador al menos 10 o 15 minutos antes de tener sexo para dar tiempo a que surta [place]. El proceso debe repetirse cada vez que tenga sexo. Los espermicidas no requieren Emergency planning/management officer. Anticonceptivos intrauterinos Dispositivo intrauterino (DIU). Un DIU es un dispositivo en forma de T que se coloca en el tero. Existen dos tipos:  DIU hormonal.Este tipo contiene progestina, una forma sinttica de la hormona progesterona. Este tipo puede permanecer colocado durante 3 a 5 aos.  DIU de cobre.Este tipo est recubierto con un alambre de cobre. Puede permanecer colocado durante 10 aos.  Mtodos anticonceptivos permanentes Ligadura de trompas en la mujer En este mtodo, se sellan, atan u obstruyen las trompas de Falopio durante una ciruga para Automotive engineer que el vulo descienda [place]. Esterilizacin histeroscpica En este mtodo, se coloca un implante pequeo y flexible dentro de cada trompa de Falopio. Los implantes hacen que se forme un tejido cicatricial en las trompas de Falopio y que las obstruya para que el espermatozoide no pueda llegar al vulo. El procedimiento demora alrededor de 3 meses para que sea [place]. Debe utilizarse otro mtodo anticonceptivo durante esos 3 meses. Esterilizacin masculina Este es un procedimiento que consiste en atar los conductos que transportan el esperma (vasectoma). Luego del procedimiento, el hombre Manufacturing engineer lquido (semen). Mtodos de planificacin natural Planificacin familiar natural En este mtodo, la pareja no tiene [employer] la mujer podra quedar [place]. Mtodo calendario Marketing executive un seguimiento de la duracin de cada ciclo menstrual, identificar los [employer] que se puede producir un Psychiatrist y no Warehouse manager sexo durante esos [place]. Mtodo de la ovulacin En este mtodo, la pareja evita  tener sexo durante la ovulacin. Mtodo sintotrmico Este mtodo implica no tener sexo durante la ovulacin. Normalmente, la mujer comprueba la ovulacin al observar cambios en su temperatura y en la consistencia del moco cervical. Mtodo posovulacin En este mtodo, la pareja espera a que finalice la ovulacin para Doctor, hospital. Resumen  La anticoncepcin, o los mtodos anticonceptivos, hace referencia a los mtodos o dispositivos que evitan el [place].  Los mtodos anticonceptivos hormonales incluyen implantes, inyecciones, pastillas, parches, anillos vaginales y anticonceptivos de Associate Professor.  Los mtodos anticonceptivos de barrera pueden incluir preservativos masculinos, preservativos femeninos, diafragmas, capuchones cervicales, esponjas y espermicidas.  [employer] tipos de DIU (dispositivo intrauterino). Un DIU puede colocarse en el tero de una mujer para evitar el embarazo durante 3 a 5 aos.  La esterilizacin permanente puede realizarse mediante un procedimiento para hombres, mujeres o ambos.  Los [employer] de Medical sales representative natural incluyen no tener [employer] la mujer podra quedar [place]. Esta informacin no tiene Theme park manager el consejo del mdico. Asegrese de hacerle al mdico cualquier pregunta que tenga. Document Revised: 04/13/2017 Document Reviewed: 07/02/2016 Elsevier Patient Education  2020 [employer].

## 2019-11-01 NOTE — MAU Provider Note (Addendum)
History     CSN: [id number]  Arrival date and time: 10/31/19 2130   First Provider Initiated Contact with Patient 10/31/19 2314      Chief Complaint  Patient presents with  . Pelvic Pain  . Vaginal Discharge   HPI  Lisa Herrera, 24 yr old, G2P1001, at 60w5darrives to unit c/o green mucous-like discharge and having period painful contractions.  She is scheduled for a repeat c-section on 8.24.21 and was last seen in clinic on 8.4.21 At that visit she was checked for ct/gc and GBS.  She is GBS positive.   She began feeling painful contractions this afternoon, along with the presence of the mucous.  She has been having contractions for the last few days, at times as frequent as 5 per 2 hours.  She is concerned and uncomfortable.    Her pregnancy has been complicated by GERD; obesity; sleep apnea; silent carrier of alpha thalassemia; past hx of c/s; and language barrier.  Her brother is with her today, he is translating and offering support at the bedside.      Past Medical History:  Diagnosis Date  . Gallstones   . Migraine     Past Surgical History:  Procedure Laterality Date  . CESAREAN SECTION N/A 01/14/2016   Procedure: CESAREAN SECTION;  Surgeon: UOsborne Oman MD;  [facility]  Service: Obstetrics;  Laterality: N/A;  . CHOLECYSTECTOMY      Family History  Problem Relation Age of Onset  . Breast cancer Maternal Aunt        50's    Social History   Tobacco Use  . Smoking status: Never Smoker  . Smokeless tobacco: Never Used  Vaping Use  . Vaping Use: Never used  Substance Use Topics  . Alcohol use: No    Alcohol/week: 0.0 standard drinks  . Drug use: No    Allergies: No Known Allergies  Medications Prior to Admission  Medication Sig Dispense Refill Last Dose  . acetaminophen (TYLENOL) 500 MG tablet Take 1 tablet (500 mg total) by mouth every 6 (six) hours as needed. 30 tablet 0 Past Week at Unknown time  . aspirin EC 81 MG tablet Take 1  tablet (81 mg total) by mouth daily. 30 tablet 5 10/31/2019 at Unknown time  . Elastic Bandages & Supports (COMFORT FIT MATERNITY SUPP LG) MISC 1 each by Does not apply route daily. 1 each 0 10/31/2019 at Unknown time  . folic acid (FOLVITE) 1 MG tablet Take 1 tablet (1 mg total) by mouth daily. 30 tablet 10 10/31/2019 at Unknown time  . omeprazole (PRILOSEC) 20 MG capsule Take 20 mg by mouth daily.    10/31/2019 at Unknown time  . Prenatal Vit-Fe Fumarate-FA (PRENATAL MULTIVITAMIN) TABS tablet Take 1 tablet by mouth daily at 12 noon.   10/31/2019 at Unknown time  . Blood Pressure Monitoring (BLOOD PRESSURE KIT) DEVI 1 kit by Does not apply route once a week. Check Blood Pressure regularly and record readings into the Babyscripts App.  Large Cuff.  DX O90.0 1 each 0   . butalbital-acetaminophen-caffeine (ESGIC) 50-325-40 MG tablet Take 1-2 tablets by mouth 2 (two) times daily as needed for headache. (Patient not taking: Reported on 08/31/2019) 14 tablet 0   . cyclobenzaprine (FLEXERIL) 5 MG tablet Take 1 tablet (5 mg total) by mouth 3 (three) times daily as needed for muscle spasms. (Patient not taking: Reported on 07/28/2019) 20 tablet 0     Review of Systems  Respiratory:  Negative.   Cardiovascular: Negative.   Gastrointestinal: Positive for nausea. Abdominal pain: cramping.  Genitourinary: Negative.   Neurological: Negative.   Psychiatric/Behavioral: Negative.       Physical Exam   Blood pressure 116/65, pulse 85, temperature 98.9 F (37.2 C), temperature source Oral, resp. rate 17, last menstrual period 02/17/2019, SpO2 100 %, unknown if currently breastfeeding.  Physical Exam HENT:     Head: Normocephalic.  Eyes:     Pupils: Pupils are equal, round, and reactive to light.  Cardiovascular:     Rate and Rhythm: Normal rate and regular rhythm.     Heart sounds: Normal heart sounds.  Pulmonary:     Effort: Pulmonary effort is normal.     Breath sounds: Normal breath sounds.  Abdominal:      General: Abdomen is flat.     Palpations: Abdomen is soft.  Musculoskeletal:        General: Normal range of motion.     Cervical back: Normal range of motion.  Skin:    General: Skin is warm and dry.  Neurological:     General: No focal deficit present.     Mental Status: She is alert and oriented to person, place, and time.  Psychiatric:        Mood and Affect: Mood normal.        MAU Course  Procedures  MDM 1. Cervical exam 1.5/thick/-3  2. Offer analgesia 3. Fetal monitoring  Assessment and Plan  1. R/O labor 2. Cervical exam  3. Fetal monitoring, if reassuring, discharge  4. Pain medication  100 mcg Fentanyl administered via IV at [place] 11/01/2019, 2:08 AM   Attestation of Supervision of Student:  I confirm that I have verified the information documented in the nurse midwife student's note and that I have also personally reperformed the history, physical exam and all medical decision making activities.  I have verified that all services and findings are accurately documented in this student's note; and I agree with management and plan as outlined in the documentation. I have also made any necessary editorial changes.  145 bpm, Mod Var, -Decels, +Accels Irregular Ctx, palpates mild  PE: General: Obese, Mild Distress with Ctx. Abd: Gravid Appears LGA Ext: +2 Pitting Edema of BLE   0133 Nurse instructed to recheck cervix and reports no change. Patient reports improvement in pain with fentanyl dosing. NST reactive. Labor Precautions given Discharged to home in stable condition  Maryann Conners, [place] for [employer], [place] [employer] 11/01/2019 6:16 AM

## 2019-11-02 ENCOUNTER — Telehealth (HOSPITAL_COMMUNITY): Payer: Self-pay | Admitting: *Deleted

## 2019-11-02 NOTE — Telephone Encounter (Signed)
Preadmission screen  

## 2019-11-02 NOTE — Patient Instructions (Addendum)
Instrucciones Para Antes de la Ciruga   Su ciruga est programada para 11/17/2019  (your procedure is scheduled on) Entre por la entrada principal del [facility]  a las 0730 de la [place] -(enter through the main entrance at [facility] at [employer] AM    [facility] Hortencia Conradi [place] [phone] para informarnos de su llegada. (pick up phone, dial [phone] on arrival)     Por favor llame al [phone] si tiene algn problema en la maana de la ciruga (please call this number if you have any problems the morning of surgery.)                  Recuerde: (Remember)  No coma alimentos. (Do not eat food (After Midnight) Desps de medianoche)    No tome lquidos claros. (Do not drink clear liquids (After Midnight) Desps de medianoche)    No use joyas, maquillaje de ojos, lpiz labial, crema para el cuerpo o esmalte de uas oscuro. (Do not wear jewelry, eye makeup, lipstick, body lotion, or dark fingernail polish). Puede usar desodorante (you may wear deodorant)    No se afeite 48 horas de su ciruga. (Do not shave 48 hours before your surgery)    No traiga objetos de valor al hospital.  [place] no se hace responsable de ninguna pertenencia, ni objetos de valor que haya trado al hospital. (Do not bring valuable to the hospital.  [employer] is not responsible for any belongings or valuables brought to the hospital)   [facility] medicinas en la maana de la ciruga con un SORBITO de agua omeprazole (take these meds the morning of surgery with a SIP of water)     Durante la ciruga no se pueden usar lentes de contacto, dentaduras o puentes. (Contacts, dentures or bridgework cannot be worn in surgery).   Si va a ser ingresado despus de la ciruga, deje la [employer] en el carro hasta que se le haya asignado una habitacin. (If you are to be admitted after surgery, leave suitcase in car until your room has been assigned.)   A los pacientes que se les  d de alta el mismo da no se les permitir manejar a casa.  (Patients discharged on the day of surgery will not be allowed to drive home)    [place] y nmero de telfono del Programmer, multimedia na. (Name and telephone number of your driver)   Instrucciones especiales N/A (Special Instructions)   Por favor, lea las hojas informativas que le entregaron. (Please read over the following fact sheets that you were given) Surgical Site Infection Prevention

## 2019-11-02 NOTE — Pre-Procedure Instructions (Signed)
Interpreter number [phone]

## 2019-11-03 ENCOUNTER — Encounter (HOSPITAL_COMMUNITY): Payer: Self-pay

## 2019-11-03 NOTE — Pre-Procedure Instructions (Signed)
[phone] interpreter number

## 2019-11-04 ENCOUNTER — Ambulatory Visit (INDEPENDENT_AMBULATORY_CARE_PROVIDER_SITE_OTHER): Payer: Medicaid Other | Admitting: Obstetrics and Gynecology

## 2019-11-04 ENCOUNTER — Encounter: Payer: Self-pay | Admitting: Obstetrics and Gynecology

## 2019-11-04 ENCOUNTER — Other Ambulatory Visit: Payer: Self-pay

## 2019-11-04 VITALS — BP 112/73 | HR 100 | Wt 279.6 lb

## 2019-11-04 DIAGNOSIS — Z283 Underimmunization status: Secondary | ICD-10-CM

## 2019-11-04 DIAGNOSIS — O99213 Obesity complicating pregnancy, third trimester: Secondary | ICD-10-CM

## 2019-11-04 DIAGNOSIS — Z98891 History of uterine scar from previous surgery: Secondary | ICD-10-CM

## 2019-11-04 DIAGNOSIS — O09899 Supervision of other high risk pregnancies, unspecified trimester: Secondary | ICD-10-CM

## 2019-11-04 DIAGNOSIS — Z2839 Other underimmunization status: Secondary | ICD-10-CM

## 2019-11-04 DIAGNOSIS — M79622 Pain in left upper arm: Secondary | ICD-10-CM

## 2019-11-04 DIAGNOSIS — Z3A37 37 weeks gestation of pregnancy: Secondary | ICD-10-CM

## 2019-11-04 DIAGNOSIS — Z3480 Encounter for supervision of other normal pregnancy, unspecified trimester: Secondary | ICD-10-CM

## 2019-11-04 NOTE — Patient Instructions (Addendum)
Tercer trimestre de embarazo Third Trimester of Pregnancy El tercer trimestre comprende desde la semana28 hasta la semana40 (desde el mes7 hasta el mes9). El tercer trimestre es un perodo en el que el beb en gestacin (feto) crece rpidamente. Hacia el final del noveno mes, el feto mide alrededor de 20pulgadas (45cm) de largo y pesa entre 6 y 10 libras (2,700 y 4,500kg). Cambios en el cuerpo durante el tercer trimestre Su organismo continuar atravesando por muchos cambios durante el embarazo. Estos cambios varan de una mujer a otra. Durante el tercer trimestre:  Seguir aumentando de peso. Es de esperar que aumente entre 25 y 35libras (11 y 16kg) hacia el final del embarazo.  Podrn aparecer las primeras estras en las caderas, el abdomen y las mamas.  Puede tener necesidad de orinar con ms frecuencia porque el feto baja hacia la pelvis y ejerce presin sobre la vejiga.  Puede desarrollar o continuar teniendo acidez estomacal. Esto se debe a que el aumento de las hormonas hace que los msculos en el tubo digestivo trabajen ms lentamente.  Puede desarrollar o continuar teniendo estreimiento debido a que el aumento de las hormonas ralentiza la digestin y hace que los msculos que empujan los desechos a travs de los intestinos se relajen.  Puede desarrollar hemorroides. Estas son venas hinchadas (venas varicosas) en el recto que pueden causar picazn o dolor.  Puede desarrollar venas hinchadas y abultadas (venas varicosas) en las piernas.  Puede presentar ms dolor en la pelvis, la espalda o los muslos. Esto se debe al aumento de peso y al aumento de las hormonas que relajan las articulaciones.  Tal vez haya cambios en el cabello. Esto cambios pueden incluir su engrosamiento, crecimiento rpido y cambios en la textura. Adems, a algunas mujeres se les cae el cabello durante o despus del embarazo, o tienen el cabello seco o fino. Lo ms probable es que el cabello se le normalice  despus del nacimiento del beb.  Sus pechos seguirn creciendo y se pondrn cada vez ms sensibles. Un lquido amarillo (calostro) puede salir de sus pechos. Esta es la primera leche que usted produce para su beb.  El ombligo puede salir hacia afuera.  Puede observar que se le hinchan las manos, el rostro o los tobillos.  Puede presentar un aumento del hormigueo o entumecimiento en las manos, brazos y piernas. La piel de su vientre tambin puede sentirse entumecida.  Puede sentir que le falta el aire debido a que se expande el tero.  Puede tener ms problemas para dormir. Esto puede deberse al tamao de su vientre, una mayor necesidad de orinar y un aumento en el metabolismo de su cuerpo.  Puede notar que el feto "baja" o lo siente ms bajo, en el abdomen (aligeramiento).  Puede tener un aumento de la secrecin vaginal.  Puede notar que las articulaciones se sienten flojas y puede sentir dolor alrededor del hueso plvico. Qu debe esperar en las visitas prenatales Le harn exmenes prenatales cada 2semanas hasta la semana36. A partir de ese momento le harn los exmenes semanales. Durante una visita prenatal de rutina:  La pesarn para asegurarse de que usted y el beb estn creciendo normalmente.  Le tomarn la presin arterial.  Le medirn el abdomen para controlar el desarrollo del beb.  Se escucharn los latidos cardacos fetales.  Se evaluarn los resultados de los estudios solicitados en visitas anteriores.  Le revisarn el cuello del tero cuando est prxima la fecha de parto para controlar si el cuello uterino   se ha afinado o adelgazado (borrado).  Le harn una prueba de estreptococos del grupo B. Esto sucede entre las semanas 35 y 37. El mdico puede preguntarle lo siguiente:  Cmo le gustara que fuera el parto.  Cmo se siente.  Si siente los movimientos del beb.  Si ha tenido sntomas anormales, como prdida de lquido, sangrado, dolores de cabeza  intensos o clicos abdominales.  Si est consumiendo algn producto que contenga tabaco, como cigarrillos, tabaco de mascar y cigarrillos electrnicos.  Si tiene alguna pregunta. Otros exmenes o estudios de deteccin que pueden realizarse durante el tercer trimestre incluyen lo siguiente:  Anlisis de sangre para controlar los niveles de hierro (anemia).  Controles fetales para determinar su salud, nivel de actividad y crecimiento. Si tiene alguna enfermedad o hay problemas durante el embarazo, le harn estudios.  Prueba sin estrs. Esta prueba verifica la salud de su beb y se utiliza para detectar signos de problemas, tales como si el beb no est recibiendo suficiente oxgeno. Durante esta prueba, se coloca un cinturn alrededor de su vientre. Al moverse el beb, se controla su frecuencia cardaca. Qu es el falso trabajo de parto? El falso trabajo de parto es una afeccin en la que se sienten pequeos e irregulares espasmos de los msculos del tero (contracciones) que generalmente desaparecen al hacer reposo, cambiar de posicin o al beber agua. Estas contracciones se llaman contracciones de Braxton Hicks. Las contracciones pueden durar horas, das o incluso semanas, antes de que el verdadero trabajo de parto se inicie. Si las contracciones ocurren a intervalos regulares, se vuelven ms frecuentes, aumentan en intensidad o se vuelven dolorosas, debera ver al mdico.  Cules son los signos del trabajo de parto?  Clicos abdominales.  Contracciones regulares que comienzan en intervalos de 10 minutos y se vuelven ms fuertes y ms frecuentes con el tiempo.  Contracciones que comienzan en la parte superior del tero y se extienden hacia abajo, a la zona inferior del abdomen y la espalda.  Aumento de la presin en la pelvis y dolor latente en la espalda.  Una secrecin de mucosidad acuosa o con sangre que sale de la vagina.  Prdida de lquido amnitico. Esto tambin se conoce como  "ruptura de la bolsa de las aguas". Esto puede ser un chorro o un goteo constante y lento de lquido. Informe a su mdico si tiene un color u olor extrao. Si tiene alguno de estos signos, llame a su mdico de inmediato, incluso si es antes de la fecha de parto. Siga estas indicaciones en su casa: Medicamentos  Siga las indicaciones del mdico en relacin con el uso de medicamentos. Durante el embarazo, hay medicamentos que pueden tomarse y otros que no.  Tome vitaminas prenatales que contengan por lo menos 600microgramos (?g) de cido flico.  Si est estreida, tome un laxante suave, si el mdico lo autoriza. Qu debe comer y beber   Lleve una dieta equilibrada que incluya gran cantidad de frutas y verduras frescas, cereales integrales, buenas fuentes de protenas como carnes magras, huevos o tofu, y lcteos descremados. El mdico la ayudar a determinar la cantidad de peso que puede aumentar.  No coma carne cruda ni quesos sin cocinar. Estos elementos contienen grmenes que pueden causar defectos congnitos en el beb.  Si no consume muchos alimentos con calcio, hable con su mdico sobre si debera tomar un suplemento diario de calcio.  La ingesta diaria de cuatro o cinco comidas pequeas en lugar de tres comidas abundantes.  Limite el   consumo de alimentos con alto contenido de grasas y azcares procesados, como alimentos fritos o dulces.  Para evitar el estreimiento: ? Bebe suficiente lquido para mantener la orina clara o de color amarillo plido. ? Consuma alimentos ricos en fibra, como frutas y verduras frescas, cereales integrales y frijoles. Actividad  Haga ejercicio solamente como se lo haya indicado el mdico. La mayora de las mujeres pueden continuar su rutina de ejercicios durante el embarazo. Intente realizar como mnimo 30minutos de actividad fsica por lo menos 5das a la semana. Deje de hacer ejercicio si experimenta contracciones uterinas.  Evite levantar pesos  excesivos.  No haga ejercicio en condiciones de calor o humedad extremas, o a grandes alturas.  Use zapatos cmodos de tacn bajo.  Adopte una buena postura.  Puede seguir teniendo relaciones sexuales, excepto que el mdico le diga lo contrario. Alivio del dolor y del malestar  Haga pausas frecuentes y descanse con las piernas elevadas si tiene calambres en las piernas o dolor en la zona lumbar.  Dese baos de asiento con agua tibia para aliviar el dolor o las molestias causadas por las hemorroides. Use una crema para las hemorroides si el mdico la autoriza.  Use un sostn que le brinde buen soporte para prevenir las molestias causadas por la sensibilidad en los pechos.  Si tiene venas varicosas: ? Use pantimedias que brinden soporte o medias de compresin como se lo haya indicado el mdico. ? Eleve los pies durante 15minutos, 3 o 4veces por da. Cuidados prenatales  Escriba sus preguntas. Llvelas cuando concurra a las visitas prenatales.  Concurra a todas las visitas prenatales tal como se lo haya indicado el mdico. Esto es importante. Seguridad  Use el cinturn de seguridad en todo momento mientras conduce.  Haga una lista de los nmeros de telfono de emergencia, que incluya los nmeros de telfono de familiares, amigos, el hospital y los departamentos de polica y bomberos. Instrucciones generales  Evite el contacto con las bandejas sanitarias de los gatos y la tierra que estos animales usan. Estos elementos contienen grmenes que pueden causar defectos congnitos en el beb. Si tiene un gato, pdale a alguien que limpie la caja de arena por usted.  No haga viajes largos excepto que sea absolutamente necesario y solo con la autorizacin de su mdico.  No se d baos de inmersin en agua caliente, baos turcos ni saunas.  No beber alcohol.  No consuma ningn producto que contenga nicotina o tabaco, como cigarrillos y cigarrillos electrnicos. Si necesita ayuda para  dejar de fumar, consulte al mdico.  No use hierbas medicinales ni medicamentos que no le hayan recetado. Estas sustancias qumicas afectan la formacin y el desarrollo del beb.  No se haga duchas vaginales ni use tampones o toallas higinicas perfumadas.  No mantenga las piernas cruzadas durante largos periodos de tiempo.  Para prepararse para la llegada de su beb: ? Tome clases prenatales para entender, practicar, y hacer preguntas sobre el trabajo de parto y el parto. ? Haga un ensayo de la partida al hospital. ? Visite el hospital y recorra el rea de maternidad. ? Pida un permiso de maternidad o paternidad a sus empleadores. ? Organice para que algn familiar o amigo cuide a sus mascotas mientras usted est en el hospital. ? Compre un asiento de seguridad orientado hacia atrs, y asegrese de saber cmo instalarlo en su automvil. ? Prepare el bolso que llevar al hospital. ? Prepare la habitacin del beb. Asegrese de quitar todas las almohadas y animales   de peluche de la cuna del beb para evitar la asfixia.  Visite a su dentista si no lo ha hecho durante el embarazo. Use un cepillo de dientes blando para higienizarse los dientes y psese el hilo dental con suavidad. Comunquese con un mdico si:  No est segura de que est en trabajo de parto o de que ha roto la bolsa de las aguas.  Se siente mareada.  Siente clicos leves, presin en la pelvis o dolor persistente en el abdomen.  Siente dolor en la parte inferior de la espalda.  Tiene nuseas, vmitos o diarrea persistentes.  Observa una secrecin vaginal inusual o con mal olor.  Siente dolor al orinar. Solicite ayuda de inmediato si:  Rompe la bolsa de las aguas antes de la semana 37.  Tiene contracciones regulares en intervalos de menos de 5 minutos antes de la semana 37.  Tiene fiebre.  Tiene una prdida de lquido por la vagina.  Tiene sangrado o pequeas prdidas vaginales.  Tiene dolor o clicos  abdominales intensos.  Baja de peso o sube de peso rpidamente.  Tiene dificultad para respirar y siente dolor de pecho.  Sbitamente se le hinchan mucho el rostro, las manos, los tobillos, los pies o las piernas.  Su beb se mueve menos de 10 veces en 2 horas.  Siente un dolor de cabeza intenso que no se alivia al tomar medicamentos.  Nota cambios en la visin. Resumen  El tercer trimestre comprende desde la semana28 hasta la semana40, es decir, desde el mes7 hasta el mes9. El tercer trimestre es un perodo en el que el beb en gestacin (feto) crece rpidamente.  Durante el tercer trimestre, su incomodidad puede aumentar a medida que usted y su beb continan aumentando de peso. Es posible que tenga dolor abdominal, en las piernas y en la espalda, problemas para dormir y una mayor necesidad de orinar.  Durante el tercer trimestre, sus pechos seguirn creciendo y se pondrn cada vez ms sensibles. Un lquido amarillo (calostro) puede salir de sus pechos. Esta es la primera leche que usted produce para su beb.  El falso trabajo de parto es una afeccin en la que se sienten pequeos e irregulares espasmos de los msculos del tero (contracciones) que a la larga desaparecen. Estas contracciones se llaman contracciones de Braxton Hicks. Las contracciones pueden durar horas, das o incluso semanas, antes de que el verdadero trabajo de parto se inicie.  Los signos del trabajo de parto pueden incluir: calambres abdominales; contracciones regulares que comienzan en intervalos de 10 minutos y se vuelven ms fuertes y ms frecuentes con el tiempo; una secrecin de mucosidad acuosa o con sangre que sale de la vagina; aumento de la presin en la pelvis y dolor latente en la espalda; y prdida de lquido amnitico. Esta informacin no tiene como fin reemplazar el consejo del mdico. Asegrese de hacerle al mdico cualquier pregunta que tenga. Document Revised: 07/24/2016 Document Reviewed:  07/24/2016 Elsevier Patient Education  2020 Elsevier Inc.  

## 2019-11-04 NOTE — Progress Notes (Signed)
PRENATAL VISIT NOTE  Subjective:  Lisa Herrera is a 24 y.o. G2P1001 at [redacted]w[redacted]d being seen today for ongoing prenatal care.  She is currently monitored for the following issues for this high-risk pregnancy and has Maternal varicella, non-immune; S/P cesarean section for arrest of descent; Supervision of other normal pregnancy, antepartum; Gastroesophageal reflux disease with esophagitis without hemorrhage; Obesity affecting pregnancy in third trimester, antepartum; Hx of cholelithiasis; Alpha thalassemia silent carrier; Elevated AFP; Language barrier; Sleep apnea; BMI 50.0-59.9, adult (HCC); and [redacted] weeks gestation of pregnancy on their problem list.  Patient reports contractions since 1 week ago that occur every 10-15 minutes. Difficult to talk through contractions at times.  Contractions: Irregular. Vag. Bleeding: None.  Movement: Present. Denies leaking of fluid. Has scheduled elective repeat CS on 8/24 with Dr. Adrian Blackwater.  Pt also has concern of slight discomfort in left axilla. Of note, breast US wnl in 05/2019.  The following portions of the patient's history were reviewed and updated as appropriate: allergies, current medications, past family history, past medical history, past social history, past surgical history and problem list.   Objective:   Vitals:   11/04/19 0939  BP: 112/73  Pulse: 100  Weight: 279 lb 9.6 oz (126.8 kg)    Fetal Status: Fetal Heart Rate (bpm): 168   Movement: Present     General:  Alert, oriented and cooperative. Patient is in no acute distress.  Skin: Skin is warm and dry. No rash noted.   Breasts Mild tenderness to palpation along left axilla without evidence of enlarged lymph nodes or superficial nodules.  Cardiovascular: Normal heart rate noted  Respiratory: Normal respiratory effort, no problems with respiration noted  Abdomen: Soft, gravid, appropriate for gestational age.  Pain/Pressure: Present     Pelvic: Cervical exam performed in the presence of a  chaperone      1.5/thick/high  Extremities: Normal range of motion.  Edema: Trace  Mental Status: Normal mood and affect. Normal behavior. Normal judgment and thought content.   Assessment and Plan:  Pregnancy: G2P1001 at [redacted]w[redacted]d  1. Supervision of other normal pregnancy, [redacted] weeks gestation: No red flag symptoms today. Pt endorses +FM and appropriate FHTs today. Size > dates, however recent growth ultrasound @[redacted]w[redacted]d  showed normal growth with EFW 49%, 2807g. - plan for f/u prenatal appt in 1 week if still pregnant - plan for rCS on 8/24; strict return precautions for labor as noted below  2. S/P cesarean section for arrest of descent:  - pt resigned BTL consent form today given prior form consider invalid - plan for scheduled elective repeat CS on 8/24 with Dr. 9/24 - provided strict return precautions for labor signs/symptoms  3. Maternal varicella, non-immune - plan for varicella vaccine s/p delivery  4. Obesity affecting pregnancy in third trimester, antepartum - counseled on continued healthy lifestyle modifications for during and after pregnancy - continue ASA for preeclampsia ppx  5. Axillary Pain, Left: Pt reports discomfort along left axilla. Overall unremarkable exam today and recent breast ultrasound in 05/2019 was unremarkable. - provided reassurance today and counseled to discuss at postpartum appt if persistent symptoms   Term labor symptoms and general obstetric precautions including but not limited to vaginal bleeding, contractions, leaking of fluid and fetal movement were reviewed in detail with the patient. Please refer to After Visit Summary for other counseling recommendations.   Return in about 1 week (around 11/11/2019) for in person OB visit in 1wk.  Future Appointments  Date Time Provider Department Center  11/11/2019  8:30 AM Brock Bad, MD [facility] None  11/15/2019  8:20 AM [facility] 1 [facility] None    Sheila Oats, MD  OB Fellow, Faculty Practice

## 2019-11-11 ENCOUNTER — Telehealth (INDEPENDENT_AMBULATORY_CARE_PROVIDER_SITE_OTHER): Payer: Medicaid Other | Admitting: Obstetrics

## 2019-11-11 ENCOUNTER — Encounter: Payer: Self-pay | Admitting: Obstetrics

## 2019-11-11 DIAGNOSIS — Z3A38 38 weeks gestation of pregnancy: Secondary | ICD-10-CM | POA: Diagnosis not present

## 2019-11-11 DIAGNOSIS — O34219 Maternal care for unspecified type scar from previous cesarean delivery: Secondary | ICD-10-CM | POA: Diagnosis not present

## 2019-11-11 DIAGNOSIS — Z3480 Encounter for supervision of other normal pregnancy, unspecified trimester: Secondary | ICD-10-CM

## 2019-11-11 DIAGNOSIS — O99213 Obesity complicating pregnancy, third trimester: Secondary | ICD-10-CM

## 2019-11-11 DIAGNOSIS — Z98891 History of uterine scar from previous surgery: Secondary | ICD-10-CM

## 2019-11-11 DIAGNOSIS — E669 Obesity, unspecified: Secondary | ICD-10-CM | POA: Diagnosis not present

## 2019-11-11 DIAGNOSIS — Z789 Other specified health status: Secondary | ICD-10-CM

## 2019-11-11 DIAGNOSIS — Z302 Encounter for sterilization: Secondary | ICD-10-CM

## 2019-11-11 NOTE — Progress Notes (Signed)
   TELEHEALTH OBSTETRICS VISIT ENCOUNTER NOTE  I connected with Lisa Herrera on 11/11/19 at  8:30 AM EDT by telephone at home and verified that I am speaking with the correct person using two identifiers.   I discussed the limitations, risks, security and privacy concerns of performing an evaluation and management service by telephone and the availability of in person appointments. I also discussed with the patient that there may be a patient responsible charge related to this service. The patient expressed understanding and agreed to proceed.  Subjective:  Lisa Herrera is a 24 y.o. G2P1001 at [redacted]w[redacted]d being followed for ongoing prenatal care.  She is currently monitored for the following issues for this low-risk pregnancy and has Maternal varicella, non-immune; S/P cesarean section for arrest of descent; Supervision of other normal pregnancy, antepartum; Gastroesophageal reflux disease with esophagitis without hemorrhage; Obesity affecting pregnancy in third trimester, antepartum; Hx of cholelithiasis; Alpha thalassemia silent carrier; Elevated AFP; Language barrier; Sleep apnea; BMI 50.0-59.9, adult (HCC); [redacted] weeks gestation of pregnancy; and Axillary pain, left on their problem list.  Patient reports occasional contractions. Reports fetal movement. Denies any contractions, bleeding or leaking of fluid.   The following portions of the patient's history were reviewed and updated as appropriate: allergies, current medications, past family history, past medical history, past social history, past surgical history and problem list.   Objective:   General:  Alert, oriented and cooperative.   Mental Status: Normal mood and affect perceived. Normal judgment and thought content.  Rest of physical exam deferred due to type of encounter  Assessment and Plan:  Pregnancy: G2P1001 at [redacted]w[redacted]d 1. Supervision of other normal pregnancy, antepartum  2. S/P cesarean section for arrest of descent  3. Declines VBAC  (vaginal birth after cesarean) trial  4. Request for sterilization - BTL papers signed  5. Obesity affecting pregnancy in third trimester, antepartum  6. Language barrier - interpreter present   Term labor symptoms and general obstetric precautions including but not limited to vaginal bleeding, contractions, leaking of fluid and fetal movement were reviewed in detail with the patient.  I discussed the assessment and treatment plan with the patient. The patient was provided an opportunity to ask questions and all were answered. The patient agreed with the plan and demonstrated an understanding of the instructions. The patient was advised to call back or seek an in-person office evaluation/go to [facility]U at [facility] for any urgent or concerning symptoms. Please refer to After Visit Summary for other counseling recommendations.   I provided 10 minutes of non-face-to-face time during this encounter.  Return in about 4 weeks (around 12/09/2019) for postpartum visit.  Future Appointments  Date Time Provider Department Center  11/15/2019  8:20 AM [facility] None    Coral Ceo, MD [facility] 11/11/19

## 2019-11-11 NOTE — Progress Notes (Signed)
MyChart patient c/o pressure and contractions 10 minutes apart.

## 2019-11-15 ENCOUNTER — Other Ambulatory Visit: Payer: Self-pay

## 2019-11-15 ENCOUNTER — Ambulatory Visit (HOSPITAL_COMMUNITY)
Admission: RE | Admit: 2019-11-15 | Discharge: 2019-11-15 | Disposition: A | Discharge status: Home / Self Care | Payer: Medicaid Other | Source: Ambulatory Visit | Attending: Obstetrics and Gynecology | Admitting: Obstetrics and Gynecology

## 2019-11-15 DIAGNOSIS — Z01812 Encounter for preprocedural laboratory examination: Secondary | ICD-10-CM | POA: Insufficient documentation

## 2019-11-15 DIAGNOSIS — Z20822 Contact with and (suspected) exposure to covid-19: Secondary | ICD-10-CM | POA: Diagnosis not present

## 2019-11-15 LAB — CBC
HCT: 35.1 % — ABNORMAL LOW (ref 36.0–46.0)
Hemoglobin: 10.9 g/dL — ABNORMAL LOW (ref 12.0–15.0)
MCH: 26.7 pg (ref 26.0–34.0)
MCHC: 31.1 g/dL (ref 30.0–36.0)
MCV: 85.8 fL (ref 80.0–100.0)
Platelets: 224 10*3/uL (ref 150–400)
RBC: 4.09 MIL/uL (ref 3.87–5.11)
RDW: 14.1 % (ref 11.5–15.5)
WBC: 8.1 10*3/uL (ref 4.0–10.5)
nRBC: 0 % (ref 0.0–0.2)

## 2019-11-15 LAB — SARS CORONAVIRUS 2 (TAT 6-24 HRS): SARS Coronavirus 2: NEGATIVE

## 2019-11-15 LAB — RAPID HIV SCREEN (HIV 1/2 AB+AG)
HIV 1/2 Antibodies: NONREACTIVE
HIV-1 P24 Antigen - HIV24: NONREACTIVE

## 2019-11-15 LAB — RPR: RPR Ser Ql: NONREACTIVE

## 2019-11-15 NOTE — MAU Note (Signed)
Pt here for covid swab and lab draw. Denies symptoms or sick contacts. Swab collected.  

## 2019-11-16 ENCOUNTER — Encounter (HOSPITAL_COMMUNITY): Payer: Self-pay | Admitting: Family Medicine

## 2019-11-16 NOTE — Anesthesia Preprocedure Evaluation (Addendum)
Anesthesia Evaluation  Patient identified by MRN, date of birth, ID band Patient awake    Reviewed: Allergy & Precautions, NPO status , Patient's Chart, lab work & pertinent test results  Airway Mallampati: III  TM Distance: >3 FB Neck ROM: Full    Dental  (+) Teeth Intact, Dental Advisory Given   Pulmonary sleep apnea ,  Non-compliant with CPAP   Pulmonary exam normal breath sounds clear to auscultation       Cardiovascular negative cardio ROS Normal cardiovascular exam Rhythm:Regular Rate:Normal     Neuro/Psych  Headaches, negative psych ROS   GI/Hepatic Neg liver ROS, GERD  Medicated and Controlled,  Endo/Other  Morbid obesityBMI 51  Renal/GU negative Renal ROS  negative genitourinary   Musculoskeletal negative musculoskeletal ROS (+)   Abdominal (+) + obese,   Peds  Hematology  (+) Blood dyscrasia, anemia , hct 35.9, plt 224   Anesthesia Other Findings   Reproductive/Obstetrics (+) Pregnancy 1 prior c section 2017- labored with epidural x 24h first, needed blood transfusion intraop Here for scheduled rpt csection, but actively contracting upon arrival                            Anesthesia Physical Anesthesia Plan  ASA: III  Anesthesia Plan: Spinal   Post-op Pain Management:    Induction:   PONV Risk Score and Plan: 3 and Ondansetron, Dexamethasone and Treatment may vary due to age or medical condition  Airway Management Planned: Natural Airway and Nasal Cannula  Additional Equipment: None  Intra-op Plan:   Post-operative Plan:   Informed Consent: I have reviewed the patients History and Physical, chart, labs and discussed the procedure including the risks, benefits and alternatives for the proposed anesthesia with the patient or authorized representative who has indicated his/her understanding and acceptance.     Dental advisory given and Interpreter used for  interveiw  Plan Discussed with: CRNA  Anesthesia Plan Comments: (FOB at bedside interpreting )       Anesthesia Quick Evaluation

## 2019-11-17 ENCOUNTER — Other Ambulatory Visit: Payer: Self-pay

## 2019-11-17 ENCOUNTER — Encounter (HOSPITAL_COMMUNITY)
Admission: RE | Disposition: A | Discharge status: Home / Self Care | Payer: Self-pay | Source: Home / Self Care | Attending: Family Medicine

## 2019-11-17 ENCOUNTER — Inpatient Hospital Stay (HOSPITAL_COMMUNITY): Payer: Medicaid Other | Admitting: Anesthesiology

## 2019-11-17 ENCOUNTER — Encounter (HOSPITAL_COMMUNITY): Payer: Self-pay | Admitting: Family Medicine

## 2019-11-17 ENCOUNTER — Inpatient Hospital Stay (HOSPITAL_COMMUNITY)
Admission: RE | Admit: 2019-11-17 | Discharge: 2019-11-19 | DRG: 784 | Disposition: A | Discharge status: Home / Self Care | Payer: Medicaid Other | Attending: Family Medicine | Admitting: Family Medicine

## 2019-11-17 DIAGNOSIS — O99824 Streptococcus B carrier state complicating childbirth: Secondary | ICD-10-CM | POA: Diagnosis present

## 2019-11-17 DIAGNOSIS — Z3480 Encounter for supervision of other normal pregnancy, unspecified trimester: Secondary | ICD-10-CM

## 2019-11-17 DIAGNOSIS — Z9851 Tubal ligation status: Secondary | ICD-10-CM

## 2019-11-17 DIAGNOSIS — R42 Dizziness and giddiness: Secondary | ICD-10-CM | POA: Diagnosis not present

## 2019-11-17 DIAGNOSIS — M545 Low back pain: Secondary | ICD-10-CM | POA: Diagnosis not present

## 2019-11-17 DIAGNOSIS — O99893 Other specified diseases and conditions complicating puerperium: Secondary | ICD-10-CM | POA: Diagnosis not present

## 2019-11-17 DIAGNOSIS — K2100 Gastro-esophageal reflux disease with esophagitis, without bleeding: Secondary | ICD-10-CM | POA: Diagnosis present

## 2019-11-17 DIAGNOSIS — O34211 Maternal care for low transverse scar from previous cesarean delivery: Principal | ICD-10-CM | POA: Diagnosis present

## 2019-11-17 DIAGNOSIS — E669 Obesity, unspecified: Secondary | ICD-10-CM | POA: Diagnosis present

## 2019-11-17 DIAGNOSIS — O99354 Diseases of the nervous system complicating childbirth: Secondary | ICD-10-CM | POA: Diagnosis present

## 2019-11-17 DIAGNOSIS — O9962 Diseases of the digestive system complicating childbirth: Secondary | ICD-10-CM | POA: Diagnosis present

## 2019-11-17 DIAGNOSIS — D563 Thalassemia minor: Secondary | ICD-10-CM | POA: Diagnosis present

## 2019-11-17 DIAGNOSIS — Z789 Other specified health status: Secondary | ICD-10-CM | POA: Diagnosis present

## 2019-11-17 DIAGNOSIS — G4730 Sleep apnea, unspecified: Secondary | ICD-10-CM | POA: Diagnosis present

## 2019-11-17 DIAGNOSIS — R772 Abnormality of alphafetoprotein: Secondary | ICD-10-CM | POA: Diagnosis present

## 2019-11-17 DIAGNOSIS — Z6841 Body Mass Index (BMI) 40.0 and over, adult: Secondary | ICD-10-CM

## 2019-11-17 DIAGNOSIS — O99214 Obesity complicating childbirth: Secondary | ICD-10-CM | POA: Diagnosis present

## 2019-11-17 DIAGNOSIS — O99213 Obesity complicating pregnancy, third trimester: Secondary | ICD-10-CM | POA: Diagnosis present

## 2019-11-17 DIAGNOSIS — Z98891 History of uterine scar from previous surgery: Secondary | ICD-10-CM

## 2019-11-17 DIAGNOSIS — Z302 Encounter for sterilization: Secondary | ICD-10-CM

## 2019-11-17 DIAGNOSIS — R519 Headache, unspecified: Secondary | ICD-10-CM | POA: Diagnosis not present

## 2019-11-17 DIAGNOSIS — Z8719 Personal history of other diseases of the digestive system: Secondary | ICD-10-CM

## 2019-11-17 DIAGNOSIS — Z3A39 39 weeks gestation of pregnancy: Secondary | ICD-10-CM

## 2019-11-17 DIAGNOSIS — Z2839 Other underimmunization status: Secondary | ICD-10-CM

## 2019-11-17 HISTORY — PX: TUBAL LIGATION: SHX77

## 2019-11-17 LAB — PREPARE RBC (CROSSMATCH)

## 2019-11-17 SURGERY — Surgical Case
Anesthesia: Spinal

## 2019-11-17 MED ORDER — HYDROMORPHONE HCL 1 MG/ML IJ SOLN: 0.2000 mg | INTRAMUSCULAR | Status: DC | PRN

## 2019-11-17 MED ORDER — MORPHINE SULFATE (PF) 0.5 MG/ML IJ SOLN
INTRAMUSCULAR | Status: DC | PRN
Start: 2019-11-17 — End: 2019-11-17
  Administered 2019-11-17: .15 mg via EPIDURAL

## 2019-11-17 MED ORDER — ONDANSETRON HCL 4 MG/2ML IJ SOLN
4.0000 mg | Freq: Three times a day (TID) | INTRAMUSCULAR | Status: DC | PRN
  Administered 2019-11-17: 4 mg via INTRAVENOUS

## 2019-11-17 MED ORDER — KETOROLAC TROMETHAMINE 30 MG/ML IJ SOLN
30.0000 mg | Freq: Four times a day (QID) | INTRAMUSCULAR | Status: AC
  Administered 2019-11-17 – 2019-11-18 (×3): 30 mg via INTRAVENOUS
  Filled 2019-11-17 (×3): qty 1

## 2019-11-17 MED ORDER — OXYCODONE HCL 5 MG/5ML PO SOLN: 5.0000 mg | Freq: Once | ORAL | Status: DC | PRN

## 2019-11-17 MED ORDER — SENNOSIDES-DOCUSATE SODIUM 8.6-50 MG PO TABS
2.0000 | ORAL_TABLET | ORAL | Status: DC
  Administered 2019-11-18 (×2): 2 via ORAL
  Filled 2019-11-17 (×2): qty 2

## 2019-11-17 MED ORDER — TRANEXAMIC ACID-NACL 1000-0.7 MG/100ML-% IV SOLN
INTRAVENOUS | Status: DC | PRN
  Administered 2019-11-17: 1000 mg via INTRAVENOUS

## 2019-11-17 MED ORDER — NALBUPHINE HCL 10 MG/ML IJ SOLN: 5.0000 mg | Freq: Once | INTRAMUSCULAR | Status: DC | PRN

## 2019-11-17 MED ORDER — NALOXONE HCL 4 MG/10ML IJ SOLN
1.0000 ug/kg/h | INTRAVENOUS | Status: DC | PRN
  Filled 2019-11-17: qty 5

## 2019-11-17 MED ORDER — SIMETHICONE 80 MG PO CHEW: 80.0000 mg | CHEWABLE_TABLET | Freq: Three times a day (TID) | ORAL | Status: DC

## 2019-11-17 MED ORDER — OXYTOCIN-SODIUM CHLORIDE 30-0.9 UT/500ML-% IV SOLN
INTRAVENOUS | Status: DC | PRN
  Administered 2019-11-17 (×2): 30 [IU] via INTRAVENOUS

## 2019-11-17 MED ORDER — SIMETHICONE 80 MG PO CHEW: 80.0000 mg | CHEWABLE_TABLET | ORAL | Status: DC | PRN

## 2019-11-17 MED ORDER — PROMETHAZINE HCL 25 MG/ML IJ SOLN: 6.2500 mg | INTRAMUSCULAR | Status: DC | PRN

## 2019-11-17 MED ORDER — SIMETHICONE 80 MG PO CHEW: 80.0000 mg | CHEWABLE_TABLET | ORAL | Status: DC

## 2019-11-17 MED ORDER — DEXTROSE 5 % IV SOLN
3.0000 g | INTRAVENOUS | Status: AC
  Administered 2019-11-17: 3 g via INTRAVENOUS

## 2019-11-17 MED ORDER — MEPERIDINE HCL 25 MG/ML IJ SOLN: 6.2500 mg | INTRAMUSCULAR | Status: DC | PRN

## 2019-11-17 MED ORDER — MORPHINE SULFATE (PF) 0.5 MG/ML IJ SOLN
INTRAMUSCULAR | Status: AC
  Filled 2019-11-17: qty 10

## 2019-11-17 MED ORDER — FENTANYL CITRATE (PF) 100 MCG/2ML IJ SOLN
INTRAMUSCULAR | Status: AC
  Filled 2019-11-17: qty 2

## 2019-11-17 MED ORDER — WITCH HAZEL-GLYCERIN EX PADS: 1.0000 "application " | MEDICATED_PAD | CUTANEOUS | Status: DC | PRN

## 2019-11-17 MED ORDER — STERILE WATER FOR IRRIGATION IR SOLN
Status: DC | PRN
  Administered 2019-11-17: 1

## 2019-11-17 MED ORDER — ACETAMINOPHEN 10 MG/ML IV SOLN
1000.0000 mg | Freq: Once | INTRAVENOUS | Status: AC
  Administered 2019-11-17: 1000 mg via INTRAVENOUS

## 2019-11-17 MED ORDER — DIPHENHYDRAMINE HCL 25 MG PO CAPS: 25.0000 mg | ORAL_CAPSULE | Freq: Four times a day (QID) | ORAL | Status: DC | PRN

## 2019-11-17 MED ORDER — KETOROLAC TROMETHAMINE 30 MG/ML IJ SOLN
30.0000 mg | Freq: Four times a day (QID) | INTRAMUSCULAR | Status: DC | PRN
  Administered 2019-11-17: 30 mg via INTRAMUSCULAR

## 2019-11-17 MED ORDER — KETOROLAC TROMETHAMINE 30 MG/ML IJ SOLN: 30.0000 mg | Freq: Four times a day (QID) | INTRAMUSCULAR | Status: DC | PRN

## 2019-11-17 MED ORDER — ACETAMINOPHEN 500 MG PO TABS: 1000.0000 mg | ORAL_TABLET | Freq: Four times a day (QID) | ORAL | Status: DC

## 2019-11-17 MED ORDER — OXYCODONE HCL 5 MG PO TABS: 5.0000 mg | ORAL_TABLET | Freq: Once | ORAL | Status: DC | PRN

## 2019-11-17 MED ORDER — IBUPROFEN 800 MG PO TABS
800.0000 mg | ORAL_TABLET | Freq: Four times a day (QID) | ORAL | Status: DC
  Administered 2019-11-18 – 2019-11-19 (×5): 800 mg via ORAL
  Filled 2019-11-17 (×5): qty 1

## 2019-11-17 MED ORDER — NALOXONE HCL 0.4 MG/ML IJ SOLN: 0.4000 mg | INTRAMUSCULAR | Status: DC | PRN

## 2019-11-17 MED ORDER — DEXAMETHASONE SODIUM PHOSPHATE 10 MG/ML IJ SOLN
INTRAMUSCULAR | Status: AC
  Filled 2019-11-17: qty 1

## 2019-11-17 MED ORDER — ALBUMIN HUMAN 5 % IV SOLN
INTRAVENOUS | Status: AC
  Filled 2019-11-17: qty 250

## 2019-11-17 MED ORDER — DIPHENHYDRAMINE HCL 25 MG PO CAPS: 25.0000 mg | ORAL_CAPSULE | ORAL | Status: DC | PRN

## 2019-11-17 MED ORDER — FENTANYL CITRATE (PF) 100 MCG/2ML IJ SOLN
INTRAMUSCULAR | Status: DC | PRN
  Administered 2019-11-17: 15 ug via INTRATHECAL

## 2019-11-17 MED ORDER — SCOPOLAMINE 1 MG/3DAYS TD PT72
MEDICATED_PATCH | TRANSDERMAL | Status: AC
  Filled 2019-11-17: qty 1

## 2019-11-17 MED ORDER — DIBUCAINE (PERIANAL) 1 % EX OINT: 1.0000 "application " | TOPICAL_OINTMENT | CUTANEOUS | Status: DC | PRN

## 2019-11-17 MED ORDER — ACETAMINOPHEN 10 MG/ML IV SOLN
INTRAVENOUS | Status: AC
  Filled 2019-11-17: qty 100

## 2019-11-17 MED ORDER — LACTATED RINGERS IV SOLN: INTRAVENOUS | Status: DC

## 2019-11-17 MED ORDER — MENTHOL 3 MG MT LOZG: 1.0000 | LOZENGE | OROMUCOSAL | Status: DC | PRN

## 2019-11-17 MED ORDER — ENOXAPARIN SODIUM 80 MG/0.8ML ~~LOC~~ SOLN
65.0000 mg | SUBCUTANEOUS | Status: DC
  Administered 2019-11-18 – 2019-11-19 (×2): 65 mg via SUBCUTANEOUS
  Filled 2019-11-17 (×2): qty 0.8

## 2019-11-17 MED ORDER — KETOROLAC TROMETHAMINE 30 MG/ML IJ SOLN
INTRAMUSCULAR | Status: AC
  Filled 2019-11-17: qty 1

## 2019-11-17 MED ORDER — DIPHENHYDRAMINE HCL 50 MG/ML IJ SOLN: 12.5000 mg | INTRAMUSCULAR | Status: DC | PRN

## 2019-11-17 MED ORDER — KETOROLAC TROMETHAMINE 30 MG/ML IJ SOLN: 30.0000 mg | Freq: Once | INTRAMUSCULAR | Status: DC | PRN

## 2019-11-17 MED ORDER — ACETAMINOPHEN 500 MG PO TABS
1000.0000 mg | ORAL_TABLET | Freq: Four times a day (QID) | ORAL | Status: DC
  Administered 2019-11-17 – 2019-11-19 (×7): 1000 mg via ORAL
  Filled 2019-11-17 (×8): qty 2

## 2019-11-17 MED ORDER — COCONUT OIL OIL
1.0000 "application " | TOPICAL_OIL | Status: DC | PRN
  Administered 2019-11-18: 1 via TOPICAL

## 2019-11-17 MED ORDER — OXYTOCIN-SODIUM CHLORIDE 30-0.9 UT/500ML-% IV SOLN
INTRAVENOUS | Status: AC
  Filled 2019-11-17: qty 500

## 2019-11-17 MED ORDER — DEXAMETHASONE SODIUM PHOSPHATE 10 MG/ML IJ SOLN
INTRAMUSCULAR | Status: DC | PRN
  Administered 2019-11-17: 10 mg via INTRAVENOUS

## 2019-11-17 MED ORDER — OXYCODONE HCL 5 MG PO TABS: 5.0000 mg | ORAL_TABLET | ORAL | Status: DC | PRN

## 2019-11-17 MED ORDER — ONDANSETRON HCL 4 MG/2ML IJ SOLN
INTRAMUSCULAR | Status: AC
  Filled 2019-11-17: qty 2

## 2019-11-17 MED ORDER — BUPIVACAINE IN DEXTROSE 0.75-8.25 % IT SOLN
INTRATHECAL | Status: DC | PRN
  Administered 2019-11-17: 1.8 mL via INTRATHECAL

## 2019-11-17 MED ORDER — KETOROLAC TROMETHAMINE 30 MG/ML IJ SOLN: 30.0000 mg | Freq: Once | INTRAMUSCULAR | Status: DC

## 2019-11-17 MED ORDER — SODIUM CHLORIDE 0.9% IV SOLUTION: Freq: Once | INTRAVENOUS | Status: DC

## 2019-11-17 MED ORDER — SCOPOLAMINE 1 MG/3DAYS TD PT72
1.0000 | MEDICATED_PATCH | Freq: Once | TRANSDERMAL | Status: DC
  Administered 2019-11-17: 1.5 mg via TRANSDERMAL

## 2019-11-17 MED ORDER — NALBUPHINE HCL 10 MG/ML IJ SOLN: 5.0000 mg | INTRAMUSCULAR | Status: DC | PRN

## 2019-11-17 MED ORDER — OXYTOCIN-SODIUM CHLORIDE 30-0.9 UT/500ML-% IV SOLN: 2.5000 [IU]/h | INTRAVENOUS | Status: AC

## 2019-11-17 MED ORDER — LACTATED RINGERS IV SOLN
125.0000 mL/h | INTRAVENOUS | Status: DC
  Administered 2019-11-17 (×2): 125 mL/h via INTRAVENOUS

## 2019-11-17 MED ORDER — ALBUMIN HUMAN 5 % IV SOLN: INTRAVENOUS | Status: DC | PRN

## 2019-11-17 MED ORDER — PHENYLEPHRINE HCL-NACL 20-0.9 MG/250ML-% IV SOLN
INTRAVENOUS | Status: DC | PRN
  Administered 2019-11-17: 60 ug/min via INTRAVENOUS

## 2019-11-17 MED ORDER — TRANEXAMIC ACID-NACL 1000-0.7 MG/100ML-% IV SOLN
INTRAVENOUS | Status: AC
  Filled 2019-11-17: qty 100

## 2019-11-17 MED ORDER — DEXTROSE 5 % IV SOLN
INTRAVENOUS | Status: AC
  Filled 2019-11-17: qty 3000

## 2019-11-17 MED ORDER — PHENYLEPHRINE HCL-NACL 20-0.9 MG/250ML-% IV SOLN
INTRAVENOUS | Status: AC
  Filled 2019-11-17: qty 250

## 2019-11-17 MED ORDER — HYDROMORPHONE HCL 1 MG/ML IJ SOLN: 0.2500 mg | INTRAMUSCULAR | Status: DC | PRN

## 2019-11-17 MED ORDER — PRENATAL MULTIVITAMIN CH
1.0000 | ORAL_TABLET | Freq: Every day | ORAL | Status: DC
  Administered 2019-11-18 – 2019-11-19 (×2): 1 via ORAL
  Filled 2019-11-17 (×2): qty 1

## 2019-11-17 MED ORDER — POVIDONE-IODINE 10 % EX SWAB
2.0000 "application " | Freq: Once | CUTANEOUS | Status: AC
  Administered 2019-11-17: 2 via TOPICAL

## 2019-11-17 MED ORDER — SODIUM CHLORIDE 0.9% FLUSH: 3.0000 mL | INTRAVENOUS | Status: DC | PRN

## 2019-11-17 MED ORDER — TETANUS-DIPHTH-ACELL PERTUSSIS 5-2.5-18.5 LF-MCG/0.5 IM SUSP: 0.5000 mL | Freq: Once | INTRAMUSCULAR | Status: DC

## 2019-11-17 MED ORDER — SODIUM CHLORIDE 0.9 % IR SOLN
Status: DC | PRN
  Administered 2019-11-17: 1

## 2019-11-17 SURGICAL SUPPLY — 36 items
BENZOIN TINCTURE PRP APPL 2/3 (GAUZE/BANDAGES/DRESSINGS) ×3 IMPLANT
CHLORAPREP W/TINT 26ML (MISCELLANEOUS) ×3 IMPLANT
CLAMP CORD UMBIL (MISCELLANEOUS) IMPLANT
CLIP FILSHIE TUBAL LIGA STRL (Clip) ×3 IMPLANT
CLOSURE STERI STRIP 1/2 X4 (GAUZE/BANDAGES/DRESSINGS) ×3 IMPLANT
CLOTH BEACON ORANGE TIMEOUT ST (SAFETY) ×3 IMPLANT
DRSG OPSITE POSTOP 4X10 (GAUZE/BANDAGES/DRESSINGS) ×3 IMPLANT
ELECT REM PT RETURN 9FT ADLT (ELECTROSURGICAL) ×3
ELECTRODE REM PT RTRN 9FT ADLT (ELECTROSURGICAL) ×2 IMPLANT
EXTRACTOR VACUUM M CUP 4 TUBE (SUCTIONS) IMPLANT
GLOVE BIOGEL PI IND STRL 7.0 (GLOVE) ×4 IMPLANT
GLOVE BIOGEL PI IND STRL 7.5 (GLOVE) ×4 IMPLANT
GLOVE BIOGEL PI INDICATOR 7.0 (GLOVE) ×2
GLOVE BIOGEL PI INDICATOR 7.5 (GLOVE) ×2
GLOVE ECLIPSE 7.5 STRL STRAW (GLOVE) ×3 IMPLANT
GOWN STRL REUS W/TWL LRG LVL3 (GOWN DISPOSABLE) ×9 IMPLANT
HEMOSTAT ARISTA ABSORB 3G PWDR (HEMOSTASIS) ×3 IMPLANT
KIT ABG SYR 3ML LUER SLIP (SYRINGE) IMPLANT
NEEDLE HYPO 25X5/8 SAFETYGLIDE (NEEDLE) IMPLANT
NS IRRIG 1000ML POUR BTL (IV SOLUTION) ×3 IMPLANT
PACK C SECTION WH (CUSTOM PROCEDURE TRAY) ×3 IMPLANT
PAD OB MATERNITY 4.3X12.25 (PERSONAL CARE ITEMS) ×3 IMPLANT
PENCIL SMOKE EVAC W/HOLSTER (ELECTROSURGICAL) ×3 IMPLANT
RTRCTR C-SECT PINK 25CM LRG (MISCELLANEOUS) ×3 IMPLANT
STRIP CLOSURE SKIN 1/2X4 (GAUZE/BANDAGES/DRESSINGS) ×3 IMPLANT
SUT PLAIN 2 0 XLH (SUTURE) ×3 IMPLANT
SUT VIC AB 0 CTX 36 (SUTURE) ×3
SUT VIC AB 0 CTX36XBRD ANBCTRL (SUTURE) ×6 IMPLANT
SUT VIC AB 2-0 CT1 27 (SUTURE) ×1
SUT VIC AB 2-0 CT1 TAPERPNT 27 (SUTURE) ×2 IMPLANT
SUT VIC AB 3-0 CT1 27 (SUTURE) ×1
SUT VIC AB 3-0 CT1 TAPERPNT 27 (SUTURE) ×2 IMPLANT
SUT VIC AB 4-0 KS 27 (SUTURE) ×3 IMPLANT
TOWEL OR 17X24 6PK STRL BLUE (TOWEL DISPOSABLE) ×3 IMPLANT
TRAY FOLEY W/BAG SLVR 14FR LF (SET/KITS/TRAYS/PACK) ×3 IMPLANT
WATER STERILE IRR 1000ML POUR (IV SOLUTION) ×3 IMPLANT

## 2019-11-17 NOTE — Lactation Note (Signed)
This note was copied from a baby's chart. Lactation Consultation Note  Patient Name: Lisa Herrera [id number]'M Date: 11/17/2019  Baby Lisa Klausner now 5 hours old. Parents report they do not want an interpreter.  Mom reports she understands a lot and if not dad will explain it to her.  Mom reports no milk with first baby. Mom reports she never felt her milk come to volume.  Mom with no breastfeeding education and does not have a breastpump.  Manual breast pump given and demo.  Gave mom 27 mm flange to use with pump. Mom reports positive breast changes with pregnancy. Mom reprots that her breasts got bigger with this pregnancy and that they hurt and they got darker.  Mom asked about formula, worried he is not getting enough.  Reviewed normal newborn behavior.  Encouraged to only giver formula if medically indicated. Offered to assist with feeding.  Mom reports they recently tried and he would not wake up. Asked mom if we could try some hand expression and spoon feeding.  Mom agreed.  Unable to get anything to feed from spoon but did see some colostrum glistening on the right.  Urged mom to continue with massage and hand expression for stimulation.  Reviewed and gave [facility] Breastfeeding Consultation Services handout.  Reviewed yellow feeding log.  Praised breastfeeding efforts.  Urged to offer the breast on demand/cues and at least 8-12 times day.  Reviewed cues with parents.  Urged to call lactation as needed.    Davine Sweney Michaelle Copas 11/17/2019, 7:42 PM

## 2019-11-17 NOTE — Transfer of Care (Signed)
Immediate Anesthesia Transfer of Care Note  Patient: Lisa Herrera  Procedure(s) Performed: CESAREAN SECTION (N/A ) BILATERAL TUBAL LIGATION (Bilateral )  Patient Location: PACU  Anesthesia Type:Spinal  Level of Consciousness: awake, alert , oriented and patient cooperative  Airway & Oxygen Therapy: Patient Spontanous Breathing  Post-op Assessment: Report given to RN and Post -op Vital signs reviewed and stable  Post vital signs: Reviewed and stable  Last Vitals:  Vitals Value Taken Time  BP 115/57 11/17/19 1123  Temp    Pulse 71 11/17/19 1126  Resp 16 11/17/19 1126  SpO2 100 % 11/17/19 1126  Vitals shown include unvalidated device data.  Last Pain:  Vitals:   11/17/19 0728  TempSrc: Oral  PainSc: 6          Complications: No complications documented.

## 2019-11-17 NOTE — Discharge Summary (Addendum)
   Postpartum Discharge Summary     Patient Name: Lisa Herrera DOB: 07/12/1995 MRN: [id number]  Date of admission: 11/17/2019 Delivery date:11/17/2019  Delivering provider: STINSON, JACOB J  Date of discharge: 11/19/2019  Admitting diagnosis: Cesarean delivery delivered [O82] Intrauterine pregnancy: [redacted]w[redacted]d     Secondary diagnosis:  Active Problems:   Maternal varicella, non-immune   S/P cesarean section for arrest of descent   Supervision of other normal pregnancy, antepartum   Gastroesophageal reflux disease with esophagitis without hemorrhage   Obesity affecting pregnancy in third trimester, antepartum   Hx of cholelithiasis   Alpha thalassemia silent carrier   Elevated AFP   Language barrier   Sleep apnea   BMI 50.0-59.9, adult (HCC)   Cesarean delivery delivered   History of bilateral tubal ligation  Additional problems: none    Discharge diagnosis: Term Pregnancy Delivered                                              Post partum procedures:postpartum tubal ligation Augmentation: N/A Complications: None  Hospital course: Sceduled C/S   24 y.o. yo G2P2002 at [redacted]w[redacted]d was admitted to the hospital 11/17/2019 for scheduled cesarean section with the following indication:Elective Repeat.Delivery details are as follows:  Membrane Rupture Time/Date: 10:16 AM ,11/17/2019   Delivery Method:C-Section, Low Transverse  Details of operation can be found in separate operative note.  Patient had an uncomplicated postpartum course.  She is ambulating, tolerating a regular diet, passing flatus, and urinating well. Patient is discharged home in stable condition on  11/19/19        Newborn Data: Birth date:11/17/2019  Birth time:10:16 AM  Gender:Female  Living status:Living  Apgars:9 ,10  Weight:3498 g     Magnesium Sulfate received: No BMZ received: No Rhophylac:N/A MMR:N/A T-DaP:Given prenatally Flu: N/A Transfusion: IV iron s/p delivery given Hgb 10.9 >7.8  Physical exam  Vitals:    11/18/19 0515 11/18/19 1335 11/18/19 2014 11/19/19 0539  BP: (!) 114/55 127/68 (!) 105/50 120/69  Pulse: 71 80 83 84  Resp: 18 16 17 18  Temp: 97.8 F (36.6 C) 98.9 F (37.2 C) 98.3 F (36.8 C) 98.7 F (37.1 C)  TempSrc: Oral Oral Oral Oral  SpO2: 100%  100% 99%  Height:       General: alert, cooperative and no distress Lochia: appropriate Uterine Fundus: firm Incision: Healing well with no significant drainage DVT Evaluation: No evidence of DVT seen on physical exam. Labs: Lab Results  Component Value Date   WBC 10.9 (H) 11/18/2019   HGB 7.8 (L) 11/18/2019   HCT 25.1 (L) 11/18/2019   MCV 86.9 11/18/2019   PLT 186 11/18/2019   CMP Latest Ref Rng & Units 10/05/2019  Glucose 70 - 99 mg/dL 106(H)  BUN 6 - 20 mg/dL <5(L)  Creatinine 0.44 - 1.00 mg/dL 0.48  Sodium 135 - 145 mmol/L 138  Potassium 3.5 - 5.1 mmol/L 3.9  Chloride 98 - 111 mmol/L 108  CO2 22 - 32 mmol/L 22  Calcium 8.9 - 10.3 mg/dL 8.6(L)  Total Protein 6.5 - 8.1 g/dL 5.4(L)  Total Bilirubin 0.3 - 1.2 mg/dL 0.3  Alkaline Phos 38 - 126 U/L 107  AST 15 - 41 U/L 13(L)  ALT 0 - 44 U/L 11   Edinburgh Score: Edinburgh Postnatal Depression Scale Screening Tool 11/17/2019  I have been able to laugh and see   the funny side of things. 1  I have looked forward with enjoyment to things. 1  I have blamed myself unnecessarily when things went wrong. 0  I have been anxious or worried for no good reason. 2  I have felt scared or panicky for no good reason. 2  Things have been getting on top of me. 1  I have been so unhappy that I have had difficulty sleeping. 0  I have felt sad or miserable. 0  I have been so unhappy that I have been crying. 0  The thought of harming myself has occurred to me. 0  Edinburgh Postnatal Depression Scale Total 7     After visit meds:  Allergies as of 11/19/2019   No Known Allergies      Medication List     STOP taking these medications    aspirin EC 81 MG tablet   Comfort Fit  Maternity Supp Lg Misc   folic acid 1 MG tablet Commonly known as: FOLVITE   FOLIC ACID PO   omeprazole 20 MG capsule Commonly known as: PRILOSEC       TAKE these medications    acetaminophen 325 MG tablet Commonly known as: TYLENOL Take 650 mg by mouth every 6 (six) hours as needed for moderate pain or headache.   Blood Pressure Kit Devi 1 kit by Does not apply route once a week. Check Blood Pressure regularly and record readings into the [employer] App.  Large Cuff.  DX O90.0   coconut oil Oil Apply 1 application topically as needed (nipple pain).   ibuprofen 800 MG tablet Commonly known as: ADVIL Take 1 tablet (800 mg total) by mouth every 6 (six) hours.   prenatal multivitamin Tabs tablet Take 1 tablet by mouth daily.               Discharge Care Instructions  (From admission, onward)           Start     Ordered   11/19/19 0000  Leave dressing on - Keep it clean, dry, and intact until clinic visit        11/19/19 1211             Discharge home in stable condition Infant Feeding: Bottle and Breast Infant Disposition:home with mother Discharge instruction: per After Visit Summary and Postpartum booklet. Activity: Advance as tolerated. Pelvic rest for 6 weeks.  Diet: routine diet Future Appointments: Future Appointments  Date Time Provider [place]  11/24/2019  1:20 PM [place] None  12/15/2019  1:00 PM Anyanwu, Sallyanne Havers, MD [place] None   Follow up Visit:    Please schedule this patient for a In person postpartum visit in 4 weeks with the following provider: Any provider. Additional Postpartum F/U:Incision check 1 week  Low risk pregnancy complicated by:  n/a Delivery mode:  C-Section, Low Transverse  Anticipated Birth Control:  BTL done [facility]   11/19/2019 Matilde Haymaker, MD  Attestation of Supervision of Student:  I confirm that I have verified the information documented in the  resident's  note and that I have also  personally reperformed the history, physical exam and all medical decision making activities.  I have verified that all services and findings are accurately documented in this student's note; and I agree with management and plan as outlined in the documentation. I have also made any necessary editorial changes.  Randa Ngo, [place] for [facility], [facility] 11/19/2019 8:40 PM

## 2019-11-17 NOTE — Op Note (Addendum)
Lisa Herrera PROCEDURE DATE: 11/17/2019  PREOPERATIVE DIAGNOSIS: Intrauterine pregnancy at  [redacted]w[redacted]d weeks gestation;  prior LTCS , elective repeat cesarean section  POSTOPERATIVE DIAGNOSIS: The same  PROCEDURE: repeat Low Transverse Cesarean Section and bilateral tubal ligation  SURGEON:  Dr. Candelaria Celeste  ASSISTANT: Dr. Mart Piggs, Dr Mirian Mo  INDICATIONS: Lisa Herrera is a 24 y.o. [id number] at [redacted]w[redacted]d scheduled for cesarean section secondary to  elective repeat cesarean section .  The risks of cesarean section discussed with the patient included but were not limited to: bleeding which may require transfusion or reoperation; infection which may require antibiotics; injury to bowel, bladder, ureters or other surrounding organs; injury to the fetus; need for additional procedures including hysterectomy in the event of a life-threatening hemorrhage; placental abnormalities wth subsequent pregnancies, incisional problems, thromboembolic phenomenon and other postoperative/anesthesia complications. The patient concurred with the proposed plan, giving informed written consent for the procedure.    FINDINGS:  Viable female infant in cephalic presentation.  Apgars 9 and 10, weight pending.  Meconium stained amniotic fluid.  Intact placenta, three vessel cord.  Normal uterus, fallopian tubes and ovaries bilaterally.  ANESTHESIA:    Spinal INTRAVENOUS FLUIDS:2000 ml ESTIMATED BLOOD LOSS: 930 ml  URINE OUTPUT:  300 ml SPECIMENS: Placenta sent to L&D COMPLICATIONS: None immediate  PROCEDURE IN DETAIL:  The patient received intravenous antibiotics and had sequential compression devices applied to her lower extremities while in the preoperative area.  She was then taken to the operating room where spinal anesthesia was administered and was found to be adequate. She was then placed in a dorsal supine position with a leftward tilt, and prepped and draped in a sterile manner.  A foley catheter was placed  into her bladder and attached to constant gravity, which drained clear fluid throughout.  After an adequate timeout was performed, a Pfannenstiel skin incision was made over prior incision with scalpel and carried through to the underlying layer of fascia. The fascia was incised in the midline and this incision was extended bilaterally using the Mayo scissors. Kocher clamps were applied to the superior aspect of the fascial incision and the underlying rectus muscles were dissected off bluntly and sharply with mayo scissors. The rectus muscles were separated in the midline bluntly and the peritoneum was entered bluntly. Upon entrance, bleeding from a blood vessel in the preperitoneal fat. This was clamped with Kelly clamp and tied off with a 2-0 vicryl with adequate hemostasis. Adhesions were brought down using Metzenbaum scissors and a bladder blade was placed.  Attention was turned to the lower uterine segment where a transverse hysterotomy was made with a scalpel and extended bilaterally bluntly. An allis was used to rupture the amniotic sac, and meconium stained fluid was present. The infant was successfully delivered, and cord was clamped and cut and infant was handed over to awaiting neonatology team. Uterine massage was then administered and the placenta delivered intact with three-vessel cord. The uterus was then cleared of clot and debris.  The hysterotomy was closed with 0 Vicryl in a running locked fashion, and an imbricating layer was also placed with a 0 Vicryl. Overall, excellent hemostasis was noted. The abdomen and the pelvis were cleared of all clot and debris. Hemostasis was confirmed on all surfaces.    Filshie Clips:  The left Fallopian tube was identified by identifying the left fimbriae and tracing the fimbriae back towards the uterus. The left fallopian tube was then grasped with the Babcock clamp. An avascular midsection of the  tube was grasped with the babcock clamps and the filshie clip  was applied, taking care to incorporate the entire tube.  Attention was then turned to the right fallopian tube after confirmation by tracing the tube back from the fimbriae. The same procedure was then performed on the right Fallopian tube, with excellent hemostasis was noted from both BTL sites.   The peritoneum was reapproximated using 2-0 vicryl interrupted stitches. The fascia was then closed using 0 Vicryl in a running fashion. The subcutaneous layer was reapproximated with plain gut and the skin was closed with 4-0 vicryl. The patient tolerated the procedure well. Sponge, lap, instrument and needle counts were correct x 2. She was taken to the recovery room in stable condition.    Alric Seton, MD 11/17/2019 11:16 AM   Attestation of Attending Supervision of Obstetric Fellow During Surgery: Surgery was performed by the Obstetric Fellow under my supervision and collaboration.  I have reviewed the Obstetric Fellow's operative report, and I agree with the documentation.  I was present and scrubbed for the entire procedure.    Candelaria Celeste, DO Attending Physician Faculty Practice, [facility] [place]

## 2019-11-17 NOTE — Progress Notes (Signed)
Pt c/o dizziness and lightheadedness.  Assessment WNL. Pt hasn't eaten yet. Encouraged Pt to order meal and eat. Will continue to monitor.

## 2019-11-17 NOTE — H&P (Signed)
OBSTETRIC ADMISSION HISTORY AND PHYSICAL  Lisa Herrera is a 24 y.o. female G2P1001 with IUP at 17w0dby LMP presenting for elective rLTCS and BTL. She endorses contractions every 5-7 minutes for the past 10 hours. She reports +FMs, No LOF, no VB, no blurry vision, headaches or peripheral edema, and RUQ pain.  She plans on breast and bottle feeding. She request BTL for birth control, papers signed. [facility]   Dating: By LMP --->  Estimated Date of Delivery: 11/24/19  Sono:    10/27/19_0 , CWD, normal anatomy, cephalic presentation, [id number] 49% EFW   Prenatal History/Complications:  Maternal obesity Elevated AFI, no abnormalities on u/s, declined amniocentesis Prior c/s for arrest of descent Alpha thalassemia silent carrier Language barrier   Past Medical History: Past Medical History:  Diagnosis Date   Gallstones    Migraine     Past Surgical History: Past Surgical History:  Procedure Laterality Date   CESAREAN SECTION N/A 01/14/2016   Procedure: CESAREAN SECTION;  Surgeon: UOsborne Oman MD;  [facility]  Service: Obstetrics;  Laterality: N/A;   CHOLECYSTECTOMY      Obstetrical History: OB History    Gravida  2   Para  1   Term  1   Preterm      AB      Living  1     SAB      TAB      Ectopic      Multiple  0   Live Births  1        Obstetric Comments  C/S due to breech baby         Social History Social History   Socioeconomic History   Marital status: Married    Spouse name: Not on file   Number of children: Not on file   Years of education: Not on file   Highest education level: Not on file  Occupational History   Not on file  Tobacco Use   Smoking status: Never Smoker   Smokeless tobacco: Never Used  Vaping Use   Vaping Use: Never used  Substance and Sexual Activity   Alcohol use: No    Alcohol/week: 0.0 standard drinks   Drug use: No   Sexual activity: Yes     Birth control/protection: None  Other Topics Concern   Not on file  Social History Narrative   Not on file   Social Determinants of Health   Financial Resource Strain:    Difficulty of Paying Living Expenses: Not on file  Food Insecurity:    Worried About RCharity fundraiserin the Last Year: Not on file   RYRC Worldwideof Food in the Last Year: Not on file  Transportation Needs:    Lack of Transportation (Medical): Not on file   Lack of Transportation (Non-Medical): Not on file  Physical Activity:    Days of Exercise per Week: Not on file   Minutes of Exercise per Session: Not on file  Stress:    Feeling of Stress : Not on file  Social Connections:    Frequency of Communication with Friends and Family: Not on file   Frequency of Social Gatherings with Friends and Family: Not on file   Attends Religious Services: Not on file   Active Member of Clubs or Organizations: Not on file   Attends CArchivistMeetings: Not on file   Marital Status: Not on file    Family History:  Family History  Problem Relation Age of Onset   Breast cancer Maternal Aunt        50's    Allergies: No Known Allergies  Medications Prior to Admission  Medication Sig Dispense Refill Last Dose   acetaminophen (TYLENOL) 325 MG tablet Take 650 mg by mouth every 6 (six) hours as needed for moderate pain or headache.   Past Month at Unknown time   aspirin EC 81 MG tablet Take 1 tablet (81 mg total) by mouth daily. 30 tablet 5 11/16/2019 at Unknown time   FOLIC ACID PO Take 1 tablet by mouth daily.   11/16/2019 at Unknown time   omeprazole (PRILOSEC) 20 MG capsule Take 20 mg by mouth daily.    11/16/2019 at Unknown time   Prenatal Vit-Fe Fumarate-FA (PRENATAL MULTIVITAMIN) TABS tablet Take 1 tablet by mouth daily.    11/16/2019 at Unknown time   Blood Pressure Monitoring (BLOOD PRESSURE KIT) DEVI 1 kit by Does not apply route once a week. Check Blood Pressure regularly and record  readings into the Babyscripts App.  Large Cuff.  DX O90.0 1 each 0    Elastic Bandages & Supports (COMFORT FIT MATERNITY SUPP LG) MISC 1 each by Does not apply route daily. 1 each 0    folic acid (FOLVITE) 1 MG tablet Take 1 tablet (1 mg total) by mouth daily. (Patient not taking: Reported on 11/06/2019) 30 tablet 10 Not Taking at Unknown time     Review of Systems   All systems reviewed and negative except as stated in HPI  Blood pressure 138/74, pulse 85, temperature 98.6 F (37 C), temperature source Oral, resp. rate 18, height _0  (1.575 m), last menstrual period 02/17/2019, SpO2 100 %, currently breastfeeding. General appearance: alert, cooperative and no distress, morbidly obese Lungs: normal effort Heart: regular rate and rhythm Abdomen: soft, non-tender; bowel sounds normal Pelvic: deferrred Extremities: Homans sign is negative, no sign of DVT Dilation:  (pt so high could not reach cervix) Station: Plus 3 (high unable feelpresenting part) Exam by:: erin davis rn   Prenatal labs: ABO, Rh: --/--/O POS (08/22 4431) Antibody: NEG (08/22 5400) Rubella: 1.01 (02/16 1035) RPR: NON REACTIVE (08/22 0838)  HBsAg: Negative (02/16 1035)  HIV: NON REACTIVE (08/22 8676)  GBS: Positive/-- (08/04 0846)  2 hr Glucola passed Genetic screening  Silent alpha thal carrier, elevated AFP, otherwise nml Anatomy US no abnormalities, limited by body habitus, f/u without abnormalities  Prenatal Transfer Tool  Maternal Diabetes: No Genetic Screening: Abnormal:  Results: Elevated AFP, Other:silent alpha thal carrier Maternal Ultrasounds/Referrals: Normal Fetal Ultrasounds or other Referrals:  Referred to Materal Fetal Medicine , declined amniocentesis Maternal Substance Abuse:  No Significant Maternal Medications:  None Significant Maternal Lab Results: None  Results for orders placed or performed during the hospital encounter of 11/17/19 (from the past 24 hour(s))  Prepare RBC  (crossmatch)   Collection Time: 11/17/19  7:32 AM  Result Value Ref Range   Order Confirmation      ORDER PROCESSED BY BLOOD BANK Performed at [facility], [address], [place], [place] [address]   Prepare RBC (crossmatch)   Collection Time: 11/17/19  7:46 AM  Result Value Ref Range   Order Confirmation      ORDER PROCESSED BY BLOOD BANK Performed at [facility], [place] [address], [place], [place] [address]     Patient Active Problem List   Diagnosis Date Noted   [redacted] weeks gestation of pregnancy 11/04/2019  Axillary pain, left 11/04/2019   Sleep apnea 10/28/2019   BMI 50.0-59.9, adult ([place]) 10/28/2019   Language barrier 09/01/2019   Elevated AFP 06/23/2019   Alpha thalassemia silent carrier 05/29/2019   Gastroesophageal reflux disease with esophagitis without hemorrhage 05/12/2019   Obesity affecting pregnancy in third trimester, antepartum 05/12/2019   Hx of cholelithiasis 05/12/2019   Supervision of other normal pregnancy, antepartum 04/08/2019   S/P cesarean section for arrest of descent 01/13/2016   Maternal varicella, non-immune 01/12/2016    Assessment/Plan:  Lisa Herrera is a 24 y.o. G2P1001 at 16w0dhere for elective rLTCS and BTL.  #Scheduled c-section and BTL. The risks of cesarean section were discussed with the patient including but were not limited to: bleeding which may require transfusion or reoperation; infection which may require antibiotics; injury to bowel, bladder, ureters or other surrounding organs; injury to the fetus; need for additional procedures including hysterectomy in the event of a life-threatening hemorrhage; placental abnormalities wth subsequent pregnancies, incisional problems, thromboembolic phenomenon and other postoperative/anesthesia complications.  Patient also desires permanent sterilization.  Other reversible forms of contraception were discussed with patient; she declines all other modalities. Risks of  procedure discussed with patient including but not limited to: risk of regret, permanence of method, bleeding, infection, injury to surrounding organs and need for additional procedures.  Failure risk of about 1% with increased risk of ectopic gestation if pregnancy occurs was also discussed with patient.  Also discussed possibility of post-tubal pain syndrome. The patient concurred with the proposed plan, giving informed written consent for the procedures.  Patient has been NPO since 0000 she will remain NPO for procedure. Anesthesia and OR aware.  Preoperative prophylactic antibiotics and SCDs ordered on call to the OR.  To OR when ready. #Pain: spinal #FWB: Cat 1 #ID: GBS positive, not indicated given method of delivery #MOF: both #MOC: BTL #Circ: yes  AArrie Senate MD  11/17/2019, 8:58 AM

## 2019-11-17 NOTE — Anesthesia Postprocedure Evaluation (Signed)
Anesthesia Post Note  Patient: Lisa Herrera  Procedure(s) Performed: CESAREAN SECTION (N/A ) BILATERAL TUBAL LIGATION (Bilateral )     Patient location during evaluation: PACU Anesthesia Type: Spinal Level of consciousness: oriented and awake and alert Pain management: pain level controlled Vital Signs Assessment: post-procedure vital signs reviewed and stable Respiratory status: spontaneous breathing and respiratory function stable Cardiovascular status: blood pressure returned to baseline and stable Postop Assessment: no headache, no backache, no apparent nausea or vomiting, patient able to bend at knees and spinal receding Anesthetic complications: no   No complications documented.  Last Vitals:  Vitals:   11/17/19 1226 11/17/19 1245  BP: 128/76 127/72  Pulse: (!) 112 96  Resp:  16  Temp: 36.8 C 36.8 C  SpO2: 100% 100%    Last Pain:  Vitals:   11/17/19 1245  TempSrc: Oral  PainSc: 1    Pain Goal:                Epidural/Spinal Function Cutaneous sensation: Able to Wiggle Toes (11/17/19 1245), Patient able to flex knees: Yes (11/17/19 1245), Patient able to lift hips off bed: No (11/17/19 1245), Back pain beyond tenderness at insertion site: No (11/17/19 1245), Progressively worsening motor and/or sensory loss: No (11/17/19 1245), Bowel and/or bladder incontinence post epidural: No (11/17/19 1245)  Lannie Fields

## 2019-11-17 NOTE — Anesthesia Procedure Notes (Signed)
Spinal  Patient location during procedure: OR Start time: 11/17/2019 9:38 AM End time: 11/17/2019 9:45 AM Staffing Performed: anesthesiologist  Anesthesiologist: Lannie Fields, DO Preanesthetic Checklist Completed: patient identified, IV checked, risks and benefits discussed, surgical consent, monitors and equipment checked, pre-op evaluation and timeout performed Spinal Block Patient position: sitting Prep: DuraPrep and site prepped and draped Patient monitoring: cardiac monitor, continuous pulse ox and blood pressure Approach: midline Location: L3-4 Injection technique: single-shot Needle Needle type: Pencan  Needle gauge: 24 G Needle length: 9 cm Assessment Sensory level: T6 Additional Notes Functioning IV was confirmed and monitors were applied. Sterile prep and drape, including hand hygiene and sterile gloves were used. The patient was positioned and the spine was prepped. The skin was anesthetized with lidocaine.  Free flow of clear CSF was obtained prior to injecting local anesthetic into the CSF.  The spinal needle aspirated freely following injection.  The needle was carefully withdrawn.  The patient tolerated the procedure well.

## 2019-11-17 NOTE — Discharge Instructions (Signed)

## 2019-11-18 LAB — CBC
HCT: 25.1 % — ABNORMAL LOW (ref 36.0–46.0)
Hemoglobin: 7.8 g/dL — ABNORMAL LOW (ref 12.0–15.0)
MCH: 27 pg (ref 26.0–34.0)
MCHC: 31.1 g/dL (ref 30.0–36.0)
MCV: 86.9 fL (ref 80.0–100.0)
Platelets: 186 10*3/uL (ref 150–400)
RBC: 2.89 MIL/uL — ABNORMAL LOW (ref 3.87–5.11)
RDW: 14.3 % (ref 11.5–15.5)
WBC: 10.9 10*3/uL — ABNORMAL HIGH (ref 4.0–10.5)
nRBC: 0 % (ref 0.0–0.2)

## 2019-11-18 MED ORDER — SODIUM CHLORIDE 0.9 % IV SOLN
510.0000 mg | Freq: Once | INTRAVENOUS | Status: AC
  Administered 2019-11-18: 510 mg via INTRAVENOUS
  Filled 2019-11-18: qty 17

## 2019-11-18 NOTE — Progress Notes (Signed)
POSTPARTUM PROGRESS NOTE  Subjective: Lisa Herrera is a 24 y.o. G2B6389 s/p elective rLTCS+BTL at [redacted]w[redacted]d.  She reports she doing well. No acute events overnight. She denies any problems with ambulating, voiding or po intake. Denies nausea or vomiting. She has passed flatus but no BM yet. Pain is well controlled.  Lochia is minimal. Pt does endorse some low back pain at site of epidural but denies headache.  Objective: Blood pressure 105/62, pulse 68, temperature 98.1 F (36.7 C), temperature source Oral, resp. rate 18, height 5\' 2"  (1.575 m), last menstrual period 02/17/2019, SpO2 99 %, currently breastfeeding.  Physical Exam:  General: alert, cooperative and no distress Chest: no respiratory distress Abdomen: soft, non-tender, abdominal dressing clean/dry/intact Uterine Fundus: firm and at level of umbilicus Extremities: No calf swelling or tenderness  trace LE edema bilaterally  Recent Labs    11/15/19 0838  HGB 10.9*  HCT 35.1*    Assessment/Plan: Lisa Herrera is a 24 y.o. 30 s/p elective rLTCS+BTL at [redacted]w[redacted]d.  Routine Postpartum Care: Doing well, pain well-controlled.  -- Continue routine care, lactation support  -- Contraception: s/p BTL -- Feeding: both -- Anemia: Hgb 10.9 >7.8. Plan for IV iron infusion today.  Dispo: Plan for discharge POD#2-3.  [redacted]w[redacted]d, MD OB Fellow, Faculty [facility] 11/18/2019 4:17 AM

## 2019-11-18 NOTE — Lactation Note (Signed)
This note was copied from a baby's chart. Lactation Consultation Note  Patient Name: Lisa Herrera PHXTA'V Date: 11/18/2019    [name] now 44 hours old. Inquired about formula feeding last couple feeds.  They report mom has no milk.   Discussed always offering the breast first and discussed inititiating pumping with DEBP everytime mom gives a bottle.  Mom reports she will think about it.  Mom has manual pump encouraged her to start using that everytime gives bottle instead.  Explained to parents it is frequent breastfeeding and stimulation that helps you have a good supply for later. Urged mom to call for breastfeeding assistance.  Parents report sometimes he latches better than others.       Lexx Monte S Alby Schwabe 11/18/2019, 7:01 PM

## 2019-11-19 LAB — TYPE AND SCREEN
ABO/RH(D): O POS
Antibody Screen: NEGATIVE
Unit division: 0
Unit division: 0

## 2019-11-19 LAB — BPAM RBC
Blood Product Expiration Date: 202109192359
Blood Product Expiration Date: 202109212359
ISSUE DATE / TIME: 202108182208
Unit Type and Rh: 5100
Unit Type and Rh: 5100

## 2019-11-19 MED ORDER — IBUPROFEN 800 MG PO TABS
800.0000 mg | ORAL_TABLET | Freq: Four times a day (QID) | ORAL | 0 refills | Status: DC
Start: 2019-11-19 — End: 2019-12-11

## 2019-11-19 MED ORDER — COCONUT OIL OIL: 1.0000 "application " | TOPICAL_OIL | 0 refills | Status: DC | PRN

## 2019-11-19 MED ORDER — BUTALBITAL-APAP-CAFFEINE 50-325-40 MG PO TABS: 2.0000 | ORAL_TABLET | Freq: Four times a day (QID) | ORAL | Status: DC | PRN

## 2019-11-19 MED FILL — IBUPROFEN 800 MG TAB: 800 | 8 days supply | Qty: 30 | Fill #0

## 2019-11-19 NOTE — Lactation Note (Signed)
This note was copied from a baby's chart. Lactation Consultation Note  Patient Name: Lisa Herrera Date: 11/19/2019 Reason for consult: Follow-up assessment;Primapara;1st time breastfeeding;Term;Infant weight loss;Other (Comment);Difficult latch (5 % weight loss / DL- dad signed the consent to be spanish interpreter) Baby is 52 hours old  Baby has been receiving mostly bottles and when LC entered the room , mom trying to the latch the baby in the cradle position and having a difficult time.  LC recommended to obtain a better angle with the latch to switch to the football and latch for support.  Areola edema noted, and with reverse pressure was able to latch , slightly shallow and due to the baby receiving so many bottles and the milk not being there baby only stayed 5 mins and released.  LC provided shells for between feedings and not sleep with a bra.  And to call [facility] for DEBP today and until a DEBP can be obtained to keep on trying to latch - prepump and use the football position for support.  Every if baby does latch supplement with 30 ml of EBM or formula.  When the DEBP is obtained add post pumping to enhance the milk coming in.  [facility] offered to request and LC O/P appt for next week / mom and dad receptive and placed a request in epic . LC will fax a DEBP [place] [facility].  Mom has the [facility] pamphlet for [place] and [place].     Maternal Data Has patient been taught Hand Expression?: Yes  Feeding Feeding Type: Breast Fed  LATCH Score Latch: Repeated attempts needed to sustain latch, nipple held in mouth throughout feeding, stimulation needed to elicit sucking reflex.  Audible Swallowing: None  Type of Nipple: Flat (swollen areolas preventing baby from obtaining depth)  Comfort (Breast/Nipple): Soft / non-tender  Hold (Positioning): Assistance needed to correctly position infant at breast and maintain latch.  LATCH Score: 5  Interventions Interventions: Breast feeding basics  reviewed;Assisted with latch;Skin to skin;Breast massage;Hand express;Breast compression;Adjust position;Support pillows;Position options  Lactation Tools Discussed/Used Tools: Shells;Pump Shell Type: Inverted Breast pump type: Manual;Double-Electric Breast Pump WIC Program: Yes Pump Review: Milk Storage;Setup, frequency, and cleaning   Consult Status Consult Status: Follow-up Follow-up type: Out-patient    Matilde Sprang Daelin Haste 11/19/2019, 1:46 PM

## 2019-11-19 NOTE — Progress Notes (Addendum)
Post Partum Day 2  Subjective:  Lisa Herrera is a 24 y.o. G2P2002 [redacted]w[redacted]d s/p rLTCS.  No acute events overnight.  Pt denies problems with ambulating, voiding or po intake.  She denies nausea or vomiting.  Pain is well controlled. She has had flatus. She has had bowel movement.  Lochia Minimal.  Plan for birth control is bilateral tubal ligation.  Method of Feeding: breast and bottle  Objective: BP 120/69 (BP Location: Right Arm)   Pulse 84   Temp 98.7 F (37.1 C) (Oral)   Resp 18   Ht 5\' 2"  (1.575 m)   LMP 02/17/2019 (Exact Date)   SpO2 99%   Breastfeeding Yes Comment: and bottle  BMI 51.14 kg/m   Physical Exam:  General: alert, cooperative and no distress Lochia:normal flow Chest: CTAB Heart: RRR no m/r/g Abdomen: +BS, soft, nontender, fundus firm at/below umbilicus Uterine Fundus: firm, below umbilicus DVT Evaluation: No evidence of DVT seen on physical exam. Extremities: mild bilateral edema  Recent Labs    11/18/19 0509  HGB 7.8*  HCT 25.1*    Assessment/Plan:  ASSESSMENT: Lisa Herrera is a 24 y.o. G2P2002 [redacted]w[redacted]d ppd #2 s/p rLTCS doing well.   Routine Postpartum care -MOF: both -MOC: BTL done -anemia: s/p IV iron, will DC with PO iron tabs -headache: with some low back and neck pain in addition to dizziness. Will touch base with anesthesia. -concern about umbilical hernia: discussed recommendation for PCP f/u with possible gen surgery consult   LOS: 2 days   [redacted]w[redacted]d 11/19/2019, 7:52 AM   Attestation of Supervision of Student:  I confirm that I have verified the information documented in the  resident's  note and that I have also personally reperformed the history, physical exam and all medical decision making activities.  I have verified that all services and findings are accurately documented in this student's note; and I agree with management and plan as outlined in the documentation. I have also made any necessary editorial changes.  11/21/2019,  MD [facility], [facility] 11/19/2019 8:43 AM

## 2019-11-19 NOTE — Progress Notes (Signed)
RN received call from MD Homero Fellers asking to give patient caffeine due to patient complaints of headache and inability to prescribe medication due to breastfeeding. RN went into patient's room with coffee, however pt denying headache at this time- only pain complaints are lower back pain of 5/10 near epidural site. Pt also not wanting coffee. RN returned call to MD Homero Fellers, no answer (in a delivery). Will try again. Pt with no other complaints at this time. Will continue to monitor.

## 2019-11-24 ENCOUNTER — Ambulatory Visit (INDEPENDENT_AMBULATORY_CARE_PROVIDER_SITE_OTHER): Payer: Medicaid Other

## 2019-11-24 ENCOUNTER — Other Ambulatory Visit: Payer: Self-pay

## 2019-11-24 VITALS — BP 124/83 | HR 109

## 2019-11-24 DIAGNOSIS — Z4889 Encounter for other specified surgical aftercare: Secondary | ICD-10-CM

## 2019-11-24 NOTE — Progress Notes (Signed)
Patient is in the office for incision check, c-section delivery on 11-17-19. Pt denies any complications with incision, honeycomb removed today, incision appears clean and intact. Advised pt to keep incision clean and dry. Pt also reports that she previously had a fever and painful lump in her breast. Pt states that fever and pain are gone and lump is smaller. Advised pt to keep pumping and breastfeeding and to follow up if symptoms change.

## 2019-11-25 NOTE — Progress Notes (Signed)
Patient was assessed and managed by nursing staff during this encounter. I have reviewed the chart and agree with the documentation and plan. I have also made any necessary editorial changes.  Jermika Olden, MD 11/25/2019 9:36 AM 

## 2019-12-11 ENCOUNTER — Ambulatory Visit (INDEPENDENT_AMBULATORY_CARE_PROVIDER_SITE_OTHER): Payer: Medicaid Other | Admitting: Obstetrics and Gynecology

## 2019-12-11 ENCOUNTER — Other Ambulatory Visit: Payer: Self-pay

## 2019-12-11 ENCOUNTER — Encounter: Payer: Self-pay | Admitting: Obstetrics and Gynecology

## 2019-12-11 NOTE — Patient Instructions (Signed)
Mantenimiento de la salud en las mujeres Health Maintenance, Female Adoptar un estilo de vida saludable y recibir atencin preventiva son importantes para promover la salud y el bienestar. Consulte al mdico sobre:  El esquema adecuado para hacerse pruebas y exmenes peridicos.  Cosas que puede hacer por su cuenta para prevenir enfermedades y mantenerse sana. Qu debo saber sobre la dieta, el peso y el ejercicio? Consuma una dieta saludable   Consuma una dieta que incluya muchas verduras, frutas, productos lcteos con bajo contenido de grasa y protenas magras.  No consuma muchos alimentos ricos en grasas slidas, azcares agregados o sodio. Mantenga un peso saludable El ndice de masa muscular (IMC) se utiliza para identificar problemas de peso. Proporciona una estimacin de la grasa corporal basndose en el peso y la altura. Su mdico puede ayudarle a determinar su IMC y a lograr o mantener un peso saludable. Haga ejercicio con regularidad Haga ejercicio con regularidad. Esta es una de las prcticas ms importantes que puede hacer por su salud. La mayora de los adultos deben seguir estas pautas:  Realizar, al menos, 150minutos de actividad fsica por semana. El ejercicio debe aumentar la frecuencia cardaca y hacerlo transpirar (ejercicio de intensidad moderada).  Hacer ejercicios de fortalecimiento por lo menos dos veces por semana. Agregue esto a su plan de ejercicio de intensidad moderada.  Pasar menos tiempo sentados. Incluso la actividad fsica ligera puede ser beneficiosa. Controle sus niveles de colesterol y lpidos en la sangre Comience a realizarse anlisis de lpidos y colesterol en la sangre a los 20aos y luego reptalos cada 5aos. Hgase controlar los niveles de colesterol con mayor frecuencia si:  Sus niveles de lpidos y colesterol son altos.  Es mayor de 40aos.  Presenta un alto riesgo de padecer enfermedades cardacas. Qu debo saber sobre las pruebas de  deteccin del cncer? Segn su historia clnica y sus antecedentes familiares, es posible que deba realizarse pruebas de deteccin del cncer en diferentes edades. Esto puede incluir pruebas de deteccin de lo siguiente:  Cncer de mama.  Cncer de cuello uterino.  Cncer colorrectal.  Cncer de piel.  Cncer de pulmn. Qu debo saber sobre la enfermedad cardaca, la diabetes y la hipertensin arterial? Presin arterial y enfermedad cardaca  La hipertensin arterial causa enfermedades cardacas y aumenta el riesgo de accidente cerebrovascular. Es ms probable que esto se manifieste en las personas que tienen lecturas de presin arterial alta, tienen ascendencia africana o tienen sobrepeso.  Hgase controlar la presin arterial: ? Cada 3 a 5 aos si tiene entre 18 y 39 aos. ? Todos los aos si es mayor de 40aos. Diabetes Realcese exmenes de deteccin de la diabetes con regularidad. Este anlisis revisa el nivel de azcar en la sangre en ayunas. Hgase las pruebas de deteccin:  Cada tresaos despus de los 40aos de edad si tiene un peso normal y un bajo riesgo de padecer diabetes.  Con ms frecuencia y a partir de una edad inferior si tiene sobrepeso o un alto riesgo de padecer diabetes. Qu debo saber sobre la prevencin de infecciones? Hepatitis B Si tiene un riesgo ms alto de contraer hepatitis B, debe someterse a un examen de deteccin de este virus. Hable con el mdico para averiguar si tiene riesgo de contraer la infeccin por hepatitis B. Hepatitis C Se recomienda el anlisis a:  Todos los que nacieron entre 1945 y 1965.  Todas las personas que tengan un riesgo de haber contrado hepatitis C. Enfermedades de transmisin sexual (ETS)  Hgase las   pruebas de deteccin de ITS, incluidas la gonorrea y la clamidia, si: ? Es sexualmente activa y es menor de 24aos. ? Es mayor de 24aos, y el mdico le informa que corre riesgo de tener este tipo de  infecciones. ? La actividad sexual ha cambiado desde que le hicieron la ltima prueba de deteccin y tiene un riesgo mayor de tener clamidia o gonorrea. Pregntele al mdico si usted tiene riesgo.  Pregntele al mdico si usted tiene un alto riesgo de contraer VIH. El mdico tambin puede recomendarle un medicamento recetado para ayudar a evitar la infeccin por el VIH. Si elige tomar medicamentos para prevenir el VIH, primero debe hacerse los anlisis de deteccin del VIH. Luego debe hacerse anlisis cada 3meses mientras est tomando los medicamentos. Embarazo  Si est por dejar de menstruar (fase premenopusica) y usted puede quedar embarazada, busque asesoramiento antes de quedar embarazada.  Tome de 400 a 800microgramos (mcg) de cido flico todos los das si queda embarazada.  Pida mtodos de control de la natalidad (anticonceptivos) si desea evitar un embarazo no deseado. Osteoporosis y menopausia La osteoporosis es una enfermedad en la que los huesos pierden los minerales y la fuerza por el avance de la edad. El resultado pueden ser fracturas en los huesos. Si tiene 65aos o ms, o si est en riesgo de sufrir osteoporosis y fracturas, pregunte a su mdico si debe:  Hacerse pruebas de deteccin de prdida sea.  Tomar un suplemento de calcio o de vitamina D para reducir el riesgo de fracturas.  Recibir terapia de reemplazo hormonal (TRH) para tratar los sntomas de la menopausia. Siga estas instrucciones en su casa: Estilo de vida  No consuma ningn producto que contenga nicotina o tabaco, como cigarrillos, cigarrillos electrnicos y tabaco de mascar. Si necesita ayuda para dejar de fumar, consulte al mdico.  No consuma drogas.  No comparta agujas.  Solicite ayuda a su mdico si necesita apoyo o informacin para abandonar las drogas. Consumo de alcohol  No beba alcohol si: ? Su mdico le indica no hacerlo. ? Est embarazada, puede estar embarazada o est tratando de quedar  embarazada.  Si bebe alcohol: ? Limite la cantidad que consume de 0 a 1 medida por da. ? Limite la ingesta si est amamantando.  Est atento a la cantidad de alcohol que hay en las bebidas que toma. En [place], una medida equivale a una botella de cerveza de 12oz (355ml), un vaso de vino de 5oz (148ml) o un vaso de una bebida alcohlica de alta graduacin de 1oz (44ml). Instrucciones generales  Realcese los estudios de rutina de la salud, dentales y de la vista.  Mantngase al da con las vacunas.  Infrmele a su mdico si: ? Se siente deprimida con frecuencia. ? Alguna vez ha sido vctima de maltrato o no se siente segura en su casa. Resumen  Adoptar un estilo de vida saludable y recibir atencin preventiva son importantes para promover la salud y el bienestar.  Siga las instrucciones del mdico acerca de una dieta saludable, el ejercicio y la realizacin de pruebas o exmenes para detectar enfermedades.  Siga las instrucciones del mdico con respecto al control del colesterol y la presin arterial. Esta informacin no tiene como fin reemplazar el consejo del mdico. Asegrese de hacerle al mdico cualquier pregunta que tenga. Document Revised: 04/02/2018 Document Reviewed: 04/02/2018 Elsevier Patient Education  2020 Elsevier Inc.  

## 2019-12-11 NOTE — Progress Notes (Signed)
Post Partum Visit Note  Lisa Herrera is a 24 y.o. [id number] female who presents for a postpartum visit. She is 3 weeks postpartum following a repeat cesarean section.  I have fully reviewed the prenatal and intrapartum course. The delivery was at 39.0 gestational weeks.  Anesthesia: epidural. Postpartum course has been unremarkable. Baby is doing well. Baby is feeding by breast. Bleeding no bleeding. Bowel function is normal. Bladder function is normal. Patient is not sexually active. Contraception method is tubal ligation. Postpartum depression screening: negative=8   The pregnancy intention screening data noted above was reviewed. Potential methods of contraception were discussed. The patient elected to proceed with Female Sterilization.    Edinburgh Postnatal Depression Scale - 12/11/19 0847      Edinburgh Postnatal Depression Scale:  In the Past 7 Days   I have been able to laugh and see the funny side of things. 2    I have looked forward with enjoyment to things. 1    I have blamed myself unnecessarily when things went wrong. 0    I have been anxious or worried for no good reason. 0    I have felt scared or panicky for no good reason. 0    Things have been getting on top of me. 3    I have been so unhappy that I have had difficulty sleeping. 2    I have felt sad or miserable. 0    I have been so unhappy that I have been crying. 0    The thought of harming myself has occurred to me. 0    Edinburgh Postnatal Depression Scale Total 8             Review of Systems Pertinent items are noted in HPI.    Objective:  Blood pressure 113/76, pulse 79, weight 256 lb (116.1 kg), last menstrual period 02/17/2019, currently breastfeeding.  General:  alert   Breasts:  inspection negative, no nipple discharge or bleeding, no masses or nodularity palpable  Lungs: clear to auscultation bilaterally  Heart:  regular rate and rhythm, S1, S2 normal, no murmur, click, rub or gallop  Abdomen:  soft, non-tender; bowel sounds normal; no masses,  no organomegaly   Vulva:  not evaluated  Vagina: not evaluated  Cervix:  not evaluated  Corpus: not examined  Adnexa:  normal adnexa  Rectal Exam: Not performed.        Assessment:    NL postpartum exam. Pap smear not done at today's visit.   Plan:   Essential components of care per ACOG recommendations:  1.  Mood and well being: Patient with negative depression screening today. Reviewed local resources for support.  - Patient does not use tobacco.  - hx of drug use? No    2. Infant care and feeding:  -Patient currently breastmilk feeding? Yes If breastmilk feeding discussed return to work and pumping. If needed, patient was provided letter for work to allow for every 2-3 hr pumping breaks, and to be granted a private location to express breastmilk and refrigerated area to store breastmilk. Reviewed importance of draining breast regularly to support lactation. -Social determinants of health (SDOH) reviewed in EPIC. No concerns  3. Sexuality, contraception and birth spacing - Patient does not want a pregnancy in the next year.  Desired family size is completed S/p BTL  4. Sleep and fatigue -Encouraged family/partner/community support of 4 hrs of uninterrupted sleep to help with mood and fatigue  5. Physical Recovery  - Discussed  patients delivery  - - Patient has urinary incontinence? No  - Patient is safe to resume physical and sexual activity  6.  Health Maintenance - Last pap smear done 3/20 and was normal with negative HPV.     [facility] [employer], [facility]

## 2019-12-15 ENCOUNTER — Ambulatory Visit: Payer: Medicaid Other | Admitting: Obstetrics and Gynecology

## 2019-12-24 ENCOUNTER — Other Ambulatory Visit: Payer: Self-pay | Admitting: Lactation Services

## 2019-12-24 ENCOUNTER — Other Ambulatory Visit: Payer: Medicaid Other

## 2019-12-24 DIAGNOSIS — D509 Iron deficiency anemia, unspecified: Secondary | ICD-10-CM

## 2019-12-24 NOTE — Progress Notes (Addendum)
Patient in for Lactation appointment. She has concers about anemia. She reports being very tired all the time, she reports is more pronounced during breast feeding and pumping. Reviewed Oxytocin and its relaxing effects when stimulating breasts. Last Hgb noted to be 7.8, she did have a Feraheme infusion at the time of the last CBC on 8/25. Spoke with Dr. Alysia Penna who ordered a CBC on patient. Drawn at [facility] as patient in office currently. Patient is taking PNV daily.

## 2019-12-25 LAB — CBC
Hematocrit: 40.4 % (ref 34.0–46.6)
Hemoglobin: 13 g/dL (ref 11.1–15.9)
MCH: 28 pg (ref 26.6–33.0)
MCHC: 32.2 g/dL (ref 31.5–35.7)
MCV: 87 fL (ref 79–97)
Platelets: 230 10*3/uL (ref 150–450)
RBC: 4.65 x10E6/uL (ref 3.77–5.28)
RDW: 14.9 % (ref 11.7–15.4)
WBC: 7 10*3/uL (ref 3.4–10.8)

## 2020-01-01 ENCOUNTER — Emergency Department (HOSPITAL_BASED_OUTPATIENT_CLINIC_OR_DEPARTMENT_OTHER)
Admission: EM | Admit: 2020-01-01 | Discharge: 2020-01-02 | Disposition: A | Discharge status: Home / Self Care | Payer: Medicaid Other | Attending: Emergency Medicine | Admitting: Emergency Medicine

## 2020-01-01 ENCOUNTER — Encounter (HOSPITAL_BASED_OUTPATIENT_CLINIC_OR_DEPARTMENT_OTHER): Payer: Self-pay | Admitting: *Deleted

## 2020-01-01 ENCOUNTER — Other Ambulatory Visit: Payer: Self-pay

## 2020-01-01 DIAGNOSIS — R112 Nausea with vomiting, unspecified: Secondary | ICD-10-CM | POA: Insufficient documentation

## 2020-01-01 DIAGNOSIS — R1012 Left upper quadrant pain: Secondary | ICD-10-CM | POA: Diagnosis present

## 2020-01-01 DIAGNOSIS — R197 Diarrhea, unspecified: Secondary | ICD-10-CM | POA: Diagnosis not present

## 2020-01-01 DIAGNOSIS — R1013 Epigastric pain: Secondary | ICD-10-CM | POA: Diagnosis not present

## 2020-01-01 LAB — CBC
HCT: 40.3 % (ref 36.0–46.0)
Hemoglobin: 12.5 g/dL (ref 12.0–15.0)
MCH: 27.4 pg (ref 26.0–34.0)
MCHC: 31 g/dL (ref 30.0–36.0)
MCV: 88.2 fL (ref 80.0–100.0)
Platelets: 257 10*3/uL (ref 150–400)
RBC: 4.57 MIL/uL (ref 3.87–5.11)
RDW: 14.6 % (ref 11.5–15.5)
WBC: 7.4 10*3/uL (ref 4.0–10.5)
nRBC: 0 % (ref 0.0–0.2)

## 2020-01-01 LAB — COMPREHENSIVE METABOLIC PANEL
ALT: 81 U/L — ABNORMAL HIGH (ref 0–44)
AST: 49 U/L — ABNORMAL HIGH (ref 15–41)
Albumin: 3.8 g/dL (ref 3.5–5.0)
Alkaline Phosphatase: 102 U/L (ref 38–126)
Anion gap: 11 (ref 5–15)
BUN: 12 mg/dL (ref 6–20)
CO2: 28 mmol/L (ref 22–32)
Calcium: 9.1 mg/dL (ref 8.9–10.3)
Chloride: 100 mmol/L (ref 98–111)
Creatinine, Ser: 0.56 mg/dL (ref 0.44–1.00)
GFR, Estimated: >60 mL/min (ref >60)
Glucose, Bld: 91 mg/dL (ref 70–99)
Potassium: 4.1 mmol/L (ref 3.5–5.1)
Sodium: 139 mmol/L (ref 135–145)
Total Bilirubin: 0.2 mg/dL — ABNORMAL LOW (ref 0.3–1.2)
Total Protein: 6.8 g/dL (ref 6.5–8.1)

## 2020-01-01 LAB — URINALYSIS, ROUTINE W REFLEX MICROSCOPIC
Bilirubin Urine: NEGATIVE
Glucose, UA: NEGATIVE mg/dL
Hgb urine dipstick: NEGATIVE
Ketones, ur: NEGATIVE mg/dL
Leukocytes,Ua: NEGATIVE
Nitrite: NEGATIVE
Protein, ur: NEGATIVE mg/dL
Specific Gravity, Urine: 1.02 (ref 1.005–1.030)
pH: 7.5 (ref 5.0–8.0)

## 2020-01-01 LAB — PREGNANCY, URINE: Preg Test, Ur: NEGATIVE

## 2020-01-01 LAB — LIPASE, BLOOD: Lipase: 29 U/L (ref 11–51)

## 2020-01-01 MED ORDER — SODIUM CHLORIDE 0.9 % IV BOLUS
500.0000 mL | Freq: Once | INTRAVENOUS | Status: AC
  Administered 2020-01-01: 500 mL via INTRAVENOUS

## 2020-01-01 MED ORDER — ONDANSETRON HCL 4 MG/2ML IJ SOLN
4.0000 mg | Freq: Once | INTRAMUSCULAR | Status: AC
  Administered 2020-01-01: 4 mg via INTRAVENOUS
  Filled 2020-01-01: qty 2

## 2020-01-01 MED ORDER — ALUM & MAG HYDROXIDE-SIMETH 200-200-20 MG/5ML PO SUSP
15.0000 mL | Freq: Once | ORAL | Status: AC
  Administered 2020-01-01: 15 mL via ORAL
  Filled 2020-01-01: qty 30

## 2020-01-01 NOTE — ED Triage Notes (Signed)
C/o left upper abd pain n/v/d  x 7 days

## 2020-01-02 ENCOUNTER — Emergency Department (HOSPITAL_BASED_OUTPATIENT_CLINIC_OR_DEPARTMENT_OTHER): Payer: Medicaid Other

## 2020-01-02 MED ORDER — ONDANSETRON HCL 4 MG/2ML IJ SOLN
INTRAMUSCULAR | Status: AC
  Administered 2020-01-02: 4 mg via INTRAVENOUS
  Filled 2020-01-02: qty 2

## 2020-01-02 MED ORDER — ONDANSETRON HCL 4 MG/2ML IJ SOLN: 4.0000 mg | Freq: Once | INTRAMUSCULAR | Status: AC

## 2020-01-02 MED ORDER — DICYCLOMINE HCL 20 MG PO TABS: 20.0000 mg | ORAL_TABLET | Freq: Three times a day (TID) | ORAL | 0 refills | Status: DC | PRN

## 2020-01-02 MED ORDER — IOHEXOL 300 MG/ML  SOLN
100.0000 mL | Freq: Once | INTRAMUSCULAR | Status: AC | PRN
  Administered 2020-01-02: 100 mL via INTRAVENOUS

## 2020-01-02 MED ORDER — ALUM & MAG HYDROXIDE-SIMETH 400-400-40 MG/5ML PO SUSP: 10.0000 mL | Freq: Four times a day (QID) | ORAL | 0 refills | Status: DC | PRN

## 2020-01-02 MED ORDER — PANTOPRAZOLE SODIUM 20 MG PO TBEC: 20.0000 mg | DELAYED_RELEASE_TABLET | Freq: Every day | ORAL | 0 refills | Status: DC

## 2020-01-02 NOTE — Discharge Instructions (Signed)

## 2020-01-02 NOTE — ED Provider Notes (Signed)
Emergency Department Provider Note   I have reviewed the triage vital signs and the nursing notes.   HISTORY  Chief Complaint Abdominal Pain   HPI Lisa Herrera is a 24 y.o. female with past medical history reviewed below presents to the emergency department with left upper quadrant and epigastric abdominal pain over the past week. She has had associated nausea, vomiting, diarrhea as well. She denies any pain into the chest or shortness of breath. No upper respiratory infection symptoms. She denies any fevers. No similar symptoms in the past. No sick contacts. Patient with family at bedside assisting with Spanish interpretation.    Past Medical History:  Diagnosis Date  . Alpha thalassemia silent carrier 05/29/2019  . Gallstones   . Migraine     Patient Active Problem List   Diagnosis Date Noted  . Postpartum care following cesarean delivery 12/11/2019  . Cesarean delivery delivered 11/17/2019  . History of bilateral tubal ligation 11/17/2019  . Axillary pain, left 11/04/2019  . Sleep apnea 10/28/2019  . BMI 50.0-59.9, adult (HCC) 10/28/2019  . Language barrier 09/01/2019  . Gastroesophageal reflux disease with esophagitis without hemorrhage 05/12/2019  . Obesity affecting pregnancy in third trimester, antepartum 05/12/2019  . Hx of cholelithiasis 05/12/2019  . Supervision of other normal pregnancy, antepartum 04/08/2019  . S/P cesarean section for arrest of descent 01/13/2016  . Maternal varicella, non-immune 01/12/2016    Past Surgical History:  Procedure Laterality Date  . CESAREAN SECTION N/A 01/14/2016   Procedure: CESAREAN SECTION;  Surgeon: Tereso Newcomer, MD;  Location: [facility];  Service: Obstetrics;  Laterality: N/A;  . CESAREAN SECTION N/A 11/17/2019   Procedure: CESAREAN SECTION;  Surgeon: Levie Heritage, DO;  Location: [facility] OR[facility];  Service: Obstetrics;  Laterality: N/A;  . CHOLECYSTECTOMY    . TUBAL LIGATION Bilateral 11/17/2019   Procedure:  BILATERAL TUBAL LIGATION;  Surgeon: Levie Heritage, DO;  Location: [facility] LD ORS;  Service: Obstetrics;  Laterality: Bilateral;    Allergies Patient has no known allergies.  Family History  Problem Relation Age of Onset  . Breast cancer Maternal Aunt        30's    Social History Social History   Tobacco Use  . Smoking status: Never Smoker  . Smokeless tobacco: Never Used  Vaping Use  . Vaping Use: Never used  Substance Use Topics  . Alcohol use: No    Alcohol/week: 0.0 standard drinks  . Drug use: No    Review of Systems  Constitutional: No fever/chills Eyes: No visual changes. ENT: No sore throat. Cardiovascular: Denies chest pain. Respiratory: Denies shortness of breath. Gastrointestinal: Positive epigastric and LUQ abdominal pain. Positive nausea, vomiting, and diarrhea.  No constipation. Genitourinary: Negative for dysuria. Musculoskeletal: Negative for back pain. Skin: Negative for rash. Neurological: Negative for headaches, focal weakness or numbness.  10-point ROS otherwise negative.  ____________________________________________   PHYSICAL EXAM:  VITAL SIGNS: ED Triage Vitals  Enc Vitals Group     BP 01/01/20 2148 127/61     Pulse Rate 01/01/20 2148 60     Resp 01/01/20 2148 18     Temp 01/01/20 2148 98.1 F (36.7 C)     Temp Source 01/01/20 2148 Oral     SpO2 01/01/20 2148 100 %     Weight 01/01/20 2146 250 lb (113.4 kg)     Height 01/01/20 2146 5\' 3"  (1.6 m)    Constitutional: Alert and oriented. Well appearing and in no acute distress. Eyes: Conjunctivae  are normal.  Head: Atraumatic. Nose: No congestion/rhinnorhea. Mouth/Throat: Mucous membranes are moist. Neck: No stridor.  Cardiovascular: Normal rate, regular rhythm. Good peripheral circulation. Grossly normal heart sounds.   Respiratory: Normal respiratory effort.  No retractions. Lungs CTAB. Gastrointestinal: Soft with mild epigastric and LUQ tenderness. No RUQ tenderness. Negative  Murphy's sign. No distention.  Musculoskeletal: No gross deformities of extremities. Neurologic:  Normal speech and language.  Skin:  Skin is warm, dry and intact. No rash noted.  ____________________________________________   LABS (all labs ordered are listed, but only abnormal results are displayed)  Labs Reviewed  URINALYSIS, ROUTINE W REFLEX MICROSCOPIC - Abnormal; Notable for the following components:      Result Value   APPearance HAZY (*)    All other components within normal limits  COMPREHENSIVE METABOLIC PANEL - Abnormal; Notable for the following components:   AST 49 (*)    ALT 81 (*)    Total Bilirubin 0.2 (*)    All other components within normal limits  PREGNANCY, URINE  LIPASE, BLOOD  CBC   ____________________________________________  RADIOLOGY  CT ABDOMEN PELVIS W CONTRAST  Result Date: 01/02/2020 CLINICAL DATA:  Acute abdominal pain EXAM: CT ABDOMEN AND PELVIS WITH CONTRAST TECHNIQUE: Multidetector CT imaging of the abdomen and pelvis was performed using the standard protocol following bolus administration of intravenous contrast. CONTRAST:  OMNIPAQUE IOHEXOL 300 MG/ML  SOLN COMPARISON:  October 17, 2017 FINDINGS: Lower chest: The lung bases are clear. The heart size is normal. Hepatobiliary: There is decreased hepatic attenuation suggestive of hepatic steatosis. Status post cholecystectomy.There is no biliary ductal dilation. Pancreas: Normal contours without ductal dilatation. No peripancreatic fluid collection. Spleen: Unremarkable. Adrenals/Urinary Tract: --Adrenal glands: Unremarkable. --Right kidney/ureter: No hydronephrosis or radiopaque kidney stones. --Left kidney/ureter: No hydronephrosis or radiopaque kidney stones. --Urinary bladder: Unremarkable. Stomach/Bowel: --Stomach/Duodenum: No hiatal hernia or other gastric abnormality. Normal duodenal course and caliber. --Small bowel: Unremarkable. --Colon: There is scattered colonic diverticula without CT  evidence for diverticulitis. --Appendix: Normal. Vascular/Lymphatic: Normal course and caliber of the major abdominal vessels. --No retroperitoneal lymphadenopathy. --No mesenteric lymphadenopathy. --No pelvic or inguinal lymphadenopathy. Reproductive: The patient is status post prior tubal ligation. Other: No ascites or free air. There is a large fat containing umbilical hernia. Musculoskeletal. No acute displaced fractures. IMPRESSION: 1. No acute abdominopelvic abnormality. 2. Hepatic steatosis. 3. Large fat containing umbilical hernia. 4. Scattered colonic diverticula without CT evidence for diverticulitis. Electronically Signed   By: Katherine Mantle M.D.   On: 01/02/2020 00:43    ____________________________________________   PROCEDURES  Procedure(s) performed:   Procedures  None  ____________________________________________   INITIAL IMPRESSION / ASSESSMENT AND PLAN / ED COURSE  Pertinent labs & imaging results that were available during my care of the patient were reviewed by me and considered in my medical decision making (see chart for details).   Patient presents emergency department epigastric and left upper quadrant abdominal pain. Her lab work is largely unremarkable. No respiratory symptoms to suspect underlying or developing Covid infection. CT imaging obtained showing a fat-containing umbilical hernia but no other acute abnormality. Lung bases are unremarkable on CT imaging. Patient feeling improved with Maalox and Zofran here. Plan for continued supportive care along with close PCP follow-up. Discussed ED return precautions.    ____________________________________________  FINAL CLINICAL IMPRESSION(S) / ED DIAGNOSES  Final diagnoses:  Epigastric pain     MEDICATIONS GIVEN DURING THIS VISIT:  Medications  sodium chloride 0.9 % bolus 500 mL ( Intravenous Stopped 01/02/20 0121)  ondansetron [facility]) injection 4 mg (4 mg Intravenous Given 01/01/20 2349)  alum & mag  hydroxide-simeth (MAALOX/MYLANTA) 200-200-20 MG/5ML suspension 15 mL (15 mLs Oral Given 01/01/20 2349)  iohexol (OMNIPAQUE) 300 MG/ML solution 100 mL (100 mLs Intravenous Contrast Given 01/02/20 0013)  ondansetron (ZOFRAN) injection 4 mg (4 mg Intravenous Given 01/02/20 0130)     NEW OUTPATIENT MEDICATIONS STARTED DURING THIS VISIT:  Discharge Medication List as of 01/02/2020 12:59 AM    START taking these medications   Details  alum & mag hydroxide-simeth (MAALOX MAX) 400-400-40 MG/5ML suspension Take 10 mLs by mouth every 6 (six) hours as needed for indigestion., Starting Sat 01/02/2020, Normal    dicyclomine (BENTYL) 20 MG tablet Take 1 tablet (20 mg total) by mouth 3 (three) times daily as needed for spasms., Starting Sat 01/02/2020, Normal    pantoprazole (PROTONIX) 20 MG tablet Take 1 tablet (20 mg total) by mouth daily., Starting Sat 01/02/2020, Until Mon 02/01/2020, Normal        Note:  This document was prepared using Dragon voice recognition software and may include unintentional dictation errors.  Alona Bene, MD, [facility]    Brittinie Wherley, Arlyss Repress, MD 01/02/20 [phone]

## 2020-01-23 ENCOUNTER — Encounter (HOSPITAL_BASED_OUTPATIENT_CLINIC_OR_DEPARTMENT_OTHER): Payer: Self-pay | Admitting: *Deleted

## 2020-01-23 ENCOUNTER — Other Ambulatory Visit: Payer: Self-pay

## 2020-01-23 ENCOUNTER — Emergency Department (HOSPITAL_BASED_OUTPATIENT_CLINIC_OR_DEPARTMENT_OTHER)
Admission: EM | Admit: 2020-01-23 | Discharge: 2020-01-23 | Disposition: A | Discharge status: Home / Self Care | Payer: Medicaid Other | Attending: Emergency Medicine | Admitting: Emergency Medicine

## 2020-01-23 DIAGNOSIS — R110 Nausea: Secondary | ICD-10-CM | POA: Diagnosis not present

## 2020-01-23 DIAGNOSIS — Z8719 Personal history of other diseases of the digestive system: Secondary | ICD-10-CM | POA: Insufficient documentation

## 2020-01-23 DIAGNOSIS — K2100 Gastro-esophageal reflux disease with esophagitis, without bleeding: Secondary | ICD-10-CM | POA: Insufficient documentation

## 2020-01-23 DIAGNOSIS — R1013 Epigastric pain: Secondary | ICD-10-CM | POA: Diagnosis present

## 2020-01-23 DIAGNOSIS — Z9851 Tubal ligation status: Secondary | ICD-10-CM | POA: Diagnosis not present

## 2020-01-23 LAB — CBC WITH DIFFERENTIAL/PLATELET
Abs Immature Granulocytes: 0.02 10*3/uL (ref 0.00–0.07)
Basophils Absolute: 0 10*3/uL (ref 0.0–0.1)
Basophils Relative: 0 %
Eosinophils Absolute: 0.1 10*3/uL (ref 0.0–0.5)
Eosinophils Relative: 1 %
HCT: 42.1 % (ref 36.0–46.0)
Hemoglobin: 13.5 g/dL (ref 12.0–15.0)
Immature Granulocytes: 0 %
Lymphocytes Relative: 27 %
Lymphs Abs: 2.1 10*3/uL (ref 0.7–4.0)
MCH: 27.5 pg (ref 26.0–34.0)
MCHC: 32.1 g/dL (ref 30.0–36.0)
MCV: 85.7 fL (ref 80.0–100.0)
Monocytes Absolute: 0.4 10*3/uL (ref 0.1–1.0)
Monocytes Relative: 6 %
Neutro Abs: 4.9 10*3/uL (ref 1.7–7.7)
Neutrophils Relative %: 66 %
Platelets: 265 10*3/uL (ref 150–400)
RBC: 4.91 MIL/uL (ref 3.87–5.11)
RDW: 13.6 % (ref 11.5–15.5)
WBC: 7.6 10*3/uL (ref 4.0–10.5)
nRBC: 0 % (ref 0.0–0.2)

## 2020-01-23 LAB — COMPREHENSIVE METABOLIC PANEL
ALT: 99 U/L — ABNORMAL HIGH (ref 0–44)
AST: 61 U/L — ABNORMAL HIGH (ref 15–41)
Albumin: 3.9 g/dL (ref 3.5–5.0)
Alkaline Phosphatase: 114 U/L (ref 38–126)
Anion gap: 10 (ref 5–15)
BUN: 11 mg/dL (ref 6–20)
CO2: 21 mmol/L — ABNORMAL LOW (ref 22–32)
Calcium: 9.1 mg/dL (ref 8.9–10.3)
Chloride: 104 mmol/L (ref 98–111)
Creatinine, Ser: 0.58 mg/dL (ref 0.44–1.00)
GFR, Estimated: >60 mL/min (ref >60)
Glucose, Bld: 98 mg/dL (ref 70–99)
Potassium: 4 mmol/L (ref 3.5–5.1)
Sodium: 135 mmol/L (ref 135–145)
Total Bilirubin: 0.4 mg/dL (ref 0.3–1.2)
Total Protein: 7.1 g/dL (ref 6.5–8.1)

## 2020-01-23 LAB — URINALYSIS, ROUTINE W REFLEX MICROSCOPIC
Bilirubin Urine: NEGATIVE
Glucose, UA: NEGATIVE mg/dL
Hgb urine dipstick: NEGATIVE
Ketones, ur: NEGATIVE mg/dL
Leukocytes,Ua: NEGATIVE
Nitrite: NEGATIVE
Protein, ur: NEGATIVE mg/dL
Specific Gravity, Urine: 1.01 (ref 1.005–1.030)
pH: 6 (ref 5.0–8.0)

## 2020-01-23 LAB — PREGNANCY, URINE: Preg Test, Ur: NEGATIVE

## 2020-01-23 LAB — LIPASE, BLOOD: Lipase: 24 U/L (ref 11–51)

## 2020-01-23 MED ORDER — ALUM & MAG HYDROXIDE-SIMETH 200-200-20 MG/5ML PO SUSP
30.0000 mL | Freq: Once | ORAL | Status: AC
  Administered 2020-01-23: 30 mL via ORAL
  Filled 2020-01-23: qty 30

## 2020-01-23 MED ORDER — ONDANSETRON HCL 4 MG/2ML IJ SOLN
4.0000 mg | Freq: Once | INTRAMUSCULAR | Status: AC
  Administered 2020-01-23: 4 mg via INTRAVENOUS
  Filled 2020-01-23: qty 2

## 2020-01-23 MED ORDER — SODIUM CHLORIDE 0.9 % IV BOLUS
1000.0000 mL | Freq: Once | INTRAVENOUS | Status: AC
  Administered 2020-01-23: 1000 mL via INTRAVENOUS

## 2020-01-23 MED ORDER — FENTANYL CITRATE (PF) 100 MCG/2ML IJ SOLN
50.0000 ug | Freq: Once | INTRAMUSCULAR | Status: AC
  Administered 2020-01-23: 50 ug via INTRAVENOUS
  Filled 2020-01-23: qty 2

## 2020-01-23 MED ORDER — LIDOCAINE VISCOUS HCL 2 % MT SOLN
15.0000 mL | Freq: Once | OROMUCOSAL | Status: AC
  Administered 2020-01-23: 15 mL via ORAL
  Filled 2020-01-23: qty 15

## 2020-01-23 MED ORDER — SUCRALFATE 1 G PO TABS: 1.0000 g | ORAL_TABLET | Freq: Three times a day (TID) | ORAL | 0 refills | Status: DC

## 2020-01-23 MED ORDER — ONDANSETRON HCL 4 MG PO TABS: 4.0000 mg | ORAL_TABLET | Freq: Four times a day (QID) | ORAL | 0 refills | Status: AC

## 2020-01-23 NOTE — ED Triage Notes (Signed)
Pt reports upper abd pain since yesterday. States she has vomited once. Had similar sx earlier this month and was evaluated here and by her PCP

## 2020-01-23 NOTE — ED Provider Notes (Signed)
[facility] EMERGENCY DEPARTMENT Provider Note   CSN: [id number] Arrival date & time: 01/23/20  1844     History Chief Complaint  Patient presents with  . Abdominal Pain    Lisa Herrera is a 24 y.o. female.  The history is provided by the patient.  Abdominal Pain Pain location:  Epigastric Pain quality: aching   Pain radiates to:  Does not radiate Pain severity:  Mild Onset quality:  Gradual Timing:  Intermittent Progression:  Waxing and waning Chronicity:  New Relieved by:  Nothing Worsened by:  Nothing Ineffective treatments: gerd meds. Associated symptoms: nausea   Associated symptoms: no chest pain, no chills, no cough, no dysuria, no fever, no hematuria, no shortness of breath, no sore throat and no vomiting   Risk factors: multiple surgeries        Past Medical History:  Diagnosis Date  . Alpha thalassemia silent carrier 05/29/2019  . Gallstones   . Migraine     Patient Active Problem List   Diagnosis Date Noted  . Postpartum care following cesarean delivery 12/11/2019  . Cesarean delivery delivered 11/17/2019  . History of bilateral tubal ligation 11/17/2019  . Axillary pain, left 11/04/2019  . Sleep apnea 10/28/2019  . BMI 50.0-59.9, adult (HCC) 10/28/2019  . Language barrier 09/01/2019  . Gastroesophageal reflux disease with esophagitis without hemorrhage 05/12/2019  . Obesity affecting pregnancy in third trimester, antepartum 05/12/2019  . Hx of cholelithiasis 05/12/2019  . Supervision of other normal pregnancy, antepartum 04/08/2019  . S/P cesarean section for arrest of descent 01/13/2016  . Maternal varicella, non-immune 01/12/2016    Past Surgical History:  Procedure Laterality Date  . CESAREAN SECTION N/A 01/14/2016   Procedure: CESAREAN SECTION;  Surgeon: Tereso NewcomerUgonna A Anyanwu, MD;  Location: [facility];  Service: Obstetrics;  Laterality: N/A;  . CESAREAN SECTION N/A 11/17/2019   Procedure: CESAREAN SECTION;  Surgeon: Levie HeritageStinson,  Jacob J, DO;  Location: [facility];  Service: Obstetrics;  Laterality: N/A;  . CHOLECYSTECTOMY    . TUBAL LIGATION Bilateral 11/17/2019   Procedure: BILATERAL TUBAL LIGATION;  Surgeon: Levie HeritageStinson, Jacob J, DO;  Location: [facility];  Service: Obstetrics;  Laterality: Bilateral;     OB History    Gravida  2   Para  2   Term  2   Preterm      AB      Living  2     SAB      TAB      Ectopic      Multiple  0   Live Births  2        Obstetric Comments  C/S due to breech baby         Family History  Problem Relation Age of Onset  . Breast cancer Maternal Aunt        6650's    Social History   Tobacco Use  . Smoking status: Never Smoker  . Smokeless tobacco: Never Used  Vaping Use  . Vaping Use: Never used  Substance Use Topics  . Alcohol use: No    Alcohol/week: 0.0 standard drinks  . Drug use: No    Home Medications Prior to Admission medications   Medication Sig Start Date End Date Taking? Authorizing Provider  acetaminophen (TYLENOL) 325 MG tablet Take 650 mg by mouth every 6 (six) hours as needed for moderate pain or headache.    [provider]  alum & mag hydroxide-simeth (MAALOX MAX) 400-400-40 MG/5ML suspension Take 10  mLs by mouth every 6 (six) hours as needed for indigestion. 01/02/20   Long, Arlyss Repress, MD  dicyclomine (BENTYL) 20 MG tablet Take 1 tablet (20 mg total) by mouth 3 (three) times daily as needed for spasms. 01/02/20   Long, Arlyss Repress, MD  folic acid (FOLVITE) 1 MG tablet Take 1 mg by mouth daily. 11/25/19   [provider]  ondansetron (ZOFRAN) 4 MG tablet Take 1 tablet (4 mg total) by mouth every 6 (six) hours for 20 doses. 01/23/20 01/28/20  Mica Ramdass, DO  pantoprazole (PROTONIX) 20 MG tablet Take 1 tablet (20 mg total) by mouth daily. 01/02/20 02/01/20  Long, Arlyss Repress, MD  Prenatal Vit-Fe Fumarate-FA (PRENATAL MULTIVITAMIN) TABS tablet Take 1 tablet by mouth daily.     [provider]  sucralfate (CARAFATE) 1 g  tablet Take 1 tablet (1 g total) by mouth 4 (four) times daily -  with meals and at bedtime for 14 days. 01/23/20 02/06/20  Virgina Norfolk, DO    Allergies    Patient has no known allergies.  Review of Systems   Review of Systems  Constitutional: Negative for chills and fever.  HENT: Negative for ear pain and sore throat.   Eyes: Negative for pain and visual disturbance.  Respiratory: Negative for cough and shortness of breath.   Cardiovascular: Negative for chest pain and palpitations.  Gastrointestinal: Positive for abdominal pain and nausea. Negative for vomiting.  Genitourinary: Negative for dysuria and hematuria.  Musculoskeletal: Negative for arthralgias and back pain.  Skin: Negative for color change and rash.  Neurological: Negative for seizures and syncope.  All other systems reviewed and are negative.   Physical Exam Updated Vital Signs  ED Triage Vitals  Enc Vitals Group     BP 01/23/20 1852 (!) 135/96     Pulse Rate 01/23/20 1852 86     Resp 01/23/20 1852 18     Temp 01/23/20 1852 98.1 F (36.7 C)     Temp Source 01/23/20 1852 Oral     SpO2 01/23/20 1852 100 %     Weight 01/23/20 1854 255 lb (115.7 kg)     Height 01/23/20 1854 5\' 3"  (1.6 m)     Head Circumference --      Peak Flow --      Pain Score 01/23/20 1854 4     Pain Loc --      Pain Edu? --      Excl. in GC? --     Physical Exam Vitals and nursing note reviewed.  Constitutional:      General: She is not in acute distress.    Appearance: She is well-developed. She is not ill-appearing.  HENT:     Head: Normocephalic and atraumatic.     Mouth/Throat:     Mouth: Mucous membranes are moist.  Eyes:     Extraocular Movements: Extraocular movements intact.     Conjunctiva/sclera: Conjunctivae normal.  Cardiovascular:     Rate and Rhythm: Normal rate and regular rhythm.     Heart sounds: Normal heart sounds. No murmur heard.   Pulmonary:     Effort: Pulmonary effort is normal. No respiratory  distress.     Breath sounds: Normal breath sounds.  Abdominal:     General: Abdomen is flat. There is no distension.     Palpations: Abdomen is soft.     Tenderness: There is abdominal tenderness in the epigastric area.     Hernia: A hernia is present. Hernia is  present in the umbilical area (reducible).  Musculoskeletal:     Cervical back: Neck supple.  Skin:    General: Skin is warm and dry.     Capillary Refill: Capillary refill takes less than 2 seconds.  Neurological:     Mental Status: She is alert.     ED Results / Procedures / Treatments   Labs (all labs ordered are listed, but only abnormal results are displayed) Labs Reviewed  COMPREHENSIVE METABOLIC PANEL - Abnormal; Notable for the following components:      Result Value   CO2 21 (*)    AST 61 (*)    ALT 99 (*)    All other components within normal limits  CBC WITH DIFFERENTIAL/PLATELET  LIPASE, BLOOD  PREGNANCY, URINE  URINALYSIS, ROUTINE W REFLEX MICROSCOPIC    EKG None  Radiology No results found.  Procedures Procedures (including critical care time)  Medications Ordered in ED Medications  sodium chloride 0.9 % bolus 1,000 mL (0 mLs Intravenous Stopped 01/23/20 2055)  ondansetron (ZOFRAN) injection 4 mg (4 mg Intravenous Given 01/23/20 1942)  alum & mag hydroxide-simeth (MAALOX/MYLANTA) 200-200-20 MG/5ML suspension 30 mL (30 mLs Oral Given 01/23/20 1949)    And  lidocaine (XYLOCAINE) 2 % viscous mouth solution 15 mL (15 mLs Oral Given 01/23/20 1949)  fentaNYL (SUBLIMAZE) injection 50 mcg (50 mcg Intravenous Given 01/23/20 1943)    ED Course  I have reviewed the triage vital signs and the nursing notes.  Pertinent labs & imaging results that were available during my care of the patient were reviewed by me and considered in my medical decision making (see chart for details).    MDM Rules/Calculators/A&P                          Marcille Barman is a 24 year old female with history of reflux who  presents the ED with epigastric abdominal pain.  Normal vitals.  No fever.  Similar type pain on and off for the last several days.  Trying to make adjustments to her food but still having some discomfort.  Denies any black stools or bloody stools.  Has reducible umbilical hernia on exam.  CT scan several weeks ago showed fat-containing hernia but no other significant findings on CT scan abdomen pelvis.  She has had her gallbladder removed in the past.  She is not particularly tender in the right upper quadrant.  Most of her discomfort appears to be in her stomach area.  Lab work showed no significant anemia, electrolyte abnormality, kidney injury.  Lipase is normal doubt pancreatitis.  Gallbladder and liver enzymes within normal limits.  Overall work-up unremarkable.  Pregnancy test was negative.  Suspect ongoing reflux type symptoms.  Felt better after GI cocktail fluid bolus and Zofran.  Will prescribe Carafate and Zofran.  Recommend follow-up with primary care doctor.  We will have her follow-up with GI as well.   This chart was dictated using voice recognition software.  Despite best efforts to proofread,  errors can occur which can change the documentation meaning.    Final Clinical Impression(s) / ED Diagnoses Final diagnoses:  Epigastric pain    Rx / DC Orders ED Discharge Orders         Ordered    sucralfate (CARAFATE) 1 g tablet  3 times daily with meals & bedtime        01/23/20 2058    ondansetron (ZOFRAN) 4 MG tablet  Every 6 hours  01/23/20 0240           Virgina Norfolk, DO 01/23/20 2059

## 2020-02-04 ENCOUNTER — Ambulatory Visit (INDEPENDENT_AMBULATORY_CARE_PROVIDER_SITE_OTHER): Payer: Medicaid Other | Admitting: Internal Medicine

## 2020-02-04 ENCOUNTER — Other Ambulatory Visit: Payer: Medicaid Other

## 2020-02-04 ENCOUNTER — Encounter: Payer: Self-pay | Admitting: Internal Medicine

## 2020-02-04 VITALS — BP 118/64 | HR 77 | Ht 61.5 in | Wt 252.0 lb

## 2020-02-04 DIAGNOSIS — Z6841 Body Mass Index (BMI) 40.0 and over, adult: Secondary | ICD-10-CM

## 2020-02-04 DIAGNOSIS — R748 Abnormal levels of other serum enzymes: Secondary | ICD-10-CM

## 2020-02-04 DIAGNOSIS — K760 Fatty (change of) liver, not elsewhere classified: Secondary | ICD-10-CM

## 2020-02-04 DIAGNOSIS — K5730 Diverticulosis of large intestine without perforation or abscess without bleeding: Secondary | ICD-10-CM

## 2020-02-04 DIAGNOSIS — G8929 Other chronic pain: Secondary | ICD-10-CM

## 2020-02-04 DIAGNOSIS — R1013 Epigastric pain: Secondary | ICD-10-CM

## 2020-02-04 DIAGNOSIS — K429 Umbilical hernia without obstruction or gangrene: Secondary | ICD-10-CM

## 2020-02-04 HISTORY — DX: Umbilical hernia without obstruction or gangrene: K42.9

## 2020-02-04 HISTORY — DX: Fatty (change of) liver, not elsewhere classified: K76.0

## 2020-02-04 NOTE — Patient Instructions (Addendum)
LABS: Su proveedor le ha solicitado que vaya al nivel del stano para realizar anlisis de laboratorio antes de salir hoy. Presione "B" en el ascensor. El laboratorio est ubicado en la primera puerta a la izquierda al salir del Medical sales representative.  LAS LEYES DE ATENCIN MDICA Y LOS RESULTADOS DE MI GRFICO: Debido a los cambios recientes en las [address] de atencin mdica, es posible que vea los resultados de sus estudios de diagnstico por imgenes y de laboratorio en MyChart antes de que su proveedor haya tenido la oportunidad de Stage manager. Entendemos que, en algunos casos, puede haber [employer] o preocupantes para usted. No todos los resultados de laboratorio se obtienen en el mismo perodo de tiempo y el proveedor puede estar esperando varios resultados para Administrator, Civil Service. Por favor, dnos 48 horas para que su proveedor revise minuciosamente todos los resultados antes de comunicarse con la oficina para Engineer, structural.  Recuerde bombear y vaciar siguiendo su procedimiento.  Si tiene 64 aos o menos, su ndice de [employer] corporal debe estar entre 19-25. Su ndice de masa corporal es de 46,84 kg / m. Si esto est fuera del rango mencionado anteriormente, considere hacer un seguimiento con su Proveedor de Marine scientist.  Gracias por confiarme su cuidado gastrointestinal!  Stan Head, MD., Clementeen Graham

## 2020-02-04 NOTE — Progress Notes (Signed)
Lisa Herrera 24 y.o. 1995/11/23 [id number] Referred by: [facility] emergency department Assessment & Plan:   Encounter Diagnoses  Name Primary?  . Abdominal pain, chronic, epigastric Yes  . Abnormal transaminases   . Hepatic steatosis   . Lactating mother   . Umbilical hernia without obstruction and without gangrene   . Class 3 severe obesity due to excess calories without serious comorbidity with body mass index (BMI) of 45.0 to 49.9 in adult [facility])   . Diverticulosis of colon     She has had PPI refractory epigastric pain perhaps reflux symptoms as well.  Though she is only on a lower dose it is reasonable to proceed with upper endoscopy due to the persistence of symptoms.  Ulcer disease gastritis undertreated reflux esophagitis are possible.  I think this will provide reassurance for her as well.  I have explained to her that since she is still breast-feeding some it would make sense to pump her breast milk and discard it after her procedure and we will clarify the amount of time to do that at the time of her endoscopy and afterwards.The risks and benefits as well as alternatives of endoscopic procedure(s) have been discussed and reviewed. All questions answered. The patient agrees to proceed.  The interpreter was involved in this explanation.    I think her abnormal transaminases are most likely hepatic steatosis but will get an acute hepatitis panel.  She reports that she understood and had information regarding hepatic steatosis i.e. fatty liver.  She was counseled not to drink alcohol.  Agree with weight loss as recommended by primary care I have given her a Mediterranean diet handout as well.  Spanish language.  I think significant weight loss would improve her health status more than anything.  The increased abdominal girth probably has a lot to do with her symptoms.  It does sound like she probably had a gastroenteritis prior to all of this which might of flared  things.  Her bowel movements are now normal and she was reassured regarding the [place] in the stools that she is seen and also I reviewed diverticulosis with her and a Spanish language handout was provided.  I have also reviewed umbilical hernia and given a Spanish language handout.  CC: Redmon, Barth Kirks, PA   Subjective:   Chief Complaint: Abdominal pain gas and diarrhea  HPI 24 year old Lisa Herrera woman with long history of epigastric pain that is worsened in the last month to 6 weeks.  Seen in the emergency department on October 30 20-1 on January 01, 2020.  Notes reviewed. Presented October 8 with nausea vomiting and diarrhea and left upper quadrant and epigastric pain that it developed a week earlier.  No sick contacts. CT abdomen pelvis IMPRESSION: 1. No acute abdominopelvic abnormality. 2. Hepatic steatosis. 3. Large fat containing umbilical hernia. 4. Scattered colonic diverticula without CT evidence for diverticulitis. Electronically Signed   By: Constance Holster M.D.   On: 01/02/2020 00:43   Urine pregnancy negative twice lipase is normal twice as is CBC Transaminases are elevated 49 and 81 and 61 and 99 on these 2 visits respectively its AST and ALT.  Bilirubin normal alk phos normal.  She is status post cholecystectomy in the past when pregnant hepatitis B surface antigen negative   She says she has been on omeprazole for a long time without help.  She tried Carafate but did not think she needed it because she did not have an ulcer she saw that it  was for ulcer treatment.  She continues to have epigastric pain burning at times pressure bothersome.  It does not keep her from sleeping but is often postprandial.  Particularly after evening meal.  No vomiting perhaps some nausea.  She is concerned about the CT scan findings and points to the steatosis though she says that has been explained by her primary care provider who told her to eat differently and she is working on eating less  carbohydrates.  She is eating vegetables and grilled chicken.  She says she only gained about 20 pounds with her pregnancy.  She is also wondering about diverticulosis. Status post cholecystectomy, these pains and problems are not similar to that.  She is now having normal bowel movements but had copious mucus after the diarrhea stopped a couple of weeks ago.  Shows me a picture.  No bleeding.  Review of systems is otherwise negative.  History is taken through the use of a Spanish language interpreter. No Known Allergies Current Meds  Medication Sig  . acetaminophen (TYLENOL) 325 MG tablet Take 650 mg by mouth every 6 (six) hours as needed for moderate pain or headache.  . fluticasone (FLONASE) 50 MCG/ACT nasal spray Place 1 spray into both nostrils daily.  Marland Kitchen omeprazole (PRILOSEC) 20 MG capsule Take 20 mg by mouth daily.  . Prenatal Vit-Fe Fumarate-FA (PRENATAL MULTIVITAMIN) TABS tablet Take 1 tablet by mouth daily.    Past Medical History:  Diagnosis Date  . Alpha thalassemia silent carrier 05/29/2019  . Gallstones   . Migraine    Past Surgical History:  Procedure Laterality Date  . CESAREAN SECTION N/A 01/14/2016   Procedure: CESAREAN SECTION;  Surgeon: Osborne Oman, MD;  Location: [place];  Service: Obstetrics;  Laterality: N/A;  . CESAREAN SECTION N/A 11/17/2019   Procedure: CESAREAN SECTION;  Surgeon: Truett Mainland, DO;  Location: [place] LD ORS;  Service: Obstetrics;  Laterality: N/A;  . CHOLECYSTECTOMY    . TUBAL LIGATION Bilateral 11/17/2019   Procedure: BILATERAL TUBAL LIGATION;  Surgeon: Truett Mainland, DO;  Location: [place] LD ORS;  Service: Obstetrics;  Laterality: Bilateral;   Social History   Social History Narrative   Married, originally from [place], [place].   Son born October 2021 and a daughter born in 2017   No alcohol no tobacco or drug use   family history includes Breast cancer in her maternal aunt.   Review of Systems All other review of  systems appear negative.  She is still breast-feeding a little bit though her milk production has decreased.  Objective:   Physical Exam $RemoveBefor'@[id number]$  118/64   Pulse 77   Ht 5' 1.5" (1.562 m)   Wt 252 lb (114.3 kg)   LMP 02/17/2019 (Exact Date)   BMI 46.84 kg/m @  General:  Well-developed, well-nourished and in no acute distress Eyes:  anicteric. ENT:   Mouth and posterior pharynx free of lesions.  Neck:   supple w/o thyromegaly or mass.  Lungs: Clear to auscultation bilaterally. Heart:   S1S2, no rubs, murmurs, gallops. Abdomen: Obese soft, tender epigastrium, no hepatosplenomegaly, , or mass and BS+. Umbilical hernia  Lymph:  no cervical or supraclavicular adenopathy. Extremities:   no edema, cyanosis or clubbing Skin   no rash. Neuro:  A&O x 3.  Psych:  appropriate mood and  Affect.   Data Reviewed: ER notes, CT scan from the ER visit, images reviewed as well as labs as per HPI.

## 2020-02-05 ENCOUNTER — Other Ambulatory Visit: Payer: Self-pay | Admitting: Internal Medicine

## 2020-02-05 LAB — HEPATITIS PANEL, ACUTE
Hep A IgM: NONREACTIVE
Hep B C IgM: NONREACTIVE
Hepatitis B Surface Ag: NONREACTIVE
Hepatitis C Ab: NONREACTIVE
SIGNAL TO CUT-OFF: 0.01 (ref <1.00)

## 2020-02-05 LAB — SARS CORONAVIRUS 2 (TAT 6-24 HRS): SARS Coronavirus 2: NEGATIVE

## 2020-02-09 ENCOUNTER — Ambulatory Visit (AMBULATORY_SURGERY_CENTER): Payer: Medicaid Other | Admitting: Internal Medicine

## 2020-02-09 ENCOUNTER — Other Ambulatory Visit: Payer: Self-pay

## 2020-02-09 ENCOUNTER — Encounter: Payer: Self-pay | Admitting: Internal Medicine

## 2020-02-09 VITALS — BP 162/90 | HR 57 | Temp 96.9°F | Resp 12 | Ht 61.0 in | Wt 252.0 lb

## 2020-02-09 DIAGNOSIS — K317 Polyp of stomach and duodenum: Secondary | ICD-10-CM | POA: Diagnosis not present

## 2020-02-09 DIAGNOSIS — R1013 Epigastric pain: Secondary | ICD-10-CM | POA: Diagnosis not present

## 2020-02-09 MED ORDER — OMEPRAZOLE 40 MG PO CPDR
40.0000 mg | DELAYED_RELEASE_CAPSULE | Freq: Every day | ORAL | 2 refills | Status: DC
Start: 2020-02-09 — End: 2020-02-16

## 2020-02-09 MED ORDER — SODIUM CHLORIDE 0.9 % IV SOLN: 500.0000 mL | Freq: Once | INTRAVENOUS | Status: DC

## 2020-02-09 NOTE — Op Note (Signed)
[facility] Patient Name: Lisa Herrera Procedure Date: 02/09/2020 9:03 AM MRN: [id number] Endoscopist: Iva Boop , MD Age: 24 Referring MD:  Date of Birth: 1995-08-27 Gender: Female Account #: [id number] Procedure:                Upper GI endoscopy Indications:              Epigastric abdominal pain Medicines:                Propofol per Anesthesia, Monitored Anesthesia Care Procedure:                Pre-Anesthesia Assessment:                           - Prior to the procedure, a History and Physical                            was performed, and patient medications and                            allergies were reviewed. The patient's tolerance of                            previous anesthesia was also reviewed. The risks                            and benefits of the procedure and the sedation                            options and risks were discussed with the patient.                            All questions were answered, and informed consent                            was obtained. Prior Anticoagulants: The patient has                            taken no previous anticoagulant or antiplatelet                            agents. ASA Grade Assessment: III - A patient with                            severe systemic disease. After reviewing the risks                            and benefits, the patient was deemed in                            satisfactory condition to undergo the procedure.                           After obtaining informed consent, the endoscope was  passed under direct vision. Throughout the                            procedure, the patient's blood pressure, pulse, and                            oxygen saturations were monitored continuously. The                            Endoscope was introduced through the mouth, and                            advanced to the second part of duodenum. The upper                            GI  endoscopy was accomplished without difficulty.                            The patient tolerated the procedure well. Scope In: Scope Out: Findings:                 Two diminutive sessile polyps were found in the                            gastric body. Biopsies were taken with a cold                            forceps for histology. Verification of patient                            identification for the specimen was done. Estimated                            blood loss was minimal.                           The exam was otherwise without abnormality.                           The gastroesophageal flap valve was visualized                            endoscopically and classified as Hill Grade IV (no                            fold, wide open lumen, hiatal hernia present).                           The cardia and gastric fundus were otherwise normal                            on retroflexion. Complications:            No immediate complications. Estimated Blood Loss:     Estimated blood loss was minimal. Impression:               -  Two gastric polyps. Biopsied.                           - The examination was otherwise normal.                           - Gastroesophageal flap valve classified as Hill                            Grade IV (no fold, wide open lumen, hiatal hernia                            present). Recommendation:           - Patient has a contact number available for                            emergencies. The signs and symptoms of potential                            delayed complications were discussed with the                            patient. Return to normal activities tomorrow.                            Written discharge instructions were provided to the                            patient.                           - Resume previous diet.                           - Continue present medications.                           - Await pathology results.                            - Will arrange follow-up after patology review                           change omeprazole to 40 mg qd                           ? role of fatty liver in this. Iva Boop, MD 02/09/2020 9:30:57 AM This report has been signed electronically.

## 2020-02-09 NOTE — Progress Notes (Signed)
Called to room to assist during endoscopic procedure.  Patient ID and intended procedure confirmed with present staff. Received instructions for my participation in the procedure from the performing physician.  

## 2020-02-09 NOTE — Progress Notes (Signed)
A/ox3, pleased with MAC, report to RN 

## 2020-02-09 NOTE — Progress Notes (Signed)
Erin Hearing Interpreter today. No changes in her medical or surgical history.    PT is breast feeding and wears CPAP intermittently.   [place] vitals and PH IV.

## 2020-02-09 NOTE — Patient Instructions (Addendum)
There were 2 small stomach polyps that are not likely to be related to stomach pain. They look ok - not cancer or anything bad.  I took biopsies.  All else is ok.  I have changed omeprazole from 20 to 40 milligrams to see if that helps you feel better.  We will call with biopsy results and recommendations for follow-up.  I appreciate the opportunity to care for you. Iva Boop, MD, [facility]   Discharge instructions given. Biopsies taken. Resume previous medications. Change omeprazole to 40mg  every day. YOU HAD AN ENDOSCOPIC PROCEDURE TODAY AT THE [facility]:   Refer to the procedure report that was given to you for any specific questions about what was found during the examination.  If the procedure report does not answer your questions, please call your gastroenterologist to clarify.  If you requested that your care partner not be given the details of your procedure findings, then the procedure report has been included in a sealed envelope for you to review at your convenience later.  YOU SHOULD EXPECT: Some feelings of bloating in the abdomen. Passage of more gas than usual.  Walking can help get rid of the air that was put into your GI tract during the procedure and reduce the bloating. If you had a lower endoscopy (such as a colonoscopy or flexible sigmoidoscopy) you may notice spotting of blood in your stool or on the toilet paper. If you underwent a bowel prep for your procedure, you may not have a normal bowel movement for a few days.  Please Note:  You might notice some irritation and congestion in your nose or some drainage.  This is from the oxygen used during your procedure.  There is no need for concern and it should clear up in a day or so.  SYMPTOMS TO REPORT IMMEDIATELY:    Following upper endoscopy (EGD)  Vomiting of blood or coffee ground material  New chest pain or pain under the shoulder blades  Painful or persistently difficult swallowing  New  shortness of breath  Fever of 100F or higher  Black, tarry-looking stools  For urgent or emergent issues, a gastroenterologist can be reached at any hour by calling [phone]. Do not use MyChart messaging for urgent concerns.    DIET:  We do recommend a small meal at first, but then you may proceed to your regular diet.  Drink plenty of fluids but you should avoid alcoholic beverages for 24 hours.  ACTIVITY:  You should plan to take it easy for the rest of today and you should NOT DRIVE or use heavy machinery until tomorrow (because of the sedation medicines used during the test).    FOLLOW UP: Our staff will call the number listed on your records 48-72 hours following your procedure to check on you and address any questions or concerns that you may have regarding the information given to you following your procedure. If we do not reach you, we will leave a message.  We will attempt to reach you two times.  During this call, we will ask if you have developed any symptoms of COVID 19. If you develop any symptoms (ie: fever, flu-like symptoms, shortness of breath, cough etc.) before then, please call [phone].  If you test positive for Covid 19 in the 2 weeks post procedure, please call and report this information to [phone].    If any biopsies were taken you will be contacted by phone or by letter within the next  1-3 weeks.  Please call us at [phone] if you have not heard about the biopsies in 3 weeks.    SIGNATURES/CONFIDENTIALITY: You and/or your care partner have signed paperwork which will be entered into your electronic medical record.  These signatures attest to the fact that that the information above on your After Visit Summary has been reviewed and is understood.  Full responsibility of the confidentiality of this discharge information lies with you and/or your care-partner.

## 2020-02-10 ENCOUNTER — Ambulatory Visit: Payer: Medicaid Other | Admitting: Internal Medicine

## 2020-02-11 ENCOUNTER — Telehealth: Payer: Self-pay

## 2020-02-11 ENCOUNTER — Ambulatory Visit: Payer: Medicaid Other | Admitting: Gastroenterology

## 2020-02-11 NOTE — Telephone Encounter (Signed)
  Follow up Call-  Call back number 02/09/2020  Post procedure Call Back phone  # [phone]  Permission to leave phone message Yes  Some recent data might be hidden     Patient questions:  Do you have a fever, pain , or abdominal swelling? No. Pain Score  0 *  Have you tolerated food without any problems? Yes.    Have you been able to return to your normal activities? Yes.    Do you have any questions about your discharge instructions: Diet   No. Medications  No. Follow up visit  No.  Do you have questions or concerns about your Care? No.  Actions: * If pain score is 4 or above: No action needed, pain <4.  1. Have you developed a fever since your procedure? no  2.   Have you had an respiratory symptoms (SOB or cough) since your procedure? no  3.   Have you tested positive for COVID 19 since your procedure no  4.   Have you had any family members/close contacts diagnosed with the COVID 19 since your procedure?  no   If yes to any of these questions please route to Laverna Peace, RN and Karlton Lemon, RN

## 2020-02-16 ENCOUNTER — Other Ambulatory Visit: Payer: Self-pay | Admitting: Internal Medicine

## 2020-02-16 MED ORDER — OMEPRAZOLE 40 MG PO CPDR: 40.0000 mg | DELAYED_RELEASE_CAPSULE | Freq: Every day | ORAL | 2 refills | Status: AC

## 2020-02-22 ENCOUNTER — Telehealth: Payer: Self-pay | Admitting: Internal Medicine

## 2020-02-22 ENCOUNTER — Encounter: Payer: Self-pay | Admitting: Gastroenterology

## 2020-02-22 ENCOUNTER — Ambulatory Visit (INDEPENDENT_AMBULATORY_CARE_PROVIDER_SITE_OTHER): Payer: Medicaid Other | Admitting: Gastroenterology

## 2020-02-22 VITALS — BP 123/72 | HR 76 | Ht 62.0 in | Wt 253.0 lb

## 2020-02-22 DIAGNOSIS — R195 Other fecal abnormalities: Secondary | ICD-10-CM | POA: Diagnosis not present

## 2020-02-22 DIAGNOSIS — R197 Diarrhea, unspecified: Secondary | ICD-10-CM

## 2020-02-22 DIAGNOSIS — R109 Unspecified abdominal pain: Secondary | ICD-10-CM | POA: Diagnosis not present

## 2020-02-22 NOTE — Progress Notes (Signed)
02/22/2020 Lisa Herrera [id number] 01-05-1996   HISTORY OF PRESENT ILLNESS: This is a 24 year old female who is recently known to Dr. Leone Payor.  She actually had an endoscopy performed by him earlier this month.  She is here today with complaints of left-sided abdominal pain, diarrhea,and mucus in her stool.  She says that this all began after the birth of her child in August, but she does also recall having issues with diarrhea in the past prior to her pregnancy as well, however.  She denies any rectal bleeding.  She has CT scan of the abdomen and pelvis that showed diverticulosis without diverticulitis.  Patient is primarily Spanish-speaking so the entire visit was performed via interpreter/translator.   Past Medical History:  Diagnosis Date  . Alpha thalassemia silent carrier 05/29/2019  . Gallstones   . Hepatic steatosis 02/04/2020  . Migraine   . Umbilical hernia without obstruction and without gangrene 02/04/2020   Past Surgical History:  Procedure Laterality Date  . CESAREAN SECTION N/A 01/14/2016   Procedure: CESAREAN SECTION;  Surgeon: Tereso Newcomer, MD;  [facility];  Service: Obstetrics;  Laterality: N/A;  . CESAREAN SECTION N/A 11/17/2019   Procedure: CESAREAN SECTION;  Surgeon: Levie Heritage, DO;  Location: [facility];  Service: Obstetrics;  Laterality: N/A;  . CHOLECYSTECTOMY    . TUBAL LIGATION Bilateral 11/17/2019   Procedure: BILATERAL TUBAL LIGATION;  Surgeon: Levie Heritage, DO;  Location: [facility];  Service: Obstetrics;  Laterality: Bilateral;    reports that she has never smoked. She has never used smokeless tobacco. She reports that she does not drink alcohol and does not use drugs. family history includes Breast cancer in her maternal aunt. No Known Allergies    Outpatient Encounter Medications as of 02/22/2020  Medication Sig  . omeprazole (PRILOSEC) 40 MG capsule Take 1 capsule (40 mg total) by mouth daily before breakfast.  .  Prenatal Vit-Fe Fumarate-FA (PRENATAL MULTIVITAMIN) TABS tablet Take 1 tablet by mouth daily.   . [DISCONTINUED] acetaminophen (TYLENOL) 325 MG tablet Take 650 mg by mouth every 6 (six) hours as needed for moderate pain or headache.  . [DISCONTINUED] fluticasone (FLONASE) 50 MCG/ACT nasal spray Place 1 spray into both nostrils daily.   No facility-administered encounter medications on file as of 02/22/2020.     REVIEW OF SYSTEMS  : All other systems reviewed and negative except where noted in the History of Present Illness.   PHYSICAL EXAM: BP 123/72   Pulse 76   Ht 5\' 2"  (1.575 m)   Wt 253 lb (114.8 kg)   BMI 46.27 kg/m  General: Well developed Hispanic female in no acute distress Head: Normocephalic and atraumatic Eyes:  Sclerae anicteric, conjunctiva pink. Ears: Normal auditory acuity Lungs: Clear throughout to auscultation; no W/R/R. Heart: Regular rate and rhythm; no M/R/G. Abdomen: Soft, non-distended.  BS present.  LUQ and mid-abdominal TTP. Rectal:  Will be done at the time of colonoscopy. Musculoskeletal: Symmetrical with no gross deformities  Skin: No lesions on visible extremities Extremities: No edema  Neurological: Alert oriented x 4, grossly non-focal Psychological:  Alert and cooperative. Normal mood and affect  ASSESSMENT AND PLAN: *24 year old female with complaints of diarrhea with mucus in her stool and left-sided abdominal pain.  She reports that her pain has been present since the birth of her child in August, but recalls having diarrhea issues in the past as well.  CT scan showed diverticulosis without diverticulitis.  Could be IBS, but  will rule out IBD such as Crohn's and UC as well.  We will plan for colonoscopy with Dr. Leone Payor.  I offered to give her antispasmodic, but dicyclomine is contraindicated while breast-feeding.  Levsin says to use with caution.  I advised her to check with her pediatrician to get their input before we will prescribe that.  The  risks, benefits, and alternatives to colonoscopy were discussed with the patient and she consent to proceed.   CC:  Milus Height, PA

## 2020-02-22 NOTE — Telephone Encounter (Signed)
Please offer the patient the 230 with Doug Sou, PA today.

## 2020-02-22 NOTE — Telephone Encounter (Signed)
Left msg on pt's vm to call back 

## 2020-02-22 NOTE — Patient Instructions (Addendum)
If you are age 26 or older, your body mass index should be between 23-30. Your Body mass index is 46.27 kg/m. If this is out of the aforementioned range listed, please consider follow up with your Primary Care Provider.  If you are age 84 or younger, your body mass index should be between 19-25. Your Body mass index is 46.27 kg/m. If this is out of the aformentioned range listed, please consider follow up with your Primary Care Provider.   Talk with your pediatrician about starting Levsin while breastfeeding. Call office if able to start Levsin.  Thank you for choosing me and [facility].  Doug Sou, PA-C

## 2020-02-22 NOTE — Telephone Encounter (Signed)
Pt is requesting a call back from a nurse to discuss her left lower abdominal pain, pt would like to be seen sometime soon but no appts are available.  Spanish speaking pt

## 2020-02-24 ENCOUNTER — Ambulatory Visit: Payer: Medicaid Other | Admitting: Dietician

## 2020-02-26 ENCOUNTER — Other Ambulatory Visit: Payer: Self-pay

## 2020-02-26 ENCOUNTER — Emergency Department (HOSPITAL_BASED_OUTPATIENT_CLINIC_OR_DEPARTMENT_OTHER)
Admission: EM | Admit: 2020-02-26 | Discharge: 2020-02-26 | Disposition: A | Discharge status: Home / Self Care | Payer: Medicaid Other | Attending: Emergency Medicine | Admitting: Emergency Medicine

## 2020-02-26 ENCOUNTER — Telehealth: Payer: Self-pay | Admitting: Gastroenterology

## 2020-02-26 ENCOUNTER — Encounter (HOSPITAL_BASED_OUTPATIENT_CLINIC_OR_DEPARTMENT_OTHER): Payer: Self-pay | Admitting: Emergency Medicine

## 2020-02-26 DIAGNOSIS — R1084 Generalized abdominal pain: Secondary | ICD-10-CM | POA: Insufficient documentation

## 2020-02-26 DIAGNOSIS — R110 Nausea: Secondary | ICD-10-CM | POA: Diagnosis not present

## 2020-02-26 DIAGNOSIS — R109 Unspecified abdominal pain: Secondary | ICD-10-CM | POA: Diagnosis present

## 2020-02-26 DIAGNOSIS — R197 Diarrhea, unspecified: Secondary | ICD-10-CM | POA: Insufficient documentation

## 2020-02-26 DIAGNOSIS — K219 Gastro-esophageal reflux disease without esophagitis: Secondary | ICD-10-CM | POA: Diagnosis not present

## 2020-02-26 LAB — COMPREHENSIVE METABOLIC PANEL
ALT: 103 U/L — ABNORMAL HIGH (ref 0–44)
AST: 68 U/L — ABNORMAL HIGH (ref 15–41)
Albumin: 4 g/dL (ref 3.5–5.0)
Alkaline Phosphatase: 96 U/L (ref 38–126)
Anion gap: 8 (ref 5–15)
BUN: 8 mg/dL (ref 6–20)
CO2: 24 mmol/L (ref 22–32)
Calcium: 9 mg/dL (ref 8.9–10.3)
Chloride: 104 mmol/L (ref 98–111)
Creatinine, Ser: 0.4 mg/dL — ABNORMAL LOW (ref 0.44–1.00)
GFR, Estimated: >60 mL/min (ref >60)
Glucose, Bld: 102 mg/dL — ABNORMAL HIGH (ref 70–99)
Potassium: 3.7 mmol/L (ref 3.5–5.1)
Sodium: 136 mmol/L (ref 135–145)
Total Bilirubin: 0.4 mg/dL (ref 0.3–1.2)
Total Protein: 7.1 g/dL (ref 6.5–8.1)

## 2020-02-26 LAB — PREGNANCY, URINE: Preg Test, Ur: NEGATIVE

## 2020-02-26 LAB — CBC
HCT: 43.3 % (ref 36.0–46.0)
Hemoglobin: 14 g/dL (ref 12.0–15.0)
MCH: 28.1 pg (ref 26.0–34.0)
MCHC: 32.3 g/dL (ref 30.0–36.0)
MCV: 86.8 fL (ref 80.0–100.0)
Platelets: 290 10*3/uL (ref 150–400)
RBC: 4.99 MIL/uL (ref 3.87–5.11)
RDW: 13.1 % (ref 11.5–15.5)
WBC: 5.2 10*3/uL (ref 4.0–10.5)
nRBC: 0 % (ref 0.0–0.2)

## 2020-02-26 LAB — URINALYSIS, ROUTINE W REFLEX MICROSCOPIC
Bilirubin Urine: NEGATIVE
Glucose, UA: NEGATIVE mg/dL
Hgb urine dipstick: NEGATIVE
Ketones, ur: NEGATIVE mg/dL
Leukocytes,Ua: NEGATIVE
Nitrite: NEGATIVE
Protein, ur: NEGATIVE mg/dL
Specific Gravity, Urine: <1.005 — ABNORMAL LOW (ref 1.005–1.030)
pH: 7 (ref 5.0–8.0)

## 2020-02-26 LAB — LIPASE, BLOOD: Lipase: 22 U/L (ref 11–51)

## 2020-02-26 MED ORDER — ONDANSETRON 4 MG PO TBDP: 4.0000 mg | ORAL_TABLET | Freq: Three times a day (TID) | ORAL | 0 refills | Status: DC | PRN

## 2020-02-26 NOTE — ED Provider Notes (Signed)
Emergency Department Provider Note   I have reviewed the triage vital signs and the nursing notes.   HISTORY  Chief Complaint Abdominal Pain  Spanish Interpreter Jari Favre used for encounter  HPI Lisa Herrera is a 24 y.o. female with PMH reviewed including recent ED visits returns to the ED with abdominal pain and diarrhea. Symptoms have been ongoing for the last several months. No radiation or symptoms or modifying factors. No fever or chills. She has established care with GI and has a colonoscopy on Wednesday. Patient is actively breastfeeding. No CP or SOB. Pain is diffuse and cramping in nature.   Past Medical History:  Diagnosis Date  . Alpha thalassemia silent carrier 05/29/2019  . Gallstones   . Hepatic steatosis 02/04/2020  . Migraine   . Umbilical hernia without obstruction and without gangrene 02/04/2020    Patient Active Problem List   Diagnosis Date Noted  . Diarrhea 02/22/2020  . Mucus in stool 02/22/2020  . Left sided abdominal pain 02/22/2020  . Hepatic steatosis 02/04/2020  . Umbilical hernia without obstruction and without gangrene 02/04/2020  . Postpartum care following cesarean delivery 12/11/2019  . Cesarean delivery delivered 11/17/2019  . History of bilateral tubal ligation 11/17/2019  . Axillary pain, left 11/04/2019  . Sleep apnea 10/28/2019  . BMI 50.0-59.9, adult (HCC) 10/28/2019  . Language barrier 09/01/2019  . Gastroesophageal reflux disease with esophagitis without hemorrhage 05/12/2019  . Obesity affecting pregnancy in third trimester, antepartum 05/12/2019  . Hx of cholelithiasis 05/12/2019  . Supervision of other normal pregnancy, antepartum 04/08/2019  . S/P cesarean section for arrest of descent 01/13/2016  . Maternal varicella, non-immune 01/12/2016    Past Surgical History:  Procedure Laterality Date  . CESAREAN SECTION N/A 01/14/2016   Procedure: CESAREAN SECTION;  Surgeon: Tereso Newcomer, MD;  [facility]: [facility];   Service: Obstetrics;  Laterality: N/A;  . CESAREAN SECTION N/A 11/17/2019   Procedure: CESAREAN SECTION;  Surgeon: Levie Heritage, DO;  Location: [facility];  Service: Obstetrics;  Laterality: N/A;  . CHOLECYSTECTOMY    . TUBAL LIGATION Bilateral 11/17/2019   Procedure: BILATERAL TUBAL LIGATION;  Surgeon: Levie Heritage, DO;  Location: [facility];  Service: Obstetrics;  Laterality: Bilateral;    Allergies Patient has no known allergies.  Family History  Problem Relation Age of Onset  . Breast cancer Maternal Aunt        50's  . Colon cancer Neg Hx   . Stomach cancer Neg Hx   . Esophageal cancer Neg Hx     Social History Social History   Tobacco Use  . Smoking status: Never Smoker  . Smokeless tobacco: Never Used  Vaping Use  . Vaping Use: Never used  Substance Use Topics  . Alcohol use: No    Alcohol/week: 0.0 standard drinks  . Drug use: No    Review of Systems  Constitutional: No fever/chills Eyes: No visual changes. ENT: No sore throat. Cardiovascular: Denies chest pain. Respiratory: Denies shortness of breath. Gastrointestinal: Positive abdominal pain. Positive nausea, no vomiting. Positive diarrhea.  No constipation. Genitourinary: Negative for dysuria. Musculoskeletal: Negative for back pain. Skin: Negative for rash. Neurological: Negative for headaches, focal weakness or numbness.  10-point ROS otherwise negative.  ____________________________________________   PHYSICAL EXAM:  VITAL SIGNS: ED Triage Vitals  Enc Vitals Group     BP 02/26/20 1207 (!) 139/96     Pulse Rate 02/26/20 1207 74     Resp 02/26/20 1207 16  Temp 02/26/20 1207 98 F (36.7 C)     Temp Source 02/26/20 1207 Oral     SpO2 02/26/20 1207 100 %     Weight 02/26/20 1207 253 lb (114.8 kg)     Height 02/26/20 1207 5\' 2"  (1.575 m)   Constitutional: Alert and oriented. Well appearing and in no acute distress. Eyes: Conjunctivae are normal.  Head: Atraumatic. Nose: No  congestion/rhinnorhea. Mouth/Throat: Mucous membranes are moist.  Neck: No stridor.   Cardiovascular: Normal rate, regular rhythm. Good peripheral circulation. Grossly normal heart sounds.   Respiratory: Normal respiratory effort.  No retractions. Lungs CTAB. Gastrointestinal: Soft and nontender. No distention.  Musculoskeletal: No lower extremity tenderness nor edema. No gross deformities of extremities. Neurologic:  Normal speech and language. Skin:  Skin is warm, dry and intact. No rash noted.  ____________________________________________   LABS (all labs ordered are listed, but only abnormal results are displayed)  Labs Reviewed  COMPREHENSIVE METABOLIC PANEL - Abnormal; Notable for the following components:      Result Value   Glucose, Bld 102 (*)    Creatinine, Ser 0.40 (*)    AST 68 (*)    ALT 103 (*)    All other components within normal limits  URINALYSIS, ROUTINE W REFLEX MICROSCOPIC - Abnormal; Notable for the following components:   Specific Gravity, Urine <1.005 (*)    All other components within normal limits  LIPASE, BLOOD  CBC  PREGNANCY, URINE   ____________________________________________  RADIOLOGY  None. CT scan from early October reviewed.  ____________________________________________   PROCEDURES  Procedure(s) performed:   Procedures  None  ____________________________________________   INITIAL IMPRESSION / ASSESSMENT AND PLAN / ED COURSE  Pertinent labs & imaging results that were available during my care of the patient were reviewed by me and considered in my medical decision making (see chart for details).   Patient presents to the ED with abdominal pain and nausea. She is established with GI and has Colonoscopy on Wednesday. No focal tenderness over the gallbladder. No peritoneal findings. Doubt acute cholecystitis, appendicitis. No infectious symptoms to suspect infectious colitis. Labs reviewed and largely unremarkable. LFTs mildly  elevated but normal bilirubin. Plan for close GI and PCP follow up. Discussed impression and plan with patient in detail using Spanish interpreter.    ____________________________________________  FINAL CLINICAL IMPRESSION(S) / ED DIAGNOSES  Final diagnoses:  Generalized abdominal pain  Nausea    NEW OUTPATIENT MEDICATIONS STARTED DURING THIS VISIT:  New Prescriptions   ONDANSETRON (ZOFRAN ODT) 4 MG DISINTEGRATING TABLET    Take 1 tablet (4 mg total) by mouth every 8 (eight) hours as needed.    Note:  This document was prepared using Dragon voice recognition software and may include unintentional dictation errors.  Thursday, MD, [facility]    Jash Wahlen, [facility], MD 02/26/20 [phone]

## 2020-02-26 NOTE — Telephone Encounter (Signed)
Tried calling the pt with no answer. She is currently in the ED at [facility].

## 2020-02-26 NOTE — ED Triage Notes (Signed)
Generalized abdominal pain for 3 weeks.  Noted mucous and blood in stool for several days.  Some nausea, no vomiting, diarrhea for 3 months.  No fever.

## 2020-02-26 NOTE — Telephone Encounter (Signed)
Pt is requesting a call back from a nurse, pt states she is on her way to [facility] ED to be evaluated for abdominal pain and blood in her stools, pt would like a call back from a nurse to further discuss, pt is spanish speaking.

## 2020-02-29 ENCOUNTER — Other Ambulatory Visit: Payer: Self-pay | Admitting: Internal Medicine

## 2020-02-29 LAB — SARS CORONAVIRUS 2 (TAT 6-24 HRS): SARS Coronavirus 2: NEGATIVE

## 2020-03-02 ENCOUNTER — Encounter: Payer: Self-pay | Admitting: Internal Medicine

## 2020-03-02 ENCOUNTER — Ambulatory Visit (AMBULATORY_SURGERY_CENTER): Payer: Medicaid Other | Admitting: Internal Medicine

## 2020-03-02 ENCOUNTER — Other Ambulatory Visit: Payer: Self-pay

## 2020-03-02 VITALS — BP 138/78 | HR 73 | Temp 97.8°F | Resp 12 | Ht 62.0 in | Wt 253.0 lb

## 2020-03-02 DIAGNOSIS — R197 Diarrhea, unspecified: Secondary | ICD-10-CM

## 2020-03-02 MED ORDER — SODIUM CHLORIDE 0.9 % IV SOLN: 500.0000 mL | Freq: Once | INTRAVENOUS | Status: DC

## 2020-03-02 MED ORDER — DICYCLOMINE HCL 20 MG PO TABS: 20.0000 mg | ORAL_TABLET | Freq: Three times a day (TID) | ORAL | 1 refills | Status: DC

## 2020-03-02 NOTE — Progress Notes (Signed)
VS by CW.   Due to language barrier, an interpreter, Lilli Light,  was present during the history-taking and subsequent discussion (and for part of the physical exam) with this patient.  I have reviewed the patient's medical history in detail and updated the computerized patient record.

## 2020-03-02 NOTE — Patient Instructions (Addendum)
Colonoscopy examination is all normal.  I think you have a problem called irritable bowel syndrome or IBS.  It can make you feel tired but is not dangerous.  Sometimes stress and anxiety make it worse.  I did take biopsies of the colon to check for inflammation no I think they will be okay I wanted to be sure.  Once I get those results I will let you know.  Will try dicyclomine medication to help  I appreciate the opportunity to care for you.  Iva Boop, MD, [facility]  Resume previous medications.  Await pathology for final recommendations.  Handouts on findings given to patient.    USTED TUVO UN PROCEDIMIENTO ENDOSCPICO HOY EN EL [facility]:   Lea el informe del procedimiento que se le entreg para cualquier pregunta especfica sobre lo que se Dentist.  Si el informe del examen no responde a sus preguntas, por favor llame a su gastroenterlogo para aclararlo.  Si usted solicit que no se le den [employer] de lo que se Clinical cytogeneticist en su procedimiento al [employer] va a cuidar, entonces el informe del procedimiento se ha incluido en un sobre sellado para que usted lo revise despus cuando le sea ms conveniente.   LO QUE PUEDE ESPERAR: Algunas sensaciones de hinchazn en el abdomen.  Puede tener ms gases de lo normal.  El caminar puede ayudarle a eliminar el aire que se le puso en el tracto gastrointestinal durante el procedimiento y reducir la hinchazn.  Si le hicieron una endoscopia inferior (como una colonoscopia o una sigmoidoscopia flexible), podra notar manchas de sangre en las heces fecales o en el papel higinico.  Si se someti a una preparacin intestinal para su procedimiento, es posible que no tenga una evacuacin intestinal normal durante [employer].   Tenga en cuenta:  Es posible que note un poco de irritacin y congestin en la nariz o algn drenaje.  Esto es debido al oxgeno [employer] durante su procedimiento.  No hay que preocuparse y esto  debe desaparecer ms o Regulatory affairs officer.   SNTOMAS PARA REPORTAR INMEDIATAMENTE:  Despus de una endoscopia inferior (colonoscopia o sigmoidoscopia flexible):  Cantidades excesivas de sangre en las heces fecales  Sensibilidad significativa o empeoramiento de los dolores abdominales   Hinchazn aguda del abdomen que antes no tena   Fiebre de 100F o ms     Para asuntos urgentes o de Associate Professor, puede comunicarse con un gastroenterlogo a cualquier hora llamando al [phone].  DIETA:  Recomendamos una comida pequea al principio, pero luego puede continuar con su dieta normal.  Tome muchos lquidos, Tax adviser las bebidas alcohlicas durante 24 horas.    ACTIVIDAD:  Debe planear tomarse las cosas con calma por el resto del da y no debe CONDUCIR ni usar maquinaria pesada Patent examiner (debido a los medicamentos de sedacin utilizados durante el examen).     SEGUIMIENTO: Nuestro personal llamar al nmero que aparece en su historial al siguiente da hbil de su procedimiento para ver cmo se siente y para responder cualquier pregunta o inquietud que pueda tener con respecto a la informacin que se le dio despus del procedimiento. Si no podemos contactarle, le dejaremos un mensaje.  Sin embargo, si se siente bien y no tiene English as a second language teacher, no es necesario que nos devuelva la llamada.  Asumiremos que ha regresado a sus actividades diarias normales sin incidentes. Si se le tomaron algunas biopsias, le contactaremos por telfono o por  carta en las prximas 3 semanas.  Si no ha sabido [employer] biopsias en el transcurso de 3 semanas, por favor llmenos al [phone].   FIRMAS/CONFIDENCIALIDAD: Usted y/o el acompaante que le cuide han firmado documentos que se ingresarn en su historial mdico electrnico.  Estas firmas atestiguan el hecho de que la informacin anterior

## 2020-03-02 NOTE — Progress Notes (Signed)
Called to room to assist during endoscopic procedure.  Patient ID and intended procedure confirmed with present staff. Received instructions for my participation in the procedure from the performing physician.  

## 2020-03-02 NOTE — Progress Notes (Signed)
A and O x3. Report to RN. Tolerated MAC anesthesia well.

## 2020-03-02 NOTE — Op Note (Addendum)
[facility] Patient Name: Lisa Herrera Procedure Date: 03/02/2020 8:33 AM MRN: [id number] Endoscopist: Iva Boop , MD Age: 24 Referring MD:  Date of Birth: Jun 09, 1995 Gender: Female Account #: [id number] Procedure:                Colonoscopy Indications:              Clinically significant diarrhea of unexplained                            origin Medicines:                Propofol per Anesthesia, Monitored Anesthesia Care Procedure:                Pre-Anesthesia Assessment:                           - Prior to the procedure, a History and Physical                            was performed, and patient medications and                            allergies were reviewed. The patient's tolerance of                            previous anesthesia was also reviewed. The risks                            and benefits of the procedure and the sedation                            options and risks were discussed with the patient.                            All questions were answered, and informed consent                            was obtained. Prior Anticoagulants: The patient has                            taken no previous anticoagulant or antiplatelet                            agents. ASA Grade Assessment: III - A patient with                            severe systemic disease. After reviewing the risks                            and benefits, the patient was deemed in                            satisfactory condition to undergo the procedure.  After obtaining informed consent, the colonoscope                            was passed under direct vision. Throughout the                            procedure, the patient's blood pressure, pulse, and                            oxygen saturations were monitored continuously. The                            Colonoscope was introduced through the anus and                            advanced to the the terminal  ileum, with                            identification of the appendiceal orifice and IC                            valve. The terminal ileum, ileocecal valve,                            appendiceal orifice, and rectum were photographed.                            The quality of the bowel preparation was good. The                            colonoscopy was performed without difficulty. The                            patient tolerated the procedure well. The bowel                            preparation used was Miralax via split dose                            instruction. Scope In: 9:08:59 AM Scope Out: 9:20:20 AM Scope Withdrawal Time: 0 hours 9 minutes 41 seconds  Total Procedure Duration: 0 hours 11 minutes 21 seconds  Findings:                 The perianal and digital rectal examinations were                            normal.                           The colon (entire examined portion) appeared normal.                           No additional abnormalities were found on  retroflexion.                           The terminal ileum appeared normal.                           Biopsies for histology were taken with a cold                            forceps from the ascending colon, transverse colon,                            descending colon, sigmoid colon and rectum for                            evaluation of microscopic colitis. Estimated blood                            loss was minimal. Complications:            No immediate complications. Estimated blood loss:                            None. Estimated Blood Loss:     Estimated blood loss: none. Impression:               - The entire examined colon is normal.                           - The examined portion of the ileum was normal.                           - Biopsies were taken with a cold forceps from the                            ascending colon, transverse colon, descending                             colon, sigmoid colon and rectum for evaluation of                            microscopic colitis. SUSPECT SHE HAS IRRITABLE                            BOWEL SYNDROME Recommendation:           - Patient has a contact number available for                            emergencies. The signs and symptoms of potential                            delayed complications were discussed with the                            patient. Return to normal activities tomorrow.  Written discharge instructions were provided to the                            patient.                           - Resume previous diet.                           - Continue present medications. OK to try                            dicyclomine - she stopped breast-feeding                           - Await pathology results.                           - will arrange f/u in office - will benefit from                            anxiety Tx - d/w husband in recovery - realizes she                            is anxious - Iva Boop, MD 03/02/2020 9:29:41 AM This report has been signed electronically.

## 2020-03-04 ENCOUNTER — Telehealth: Payer: Self-pay | Admitting: *Deleted

## 2020-03-04 NOTE — Telephone Encounter (Signed)
  Follow up Call-  Call back number 03/02/2020 02/09/2020  Post procedure Call Back phone  # [phone]  Permission to leave phone message Yes Yes  Some recent data might be hidden     Patient questions:  Do you have a fever, pain , or abdominal swelling? No. Pain Score  0 *  Have you tolerated food without any problems? Yes.    Have you been able to return to your normal activities? Yes.    Do you have any questions about your discharge instructions: Diet   No. Medications  No. Follow up visit  No.  Do you have questions or concerns about your Care? No.  Actions: * If pain score is 4 or above: No action needed, pain <4.  1. Have you developed a fever since your procedure? no  2.   Have you had an respiratory symptoms (SOB or cough) since your procedure? no  3.   Have you tested positive for COVID 19 since your procedure no  4.   Have you had any family members/close contacts diagnosed with the COVID 19 since your procedure? no   If yes to any of these questions please route to Laverna Peace, RN and Karlton Lemon, RN

## 2020-03-07 ENCOUNTER — Encounter: Payer: Self-pay | Admitting: General Practice

## 2020-03-10 ENCOUNTER — Other Ambulatory Visit: Payer: Self-pay

## 2020-03-10 DIAGNOSIS — F32A Depression, unspecified: Secondary | ICD-10-CM

## 2020-03-21 ENCOUNTER — Ambulatory Visit: Payer: Medicaid Other | Admitting: Dietician

## 2020-03-24 ENCOUNTER — Encounter (HOSPITAL_BASED_OUTPATIENT_CLINIC_OR_DEPARTMENT_OTHER): Payer: Self-pay

## 2020-03-24 ENCOUNTER — Emergency Department (HOSPITAL_BASED_OUTPATIENT_CLINIC_OR_DEPARTMENT_OTHER)
Admission: EM | Admit: 2020-03-24 | Discharge: 2020-03-24 | Disposition: A | Discharge status: Home / Self Care | Payer: Medicaid Other | Attending: Emergency Medicine | Admitting: Emergency Medicine

## 2020-03-24 ENCOUNTER — Other Ambulatory Visit: Payer: Self-pay

## 2020-03-24 DIAGNOSIS — U071 COVID-19: Secondary | ICD-10-CM | POA: Insufficient documentation

## 2020-03-24 DIAGNOSIS — R509 Fever, unspecified: Secondary | ICD-10-CM | POA: Diagnosis present

## 2020-03-24 DIAGNOSIS — Z20822 Contact with and (suspected) exposure to covid-19: Secondary | ICD-10-CM

## 2020-03-24 LAB — SARS CORONAVIRUS 2 (TAT 6-24 HRS): SARS Coronavirus 2: POSITIVE — AB

## 2020-03-24 LAB — GROUP A STREP BY PCR: Group A Strep by PCR: NOT DETECTED

## 2020-03-24 MED ORDER — GUAIFENESIN 100 MG/5ML PO SYRP
100.0000 mg | ORAL_SOLUTION | ORAL | 0 refills | Status: DC | PRN
Start: 2020-03-24 — End: 2020-05-26

## 2020-03-24 MED ORDER — NAPROXEN 500 MG PO TABS: 500.0000 mg | ORAL_TABLET | Freq: Two times a day (BID) | ORAL | 0 refills | Status: DC

## 2020-03-24 NOTE — Discharge Instructions (Addendum)
Your strep test was negative.  Your Covid test was pending at discharge.  Your results will be on MyChart.  Make sure to take Tylenol, ibuprofen for any pain or fever.  Make sure to rest and drink plenty of fluids.  You will need to isolate at home for 1 week if positive for Covid.  I have written a work note for this.

## 2020-03-24 NOTE — ED Provider Notes (Signed)
[facility] EMERGENCY DEPARTMENT Provider Note   CSN: [id number] Arrival date & time: 03/24/20  6811     History Chief Complaint  Patient presents with  . Fever  . Sore Throat    Lisa Herrera is a 24 y.o. female with no significant past medical history who presents for evaluation of fever.  Patient states symptoms began on Monday, 3 days PTA.  She has had sore throat, congestion, rhinorrhea, nonproductive cough.  Not been taking any thing for her symptoms.  Feels like she has a "scratchy" throat when she goes to eat however has been able to tolerate p.o. intake.  She is overall decreased appetite.  She is not vaccinated against Covid.  She denies any known Covid exposures.  No sick contacts.  Denies headache, lightheadedness, dizziness, drooling, dysphagia, trismus, neck pain, neck stiffness, chest pain, shortness of breath, hemoptysis, abdominal pain, diarrhea, dysuria, lateral leg swelling, redness or warmth.  Denies additional aggravating or alleviating factors.  No recent surgery, mobilization, leg swelling.  No prior history of clotting disorders, PE or DVT.  History obtained from patient and past medical records.  Declines medical spanish interpreter.  HPI     Past Medical History:  Diagnosis Date  . Alpha thalassemia silent carrier 05/29/2019  . Gallstones   . Hepatic steatosis 02/04/2020  . Migraine   . Umbilical hernia without obstruction and without gangrene 02/04/2020    Patient Active Problem List   Diagnosis Date Noted  . Diarrhea 02/22/2020  . Mucus in stool 02/22/2020  . Left sided abdominal pain 02/22/2020  . Hepatic steatosis 02/04/2020  . Umbilical hernia without obstruction and without gangrene 02/04/2020  . Postpartum care following cesarean delivery 12/11/2019  . Cesarean delivery delivered 11/17/2019  . History of bilateral tubal ligation 11/17/2019  . Axillary pain, left 11/04/2019  . Sleep apnea 10/28/2019  . BMI 50.0-59.9, adult (HCC)  10/28/2019  . Language barrier 09/01/2019  . Gastroesophageal reflux disease with esophagitis without hemorrhage 05/12/2019  . Obesity affecting pregnancy in third trimester, antepartum 05/12/2019  . Hx of cholelithiasis 05/12/2019  . Supervision of other normal pregnancy, antepartum 04/08/2019  . S/P cesarean section for arrest of descent 01/13/2016  . Maternal varicella, non-immune 01/12/2016    Past Surgical History:  Procedure Laterality Date  . CESAREAN SECTION N/A 01/14/2016   Procedure: CESAREAN SECTION;  Surgeon: Tereso Newcomer, MD;  Location: [facility];  Service: Obstetrics;  Laterality: N/A;  . CESAREAN SECTION N/A 11/17/2019   Procedure: CESAREAN SECTION;  Surgeon: Levie Heritage, DO;  Location: [facility] LD ORS;  Service: Obstetrics;  Laterality: N/A;  . CHOLECYSTECTOMY    . TUBAL LIGATION Bilateral 11/17/2019   Procedure: BILATERAL TUBAL LIGATION;  Surgeon: Levie Heritage, DO;  Location: [facility] LD ORS;  Service: Obstetrics;  Laterality: Bilateral;     OB History    Gravida  2   Para  2   Term  2   Preterm      AB      Living  2     SAB      IAB      Ectopic      Multiple  0   Live Births  2        Obstetric Comments  C/S due to breech baby         Family History  Problem Relation Age of Onset  . Breast cancer Maternal Aunt        50's  . Colon cancer  Neg Hx   . Stomach cancer Neg Hx   . Esophageal cancer Neg Hx     Social History   Tobacco Use  . Smoking status: Never Smoker  . Smokeless tobacco: Never Used  Vaping Use  . Vaping Use: Never used  Substance Use Topics  . Alcohol use: No    Alcohol/week: 0.0 standard drinks  . Drug use: No    Home Medications Prior to Admission medications   Medication Sig Start Date End Date Taking? Authorizing Provider  guaifenesin (ROBITUSSIN) 100 MG/5ML syrup Take 5-10 mLs (100-200 mg total) by mouth every 4 (four) hours as needed for cough. 03/24/20  Yes Neilani Duffee A, PA-C  naproxen  (NAPROSYN) 500 MG tablet Take 1 tablet (500 mg total) by mouth 2 (two) times daily. 03/24/20  Yes Jream Broyles A, PA-C  omeprazole (PRILOSEC) 40 MG capsule Take 1 capsule (40 mg total) by mouth daily before breakfast. 02/16/20  Yes Iva Boop, MD  dicyclomine (BENTYL) 20 MG tablet Take 1 tablet (20 mg total) by mouth 3 (three) times daily before meals. 03/02/20   Iva Boop, MD  ondansetron (ZOFRAN ODT) 4 MG disintegrating tablet Take 1 tablet (4 mg total) by mouth every 8 (eight) hours as needed. Patient not taking: No sig reported 02/26/20   Long, Arlyss Repress, MD  Prenatal Vit-Fe Fumarate-FA (PRENATAL MULTIVITAMIN) TABS tablet Take 1 tablet by mouth daily.     [provider]    Allergies    Patient has no known allergies.  Review of Systems   Review of Systems  Constitutional: Positive for appetite change, chills and fever.  HENT: Positive for congestion, postnasal drip, rhinorrhea and sore throat. Negative for ear pain, sinus pressure, sinus pain, sneezing, trouble swallowing and voice change.   Eyes: Negative for pain and visual disturbance.  Respiratory: Positive for cough. Negative for shortness of breath.   Cardiovascular: Negative.  Negative for chest pain and palpitations.  Gastrointestinal: Negative.  Negative for abdominal pain and vomiting.  Genitourinary: Negative.  Negative for dysuria and hematuria.  Musculoskeletal: Negative.  Negative for arthralgias and back pain.  Skin: Negative.  Negative for color change and rash.  Neurological: Negative.  Negative for seizures and syncope.  All other systems reviewed and are negative.   Physical Exam Updated Vital Signs BP (!) 142/86 (BP Location: Left Arm)   Pulse (!) 114   Temp 99.3 F (37.4 C) (Oral)   Resp 18   Ht 5\' 1"  (1.549 m)   Wt 113.4 kg   LMP 03/14/2020   SpO2 98%   BMI 47.24 kg/m   Physical Exam Vitals and nursing note reviewed.  Constitutional:      General: She is not in acute  distress.    Appearance: She is not ill-appearing, toxic-appearing or diaphoretic.  HENT:     Head: Normocephalic and atraumatic.     Jaw: There is normal jaw occlusion.     Right Ear: Tympanic membrane, ear canal and external ear normal. There is no impacted cerumen. No hemotympanum. Tympanic membrane is not injected, scarred, perforated, erythematous, retracted or bulging.     Left Ear: Tympanic membrane, ear canal and external ear normal. There is no impacted cerumen. No hemotympanum. Tympanic membrane is not injected, scarred, perforated, erythematous, retracted or bulging.     Ears:     Comments: No Mastoid tenderness.    Nose:     Comments: Clear rhinorrhea and congestion to bilateral nares.  No sinus tenderness.  Mouth/Throat:     Pharynx: Oropharynx is clear. Uvula midline.     Tonsils: No tonsillar exudate or tonsillar abscesses. 1+ on the right. 1+ on the left.     Comments: Posterior oropharynx clear.  Mucous membranes moist.  Tonsils without extubate however erythematous bilaterally, 1+ bilaterally.Marland Kitchen.  Uvula midline without deviation.  No evidence of PTA or RPA.  No drooling, dysphasia or trismus.  Phonation normal. Neck:     Trachea: Trachea and phonation normal.     Meningeal: Brudzinski's sign and Kernig's sign absent.     Comments: No Neck stiffness or neck rigidity.  No meningismus.  No cervical lymphadenopathy. Cardiovascular:     Comments: No murmurs rubs or gallops. Pulmonary:     Comments: Clear to auscultation bilaterally without wheeze, rhonchi or rales.  No accessory muscle usage.  Able speak in full sentences. Abdominal:     Comments: Soft, nontender without rebound or guarding.  No CVA tenderness.  Musculoskeletal:     Comments: Moves all 4 extremities without difficulty.  Lower extremities without edema, erythema or warmth.  Skin:    Comments: Brisk capillary refill.  No rashes or lesions.  Neurological:     Mental Status: She is alert.     Comments:  Ambulatory in department without difficulty.  Cranial nerves II through XII grossly intact.  No facial droop.  No aphasia.     ED Results / Procedures / Treatments   Labs (all labs ordered are listed, but only abnormal results are displayed) Labs Reviewed  GROUP A STREP BY PCR  SARS CORONAVIRUS 2 (TAT 6-24 HRS)    EKG None  Radiology No results found.  Procedures Procedures (including critical care time)  Medications Ordered in ED Medications - No data to display  ED Course  I have reviewed the triage vital signs and the nursing notes.  Pertinent labs & imaging results that were available during my care of the patient were reviewed by me and considered in my medical decision making (see chart for details).  24 year old presents for evaluation of upper respiratory complaints.  She is not vaccinated against COVID.  On arrival she is afebrile, nonseptic, not ill-appearing.  Initially mildly tachycardic however improved once back in her room.  She is tolerating p.o. intake at home without difficulty.  She has a nonfocal neuro exam without deficits.  Does have clear congestion and rhinorrhea.  Posterior oropharynx erythematous with 1+ tonsils bilaterally without exudate.  No evidence of PTA or RPA.  Her uvula is midline without deviation.  Her heart and lungs are clear.  Her abdomen is soft, nontender.  She denies any chest pain, shortness of breath, hemoptysis.  No unilateral leg swelling, redness or warmth.  No clinical evidence of DVT.  Low suspicion for PE.  We will plan on Covid and strep test.  Strep test negative  Covid pending at discharge.  Patient states she has access to MyChart.  She will check these results when they return.  Patient likely with viral infection.  I have discussed return precautions with patient.  Patient voiced understanding and is agreeable for follow-up.  The patient has been appropriately medically screened and/or stabilized in the ED. I have low  suspicion for any other emergent medical condition which would require further screening, evaluation or treatment in the ED or require inpatient management.  Patient is hemodynamically stable and in no acute distress.  Patient able to ambulate in department prior to ED.  Evaluation does not show acute pathology  that would require ongoing or additional emergent interventions while in the emergency department or further inpatient treatment.  I have discussed the diagnosis with the patient and answered all questions.  Pain is been managed while in the emergency department and patient has no further complaints prior to discharge.  Patient is comfortable with plan discussed in room and is stable for discharge at this time.  I have discussed strict return precautions for returning to the emergency department.  Patient was encouraged to follow-up with PCP/specialist refer to at discharge.    MDM Rules/Calculators/A&P                          Tanza Pellot was evaluated in Emergency Department on 03/24/2020 for the symptoms described in the history of present illness. She was evaluated in the context of the global COVID-19 pandemic, which necessitated consideration that the patient might be at risk for infection with the SARS-CoV-2 virus that causes COVID-19. Institutional protocols and algorithms that pertain to the evaluation of patients at risk for COVID-19 are in a state of rapid change based on information released by regulatory bodies including the CDC and federal and state organizations. These policies and algorithms were followed during the patient's care in the ED. Final Clinical Impression(s) / ED Diagnoses Final diagnoses:  Person under investigation for COVID-19    Rx / DC Orders ED Discharge Orders         Ordered    naproxen (NAPROSYN) 500 MG tablet  2 times daily        03/24/20 1152    guaifenesin (ROBITUSSIN) 100 MG/5ML syrup  Every 4 hours PRN        03/24/20 1152            Kirsti Mcalpine A, PA-C 03/24/20 1153    Vanetta Mulders, MD 04/03/20 0003

## 2020-03-24 NOTE — ED Triage Notes (Signed)
Pt reports sore throat, fever, cough, runny nose, and congestion.

## 2020-03-25 ENCOUNTER — Other Ambulatory Visit: Payer: Self-pay | Admitting: Adult Health

## 2020-03-25 ENCOUNTER — Ambulatory Visit (HOSPITAL_COMMUNITY)
Admission: RE | Admit: 2020-03-25 | Discharge: 2020-03-25 | Disposition: A | Discharge status: Home / Self Care | Payer: Medicaid Other | Source: Ambulatory Visit | Attending: Pulmonary Disease | Admitting: Pulmonary Disease

## 2020-03-25 ENCOUNTER — Other Ambulatory Visit: Payer: Self-pay | Admitting: Physician Assistant

## 2020-03-25 DIAGNOSIS — U071 COVID-19: Secondary | ICD-10-CM | POA: Insufficient documentation

## 2020-03-25 DIAGNOSIS — Z6841 Body Mass Index (BMI) 40.0 and over, adult: Secondary | ICD-10-CM

## 2020-03-25 MED ORDER — DIPHENHYDRAMINE HCL 50 MG/ML IJ SOLN: 50.0000 mg | Freq: Once | INTRAMUSCULAR | Status: DC | PRN

## 2020-03-25 MED ORDER — SODIUM CHLORIDE 0.9 % IV SOLN: INTRAVENOUS | Status: DC | PRN

## 2020-03-25 MED ORDER — SOTROVIMAB 500 MG/8ML IV SOLN: 500.0000 mg | Freq: Once | INTRAVENOUS | Status: DC

## 2020-03-25 MED ORDER — FAMOTIDINE IN NACL 20-0.9 MG/50ML-% IV SOLN: 20.0000 mg | Freq: Once | INTRAVENOUS | Status: DC | PRN

## 2020-03-25 MED ORDER — EPINEPHRINE 0.3 MG/0.3ML IJ SOAJ: 0.3000 mg | Freq: Once | INTRAMUSCULAR | Status: DC | PRN

## 2020-03-25 MED ORDER — ALBUTEROL SULFATE HFA 108 (90 BASE) MCG/ACT IN AERS: 2.0000 | INHALATION_SPRAY | Freq: Once | RESPIRATORY_TRACT | Status: DC | PRN

## 2020-03-25 MED ORDER — SODIUM CHLORIDE 0.9 % IV SOLN: Freq: Once | INTRAVENOUS | Status: AC

## 2020-03-25 MED ORDER — METHYLPREDNISOLONE SODIUM SUCC 125 MG IJ SOLR: 125.0000 mg | Freq: Once | INTRAMUSCULAR | Status: DC | PRN

## 2020-03-25 NOTE — Progress Notes (Signed)
I connected by phone with Lisa Herrera on 03/25/2020 at 10:54 AM to discuss the potential use of a new treatment for mild to moderate COVID-19 viral infection in non-hospitalized patients.  This patient is a 24 y.o. female that meets the FDA criteria for Emergency Use Authorization of COVID monoclonal antibody casirivimab/imdevimab, bamlanivimab/etesevimab, or sotrovimab.  Has a (+) direct SARS-CoV-2 viral test result  Has mild or moderate COVID-19   Is NOT hospitalized due to COVID-19  Is within 10 days of symptom onset  Has at least one of the high risk factor(s) for progression to severe COVID-19 and/or hospitalization as defined in EUA.  Specific high risk criteria : BMI > 25 and Other high risk medical condition per CDC:  Hispanic and unvaccinated    I have spoken and communicated the following to the patient or parent/caregiver regarding COVID monoclonal antibody treatment:  1. FDA has authorized the emergency use for the treatment of mild to moderate COVID-19 in adults and pediatric patients with positive results of direct SARS-CoV-2 viral testing who are 32 years of age and older weighing at least 40 kg, and who are at high risk for progressing to severe COVID-19 and/or hospitalization.  2. The significant known and potential risks and benefits of COVID monoclonal antibody, and the extent to which such potential risks and benefits are unknown.  3. Information on available alternative treatments and the risks and benefits of those alternatives, including clinical trials.  4. Patients treated with COVID monoclonal antibody should continue to self-isolate and use infection control measures (e.g., wear mask, isolate, social distance, avoid sharing personal items, clean and disinfect "high touch" surfaces, and frequent handwashing) according to CDC guidelines.   5. The patient or parent/caregiver has the option to accept or refuse COVID monoclonal antibody treatment.  After reviewing  this information with the patient, the patient has agreed to receive one of the available covid 19 monoclonal antibodies and will be provided an appropriate fact sheet prior to infusion.   [place], [place] 03/25/2020 10:54 AM

## 2020-03-25 NOTE — Progress Notes (Signed)
Patient reviewed Fact Sheet for Patients, Parents, and Caregivers for Emergency Use Authorization (EUA) of REGEN-COV for the Treatment of Coronavirus. Patient also reviewed and is agreeable to the estimated cost of treatment. Patient is agreeable to proceed.    

## 2020-03-25 NOTE — Progress Notes (Signed)
  Diagnosis: COVID-19  Physician: Patrick Wright, MD  Procedure: Covid Infusion Clinic Med: casirivimab\imdevimab infusion - Provided patient with casirivimab\imdevimab fact sheet for patients, parents and caregivers prior to infusion.  Complications: No immediate complications noted.  Discharge: Discharged home   Lisa Herrera [name] 03/25/2020  

## 2020-03-25 NOTE — Discharge Instructions (Signed)
10 Things You Can Do to Manage Your COVID-19 Symptoms at Home If you have possible or confirmed COVID-19: 1. Stay home from work and school. And stay away from other public places. If you must go out, avoid using any kind of public transportation, ridesharing, or taxis. 2. Monitor your symptoms carefully. If your symptoms get worse, call your healthcare provider immediately. 3. Get rest and stay hydrated. 4. If you have a medical appointment, call the healthcare provider ahead of time and tell them that you have or may have COVID-19. 5. For medical emergencies, call 911 and notify the dispatch personnel that you have or may have COVID-19. 6. Cover your cough and sneezes with a tissue or use the inside of your elbow. 7. Wash your hands often with soap and water for at least 20 seconds or clean your hands with an alcohol-based hand sanitizer that contains at least 60% alcohol. 8. As much as possible, stay in a specific room and away from other people in your home. Also, you should use a separate bathroom, if available. If you need to be around other people in or outside of the home, wear a mask. 9. Avoid sharing personal items with other people in your household, like dishes, towels, and bedding. 10. Clean all surfaces that are touched often, like counters, tabletops, and doorknobs. Use household cleaning sprays or wipes according to the label instructions. cdc.gov/coronavirus 09/24/2018 This information is not intended to replace advice given to you by your health care provider. Make sure you discuss any questions you have with your health care provider. Document Revised: 02/26/2019 Document Reviewed: 02/26/2019 Elsevier Patient Education  2020 Elsevier Inc. What types of side effects do monoclonal antibody drugs cause?  Common side effects  In general, the more common side effects caused by monoclonal antibody drugs include: . Allergic reactions, such as hives or itching . Flu-like signs and  symptoms, including chills, fatigue, fever, and muscle aches and pains . Nausea, vomiting . Diarrhea . Skin rashes . Low blood pressure   The CDC is recommending patients who receive monoclonal antibody treatments wait at least 90 days before being vaccinated.  Currently, there are no data on the safety and efficacy of mRNA COVID-19 vaccines in persons who received monoclonal antibodies or convalescent plasma as part of COVID-19 treatment. Based on the estimated half-life of such therapies as well as evidence suggesting that reinfection is uncommon in the 90 days after initial infection, vaccination should be deferred for at least 90 days, as a precautionary measure until additional information becomes available, to avoid interference of the antibody treatment with vaccine-induced immune responses. If you have any questions or concerns after the infusion please call the Advanced Practice Provider on call at [phone]. This number is ONLY intended for your use regarding questions or concerns about the infusion post-treatment side-effects.  Please do not provide this number to others for use. For return to work notes please contact your primary care provider.   If someone you know is interested in receiving treatment please have them call the COVID hotline at [phone].   

## 2020-04-01 ENCOUNTER — Ambulatory Visit: Payer: Medicaid Other | Admitting: Gastroenterology

## 2020-04-13 ENCOUNTER — Ambulatory Visit: Payer: Medicaid Other | Admitting: Dietician

## 2020-04-26 ENCOUNTER — Ambulatory Visit: Payer: Medicaid Other | Admitting: Registered"

## 2020-05-25 ENCOUNTER — Ambulatory Visit (HOSPITAL_COMMUNITY)
Admission: EM | Admit: 2020-05-25 | Discharge: 2020-05-26 | Disposition: A | Discharge status: Other Acute Inpatient Hospital | Payer: Medicaid Other | Attending: Nurse Practitioner | Admitting: Nurse Practitioner

## 2020-05-25 ENCOUNTER — Other Ambulatory Visit: Payer: Self-pay

## 2020-05-25 DIAGNOSIS — F331 Major depressive disorder, recurrent, moderate: Secondary | ICD-10-CM | POA: Insufficient documentation

## 2020-05-25 DIAGNOSIS — Z20822 Contact with and (suspected) exposure to covid-19: Secondary | ICD-10-CM | POA: Insufficient documentation

## 2020-05-25 DIAGNOSIS — R45851 Suicidal ideations: Secondary | ICD-10-CM | POA: Diagnosis not present

## 2020-05-25 DIAGNOSIS — F322 Major depressive disorder, single episode, severe without psychotic features: Secondary | ICD-10-CM | POA: Insufficient documentation

## 2020-05-25 DIAGNOSIS — F332 Major depressive disorder, recurrent severe without psychotic features: Secondary | ICD-10-CM

## 2020-05-25 LAB — POCT URINE DRUG SCREEN - MANUAL ENTRY (I-SCREEN)
POC Amphetamine UR: NOT DETECTED
POC Buprenorphine (BUP): NOT DETECTED
POC Cocaine UR: NOT DETECTED
POC Marijuana UR: NOT DETECTED
POC Methadone UR: NOT DETECTED
POC Methamphetamine UR: NOT DETECTED
POC Morphine: NOT DETECTED
POC Oxazepam (BZO): NOT DETECTED
POC Oxycodone UR: NOT DETECTED
POC Secobarbital (BAR): NOT DETECTED

## 2020-05-25 LAB — POC SARS CORONAVIRUS 2 AG: SARS Coronavirus 2 Ag: NEGATIVE

## 2020-05-25 LAB — POCT PREGNANCY, URINE: Preg Test, Ur: NEGATIVE

## 2020-05-25 LAB — POC SARS CORONAVIRUS 2 AG -  ED: SARS Coronavirus 2 Ag: NEGATIVE

## 2020-05-25 MED ORDER — ALUM & MAG HYDROXIDE-SIMETH 200-200-20 MG/5ML PO SUSP
30.0000 mL | ORAL | Status: DC | PRN
  Administered 2020-05-26: 30 mL via ORAL
  Filled 2020-05-25: qty 30

## 2020-05-25 MED ORDER — TRAZODONE HCL 50 MG PO TABS: 50.0000 mg | ORAL_TABLET | Freq: Every evening | ORAL | Status: DC | PRN

## 2020-05-25 MED ORDER — ACETAMINOPHEN 325 MG PO TABS: 650.0000 mg | ORAL_TABLET | Freq: Four times a day (QID) | ORAL | Status: DC | PRN

## 2020-05-25 MED ORDER — HYDROXYZINE HCL 25 MG PO TABS: 25.0000 mg | ORAL_TABLET | Freq: Three times a day (TID) | ORAL | Status: DC | PRN

## 2020-05-25 MED ORDER — MAGNESIUM HYDROXIDE 400 MG/5ML PO SUSP: 30.0000 mL | Freq: Every day | ORAL | Status: DC | PRN

## 2020-05-25 NOTE — BH Assessment (Signed)
Disposition:  Per Nira ConnJason Berry, NP pt to be observed overnight [facility] with provider reassessment in the AM   Comprehensive Clinical Assessment (CCA) Note  05/25/2020 Lisa JacobsonYvette Herrera [id number]  Chief Complaint:  Chief Complaint  Patient presents with  . 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 [facility] interpreter services used throughout assessment (Spanish). Pt reports that she has had suicidal ideation with vague plans (pt has been thinking about different ways) with unclear intent on follow through. Pt reports that she has been feeling a lot of depression and anxiety after the birth of her baby 6 months ago. Pt reports that she has a 27yo and did not have similar symptoms after the birth of her first child. Pt denies any HI, AVH, or substance use. Pt endorses paranoid ideation. Pt states that she was under the care of a psychiatrist (televisit) but did not continue to follow up with them. Pt reports that her PCP did give her pills for depression (post partum depression) but she felt that the medication was not helpful and discontinued. Pt presents with a very flat affect. Pt states that she feels that she is a danger to herself at time of assessment.  Lisa Herrera, MSW, LCSW Outpatient Therapist/Triage Specialist  Disposition:  Per Nira ConnJason Berry, NP pt to be observed overnight [facility] with provider reassessment in the AM   CCA Screening, Triage and Referral (STR)  Patient Reported Information How did you hear about us? No data recorded [facility] name[facility]  Referral phone number: No data recorded  Whom do you see for routine medical problems? No data recorded Practice/Facility Name: No data recorded Practice/Facility Phone Number: No data recorded Name of Contact: No data recorded Contact Number: No  data recorded Contact Fax Number: No data recorded Prescriber Name: No data recorded Prescriber Address (if known): No data recorded  What Is the Reason for Your Visit/Call Today? depression, suicidal ideation  How Long Has This Been Causing You Problems? <Week  What Do You Feel Would Help You the Most Today? Assessment Only; Therapy; Medication   Have You Recently Been in Any Inpatient Treatment (Hospital/Detox/Crisis Center/28-Day Program)? No  Name/Location of Program/Hospital:No data recorded How Long Were You There? No data recorded When Were You Discharged? No data recorded  [facility]? [facility]  [facility]   Have You Recently Had Any Thoughts About Hurting Yourself? Yes  Are You Planning to Commit Suicide/Harm Yourself At This time? Yes   Have you Recently Had Thoughts About Hurting Someone Karolee Ohslse? No  Explanation: No data recorded  Have You Used Any Alcohol or Drugs in the Past 24 Hours? No  How Long Ago Did You Use Drugs or Alcohol? No data recorded What Did You Use and How Much? No data recorded  Do You Currently Have a Therapist/Psychiatrist? Yes  Name of Therapist/Psychiatrist: Pt cannot remember name of psychiatrist   Have You Been Recently Discharged From Any Office Practice or Programs? No  Explanation of Discharge From Practice/Program: No data recorded    CCA Screening Triage Referral Assessment Type of Contact: Face-to-Face  Is this Initial or Reassessment? No data recorded Date Telepsych consult ordered in [facility]:  No data recorded Time Telepsych consult ordered in [facility]:  No  data recorded  Patient Reported Information Reviewed? Yes  Patient Left Without Being Seen? No data recorded Reason for Not Completing Assessment: No data recorded  Collateral Involvement: husband registered pt and stated that she was having suicidal ideation and has post partum depression   Does  Patient Have a Court Appointed Legal Guardian? No data recorded Name and Contact of Legal Guardian: No data recorded If Minor and Not Living with Parent(s), Who has Custody? No data recorded Is CPS involved or ever been involved? Never  Is APS involved or ever been involved? Never   Patient Determined To Be At Risk for Harm To Self or Others Based on Review of Patient Reported Information or Presenting Complaint? Yes, for Self-Harm  Method: No data recorded Availability of Means: No data recorded Intent: No data recorded Notification Required: No data recorded Additional Information for Danger to Others Potential: No data recorded Additional Comments for Danger to Others Potential: No data recorded Are There Guns or Other Weapons in Your Home? No data recorded Types of Guns/Weapons: No data recorded Are These Weapons Safely Secured?                            No data recorded Who Could Verify You Are Able To Have These Secured: No data recorded Do You Have any Outstanding Charges, Pending Court Dates, Parole/Probation? No data recorded Contacted To Inform of Risk of Harm To Self or Others: No data recorded  Location of Assessment: [facility]   Does Patient Present under Involuntary Commitment? No  IVC Papers Initial File Date: No data recorded  [place] of Residence: [place]   Patient Currently Receiving the Following Services: Medication Management (PCP managing meds)   Determination of Need: Emergent (2 hours)   Options For Referral: [facility] (overnight observation)     CCA Biopsychosocial Intake/Chief Complaint:  Lisa Herrera is a 25yo female presenting as a walk in to [facility] for evaluation of suicidal thoughts.  WALL E interpreter services used throughout assessment (Spanish).  Pt reports that she has had suicidal ideation with vague plans (pt has been thinking about different ways) with unclear intent on follow through. Pt reports that she has been  feeling a lot of depression and anxiety after the birth of her baby 6 months ago. Pt reports that she has a 27yo and did not have similar symptoms after the birth of her first child. Pt denies any HI, AVH, or substance use. Pt endorses paranoid ideation.     Pt states that she was under the care of a psychiatrist (televisit) but did not continue to follow up with them. Pt reports that her PCP did give her pills for depression (post partum depression) but she felt that the medication was not helpful and discontinued.     Pt presents with a very flat affect.      Pt states that she feels that she is a danger to herself at time of assessment.  Current Symptoms/Problems: depression, suicidal ideation   Patient Reported Schizophrenia/Schizoaffective Diagnosis in Past: No   Strengths: good family and community supports  Preferences: No data recorded Abilities: No data recorded  Type of Services Patient Feels are Needed: No data recorded  Initial Clinical Notes/Concerns: No data recorded  Mental Health Symptoms Depression:  Change in energy/activity; Tearfulness; Difficulty Concentrating; Fatigue; Hopelessness; Increase/decrease in appetite; Irritability; Sleep (too much or little) (insomnia)   Duration of Depressive symptoms: Greater than two  weeks   Mania:  Racing thoughts   Anxiety:   Difficulty concentrating; Sleep; Tension; Worrying   Psychosis:  None   Duration of Psychotic symptoms: No data recorded  Trauma:  None   Obsessions:  None   Compulsions:  None   Inattention:  None   Hyperactivity/Impulsivity:  N/A   Oppositional/Defiant Behaviors:  None   Emotional Irregularity:  None   Other Mood/Personality Symptoms:  No data recorded   Mental Status Exam Appearance and self-care  Stature:  Average   Weight:  Overweight   Clothing:  Neat/clean   Grooming:  Normal   Cosmetic use:  None   Posture/gait:  Slumped   Motor activity:  Not Remarkable   Sensorium   Attention:  Normal   Concentration:  Normal   Orientation:  X5   Recall/memory:  Normal   Affect and Mood  Affect:  Anxious; Depressed; Flat   Mood:  Depressed; Dysphoric   Relating  Eye contact:  Normal   Facial expression:  Depressed; Sad   Attitude toward examiner:  Cooperative   Thought and Language  Speech flow: Soft   Thought content:  Appropriate to Mood and Circumstances   Preoccupation:  Suicide; Guilt ("I feel like I am a burden for everyone")   Hallucinations:  No data recorded  Organization:  No data recorded  [employer] of Knowledge:  Good   Intelligence:  Average   Abstraction:  Normal   Judgement:  Dangerous   Reality Testing:  Variable   Insight:  Gaps   Decision Making:  Impulsive   Social Functioning  Social Maturity:  Impulsive   Social Judgement:  Normal   Stress  Stressors:  Other (Comment) (PPD)   Coping Ability:  Overwhelmed   Skill Deficits:  Self-control   Supports:  Church; Family     Religion: Religion/Spirituality Are You A Religious Person?: Yes  Leisure/Recreation: Leisure / Recreation Do You Have Hobbies?: Yes Leisure and Hobbies: spending time with family  Exercise/Diet: Exercise/Diet Do You Exercise?: No Have You Gained or Lost A Significant Amount of Weight in the Past Six Months?: Yes-Lost Number of Pounds Lost?: 15 Do You Follow a Special Diet?: No Do You Have Any Trouble Sleeping?: Yes Explanation of Sleeping Difficulties: insomnia   CCA Employment/Education Employment/Work Situation: Employment / Work Situation Has patient ever been in the [employer]?: No  Education:     CCA Family/Childhood History Family and Relationship History: Family history Marital status: Married  Childhood History:  Childhood History Additional childhood history information: Brother attempted suicide--has history of schizophrenia Does patient have siblings?: Yes Description of patient's current  relationship with siblings: several siblings and close to sister.  Child/Adolescent Assessment:     CCA Substance Use Alcohol/Drug Use: Alcohol / Drug Use Pain Medications: see MAR Prescriptions: see MAR Over the Counter: see MAR History of alcohol / drug use?: No history of alcohol / drug abuse      ASAM's:  Six Dimensions of Multidimensional Assessment  Dimension 1:  Acute Intoxication and/or Withdrawal Potential:      Dimension 2:  Biomedical Conditions and Complications:      Dimension 3:  Emotional, Behavioral, or Cognitive Conditions and Complications:     Dimension 4:  Readiness to Change:     Dimension 5:  Relapse, Continued use, or Continued Problem Potential:     Dimension 6:  Recovery/Living Environment:     ASAM Severity Score:    ASAM Recommended Level of Treatment:  Substance use Disorder (SUD)  none  Recommendations for Services/Supports/Treatments:    DSM5 Diagnoses: Patient Active Problem List   Diagnosis Date Noted  . Diarrhea 02/22/2020  . Mucus in stool 02/22/2020  . Left sided abdominal pain 02/22/2020  . Hepatic steatosis 02/04/2020  . Umbilical hernia without obstruction and without gangrene 02/04/2020  . Postpartum care following cesarean delivery 12/11/2019  . Cesarean delivery delivered 11/17/2019  . History of bilateral tubal ligation 11/17/2019  . Axillary pain, left 11/04/2019  . Sleep apnea 10/28/2019  . BMI 50.0-59.9, adult (HCC) 10/28/2019  . Language barrier 09/01/2019  . Gastroesophageal reflux disease with esophagitis without hemorrhage 05/12/2019  . Obesity affecting pregnancy in third trimester, antepartum 05/12/2019  . Hx of cholelithiasis 05/12/2019  . Supervision of other normal pregnancy, antepartum 04/08/2019  . S/P cesarean section for arrest of descent 01/13/2016  . Maternal varicella, non-immune 01/12/2016    Referrals to Alternative Service(s): Referred to Alternative Service(s):   Place:   Date:   Time:     Referred to Alternative Service(s):   Place:   Date:   Time:    Referred to Alternative Service(s):   Place:   Date:   Time:    Referred to Alternative Service(s):   Place:   Date:   Time:     Ernest Haber Deon Ivey, LCSW

## 2020-05-25 NOTE — ED Notes (Signed)
Belongings in locker 31 

## 2020-05-25 NOTE — ED Provider Notes (Signed)
Behavioral Health Admission H&P [facility] & OBS)  Date: 05/25/20 Patient Name: Lisa Herrera MRN: [id number] Chief Complaint:  Chief Complaint  Patient presents with  . Urgent Emergent Eval   Chief Complaint/Presenting Problem: Lisa Herrera is a 25yo female presenting as a walk in to [facility] for evaluation of suicidal thoughts.  [employer] interpreter services used throughout assessment (Spanish).  Pt reports that she has had suicidal ideation with vague plans (pt has been thinking about different ways) with unclear intent on follow through. Pt reports that she has been feeling a lot of depression and anxiety after the birth of her baby 6 months ago. Pt reports that she has a 27yo and did not have similar symptoms after the birth of her first child. Pt denies any HI, AVH, or substance use. Pt endorses paranoid ideation.     Pt states that she was under the care of a psychiatrist (televisit) but did not continue to follow up with them. Pt reports that her PCP did give her pills for depression (post partum depression) but she felt that the medication was not helpful and discontinued.     Pt presents with a very flat affect.      Pt states that she feels that she is a danger to herself at time of assessment.  Diagnoses:  Final diagnoses:  Severe major depression, single episode, without psychotic features (HCC)  Suicidal ideation    HPI: Lisa Herrera is a 25 y.o. female who presents to [facility] voluntarily due to worsening depression since the birth of her child 6 months ago. [employer] spanish interpreter services were used for the assessment. Patient reports "I feel numb like I have no emotions or feelings." She reports that she started feeling depressed after the birth of her child. She denies feeling sad after the birth of her first child. She denies a history of depression. She reports that she has been having suicidal thoughts. She denies a specific plan, but acknowledges that she has been thinking of various ways.  She denies homicidal ideations. She denies thoughts of wanting to her children. She denies auditory and visual hallucinations. No indication that she is responding to internal stimuli. She denies use of alcohol, marijuana, and other substances. UDS negative. She states that he OB/GYN prescribed medication for post-partum depression but she stopped taking it because she did not like it made her feel. She states that she saw a therapist via telehealth, but has not followed up.   On evaluation patient is alert and oriented x 4, pleasant, and cooperative. Speech is clear and coherent, decreased in volume. Eye contact is fair. She is fairly groomed.  Mood is depressed and anxious. Affect is flat. Thought process is coherent and thought content is logical. Denies auditory and visual hallucinations. No indication that patient is responding to internal stimuli. No evidence of delusional thought content. Reports suicidal thoughts without a specific plan.  Denies homicidal ideations.    PHQ 2-9:   Flowsheet Row ED from 05/25/2020 in [facility]  C-SSRS RISK CATEGORY Moderate Risk       Total Time spent with patient: 30 minutes  Musculoskeletal  Strength & Muscle Tone: within normal limits Gait & Station: normal Patient leans: N/A  Psychiatric Specialty Exam  Presentation General Appearance: Appropriate for Environment; Fairly Groomed  Eye Contact:Fair  Speech:Clear and Coherent; Normal Rate  Speech Volume:Decreased  Handedness:No data recorded  Mood and Affect  Mood:Anxious; Depressed; Hopeless; Worthless  Affect:Congruent; Depressed   Thought Process  Thought Processes:Coherent; Goal Directed  Descriptions of Associations:Intact  Orientation:Full (Time, Place and Person)  Thought Content:Logical   Hallucinations:Hallucinations: None  Ideas of Reference:None  Suicidal Thoughts:Suicidal Thoughts: Yes, Active SI Active Intent and/or Plan: Without  Plan  Homicidal Thoughts:Homicidal Thoughts: No   Sensorium  Memory:Immediate Good; Recent Good  Judgment:Impaired  Insight:Present   Executive Functions  Concentration:Fair  Attention Span:Fair  Recall:Good  Fund of Knowledge:Good  Language:Good   Psychomotor Activity  Psychomotor Activity:Psychomotor Activity: Psychomotor Retardation   Assets  Assets:Communication Skills; Desire for Improvement; Financial Resources/Insurance; Housing; Physical Health; Social Support   Sleep  Sleep:Sleep: Fair   Nutritional Assessment (For OBS and FBC admissions only) Has the patient had a weight loss or gain of 10 pounds or more in the last 3 months?: Yes Has the patient had a decrease in food intake/or appetite?: Yes Does the patient have dental problems?: No Does the patient have eating habits or behaviors that may be indicators of an eating disorder including binging or inducing vomiting?: No Has the patient recently lost weight without trying?: Yes, 2-13 lbs. Has the patient been eating poorly because of a decreased appetite?: Yes Malnutrition Screening Tool Score: 2    Physical Exam Constitutional:      General: She is not in acute distress.    Appearance: She is not ill-appearing, toxic-appearing or diaphoretic.  HENT:     Head: Normocephalic.     Right Ear: External ear normal.     Left Ear: External ear normal.  Eyes:     Conjunctiva/sclera: Conjunctivae normal.     Pupils: Pupils are equal, round, and reactive to light.  Cardiovascular:     Rate and Rhythm: Normal rate.  Pulmonary:     Effort: Pulmonary effort is normal. No respiratory distress.  Musculoskeletal:        General: Normal range of motion.  Skin:    General: Skin is warm and dry.  Neurological:     Mental Status: She is alert and oriented to person, place, and time.  Psychiatric:        Mood and Affect: Mood is anxious and depressed.        Thought Content: Thought content is not paranoid  or delusional. Thought content includes suicidal ideation. Thought content does not include homicidal ideation.    Review of Systems  Constitutional: Positive for malaise/fatigue and weight loss. Negative for chills, diaphoresis and fever.  HENT: Negative for congestion.   Respiratory: Negative for cough and shortness of breath.   Cardiovascular: Negative for chest pain and palpitations.  Gastrointestinal: Negative for diarrhea, nausea and vomiting.  Neurological: Negative for dizziness and seizures.  Psychiatric/Behavioral: Positive for depression and suicidal ideas. Negative for hallucinations, memory loss and substance abuse. The patient is nervous/anxious and has insomnia.   All other systems reviewed and are negative.   Blood pressure (!) 149/85, pulse 70, temperature 98.1 F (36.7 C), temperature source Oral, resp. rate 20, SpO2 100 %, not currently breastfeeding. There is no height or weight on file to calculate BMI.  Past Psychiatric History: Denies previous psychiatric history  Is the patient at risk to self? Yes  Has the patient been a risk to self in the past 6 months? No .    Has the patient been a risk to self within the distant past? No   Is the patient a risk to others? No   Has the patient been a risk to others in the past 6 months? No   Has the  patient been a risk to others within the distant past? No   Past Medical History:  Past Medical History:  Diagnosis Date  . Alpha thalassemia silent carrier 05/29/2019  . Gallstones   . Hepatic steatosis 02/04/2020  . Migraine   . Umbilical hernia without obstruction and without gangrene 02/04/2020    Past Surgical History:  Procedure Laterality Date  . CESAREAN SECTION N/A 01/14/2016   Procedure: CESAREAN SECTION;  Surgeon: Tereso Newcomer, MD;  Location: [facility];  Service: Obstetrics;  Laterality: N/A;  . CESAREAN SECTION N/A 11/17/2019   Procedure: CESAREAN SECTION;  Surgeon: Levie Heritage, DO;  Location:  [facility] LD ORS;  Service: Obstetrics;  Laterality: N/A;  . CHOLECYSTECTOMY    . TUBAL LIGATION Bilateral 11/17/2019   Procedure: BILATERAL TUBAL LIGATION;  Surgeon: Levie Heritage, DO;  Location: [facility] LD ORS;  Service: Obstetrics;  Laterality: Bilateral;    Family History:  Family History  Problem Relation Age of Onset  . Breast cancer Maternal Aunt        50's  . Colon cancer Neg Hx   . Stomach cancer Neg Hx   . Esophageal cancer Neg Hx     Social History:  Social History   Socioeconomic History  . Marital status: Married    Spouse name: Not on file  . Number of children: 1  . Years of education: Not on file  . Highest education level: Not on file  Occupational History  . Not on file  Tobacco Use  . Smoking status: Never Smoker  . Smokeless tobacco: Never Used  Vaping Use  . Vaping Use: Never used  Substance and Sexual Activity  . Alcohol use: No    Alcohol/week: 0.0 standard drinks  . Drug use: No  . Sexual activity: Yes    Birth control/protection: None  Other Topics Concern  . Not on file  Social History Narrative   Married, originally from [place], Spanish-speaking.   Son born October 2021 and a daughter born in 2017   No alcohol no tobacco or drug use   Social Determinants of Corporate investment banker Strain: Not on file  Food Insecurity: Not on file  Transportation Needs: Not on file  Physical Activity: Not on file  Stress: Not on file  Social Connections: Not on file  Intimate Partner Violence: Not on file    SDOH:  SDOH Screenings   Alcohol Screen: Not on file  Depression (ZOX0-9): Not on file  Financial Resource Strain: Not on file  Food Insecurity: Not on file  Housing: Not on file  Physical Activity: Not on file  Social Connections: Not on file  Stress: Not on file  Tobacco Use: Low Risk   . Smoking Tobacco Use: Never Smoker  . Smokeless Tobacco Use: Never Used  Transportation Needs: Not on file    Last Labs:  Admission on 03/24/2020,  Discharged on 03/24/2020  Component Date Value Ref Range Status  . Group A Strep by PCR 03/24/2020 NOT DETECTED  NOT DETECTED Final   Performed at [facility], [address] [place], [place] [address]  . SARS Coronavirus 2 03/24/2020 POSITIVE* NEGATIVE Final   Comment: Emailed Lori Berdik @ 2028 on 03/24/2020 by Elenor Quinones (NOTE) SARS-CoV-2 target nucleic acids are DETECTED.  The SARS-CoV-2 RNA is generally detectable in upper and lower respiratory specimens during the acute phase of infection. Positive results are indicative of the presence of SARS-CoV-2 RNA. Clinical correlation with patient history  and other diagnostic information is  necessary to determine patient infection status. Positive results do not rule out bacterial infection or co-infection with other viruses.  The expected result is Negative.  Fact Sheet for Patients: HairSlick.no  Fact Sheet for Healthcare Providers: quierodirigir.com  This test is not yet approved or cleared by the [place] FDA and  has been authorized for detection and/or diagnosis of SARS-CoV-2 by FDA under an Emergency Use Authorization (EUA). This EUA will remain  in effect (meaning this test can be used) for the duration of the COVID-19                           declaration under Section 564(b)(1) of the Act, 21 U.S.C. section 360bbb-3(b)(1), unless the authorization is terminated or revoked sooner.   Performed at [facility], [address], [place], [place] [address]   Orders Only on 02/29/2020  Component Date Value Ref Range Status  . SARS Coronavirus 2 02/29/2020 RESULT: NEGATIVE   Final   Comment: RESULT: NEGATIVESARS-CoV-2 INTERPRETATION:A NEGATIVE  test result means that SARS-CoV-2 RNA was not present in the specimen above the limit of detection of this test. This does not preclude a possible SARS-CoV-2 infection and should not be used as the  sole basis  for patient management decisions. Negative results must be combined with clinical observations, patient history, and epidemiological information. Optimum specimen types and timing for peak viral levels during infections caused by SARS-CoV-2  have not been determined. Collection of multiple specimens or types of specimens may be necessary to detect virus. Improper specimen collection and handling, sequence variability under primers/probes, or organism present below the limit of detection may  lead to false negative results. Positive and negative predictive values of testing are highly dependent on prevalence. False negative test results are more likely when prevalence of disease is high.The expected result is NEGATIVE.Fact S                          heet for  Healthcare Providers: CollegeCustoms.gl Sheet for Patients: https://poole-freeman.org/ Reference Range - Negative   Admission on 02/26/2020, Discharged on 02/26/2020  Component Date Value Ref Range Status  . Lipase 02/26/2020 22  11 - 51 U/L Final   Performed at [facility], [address] [place], [place] [address]  . Sodium 02/26/2020 136  135 - 145 mmol/L Final  . Potassium 02/26/2020 3.7  3.5 - 5.1 mmol/L Final  . Chloride 02/26/2020 104  98 - 111 mmol/L Final  . CO2 02/26/2020 24  22 - 32 mmol/L Final  . Glucose, Bld 02/26/2020 102* 70 - 99 mg/dL Final   Glucose reference range applies only to samples taken after fasting for at least 8 hours.  . BUN 02/26/2020 8  6 - 20 mg/dL Final  . Creatinine, Ser 02/26/2020 0.40* 0.44 - 1.00 mg/dL Final  . Calcium 25/49/8264 9.0  8.9 - 10.3 mg/dL Final  . Total Protein 02/26/2020 7.1  6.5 - 8.1 g/dL Final  . Albumin 15/83/0940 4.0  3.5 - 5.0 g/dL Final  . AST 76/80/8811 68* 15 - 41 U/L Final  . ALT 02/26/2020 103* 0 - 44 U/L Final  . Alkaline Phosphatase 02/26/2020 96  38 - 126 U/L Final  . Total Bilirubin 02/26/2020 0.4  0.3 - 1.2  mg/dL Final  . GFR, Estimated 02/26/2020 >60  >60 mL/min Final   Comment: (NOTE) Calculated  using the CKD-EPI Creatinine Equation (2021)   . Anion gap 02/26/2020 8  5 - 15 Final   Performed at [facility], [address] [place], [place] [address]  . WBC 02/26/2020 5.2  4.0 - 10.5 K/uL Final  . RBC 02/26/2020 4.99  3.87 - 5.11 MIL/uL Final  . Hemoglobin 02/26/2020 14.0  12.0 - 15.0 g/dL Final  . HCT 60/45/4098 43.3  36.0 - 46.0 % Final  . MCV 02/26/2020 86.8  80.0 - 100.0 fL Final  . MCH 02/26/2020 28.1  26.0 - 34.0 pg Final  . MCHC 02/26/2020 32.3  30.0 - 36.0 g/dL Final  . RDW 11/91/4782 13.1  11.5 - 15.5 % Final  . Platelets 02/26/2020 290  150 - 400 K/uL Final  . nRBC 02/26/2020 0.0  0.0 - 0.2 % Final   Performed at [facility], [address] [place], [place] [address]  . Color, Urine 02/26/2020 YELLOW  YELLOW Final  . APPearance 02/26/2020 CLEAR  CLEAR Final  . Specific Gravity, Urine 02/26/2020 <1.005* 1.005 - 1.030 Final  . pH 02/26/2020 7.0  5.0 - 8.0 Final  . Glucose, UA 02/26/2020 NEGATIVE  NEGATIVE mg/dL Final  . Hgb urine dipstick 02/26/2020 NEGATIVE  NEGATIVE Final  . Bilirubin Urine 02/26/2020 NEGATIVE  NEGATIVE Final  . Ketones, ur 02/26/2020 NEGATIVE  NEGATIVE mg/dL Final  . Protein, ur 30/86/5784 NEGATIVE  NEGATIVE mg/dL Final  . Nitrite 69/62/9528 NEGATIVE  NEGATIVE Final  . Glori Luis 02/26/2020 NEGATIVE  NEGATIVE Final   Comment: Microscopic not done on urines with negative protein, blood, leukocytes, nitrite, or glucose < 500 mg/dL. Performed at [facility], [address] [place], [place] [address]   . Preg Test, Ur 02/26/2020 NEGATIVE  NEGATIVE Final   Comment:        THE SENSITIVITY OF THIS METHODOLOGY IS >20 mIU/mL. Performed at [facility], [address] [place], [place] [address]   Orders Only on 02/05/2020  Component Date Value Ref Range Status  . SARS Coronavirus 2 02/05/2020 RESULT: NEGATIVE    Final   Comment: RESULT: NEGATIVESARS-CoV-2 INTERPRETATION:A NEGATIVE  test result means that SARS-CoV-2 RNA was not present in the specimen above the limit of detection of this test. This does not preclude a possible SARS-CoV-2 infection and should not be used as the  sole basis for patient management decisions. Negative results must be combined with clinical observations, patient history, and epidemiological information. Optimum specimen types and timing for peak viral levels during infections caused by SARS-CoV-2  have not been determined. Collection of multiple specimens or types of specimens may be necessary to detect virus. Improper specimen collection and handling, sequence variability under primers/probes, or organism present below the limit of detection may  lead to false negative results. Positive and negative predictive values of testing are highly dependent on prevalence. False negative test results are more likely when prevalence of disease is high.The expected result is NEGATIVE.Fact S                          heet for  Healthcare Providers: CollegeCustoms.gl Sheet for Patients: https://poole-freeman.org/ Reference Range - Negative   Appointment on 02/04/2020  Component Date Value Ref Range Status  . Hep A IgM 02/04/2020 NON-REACTIVE  NON-REACTI Final  . Hepatitis B Surface Ag 02/04/2020 NON-REACTIVE  NON-REACTI Final  . Hep B C IgM 02/04/2020 NON-REACTIVE  NON-REACTI Final  . Hepatitis  C Ab 02/04/2020 NON-REACTIVE  NON-REACTI Final  . SIGNAL TO CUT-OFF 02/04/2020 0.01  <1.00 Final   Comment: . HCV antibody was non-reactive. There is no laboratory  evidence of HCV infection. . In most cases, no further action is required. However, if recent HCV exposure is suspected, a test for HCV RNA (test code [address]) is suggested. . For additional information please refer to http://education.questdiagnostics.com/faq/FAQ22v1 (This link is  being provided for informational/ educational purposes only.) . Marland Kitchen For additional information, please refer to  http://education.questdiagnostics.com/faq/FAQ202  (This link is being provided for informational/ educational purposes only.) .   Admission on 01/23/2020, Discharged on 01/23/2020  Component Date Value Ref Range Status  . WBC 01/23/2020 7.6  4.0 - 10.5 K/uL Final  . RBC 01/23/2020 4.91  3.87 - 5.11 MIL/uL Final  . Hemoglobin 01/23/2020 13.5  12.0 - 15.0 g/dL Final  . HCT 60/45/4098 42.1  36.0 - 46.0 % Final  . MCV 01/23/2020 85.7  80.0 - 100.0 fL Final  . MCH 01/23/2020 27.5  26.0 - 34.0 pg Final  . MCHC 01/23/2020 32.1  30.0 - 36.0 g/dL Final  . RDW 11/91/4782 13.6  11.5 - 15.5 % Final  . Platelets 01/23/2020 265  150 - 400 K/uL Final  . nRBC 01/23/2020 0.0  0.0 - 0.2 % Final  . Neutrophils Relative % 01/23/2020 66  % Final  . Neutro Abs 01/23/2020 4.9  1.7 - 7.7 K/uL Final  . Lymphocytes Relative 01/23/2020 27  % Final  . Lymphs Abs 01/23/2020 2.1  0.7 - 4.0 K/uL Final  . Monocytes Relative 01/23/2020 6  % Final  . Monocytes Absolute 01/23/2020 0.4  0.1 - 1.0 K/uL Final  . Eosinophils Relative 01/23/2020 1  % Final  . Eosinophils Absolute 01/23/2020 0.1  0.0 - 0.5 K/uL Final  . Basophils Relative 01/23/2020 0  % Final  . Basophils Absolute 01/23/2020 0.0  0.0 - 0.1 K/uL Final  . Immature Granulocytes 01/23/2020 0  % Final  . Abs Immature Granulocytes 01/23/2020 0.02  0.00 - 0.07 K/uL Final   Performed at [facility], [address] [place], [place] [address]  . Sodium 01/23/2020 135  135 - 145 mmol/L Final  . Potassium 01/23/2020 4.0  3.5 - 5.1 mmol/L Final  . Chloride 01/23/2020 104  98 - 111 mmol/L Final  . CO2 01/23/2020 21* 22 - 32 mmol/L Final  . Glucose, Bld 01/23/2020 98  70 - 99 mg/dL Final   Glucose reference range applies only to samples taken after fasting for at least 8 hours.  . BUN 01/23/2020 11  6 - 20 mg/dL Final  . Creatinine, Ser  01/23/2020 0.58  0.44 - 1.00 mg/dL Final  . Calcium 30/86/5784 9.1  8.9 - 10.3 mg/dL Final  . Total Protein 01/23/2020 7.1  6.5 - 8.1 g/dL Final  . Albumin 69/62/9528 3.9  3.5 - 5.0 g/dL Final  . AST 41/32/4401 61* 15 - 41 U/L Final  . ALT 01/23/2020 99* 0 - 44 U/L Final  . Alkaline Phosphatase 01/23/2020 114  38 - 126 U/L Final  . Total Bilirubin 01/23/2020 0.4  0.3 - 1.2 mg/dL Final  . GFR, Estimated 01/23/2020 >60  >60 mL/min Final   Comment: (NOTE) Calculated using the CKD-EPI Creatinine Equation (2021)   . Anion gap 01/23/2020 10  5 - 15 Final   Performed at [facility], [address] [place], [place] [address]  . Lipase 01/23/2020 24  11 -  51 U/L Final   Performed at [facility], [address] [place], [place] [address]  . Preg Test, Ur 01/23/2020 NEGATIVE  NEGATIVE Final   Comment:        THE SENSITIVITY OF THIS METHODOLOGY IS >20 mIU/mL. Performed at [facility], [address] [place], [place] [address]   . Color, Urine 01/23/2020 YELLOW  YELLOW Final  . APPearance 01/23/2020 CLEAR  CLEAR Final  . Specific Gravity, Urine 01/23/2020 1.010  1.005 - 1.030 Final  . pH 01/23/2020 6.0  5.0 - 8.0 Final  . Glucose, UA 01/23/2020 NEGATIVE  NEGATIVE mg/dL Final  . Hgb urine dipstick 01/23/2020 NEGATIVE  NEGATIVE Final  . Bilirubin Urine 01/23/2020 NEGATIVE  NEGATIVE Final  . Ketones, ur 01/23/2020 NEGATIVE  NEGATIVE mg/dL Final  . Protein, ur 09/81/1914 NEGATIVE  NEGATIVE mg/dL Final  . Nitrite 78/29/5621 NEGATIVE  NEGATIVE Final  . Glori Luis 01/23/2020 NEGATIVE  NEGATIVE Final   Comment: Microscopic not done on urines with negative protein, blood, leukocytes, nitrite, or glucose < 500 mg/dL. Performed at [facility], [address], [place], [place] [address]   Admission on 01/01/2020, Discharged on 01/02/2020  Component Date Value Ref Range Status  . Color, Urine 01/01/2020 YELLOW  YELLOW Final  . APPearance 01/01/2020  HAZY* CLEAR Final  . Specific Gravity, Urine 01/01/2020 1.020  1.005 - 1.030 Final  . pH 01/01/2020 7.5  5.0 - 8.0 Final  . Glucose, UA 01/01/2020 NEGATIVE  NEGATIVE mg/dL Final  . Hgb urine dipstick 01/01/2020 NEGATIVE  NEGATIVE Final  . Bilirubin Urine 01/01/2020 NEGATIVE  NEGATIVE Final  . Ketones, ur 01/01/2020 NEGATIVE  NEGATIVE mg/dL Final  . Protein, ur 78/46/9629 NEGATIVE  NEGATIVE mg/dL Final  . Nitrite 52/84/1324 NEGATIVE  NEGATIVE Final  . Glori Luis 01/01/2020 NEGATIVE  NEGATIVE Final   Comment: Microscopic not done on urines with negative protein, blood, leukocytes, nitrite, or glucose < 500 mg/dL. Performed at [facility], [address] [place], [place] [address]   . Preg Test, Ur 01/01/2020 NEGATIVE  NEGATIVE Final   Comment:        THE SENSITIVITY OF THIS METHODOLOGY IS >20 mIU/mL. Performed at [facility], [address] [place], [place] [address]   . Lipase 01/01/2020 29  11 - 51 U/L Final   Performed at [facility], [address] [place], [place] [address]  . Sodium 01/01/2020 139  135 - 145 mmol/L Final  . Potassium 01/01/2020 4.1  3.5 - 5.1 mmol/L Final  . Chloride 01/01/2020 100  98 - 111 mmol/L Final  . CO2 01/01/2020 28  22 - 32 mmol/L Final  . Glucose, Bld 01/01/2020 91  70 - 99 mg/dL Final   Glucose reference range applies only to samples taken after fasting for at least 8 hours.  . BUN 01/01/2020 12  6 - 20 mg/dL Final  . Creatinine, Ser 01/01/2020 0.56  0.44 - 1.00 mg/dL Final  . Calcium 47/42/5956 9.1  8.9 - 10.3 mg/dL Final  . Total Protein 01/01/2020 6.8  6.5 - 8.1 g/dL Final  . Albumin 38/75/6433 3.8  3.5 - 5.0 g/dL Final  . AST 29/51/8841 49* 15 - 41 U/L Final  . ALT 01/01/2020 81* 0 - 44 U/L Final  . Alkaline Phosphatase 01/01/2020 102  38 - 126 U/L Final  . Total Bilirubin 01/01/2020 0.2* 0.3 - 1.2 mg/dL Final  . GFR, Estimated 01/01/2020 >  60  >60 mL/min Final  . Anion gap 01/01/2020 11  5 - 15 Final    Performed at [facility], [address] [place], [place] [address]  . WBC 01/01/2020 7.4  4.0 - 10.5 K/uL Final  . RBC 01/01/2020 4.57  3.87 - 5.11 MIL/uL Final  . Hemoglobin 01/01/2020 12.5  12.0 - 15.0 g/dL Final  . HCT 93/71/6967 40.3  36.0 - 46.0 % Final  . MCV 01/01/2020 88.2  80.0 - 100.0 fL Final  . MCH 01/01/2020 27.4  26.0 - 34.0 pg Final  . MCHC 01/01/2020 31.0  30.0 - 36.0 g/dL Final  . RDW 89/38/1017 14.6  11.5 - 15.5 % Final  . Platelets 01/01/2020 257  150 - 400 K/uL Final  . nRBC 01/01/2020 0.0  0.0 - 0.2 % Final   Performed at [facility], [address] [place], [place] [address]  Orders Only on 12/24/2019  Component Date Value Ref Range Status  . WBC 12/24/2019 7.0  3.4 - 10.8 x10E3/uL Final  . RBC 12/24/2019 4.65  3.77 - 5.28 x10E6/uL Final  . Hemoglobin 12/24/2019 13.0  11.1 - 15.9 g/dL Final  . Hematocrit 85/27/7824 40.4  34.0 - 46.6 % Final  . MCV 12/24/2019 87  79 - 97 fL Final  . MCH 12/24/2019 28.0  26.6 - 33.0 pg Final  . MCHC 12/24/2019 32.2  31.5 - 35.7 g/dL Final  . RDW 23/53/6144 14.9  11.7 - 15.4 % Final  . Platelets 12/24/2019 230  150 - 450 x10E3/uL Final    Allergies: Patient has no known allergies.  PTA Medications: (Not in a hospital admission)   Medical Decision Making  Admission orders placed  Patient states that she does not want to start medications at this time. States that the medication that her OB/GYN started was not helpful.   Clinical Course as of 05/26/20 0520  Thu May 26, 2020  0230 CBC with Differential/Platelet(!) CBC essentially normal [JB]  0230 Preg Test, Ur: NEGATIVE [JB]  0230 POCT Urine Drug Screen - (ICup) UDS negative [JB]  0518 TSH: 1.836 ALT 62, CMP otherwise WNL [JB]  0519 Hemoglobin A1C: 5.3 [JB]  0519 Cholesterol: 187 [JB]  0519 Triglycerides: 111 [JB]    Clinical Course User Index [JB] Jackelyn Poling, NP    Recommendations  Based on my evaluation the patient does not  appear to have an emergency medical condition.  Jackelyn Poling, NP 05/25/20  11:14 PM

## 2020-05-25 NOTE — BH Assessment (Signed)
TTS Triage:  Lisa Herrera is a 25yo female presenting as a walk in to [facility] for evaluation of suicidal thoughts.  [facility] interpreter services used throughout assessment (Spanish).  Pt reports that she has had suicidal ideation with vague plans (pt has been thinking about different ways) with unclear intent on follow through. Pt reports that she has been feeling a lot of depression and anxiety after the birth of her baby 6 months ago. Pt reports that she has a 27yo and did not have similar symptoms after the birth of her first child. Pt denies any HI, AVH, or substance use. Pt endorses paranoid ideation.   Pt states that she was under the care of a psychiatrist (televisit) but did not continue to follow up with them. Pt reports that her PCP did give her pills for depression (post partum depression) but she felt that the medication was not helpful and discontinued.   Pt presents with a very flat affect. "I feel dead inside"   Pt states that she feels that she is a danger to herself at time of assessment.    Weyman Pedro, MSW, LCSW Outpatient Therapist/Triage Specialist

## 2020-05-26 ENCOUNTER — Other Ambulatory Visit: Payer: Self-pay | Admitting: Registered Nurse

## 2020-05-26 ENCOUNTER — Encounter (HOSPITAL_COMMUNITY): Payer: Self-pay | Admitting: Emergency Medicine

## 2020-05-26 ENCOUNTER — Other Ambulatory Visit: Payer: Self-pay

## 2020-05-26 ENCOUNTER — Encounter (HOSPITAL_COMMUNITY): Payer: Self-pay | Admitting: Registered Nurse

## 2020-05-26 ENCOUNTER — Inpatient Hospital Stay (HOSPITAL_COMMUNITY)
Admission: AD | Admit: 2020-05-26 | Discharge: 2020-06-02 | DRG: 885 | Disposition: A | Discharge status: Home / Self Care | Payer: Medicaid Other | Source: Intra-hospital | Attending: Psychiatry | Admitting: Psychiatry

## 2020-05-26 DIAGNOSIS — Z20822 Contact with and (suspected) exposure to covid-19: Secondary | ICD-10-CM | POA: Diagnosis present

## 2020-05-26 DIAGNOSIS — G4730 Sleep apnea, unspecified: Secondary | ICD-10-CM | POA: Diagnosis present

## 2020-05-26 DIAGNOSIS — K2100 Gastro-esophageal reflux disease with esophagitis, without bleeding: Secondary | ICD-10-CM | POA: Diagnosis present

## 2020-05-26 DIAGNOSIS — F332 Major depressive disorder, recurrent severe without psychotic features: Secondary | ICD-10-CM

## 2020-05-26 DIAGNOSIS — R45851 Suicidal ideations: Secondary | ICD-10-CM | POA: Diagnosis present

## 2020-05-26 DIAGNOSIS — F410 Panic disorder [episodic paroxysmal anxiety] without agoraphobia: Secondary | ICD-10-CM | POA: Diagnosis present

## 2020-05-26 DIAGNOSIS — F322 Major depressive disorder, single episode, severe without psychotic features: Secondary | ICD-10-CM | POA: Diagnosis present

## 2020-05-26 DIAGNOSIS — G4700 Insomnia, unspecified: Secondary | ICD-10-CM | POA: Diagnosis present

## 2020-05-26 LAB — COMPREHENSIVE METABOLIC PANEL
ALT: 62 U/L — ABNORMAL HIGH (ref 0–44)
AST: 26 U/L (ref 15–41)
Albumin: 4.1 g/dL (ref 3.5–5.0)
Alkaline Phosphatase: 98 U/L (ref 38–126)
Anion gap: 10 (ref 5–15)
BUN: 10 mg/dL (ref 6–20)
CO2: 22 mmol/L (ref 22–32)
Calcium: 9.7 mg/dL (ref 8.9–10.3)
Chloride: 105 mmol/L (ref 98–111)
Creatinine, Ser: 0.56 mg/dL (ref 0.44–1.00)
GFR, Estimated: >60 mL/min (ref >60)
Glucose, Bld: 82 mg/dL (ref 70–99)
Potassium: 3.8 mmol/L (ref 3.5–5.1)
Sodium: 137 mmol/L (ref 135–145)
Total Bilirubin: 0.7 mg/dL (ref 0.3–1.2)
Total Protein: 7.3 g/dL (ref 6.5–8.1)

## 2020-05-26 LAB — LIPID PANEL
Cholesterol: 187 mg/dL (ref 0–200)
HDL: 55 mg/dL (ref >40)
LDL Cholesterol: 110 mg/dL — ABNORMAL HIGH (ref 0–99)
Total CHOL/HDL Ratio: 3.4 RATIO
Triglycerides: 111 mg/dL (ref <150)
VLDL: 22 mg/dL (ref 0–40)

## 2020-05-26 LAB — HEMOGLOBIN A1C
Hgb A1c MFr Bld: 5.3 % (ref 4.8–5.6)
Mean Plasma Glucose: 105.41 mg/dL

## 2020-05-26 LAB — CBC WITH DIFFERENTIAL/PLATELET
Abs Immature Granulocytes: 0.01 10*3/uL (ref 0.00–0.07)
Basophils Absolute: 0 10*3/uL (ref 0.0–0.1)
Basophils Relative: 0 %
Eosinophils Absolute: 0.1 10*3/uL (ref 0.0–0.5)
Eosinophils Relative: 1 %
HCT: 46.1 % — ABNORMAL HIGH (ref 36.0–46.0)
Hemoglobin: 15.1 g/dL — ABNORMAL HIGH (ref 12.0–15.0)
Immature Granulocytes: 0 %
Lymphocytes Relative: 41 %
Lymphs Abs: 3.4 10*3/uL (ref 0.7–4.0)
MCH: 28.9 pg (ref 26.0–34.0)
MCHC: 32.8 g/dL (ref 30.0–36.0)
MCV: 88.3 fL (ref 80.0–100.0)
Monocytes Absolute: 0.4 10*3/uL (ref 0.1–1.0)
Monocytes Relative: 5 %
Neutro Abs: 4.4 10*3/uL (ref 1.7–7.7)
Neutrophils Relative %: 53 %
Platelets: 333 10*3/uL (ref 150–400)
RBC: 5.22 MIL/uL — ABNORMAL HIGH (ref 3.87–5.11)
RDW: 14.1 % (ref 11.5–15.5)
WBC: 8.3 10*3/uL (ref 4.0–10.5)
nRBC: 0 % (ref 0.0–0.2)

## 2020-05-26 LAB — RESP PANEL BY RT-PCR (FLU A&B, COVID) ARPGX2
Influenza A by PCR: NEGATIVE
Influenza B by PCR: NEGATIVE
SARS Coronavirus 2 by RT PCR: NEGATIVE

## 2020-05-26 LAB — TSH: TSH: 1.836 u[IU]/mL (ref 0.350–4.500)

## 2020-05-26 MED ORDER — TRAZODONE HCL 50 MG PO TABS
50.0000 mg | ORAL_TABLET | Freq: Every evening | ORAL | Status: DC | PRN
  Administered 2020-05-26: 50 mg via ORAL
  Filled 2020-05-26 (×2): qty 1

## 2020-05-26 MED ORDER — HYDROXYZINE HCL 25 MG PO TABS
25.0000 mg | ORAL_TABLET | Freq: Four times a day (QID) | ORAL | Status: DC | PRN
  Administered 2020-05-26 – 2020-05-31 (×4): 25 mg via ORAL
  Filled 2020-05-26 (×5): qty 1

## 2020-05-26 MED ORDER — PANTOPRAZOLE SODIUM 40 MG PO TBEC
80.0000 mg | DELAYED_RELEASE_TABLET | Freq: Every day | ORAL | Status: DC
  Administered 2020-05-26 – 2020-06-01 (×7): 80 mg via ORAL
  Filled 2020-05-26 (×11): qty 2

## 2020-05-26 MED ORDER — ACETAMINOPHEN 325 MG PO TABS
650.0000 mg | ORAL_TABLET | Freq: Four times a day (QID) | ORAL | Status: DC | PRN
  Filled 2020-05-26: qty 2

## 2020-05-26 MED ORDER — ALUM & MAG HYDROXIDE-SIMETH 200-200-20 MG/5ML PO SUSP
30.0000 mL | ORAL | Status: DC | PRN
  Filled 2020-05-26: qty 30

## 2020-05-26 MED ORDER — MAGNESIUM HYDROXIDE 400 MG/5ML PO SUSP: 30.0000 mL | Freq: Every day | ORAL | Status: DC | PRN

## 2020-05-26 NOTE — Progress Notes (Signed)
Patient information has been sent to [facility] via secure chat to review for potential admission. Patient meets inpatient criteria per  Verbal conversation with Reola Calkins, NP.   Situation ongoing, CSW will continue to monitor progress.    Signed:  Corky Crafts, MSW, [place], LCASA 05/26/2020 9:33 AM

## 2020-05-26 NOTE — Tx Team (Signed)
Initial Treatment Plan 05/26/2020 7:35 PM Lisa Herrera [id number]    PATIENT STRESSORS: Other: Birth of child 6 months ago  Ongoing depression   PATIENT STRENGTHS: Financial means Physical Health Supportive family/friends   PATIENT IDENTIFIED PROBLEMS: Depression  Anxiety  Suicidal ideation    "To overcome my anxiety"  "To be ok being around my husband and children, they need me"           DISCHARGE CRITERIA:  Improved stabilization in mood, thinking, and/or behavior Need for constant or close observation no longer present Reduction of life-threatening or endangering symptoms to within safe limits Verbal commitment to aftercare and medication compliance  PRELIMINARY DISCHARGE PLAN: Outpatient therapy  PATIENT/FAMILY INVOLVEMENT: This treatment plan has been presented to and reviewed with the patient, Lisa Herrera.  The patient and family have been given the opportunity to ask questions and make suggestions.  Levin Bacon, RN 05/26/2020, 7:35 PM

## 2020-05-26 NOTE — ED Notes (Signed)
Pt sleeping at present, no distress noted, monitoring for safety. 

## 2020-05-26 NOTE — ED Notes (Signed)
Pt requested medication for heartburn. Maalox given. Informed pt to notify staff with any needs or concerns. Safety maintained.

## 2020-05-26 NOTE — Progress Notes (Addendum)
Pt accepted to [facility] room 401-1  Meets inpatient criteria per Reola Calkins, FNP  Dr. Jola Babinski is the attending provider.    Call report to [phone]    [name], RN @ [facility] notified.     Pt is Voluntary.    Pt may be transported by [employer].  Pt scheduled  to arrive at [facility] at 1400 PM   Signed:  Corky Crafts, MSW, [place], LCASA 05/26/2020 10:39 AM

## 2020-05-26 NOTE — ED Triage Notes (Signed)
Presents with suicidal ideations, no specific plan after birth of infant 6 mos ago.  Depression noted, with sad flat affect.  Denies HI or AVH.

## 2020-05-26 NOTE — ED Notes (Signed)
Breakfast given.  

## 2020-05-26 NOTE — Progress Notes (Signed)
With assistance interpretor [phone]) pt stated she did not want to hurt herself, but felt like she wanted to die sometimes. Pt stated she wanted to know her diagnosis and wanted to know why she has fear, sadness, no emotions at times and why she was feeling anxious. Pt stated she was fearful that she might commit suicide in the future due to the feelings she was having. Pt was encouraged to talk to the doctor tomorrow.      05/26/20 2100  Psych Admission Type (Psych Patients Only)  Admission Status Voluntary  Psychosocial Assessment  Patient Complaints Depression;Crying spells;Hopelessness  Eye Contact Fair  Facial Expression Sad;Worried  Affect Depressed;Sad  Speech Soft  Interaction Cautious  Motor Activity Slow  Appearance/Hygiene Other (Comment) (WNL)  Behavior Characteristics Appropriate to situation  Mood Depressed;Sad  Aggressive Behavior  Effect No apparent injury  Thought Process  Coherency Circumstantial  Content WDL  Delusions WDL  Perception WDL  Hallucination None reported or observed  Judgment Poor  Danger to Self  Current suicidal ideation? Passive  Self-Injurious Behavior Some self-injurious ideation observed or expressed.  No lethal plan expressed   Agreement Not to Harm Self Yes  Description of Agreement verbal contract for safety  Danger to Others  Danger to Others None reported or observed

## 2020-05-26 NOTE — Progress Notes (Signed)
Lisa Herrera transferred to [facility] per NP order. Discussed with the patient and all questions fully answered. An After Visit Summary, EMTALA and Med Necessity form were printed and given to the safe transport driver to hand over to receiving RN. Report given to receiving RN. Patient escorted out and was transported by safe transport.  [name]  05/26/2020 3:10 PM

## 2020-05-26 NOTE — Progress Notes (Signed)
Pt is awake and is currently using the phone.Pt is alert and oriented with flat affect. Pt denies pain, HI and AVH at this time. Pt endorses SI with no plan but verbally contracts for safety. Staff will monitor for pt's safety.

## 2020-05-26 NOTE — ED Provider Notes (Signed)
FBC/OBS ASAP Discharge Summary  Date and Time: 05/26/2020 1:42 PM  Name: Lisa Herrera  MRN:  [id number]   Discharge Diagnoses:  Final diagnoses:  Severe major depression, single episode, without psychotic features (HCC)  Suicidal ideation    Subjective: "I have not been able to feel any emotions or feelings.  I think that is why I started wanting to kill myself."  Stay Summary: Lisa Herrera 25 y.o. female patient presented to [facility] as a walk in with complaints of suicidal ideation.  Patient reporting worsening depression and anxiety after the birth of her baby 6 months ago.   Lisa Herrera, 89 y.o., female patient seen face to face by this provider, consulted with Dr. Nelly Rout; and chart reviewed on 05/26/20.  On evaluation Lisa Herrera reports she continues to feel suicidal and unable to contract for safety.  Patient reporting she was started on a medication but doesn't know the name of it "I stopped taking it because it was making my heart beat really fast."  Patient states over the last 3 days depression worsened and she had no emotion and wasn't able to feel anything and that is when suicidal thoughts started.   During evaluation Lisa Herrera is sitting in chair in no acute distress.  She is alert, oriented x 4, calm, cooperative and attentive.  Her mood is depressed and tearful with congruent affect.  She has normal speech, and behavior. Patient continues to endorse suicidal ideation; denies homicidal ideation, psychosis, and paranoia.  Patient has been accepted to [facility] for inpatient psychiatric treatment   Total Time spent with patient: 30 minutes  Past Psychiatric History: postpartum depression, anxiety Past Medical History:  Past Medical History:  Diagnosis Date  . Alpha thalassemia silent carrier 05/29/2019  . Gallstones   . Hepatic steatosis 02/04/2020  . Migraine   . Umbilical hernia without obstruction and without gangrene 02/04/2020    Past Surgical History:   Procedure Laterality Date  . CESAREAN SECTION N/A 01/14/2016   Procedure: CESAREAN SECTION;  Surgeon: Tereso Newcomer, MD;  Location: [facility]ING [facility];  Service: Obstetrics;  Laterality: N/A;  . CESAREAN SECTION N/A 11/17/2019   Procedure: CESAREAN SECTION;  Surgeon: Levie Heritage, DO;  Location: [facility] LD ORS;  Service: Obstetrics;  Laterality: N/A;  . CHOLECYSTECTOMY    . TUBAL LIGATION Bilateral 11/17/2019   Procedure: BILATERAL TUBAL LIGATION;  Surgeon: Levie Heritage, DO;  Location: [facility] LD ORS;  Service: Obstetrics;  Laterality: Bilateral;   Family History:  Family History  Problem Relation Age of Onset  . Breast cancer Maternal Aunt        50's  . Colon cancer Neg Hx   . Stomach cancer Neg Hx   . Esophageal cancer Neg Hx    Family Psychiatric History: None reported Social History:  Social History   Substance and Sexual Activity  Alcohol Use No  . Alcohol/week: 0.0 standard drinks     Social History   Substance and Sexual Activity  Drug Use No    Social History   Socioeconomic History  . Marital status: Married    Spouse name: Not on file  . Number of children: 1  . Years of education: Not on file  . Highest education level: Not on file  Occupational History  . Not on file  Tobacco Use  . Smoking status: Never Smoker  . Smokeless tobacco: Never Used  Vaping Use  . Vaping Use: Never used  Substance and Sexual Activity  .  Alcohol use: No    Alcohol/week: 0.0 standard drinks  . Drug use: No  . Sexual activity: Yes    Birth control/protection: None  Other Topics Concern  . Not on file  Social History Narrative   Married, originally from [place], Spanish-speaking.   Son born October 2021 and a daughter born in 2017   No alcohol no tobacco or drug use   Social Determinants of Corporate investment banker Strain: Not on file  Food Insecurity: Not on file  Transportation Needs: Not on file  Physical Activity: Not on file  Stress: Not on file  Social  Connections: Not on file   SDOH:  SDOH Screenings   Alcohol Screen: Not on file  Depression (EGB1-5): Not on file  Financial Resource Strain: Not on file  Food Insecurity: Not on file  Housing: Not on file  Physical Activity: Not on file  Social Connections: Not on file  Stress: Not on file  Tobacco Use: Low Risk   . Smoking Tobacco Use: Never Smoker  . Smokeless Tobacco Use: Never Used  Transportation Needs: Not on file    Has this patient used any form of tobacco in the last 30 days? (Cigarettes, Smokeless Tobacco, Cigars, and/or Pipes) Prescription not provided because: Doesn use tobacco products  Current Medications:  Current Facility-Administered Medications  Medication Dose Route Frequency Provider Last Rate Last Admin  . 0.9 %  sodium chloride infusion  500 mL Intravenous Once Iva Boop, MD      . acetaminophen (TYLENOL) tablet 650 mg  650 mg Oral Q6H PRN Jackelyn Poling, NP      . alum & mag hydroxide-simeth (MAALOX/MYLANTA) 200-200-20 MG/5ML suspension 30 mL  30 mL Oral Q4H PRN Nira Conn A, NP   30 mL at 05/26/20 1222  . hydrOXYzine (ATARAX/VISTARIL) tablet 25 mg  25 mg Oral TID PRN Nira Conn A, NP      . magnesium hydroxide (MILK OF MAGNESIA) suspension 30 mL  30 mL Oral Daily PRN Nira Conn A, NP      . traZODone (DESYREL) tablet 50 mg  50 mg Oral QHS PRN Jackelyn Poling, NP       Current Outpatient Medications  Medication Sig Dispense Refill  . omeprazole (PRILOSEC) 40 MG capsule Take 1 capsule (40 mg total) by mouth daily before breakfast. 30 capsule 2  . Prenatal Vit-Fe Fumarate-FA (PRENATAL MULTIVITAMIN) TABS tablet Take 1 tablet by mouth daily.       PTA Medications: (Not in a hospital admission)   Musculoskeletal  Strength & Muscle Tone: within normal limits Gait & Station: normal Patient leans: N/A  Psychiatric Specialty Exam  Presentation  General Appearance: Appropriate for Environment; Casual  Eye Contact:Good  Speech:Clear and  Coherent; Normal Rate  Speech Volume:Normal  Handedness:Right   Mood and Affect  Mood:Depressed; Anxious  Affect:Congruent; Depressed   Thought Process  Thought Processes:Coherent; Goal Directed  Descriptions of Associations:Intact  Orientation:Full (Time, Place and Person)  Thought Content:WDL  Hallucinations:Hallucinations: None  Ideas of Reference:None  Suicidal Thoughts:Suicidal Thoughts: Yes, Active SI Active Intent and/or Plan: With Intent; Without Plan  Homicidal Thoughts:Homicidal Thoughts: No   Sensorium  Memory:Immediate Good; Recent Good  Judgment:Intact  Insight:Present   Executive Functions  Concentration:Good  Attention Span:Good  Recall:Good  Fund of Knowledge:Fair  Language:Fair (Primary language Spanish an interpreter used)   Psychomotor Activity  Psychomotor Activity:Psychomotor Activity: Normal   Assets  Assets:Communication Skills; Desire for Improvement; Housing; Leisure Time; Resilience; Social Support  Sleep  Sleep:Sleep: Good   Nutritional Assessment (For OBS and FBC admissions only) Has the patient had a weight loss or gain of 10 pounds or more in the last 3 months?: No Has the patient had a decrease in food intake/or appetite?: No Does the patient have dental problems?: No Does the patient have eating habits or behaviors that may be indicators of an eating disorder including binging or inducing vomiting?: No Has the patient recently lost weight without trying?: No Has the patient been eating poorly because of a decreased appetite?: No Malnutrition Screening Tool Score: 0    Physical Exam  Physical Exam Vitals and nursing note reviewed. Exam conducted with a chaperone present.  Constitutional:      General: She is not in acute distress.    Appearance: Normal appearance. She is not ill-appearing.  HENT:     Head: Normocephalic.  Eyes:     Pupils: Pupils are equal, round, and reactive to light.   Cardiovascular:     Rate and Rhythm: Normal rate.  Pulmonary:     Effort: Pulmonary effort is normal.  Musculoskeletal:        General: Normal range of motion.     Cervical back: Normal range of motion.  Skin:    General: Skin is warm and dry.  Neurological:     Mental Status: She is alert and oriented to person, place, and time.  Psychiatric:        Attention and Perception: Attention and perception normal. She does not perceive auditory or visual hallucinations.        Mood and Affect: Mood is anxious and depressed. Affect is tearful.        Speech: Speech normal.        Behavior: Behavior is withdrawn.        Thought Content: Thought content is not paranoid or delusional. Thought content includes suicidal ideation. Thought content does not include homicidal ideation. Thought content includes suicidal plan.        Cognition and Memory: Cognition and memory normal.        Judgment: Judgment normal.    Review of Systems  Constitutional: Negative.   HENT: Negative.   Eyes: Negative.   Respiratory: Negative.   Cardiovascular: Negative.   Gastrointestinal: Negative.   Genitourinary: Negative.   Musculoskeletal: Negative.   Skin: Negative.   Neurological: Negative.   Endo/Heme/Allergies: Negative.   Psychiatric/Behavioral: Positive for depression and suicidal ideas. Negative for substance abuse. The patient is nervous/anxious. The patient does not have insomnia.    Blood pressure 120/75, pulse 100, temperature 98 F (36.7 C), temperature source Oral, resp. rate 16, SpO2 100 %, not currently breastfeeding. There is no height or weight on file to calculate BMI.  Disposition: Inpatient psychiatric treatment.  Accepted to [facility]  Zareah Hunzeker, NP 05/26/2020, 1:42 PM

## 2020-05-26 NOTE — Progress Notes (Signed)
Jayda is a 25 year old female being admitted voluntarily to 306-1 from [facility].  She presented to [facility] with suicidal ideations.  She has been feeling more depressed and anxious since the birth of her child 6 months ago.  [facility] admission process was completed with tele-interpreter.  During [facility] admission, she was pleasant but guarded.  Tearful, depressed, helpless and anxious.  She continued to voice passive SI with no plan or intent.  She verbally agreed to not harm herself on the unit.  She denied HI or A/V hallucinations. She lives with her husband and children.  She stated "I don't feel anything and I want to feel something for my children."  She has a supportive family.  She reported having sleep apnea and uses CPAP at home.  She will contact her husband to bring CPAP to [facility].  Oriented her to the unit.  Admission paperwork completed and signed.  Belongings searched and secured in locker # 40.  Skin assessment completed and noted well healed C-section scar and gallbladder surgery scar.  No contraband found.  Q 15 minute checks initiated for safety.  We will monitor the progress towards his goals.

## 2020-05-27 DIAGNOSIS — G4730 Sleep apnea, unspecified: Secondary | ICD-10-CM | POA: Diagnosis present

## 2020-05-27 DIAGNOSIS — F322 Major depressive disorder, single episode, severe without psychotic features: Secondary | ICD-10-CM | POA: Diagnosis present

## 2020-05-27 DIAGNOSIS — F410 Panic disorder [episodic paroxysmal anxiety] without agoraphobia: Secondary | ICD-10-CM | POA: Diagnosis present

## 2020-05-27 MED ORDER — HYDROXYZINE HCL 25 MG PO TABS
25.0000 mg | ORAL_TABLET | Freq: Once | ORAL | Status: DC
  Filled 2020-05-27 (×2): qty 1

## 2020-05-27 MED ORDER — SERTRALINE HCL 25 MG PO TABS
25.0000 mg | ORAL_TABLET | Freq: Every day | ORAL | Status: DC
  Administered 2020-05-27 – 2020-05-28 (×2): 25 mg via ORAL
  Filled 2020-05-27 (×4): qty 1

## 2020-05-27 NOTE — BHH Group Notes (Signed)
Type of Therapy and Topic:  Group Therapy:  Positive Affirmations   Participation Level:  Active  Description of Group: This group addressed positive affirmation toward self and others. Patients went around the room and identified two positive things about themselves and two positive things about a peer in the room. Patients reflected on how it felt to share something positive with others, to identify positive things about themselves, and to hear positive things from others. Patients were encouraged to have a daily reflection of positive characteristics or circumstances. Therapeutic Goals 1. Patient will verbalize two of their positive qualities 2. Patient will demonstrate empathy for others by stating two positive qualities about a peer in the group 3. Patient will verbalize their feelings when voicing positive self affirmations and when voicing positive affirmations of others 4. Patients will discuss the potential positive impact on their wellness/recovery of focusing on positive traits of self and others. Summary of Patient Progress:  The Pt attended the group and accepted the worksheets that were provided.   Therapeutic Modalities Cognitive Behavioral Therapy Motivational Interviewing 

## 2020-05-27 NOTE — Progress Notes (Addendum)
   05/27/20 0014  Vital Signs  Temp 98 F (36.7 C)  Temp Source Oral  Pulse Rate 84  Pulse Rate Source Monitor  BP (!) 132/114  BP Location Right Arm  BP Method Automatic  Patient Position (if appropriate) Sitting  Oxygen Therapy  SpO2 100 %  O2 Device Room Air   Pt up and c/o heart racing. Vitals assessed with dinamap. Manual blood pressure 120/90. Pt nervous because she does not have her CPAP. Pt wants someone to sit with her to make sure she is breathing. Q15 min checks performed and rise and fall of chest observed. Pt bed was elevated to facilitate ease of breathing without CPAP. Pt husband to bring tomorrow. Provider notified and gave verbal order to administer an extra one-time dose of Vistaril 25 mg. Will continue to monitor.  Pt refused medication when offered. Pt states she is relaxed now. Says her throat is sore from sleeping with her mouth open. Medication placed in pt med drawer in event she changes her mind.

## 2020-05-27 NOTE — [facility] Suicide Risk Assessment (Signed)
[facility] Admission Suicide Risk Assessment   Nursing information obtained from:  Patient Demographic factors:  unemployed Current Mental Status:  Passive SI prior to admission, worsening depression, panic attacks Loss Factors: Death of uncle last year, death of grandfather 2 weeks ago Historical Factors:  Family h/o mental health issues Risk Reduction Factors:  Religious beliefs about death,Responsible for children under 52 years of age,Living with another person, especially a relative  Total Time Spent in Direct Patient Care:  I personally spent 70 minutes on the unit in direct patient care. The direct patient care time included face-to-face time with the patient, reviewing the patient's chart, communicating with other professionals, and coordinating care. Greater than 50% of this time was spent in counseling or coordinating care with the patient regarding goals of hospitalization, psycho-education, and discharge planning needs.  Principal Problem: MDD (major depressive episode), single episode, severe, no psychosis (HCC) Diagnosis:  Principal Problem:   MDD (major depressive episode), single episode, severe, no psychosis (HCC) Active Problems:   Gastroesophageal reflux disease with esophagitis without hemorrhage   Breathing-related sleep disorder   Panic disorder  Subjective Data: The patient is a 25y/o Spanish speaking female with no known past psychiatric history who was seen as a walk-in to the [facility] on 05/25/2020 for evaluation of suicidal thoughts and worsening depression. She states that she sought treatment because she "felt very conscious that something was wrong with me because I felt no emotions."  She gives collateral history that between January and August 2021 she had onset of panic attacks with sensation of heart racing, panic feeling, shortness of breath, and feeling faint.She states that during this same time period she had multiple psychosocial stressors including being pregnant and  being told her son may be born with spina bifida, having a critically ill niece, and losing her uncle to COVID. She estimates she has had at least 4 severe panic attacks to date and is now worried she will have ongoing panic symptoms. She delivered her son in August without any spina bifida or birth complications and she feels she bonded well with him. She does not recall having onset of post-partum mood issues in the weeks following his birth and denies previous post-partum mood issues with the birth of her older child who is age 34. She denies being on psychotropic medications during her pregnancy.  She states within 3 months of delivering her son, she had more psychosocial stress when her brother told the family he is gay which has caused extreme conflict in the family. Around this same time she started experiencing abdominal pain with associated GI symptoms and saw a gastroenterologist who did an upper endoscopy and colonoscopy which were reportedly negative. She states that the gastroenterologist told her that her GI symptoms were related to anxiety and started her on an unknown medication which she took for 2 doses and stopped due to "feeling numb" on the drug. She since has been started on Omeprazole for GERD. She states that her PCP saw her in January 2022 and started her on an antidepressant for anxiety and mood symptoms. Her pill bottles confirm that she was prescribed Wellbutrin XL 150mg , but she states she only took 2 doses and then stopped the medication due to sensation of racing heart, increased anxiety, and poor sleep with this medication. She recalls that she saw a therapist via video-chat for 2 sessions but has not continued psychotherapy. She states that due to concerns about the severity of her panic attacks, she and her husband  and children have either all lived with her mother or her mother-in-law since for the last 6 months because she is so worried she will have a panic attack and be unable  to care for the children while her husband is at work.   She states that 2 weeks ago her grandfather died and she flew with family to [place] for the funeral. She recalls having panic symptoms with the flights to and from [place]. While in [place], she started Vitamin B12 and got a shot of B12 to "help with sleep." In the last several weeks, she states she has had onset of emotional blunting, depressed mood, tearfulness, and poor sleep. She became worried when she did not have emotions to express love toward her children. She denies ever having thoughts of hurting/killing her children and denies previous HI or violent episodes. She states in the last several weeks she has had anhedonia, has given up church and family functions, has had low energy and poor focus, and has had low appetite. She states her baby son fell off the bed despite being supervised, and she has had ruminating guilt that this event happened even though he was not hurt. She states that when her panic attacks are severe she has thoughts that she is a burden to her family and has suicidal thinking.  During one severe panic attack she recalls having thoughts of jumping out of a moving vehicle in the context of her distress. She denies ever having attempted suicide in the past and denies previous psychiatric inpatient admissions. She denies h/o mania/hypomania, PTSD symptoms, AVH, paranoia, or delusions. She denies h/o drug or alcohol use. Patient is not breastfeeding at this time.  Continued Clinical Symptoms:  Alcohol Use Disorder Identification Test Final Score (AUDIT): 0 The "Alcohol Use Disorders Identification Test", Guidelines for Use in Primary Care, Second Edition.  World Science writer [facility]). Score between 0-7:  no or low risk or alcohol related problems. Score between 8-15:  moderate risk of alcohol related problems. Score between 16-19:  high risk of alcohol related problems. Score 20 or above:  warrants further diagnostic  evaluation for alcohol dependence and treatment.  CLINICAL FACTORS:   Panic Attacks Depression:   Anhedonia Hopelessness Insomnia More than one psychiatric diagnosis Previous Psychiatric Diagnoses and Treatments  Musculoskeletal: Strength & Muscle Tone: within normal limits Gait & Station: normal , steady Patient leans: N/A  Psychiatric Specialty Exam: Physical Exam Vitals reviewed.  HENT:     Head: Normocephalic.     Mouth/Throat:     Mouth: Mucous membranes are moist.  Eyes:     Extraocular Movements: Extraocular movements intact.  Cardiovascular:     Rate and Rhythm: Normal rate and regular rhythm.  Pulmonary:     Effort: Pulmonary effort is normal.     Breath sounds: Normal breath sounds.  Musculoskeletal:        General: Normal range of motion.  Skin:    General: Skin is warm and dry.  Neurological:     Mental Status: She is alert.     Comments: CN 3-12 grossly intact; intact finger to nose; equal grip     Review of Systems  Constitutional: Positive for fatigue. Negative for fever.  HENT: Negative.   Respiratory: Positive for shortness of breath.   Cardiovascular: Negative for chest pain.  Gastrointestinal: Positive for diarrhea. Negative for abdominal pain, nausea and vomiting.  Genitourinary: Negative for dysuria.  Musculoskeletal: Negative for arthralgias.  Skin: Negative for rash.  Neurological: Negative for  headaches.    Blood pressure 125/85, pulse (!) 114, temperature 98 F (36.7 C), temperature source Oral, resp. rate 14, height 5' 1.5" (1.562 m), weight 107.7 kg, SpO2 100 %, not currently breastfeeding.Body mass index is 44.15 kg/m.  General Appearance: casually dressed, well engaged with interpreter and examiner; appears stated age, adequate hygiene  Eye Contact:  Good  Speech:  Speaks Spanish  Volume:  Normal  Mood:  Anxious and Dysphoric  Affect:  Constricted and Tearful  Thought Process:  Goal Directed and Linear  Orientation:  Full  (Time, Place, and Person)  Thought Content:  Denies AVH, paranoia, or delusions; no obsessions/compulsions, delusions, or acute psychosis noted on exam  Suicidal Thoughts:  Denied at this time  Homicidal Thoughts:  Denied  Memory:  Recent;   Good  Judgement:  Intact  Insight:  Fair  Psychomotor Activity:  Normal  Concentration:  Concentration: Good  Recall:  Good  Fund of Knowledge:  Good  Language:  Spanish speaking - uses interpreter  Akathisia:  Negative  Assets:  Communication Skills Desire for Improvement Housing Resilience Social Support  ADL's:  Intact  Cognition:  WNL  Sleep:  Number of Hours: 4.5   COGNITIVE FEATURES THAT CONTRIBUTE TO RISK:  None    SUICIDE RISK:   Moderate:  Frequent suicidal ideation with limited intensity, and duration, some specificity in terms of plans, no associated intent, good self-control, limited dysphoria/symptomatology, some risk factors present, and identifiable protective factors, including available and accessible social support.  PLAN OF CARE: See admission H&P for details.  I certify that inpatient services furnished can reasonably be expected to improve the patient's condition.   Comer Locket, MD, FAPA 05/27/2020, 7:15 PM

## 2020-05-27 NOTE — Progress Notes (Signed)
[place] NOVEL CORONAVIRUS (COVID-19) DAILY CHECK-OFF SYMPTOMS - answer yes or no to each - every day NO YES  Have you had a fever in the past 24 hours?  . Fever (Temp > 37.80C / 100F) X   Have you had any of these symptoms in the past 24 hours? . New Cough .  Sore Throat  .  Shortness of Breath .  Difficulty Breathing .  Unexplained Body Aches   X   Have you had any one of these symptoms in the past 24 hours not related to allergies?   . Runny Nose .  Nasal Congestion .  Sneezing   X   If you have had runny nose, nasal congestion, sneezing in the past 24 hours, has it worsened?  X   EXPOSURES - check yes or no X   Have you traveled outside the state in the past 14 days?  X   Have you been in contact with someone with a confirmed diagnosis of COVID-19 or PUI in the past 14 days without wearing appropriate PPE?  X   Have you been living in the same home as a person with confirmed diagnosis of COVID-19 or a PUI (household contact)?    X   Have you been diagnosed with COVID-19?    X              What to do next: Answered NO to all: Answered YES to anything:   Proceed with unit schedule Follow the [facility] Inpatient Flowsheet.   

## 2020-05-27 NOTE — Progress Notes (Addendum)
   05/27/20 1100  Psych Admission Type (Psych Patients Only)  Admission Status Voluntary  Psychosocial Assessment  Patient Complaints Depression;Insomnia;Decreased concentration  Eye Contact Fair  Facial Expression Sad;Worried  Affect Depressed;Sad  Speech Soft  Interaction Cautious  Motor Activity Slow  Appearance/Hygiene Other (Comment) (WNL)  Behavior Characteristics Cooperative;Appropriate to situation  Mood Depressed;Sad  Aggressive Behavior  Effect No apparent injury  Thought Process  Coherency Circumstantial  Content WDL  Delusions WDL  Perception WDL  Hallucination None reported or observed  Judgment Poor  Danger to Self  Current suicidal ideation? Passive  Self-Injurious Behavior Some self-injurious ideation observed or expressed.  No lethal plan expressed   Agreement Not to Harm Self Yes  Description of Agreement verbal contract for safety  Danger to Others  Danger to Others None reported or observed   D. Pt presents with a flat affect, depressed mood- pt endorses poor sleep, poor concentration and very low energy. Per pt's self inventory, pt rated her depression, hopelessness and anxiety a 9/4/0, respectively. Pt reports "I wanna feel better. I wanna feel little happy".Pt currently denies SI/HI and AVH  A. Labs and vitals monitored. Pt supported emotionally and encouraged to express concerns and ask questions.   R. Pt remains safe with 15 minute checks. Will continue POC.

## 2020-05-27 NOTE — Progress Notes (Signed)
Pt stated she was feeling a little better. Pt visible on the unit interacting with peers.    05/27/20 2300  Psych Admission Type (Psych Patients Only)  Admission Status Voluntary  Psychosocial Assessment  Eye Contact Fair  Facial Expression Sad;Worried  Affect Depressed;Sad  Speech Soft  Interaction Cautious  Motor Activity Slow  Appearance/Hygiene Other (Comment) (WNL)  Behavior Characteristics Cooperative  Mood Depressed;Sad  Aggressive Behavior  Effect No apparent injury  Thought Process  Coherency Circumstantial  Content WDL  Delusions WDL  Perception WDL  Hallucination None reported or observed  Judgment Poor  Confusion WDL  Danger to Self  Current suicidal ideation? Passive  Self-Injurious Behavior Some self-injurious ideation observed or expressed.  No lethal plan expressed   Agreement Not to Harm Self Yes  Description of Agreement verbal contract for safety  Danger to Others  Danger to Others None reported or observed

## 2020-05-27 NOTE — Progress Notes (Addendum)
Adult Psychoeducational Group Note  Date:  05/27/2020 Time:  10:22 AM  Group Topic/Focus:  Goals Group:   The focus of this group is to help patients establish daily goals to achieve during treatment and discuss how the patient can incorporate goal setting into their daily lives to aide in recovery.  Participation Level:  Active  Participation Quality:  Appropriate  Affect:  Appropriate  Cognitive:  Appropriate  Insight: Appropriate  Engagement in Group:  Engaged  Modes of Intervention:  Discussion  Additional Comments:  Patient attended group and participated.  Delicia W [place] 05/27/2020, 10:22 AM

## 2020-05-27 NOTE — Progress Notes (Signed)
Husband brought in CPAP machine- placed in cabinet at nurse's station.  Husband also brought pt's medications from home (Buproprion XL 150 mg.and Omeprazole 40 mg) Placed in pt's locker.

## 2020-05-27 NOTE — Tx Team (Signed)
Interdisciplinary Treatment and Diagnostic Plan Update  05/27/2020 Time of Session: 9:20am  Lisa Herrera MRN: [id number]  Principal Diagnosis: <principal problem not specified>  Secondary Diagnoses: Active Problems:   MDD (major depressive disorder), recurrent severe, without psychosis ([place])   Current Medications:  Current Facility-Administered Medications  Medication Dose Route Frequency Provider Last Rate Last Admin  . acetaminophen (TYLENOL) tablet 650 mg  650 mg Oral Q6H PRN Sharma Covert, MD      . alum & mag hydroxide-simeth (MAALOX/MYLANTA) 200-200-20 MG/5ML suspension 30 mL  30 mL Oral Q4H PRN Sharma Covert, MD      . hydrOXYzine (ATARAX/VISTARIL) tablet 25 mg  25 mg Oral Q6H PRN Connye Burkitt, NP   25 mg at 05/26/20 2213  . hydrOXYzine (ATARAX/VISTARIL) tablet 25 mg  25 mg Oral Once Lindon Romp A, NP      . magnesium hydroxide (MILK OF MAGNESIA) suspension 30 mL  30 mL Oral Daily PRN Sharma Covert, MD      . pantoprazole (PROTONIX) EC tablet 80 mg  80 mg Oral Daily Connye Burkitt, NP   80 mg at 05/27/20 0929  . traZODone (DESYREL) tablet 50 mg  50 mg Oral QHS PRN,MR X 1 Connye Burkitt, NP   50 mg at 05/26/20 2213   PTA Medications: Medications Prior to Admission  Medication Sig Dispense Refill Last Dose  . omeprazole (PRILOSEC) 40 MG capsule Take 1 capsule (40 mg total) by mouth daily before breakfast. 30 capsule 2   . Prenatal Vit-Fe Fumarate-FA (PRENATAL MULTIVITAMIN) TABS tablet Take 1 tablet by mouth daily.        Patient Stressors: Other: Birth of child 6 months ago  Patient Strengths: Scientist, research (life sciences) Physical Health Supportive family/friends  Treatment Modalities: Medication Management, Group therapy, Case management,  1 to 1 session with clinician, Psychoeducation, Recreational therapy.   Physician Treatment Plan for Primary Diagnosis: <principal problem not specified> Long Term Goal(s):     Short Term Goals:    Medication Management:  Evaluate patient's response, side effects, and tolerance of medication regimen.  Therapeutic Interventions: 1 to 1 sessions, Unit Group sessions and Medication administration.  Evaluation of Outcomes: Not Met  Physician Treatment Plan for Secondary Diagnosis: Active Problems:   MDD (major depressive disorder), recurrent severe, without psychosis ([place])  Long Term Goal(s):     Short Term Goals:       Medication Management: Evaluate patient's response, side effects, and tolerance of medication regimen.  Therapeutic Interventions: 1 to 1 sessions, Unit Group sessions and Medication administration.  Evaluation of Outcomes: Not Met   RN Treatment Plan for Primary Diagnosis: <principal problem not specified> Long Term Goal(s): Knowledge of disease and therapeutic regimen to maintain health will improve  Short Term Goals: Ability to remain free from injury will improve, Ability to participate in decision making will improve, Ability to verbalize feelings will improve, Ability to disclose and discuss suicidal ideas and Ability to identify and develop effective coping behaviors will improve  Medication Management: RN will administer medications as ordered by provider, will assess and evaluate patient's response and provide education to patient for prescribed medication. RN will report any adverse and/or side effects to prescribing provider.  Therapeutic Interventions: 1 on 1 counseling sessions, Psychoeducation, Medication administration, Evaluate responses to treatment, Monitor vital signs and CBGs as ordered, Perform/monitor CIWA, COWS, AIMS and Fall Risk screenings as ordered, Perform wound care treatments as ordered.  Evaluation of Outcomes: Not Met   LCSW Treatment Plan for  Primary Diagnosis: <principal problem not specified> Long Term Goal(s): Safe transition to appropriate next level of care at discharge, Engage patient in therapeutic group addressing interpersonal concerns.  Short  Term Goals: Engage patient in aftercare planning with referrals and resources, Increase social support, Increase emotional regulation, Facilitate acceptance of mental health diagnosis and concerns, Identify triggers associated with mental health/substance abuse issues and Increase skills for wellness and recovery  Therapeutic Interventions: Assess for all discharge needs, 1 to 1 time with Social worker, Explore available resources and support systems, Assess for adequacy in community support network, Educate family and significant other(s) on suicide prevention, Complete Psychosocial Assessment, Interpersonal group therapy.  Evaluation of Outcomes: Not Met   Progress in Treatment: Attending groups: Yes. Participating in groups: Yes. Taking medication as prescribed: Yes. Toleration medication: Yes. Family/Significant other contact made: No, will contact:  If consents are given  Patient understands diagnosis: Yes. Discussing patient identified problems/goals with staff: Yes. Medical problems stabilized or resolved: Yes. Denies suicidal/homicidal ideation: Yes. Issues/concerns per patient self-inventory: No.   New problem(s) identified: No, Describe:  None  New Short Term/Long Term Goal(s): medication stabilization, elimination of SI thoughts, development of comprehensive mental wellness plan.   Patient Goals:  "I want to feel emotions again and reduce anxiety"   Discharge Plan or Barriers: Patient recently admitted. CSW will continue to follow and assess for appropriate referrals and possible discharge planning.   Reason for Continuation of Hospitalization: Anxiety Depression Medication stabilization Suicidal ideation  Estimated Length of Stay: 3 to 5 days   Attendees: Patient: Lisa Herrera  05/27/2020  Physician: Viann Fish, MD 05/27/2020   Nursing:  05/27/2020   RN Care Manager: 05/27/2020   Social Worker: Verdis Frederickson, [place] 05/27/2020   Recreational Therapist:  05/27/2020   Other:   05/27/2020   Other:  05/27/2020   Other: 05/27/2020     Scribe for Treatment Team: Darleen Crocker, [place] 05/27/2020 12:45 PM

## 2020-05-27 NOTE — H&P (Signed)
Psychiatric Admission Assessment Adult  Patient Identification: Lisa Herrera MRN:  [id number] Date of Evaluation:  05/27/2020   Total Time Spent in Direct Patient Care:  I personally spent 70 minutes on the unit in direct patient care. The direct patient care time included face-to-face time with the patient, reviewing the patient's chart, communicating with other professionals, and coordinating care. Greater than 50% of this time was spent in counseling or coordinating care with the patient regarding goals of hospitalization, psycho-education, and discharge planning needs.  Chief Complaint:  Depression with SI  Principal Diagnosis: MDD (major depressive episode), single episode, severe, no psychosis (HCC) Diagnosis:  Principal Problem:   MDD (major depressive episode), single episode, severe, no psychosis (HCC) Active Problems:   Gastroesophageal reflux disease with esophagitis without hemorrhage   Breathing-related sleep disorder   Panic disorder  History of Present Illness: (The patient is interviewed using an in person Spanish interpreter.)  The patient is a 25y/o Spanish speaking female with no known past psychiatric history who was seen as a walk-in to the [facility] on 05/25/2020 for evaluation of suicidal thoughts and worsening depression. She states that she sought treatment because she "felt very conscious that something was wrong with me because I felt no emotions."  She gives collateral history that between January and August 2021 she had onset of panic attacks with sensation of heart racing, panic feeling, shortness of breath, and feeling faint.She states that during this same time period she had multiple psychosocial stressors including being pregnant and being told her son may be born with spina bifida, having a critically ill niece, and losing her uncle to COVID. She estimates she has had at least 4 severe panic attacks to date and is now worried she will have ongoing panic symptoms. She  delivered her son in August without any spina bifida or birth complications and she feels she bonded well with him. She does not recall having onset of post-partum mood issues in the weeks following his birth and denies previous post-partum mood issues with the birth of her older child who is age 104. She denies being on psychotropic medications during her pregnancy.  She states within 3 months of delivering her son, she had more psychosocial stress when her brother told the family he is gay which has caused extreme conflict in the family. Around this same time she started experiencing abdominal pain with associated GI symptoms and saw a gastroenterologist who did an upper endoscopy and colonoscopy which were reportedly negative. She states that the gastroenterologist told her that her GI symptoms were related to anxiety and started her on an unknown medication which she took for 2 doses and stopped due to "feeling numb" on the drug. She since has been started on Omeprazole for GERD. She states that her PCP saw her in January 2022 and started her on an antidepressant for anxiety and mood symptoms. Her pill bottles confirm that she was prescribed Wellbutrin XL , but she states she only took 2 doses and then stopped the medication due to sensation of racing heart, increased anxiety, and poor sleep with this medication. She recalls that she saw a therapist via video-chat for 2 sessions but has not continued psychotherapy. She states that due to concerns about the severity of her panic attacks, she and her husband and children have either all lived with her mother or her mother-in-law since for the last 6 months because she is so worried she will have a panic attack and be unable to care  for the children while her husband is at work.   She states that 2 weeks ago her grandfather died and she flew with family to [place] for the funeral. She recalls having panic symptoms with the flights to and from [place]. While in  [place], she started Vitamin B12 and got a shot of B12 to "help with sleep." In the last several weeks, she states she has had onset of emotional blunting, depressed mood, tearfulness, and poor sleep. She became worried when she did not have emotions to express love toward her children. She denies ever having thoughts of hurting/killing her children and denies previous HI or violent episodes. She states in the last several weeks she has had anhedonia, has given up church and family functions, has had low energy and poor focus, and has had low appetite. She states her baby son fell off the bed despite being supervised, and she has had ruminating guilt that this event happened even though he was not hurt. She states that when her panic attacks are severe she has thoughts that she is a burden to her family and has suicidal thinking.  During one severe panic attack she recalls having thoughts of jumping out of a moving vehicle in the context of her distress. She denies ever having attempted suicide in the past and denies previous psychiatric inpatient admissions. She denies h/o mania/hypomania, PTSD symptoms, AVH, paranoia, or delusions. She denies h/o drug or alcohol use. Patient is not breastfeeding at this time.  Associated Signs/Symptoms: Depression Symptoms:  depressed mood, anhedonia, insomnia, feelings of worthlessness/guilt, difficulty concentrating, hopelessness, suicidal thoughts without plan, anxiety, panic attacks, loss of energy/fatigue, Duration of Depression Symptoms: Greater than two weeks  (Hypo) Manic Symptoms:  None Anxiety Symptoms:  Excessive Worry, Panic Symptoms, Psychotic Symptoms:  None Duration of Psychotic Symptoms: No data recorded PTSD Symptoms: None  Past Psychiatric History:  Previous Psychiatric Diagnoses: diagnosed with anxiety and depression by PCP Current / Past Mental Health Providers: saw a therapist via video-chat for 2 sessions this year Previous  Psychological Evaluations: presumably during video session with therapist Past Psychiatric Hospitalizations: Denied Past Suicide Attempts: Denied Past History of Homicidal Behaviors / Violent or Aggressive Behaviors: Denied Previous Participation in PHP/IOP or Residential Programs: Denied Past History of ECT / [employer] / VNS: Denied Past Psychotropic Medication Trials: Wellbutrin XL 150mg  for 2 doses and stopped due to side-effect  Is the patient at risk to self? Yes.    Has the patient been a risk to self in the past 6 months? Yes.    Has the patient been a risk to self within the distant past? No.  Is the patient a risk to others? No.  Has the patient been a risk to others in the past 6 months? No.  Has the patient been a risk to others within the distant past? No.   Substance Use History: Substance Abuse History in the last 12 months: No. Illicit Drug Use: Denied IV Drug Use: No. Alcohol Use / Abuse: Denied Prescription Drug Abuse: Denied History of Detox / Rehab: Denied History of Withdrawal / Blackouts / DTs: Denied Consequences of Substance Use: NA   Alcohol Screening: 1. How often do you have a drink containing alcohol?: Never 2. How many drinks containing alcohol do you have on a typical day when you are drinking?: 1 or 2 3. How often do you have six or more drinks on one occasion?: Never AUDIT-C Score: 0 9. Have you or someone else been injured as a  result of your drinking?: No 10. Has a relative or friend or a doctor or another health worker been concerned about your drinking or suggested you cut down?: No Alcohol Use Disorder Identification Test Final Score (AUDIT): 0 Alcohol Brief Interventions/Follow-up: AUDIT Score <7 follow-up not indicated  Past Medical History:  Past Medical History:  Diagnosis Date  . Alpha thalassemia silent carrier 05/29/2019  . Gallstones   . Hepatic steatosis 02/04/2020  . Migraine   . Umbilical hernia without obstruction and without gangrene  02/04/2020   sleep apnea on CPAP  Past Surgical History:  Procedure Laterality Date  . CESAREAN SECTION N/A 01/14/2016   Procedure: CESAREAN SECTION;  Surgeon: Tereso Newcomer, MD;  Location: [facility];  Service: Obstetrics;  Laterality: N/A;  . CESAREAN SECTION N/A 11/17/2019   Procedure: CESAREAN SECTION;  Surgeon: Levie Heritage, DO;  Location: [facility];  Service: Obstetrics;  Laterality: N/A;  . CHOLECYSTECTOMY    . TUBAL LIGATION Bilateral 11/17/2019   Procedure: BILATERAL TUBAL LIGATION;  Surgeon: Levie Heritage, DO;  Location: [facility];  Service: Obstetrics;  Laterality: Bilateral;   Family History:  Family History  Problem Relation Age of Onset  . Breast cancer Maternal Aunt        50's  . Colon cancer Neg Hx   . Stomach cancer Neg Hx   . Esophageal cancer Neg Hx    Family Psychiatric  History: MGF with schizophrenia and brother with possible schizophrenia; no suicides or substance abuse in the family  Tobacco Screening: Have you used any form of tobacco in the last 30 days? (Cigarettes, Smokeless Tobacco, Cigars, and/or Pipes): No   Social History:  History of Physical / Emotional / Sexual Abuse: Denied Highest Level of Education Obtained: Graduated HS in the [place] Occupational History / Employment Status: Unemployed Marital Status / Relationship History: Married Parenting History: 53mo old son and 46 y/o daughter Living Situation: lives with husband and children either with mother or mother-in-law for past 6 months Spiritual History: Emergency planning/management officer Service: Denied Current / Pending / Museum/gallery conservator or Previous Biomedical engineer / Prison Time: Denied Access to Firearms: Denied  Allergies:   Allergies  Allergen Reactions  . Bupropion     Anxiousness Tachycardia   Lab Results:  Results for orders placed or performed during the hospital encounter of 05/25/20 (from the past 48 hour(s))  POCT Urine Drug Screen - ([facility])     Status: Normal   Collection Time:  05/25/20 11:26 PM  Result Value Ref Range   POC Amphetamine UR None Detected NONE DETECTED (Cut Off Level 1000 ng/mL)   POC Secobarbital (BAR) None Detected NONE DETECTED (Cut Off Level 300 ng/mL)   POC Buprenorphine (BUP) None Detected NONE DETECTED (Cut Off Level 10 ng/mL)   POC Oxazepam (BZO) None Detected NONE DETECTED (Cut Off Level 300 ng/mL)   POC Cocaine UR None Detected NONE DETECTED (Cut Off Level 300 ng/mL)   POC Methamphetamine UR None Detected NONE DETECTED (Cut Off Level 1000 ng/mL)   POC Morphine None Detected NONE DETECTED (Cut Off Level 300 ng/mL)   POC Oxycodone UR None Detected NONE DETECTED (Cut Off Level 100 ng/mL)   POC Methadone UR None Detected NONE DETECTED (Cut Off Level 300 ng/mL)   POC Marijuana UR None Detected NONE DETECTED (Cut Off Level 50 ng/mL)  Pregnancy, urine POC     Status: None   Collection Time: 05/25/20 11:28 PM  Result Value Ref Range   Preg Test,  Ur NEGATIVE NEGATIVE    Comment:        THE SENSITIVITY OF THIS METHODOLOGY IS >24 mIU/mL   Resp Panel by RT-PCR (Flu A&B, Covid) Nasopharyngeal Swab     Status: None   Collection Time: 05/25/20 11:39 PM   Specimen: Nasopharyngeal Swab; Nasopharyngeal(NP) swabs in vial transport medium  Result Value Ref Range   SARS Coronavirus 2 by RT PCR NEGATIVE NEGATIVE    Comment: (NOTE) SARS-CoV-2 target nucleic acids are NOT DETECTED.  The SARS-CoV-2 RNA is generally detectable in upper respiratory specimens during the acute phase of infection. The lowest concentration of SARS-CoV-2 viral copies this assay can detect is 138 copies/mL. A negative result does not preclude SARS-Cov-2 infection and should not be used as the sole basis for treatment or other patient management decisions. A negative result may occur with  improper specimen collection/handling, submission of specimen other than nasopharyngeal swab, presence of viral mutation(s) within the areas targeted by this assay, and inadequate number of  viral copies(<138 copies/mL). A negative result must be combined with clinical observations, patient history, and epidemiological information. The expected result is Negative.  Fact Sheet for Patients:  BloggerCourse.com  Fact Sheet for Healthcare Providers:  SeriousBroker.it  This test is no t yet approved or cleared by the [place] FDA and  has been authorized for detection and/or diagnosis of SARS-CoV-2 by FDA under an Emergency Use Authorization (EUA). This EUA will remain  in effect (meaning this test can be used) for the duration of the COVID-19 declaration under Section 564(b)(1) of the Act, 21 U.S.C.section 360bbb-3(b)(1), unless the authorization is terminated  or revoked sooner.       Influenza A by PCR NEGATIVE NEGATIVE   Influenza B by PCR NEGATIVE NEGATIVE    Comment: (NOTE) The Xpert Xpress SARS-CoV-2/FLU/RSV plus assay is intended as an aid in the diagnosis of influenza from Nasopharyngeal swab specimens and should not be used as a sole basis for treatment. Nasal washings and aspirates are unacceptable for Xpert Xpress SARS-CoV-2/FLU/RSV testing.  Fact Sheet for Patients: BloggerCourse.com  Fact Sheet for Healthcare Providers: SeriousBroker.it  This test is not yet approved or cleared by the [place] FDA and has been authorized for detection and/or diagnosis of SARS-CoV-2 by FDA under an Emergency Use Authorization (EUA). This EUA will remain in effect (meaning this test can be used) for the duration of the COVID-19 declaration under Section 564(b)(1) of the Act, 21 U.S.C. section 360bbb-3(b)(1), unless the authorization is terminated or revoked.  Performed at [facility], [address], [place], [place] [address]   POC SARS Coronavirus 2 Ag-ED - Nasal Swab     Status: None (Preliminary result)   Collection Time: 05/25/20 11:39 PM  Result  Value Ref Range   SARS Coronavirus 2 Ag Negative Negative  CBC with Differential/Platelet     Status: Abnormal   Collection Time: 05/25/20 11:49 PM  Result Value Ref Range   WBC 8.3 4.0 - 10.5 K/uL   RBC 5.22 (H) 3.87 - 5.11 MIL/uL   Hemoglobin 15.1 (H) 12.0 - 15.0 g/dL   HCT 85.8 (H) 85.0 - 27.7 %   MCV 88.3 80.0 - 100.0 fL   MCH 28.9 26.0 - 34.0 pg   MCHC 32.8 30.0 - 36.0 g/dL   RDW 41.2 87.8 - 67.6 %   Platelets 333 150 - 400 K/uL   nRBC 0.0 0.0 - 0.2 %   Neutrophils Relative % 53 %   Neutro Abs 4.4 1.7 -  7.7 K/uL   Lymphocytes Relative 41 %   Lymphs Abs 3.4 0.7 - 4.0 K/uL   Monocytes Relative 5 %   Monocytes Absolute 0.4 0.1 - 1.0 K/uL   Eosinophils Relative 1 %   Eosinophils Absolute 0.1 0.0 - 0.5 K/uL   Basophils Relative 0 %   Basophils Absolute 0.0 0.0 - 0.1 K/uL   Immature Granulocytes 0 %   Abs Immature Granulocytes 0.01 0.00 - 0.07 K/uL    Comment: Performed at [facility], [address] [place], [place] [address]  Comprehensive metabolic panel     Status: Abnormal   Collection Time: 05/25/20 11:49 PM  Result Value Ref Range   Sodium 137 135 - 145 mmol/L   Potassium 3.8 3.5 - 5.1 mmol/L   Chloride 105 98 - 111 mmol/L   CO2 22 22 - 32 mmol/L   Glucose, Bld 82 70 - 99 mg/dL    Comment: Glucose reference range applies only to samples taken after fasting for at least 8 hours.   BUN 10 6 - 20 mg/dL   Creatinine, Ser 6.04 0.44 - 1.00 mg/dL   Calcium 9.7 8.9 - 54.0 mg/dL   Total Protein 7.3 6.5 - 8.1 g/dL   Albumin 4.1 3.5 - 5.0 g/dL   AST 26 15 - 41 U/L   ALT 62 (H) 0 - 44 U/L   Alkaline Phosphatase 98 38 - 126 U/L   Total Bilirubin 0.7 0.3 - 1.2 mg/dL   GFR, Estimated >98 >11 mL/min    Comment: (NOTE) Calculated using the CKD-EPI Creatinine Equation (2021)    Anion gap 10 5 - 15    Comment: Performed at [facility], [address] [place], [place] [address]  Hemoglobin A1c     Status: None   Collection Time: 05/25/20 11:49 PM  Result Value  Ref Range   Hgb A1c MFr Bld 5.3 4.8 - 5.6 %    Comment: (NOTE) Pre diabetes:          5.7%-6.4%  Diabetes:              >6.4%  Glycemic control for   <7.0% adults with diabetes    Mean Plasma Glucose 105.41 mg/dL    Comment: Performed at [facility], [address], [place], [place] [address]  TSH     Status: None   Collection Time: 05/25/20 11:49 PM  Result Value Ref Range   TSH 1.836 0.350 - 4.500 uIU/mL    Comment: Performed by a 3rd Generation assay with a functional sensitivity of <=0.01 uIU/mL. Performed at [facility], [address] [place], [place] [address]   Lipid panel     Status: Abnormal   Collection Time: 05/25/20 11:49 PM  Result Value Ref Range   Cholesterol 187 0 - 200 mg/dL   Triglycerides 578 <469 mg/dL   HDL 55 >62 mg/dL   Total CHOL/HDL Ratio 3.4 RATIO   VLDL 22 0 - 40 mg/dL   LDL Cholesterol 952 (H) 0 - 99 mg/dL    Comment:        Total Cholesterol/HDL:CHD Risk Coronary Heart Disease Risk Table                     Men   Women  1/2 Average Risk   3.4   3.3  Average Risk       5.0   4.4  2 X Average Risk   9.6  7.1  3 X Average Risk  23.4   11.0        Use the calculated Patient Ratio above and the CHD Risk Table to determine the patient's CHD Risk.        ATP III CLASSIFICATION (LDL):  <100     mg/dL   Optimal  712-458  mg/dL   Near or Above                    Optimal  130-159  mg/dL   Borderline  099-833  mg/dL   High  >825     mg/dL   Very High Performed at [facility], [address], [place], [place] [address]   POC SARS Coronavirus 2 Ag     Status: None   Collection Time: 05/25/20 11:56 PM  Result Value Ref Range   SARS Coronavirus 2 Ag NEGATIVE NEGATIVE    Comment: (NOTE) SARS-CoV-2 antigen NOT DETECTED.   Negative results are presumptive.  Negative results do not preclude SARS-CoV-2 infection and should not be used as the sole basis for treatment or other patient management decisions, including infection   control decisions, particularly in the presence of clinical signs and  symptoms consistent with COVID-19, or in those who have been in contact with the virus.  Negative results must be combined with clinical observations, patient history, and epidemiological information. The expected result is Negative.  Fact Sheet for Patients: https://www.jennings-kim.com/  Fact Sheet for Healthcare Providers: https://alexander-rogers.biz/  This test is not yet approved or cleared by the [place] FDA and  has been authorized for detection and/or diagnosis of SARS-CoV-2 by FDA under an Emergency Use Authorization (EUA).  This EUA will remain in effect (meaning this test can be used) for the duration of  the COV ID-19 declaration under Section 564(b)(1) of the Act, 21 U.S.C. section 360bbb-3(b)(1), unless the authorization is terminated or revoked sooner.      Blood Alcohol level:  No results found for: [facility]  Metabolic Disorder Labs:  Lab Results  Component Value Date   HGBA1C 5.3 05/25/2020   MPG 105.41 05/25/2020   No results found for: PROLACTIN Lab Results  Component Value Date   CHOL 187 05/25/2020   TRIG 111 05/25/2020   HDL 55 05/25/2020   CHOLHDL 3.4 05/25/2020   VLDL 22 05/25/2020   LDLCALC 110 (H) 05/25/2020    Current Medications: Current Facility-Administered Medications  Medication Dose Route Frequency Provider Last Rate Last Admin  . acetaminophen (TYLENOL) tablet 650 mg  650 mg Oral Q6H PRN Antonieta Pert, MD      . alum & mag hydroxide-simeth (MAALOX/MYLANTA) 200-200-20 MG/5ML suspension 30 mL  30 mL Oral Q4H PRN Antonieta Pert, MD      . hydrOXYzine (ATARAX/VISTARIL) tablet 25 mg  25 mg Oral Q6H PRN Aldean Baker, NP   25 mg at 05/26/20 2213  . magnesium hydroxide (MILK OF MAGNESIA) suspension 30 mL  30 mL Oral Daily PRN Antonieta Pert, MD      . pantoprazole (PROTONIX) EC tablet 80 mg  80 mg Oral Daily Aldean Baker, NP    80 mg at 05/27/20 0929  . sertraline (ZOLOFT) tablet 25 mg  25 mg Oral Daily Mason Jim, Rhydian Baldi E, MD   25 mg at 05/27/20 1647  . traZODone (DESYREL) tablet 50 mg  50 mg Oral QHS PRN,MR X 1 Aldean Baker, NP   50 mg at 05/26/20 2213   PTA Medications:  Medications Prior to Admission  Medication Sig Dispense Refill Last Dose  . omeprazole (PRILOSEC) 40 MG capsule Take 1 capsule (40 mg total) by mouth daily before breakfast. 30 capsule 2   . Prenatal Vit-Fe Fumarate-FA (PRENATAL MULTIVITAMIN) TABS tablet Take 1 tablet by mouth daily.        Musculoskeletal: Strength & Muscle Tone: within normal limits Gait & Station: normal , steady Patient leans: N/A  Psychiatric Specialty Exam: Physical Exam Vitals reviewed.  HENT:     Head: Normocephalic.     Mouth/Throat:     Mouth: Mucous membranes are moist.  Eyes:     Extraocular Movements: Extraocular movements intact.  Cardiovascular:     Rate and Rhythm: Normal rate and regular rhythm.  Pulmonary:     Effort: Pulmonary effort is normal.     Breath sounds: Normal breath sounds.  Musculoskeletal:        General: Normal range of motion.  Skin:    General: Skin is warm and dry.  Neurological:     Mental Status: She is alert.     Comments: CN 3-12 grossly intact; intact finger to nose; equal grip     Review of Systems  Constitutional: Positive for fatigue. Negative for fever.  HENT: Negative.   Respiratory: Positive for shortness of breath.   Cardiovascular: Negative for chest pain.  Gastrointestinal: Positive for diarrhea. Negative for abdominal pain, nausea and vomiting.  Genitourinary: Negative for dysuria.  Musculoskeletal: Negative for arthralgias.  Skin: Negative for rash.  Neurological: Negative for headaches.    Blood pressure 125/85, pulse (!) 114, temperature 98 F (36.7 C), temperature source Oral, resp. rate 14, height 5' 1.5" (1.562 m), weight 107.7 kg, SpO2 100 %, not currently breastfeeding.Body mass index is 44.15  kg/m.  General Appearance: casually dressed, well engaged with interpreter and examiner; appears stated age, adequate hygiene  Eye Contact:  Good  Speech:  Speaks Spanish  Volume:  Normal  Mood:  Anxious and Dysphoric  Affect:  Constricted and Tearful  Thought Process:  Goal Directed and Linear  Orientation:  Full (Time, Place, and Person)  Thought Content:  Denies AVH, paranoia, or delusions; no obsessions/compulsions, delusions, or acute psychosis noted on exam  Suicidal Thoughts:  Denied at this time  Homicidal Thoughts:  Denied  Memory:  Recent;   Good  Judgement:  Intact  Insight:  Fair  Psychomotor Activity:  Normal  Concentration:  Concentration: Good  Recall:  Good  Fund of Knowledge:  Good  Language:  Spanish speaking - uses interpreter  Akathisia:  Negative  Assets:  Communication Skills Desire for Improvement Housing Resilience Social Support  ADL's:  Intact  Cognition:  WNL  Sleep:  Number of Hours: 4.5   Treatment Plan Summary: Diagnoses / Active Problems: MDD single episode severe without psychotic features Panic d/o GERD Breathing related sleep d/o  PLAN: 1. Safety and Monitoring:  -- Voluntary admission to inpatient psychiatric unit for safety, stabilization and treatment  -- Daily contact with patient to assess and evaluate symptoms and progress in treatment  -- Patient's case to be discussed in multi-disciplinary team meeting  -- Observation Level : q15 minute checks  -- Vital signs:  q12 hours  -- Precautions: suicide  2. Psychiatric Diagnoses and Treatment:   MDD single episode severe without psychotic features  Panic d/o -- Various antidepressant options for management of depression and panic d/o were discussed and patient consents to trial of Zoloft . The risks/benefits/side-effects/alternatives to this medication were discussed in  detail with the patient including risk of developing SI, risk of GI and sexual side-effects, and risk of  sedation, and time was given for questions.   -- Vistaril 25mg  tid PRN anxiety  -- Trazodone 50mg  po qhs PRN insomnia  -- Patient would benefit from CBT after discharge  -- Encouraged patient to participate in unit milieu and in scheduled group therapies   -- Short Term Goals: Ability to identify changes in lifestyle to reduce recurrence of condition will improve and Ability to identify and develop effective coping behaviors will improve  -- Long Term Goals: Improvement in symptoms so as ready for discharge   3. Medical Issues Being Addressed:   GERD  -- substitute Protonix 80mg  daily for home Omeprazole   Breathing related sleep d/o (OSA on CPAP)  -- patient to bring in CPAP from home for use while inpatient   Admission labs reviewed: CMP unremarkable other than ALT of 62, cholesterol 187, HDL 55, LDL 110, triglycerides 111, WBC 8.3, hemoglobin 15.1, hematocrit 46.1, platelets 333, hemoglobin A1c 5.3,  pregnancy test negative, TSH 1.836, respiratory panel negative, urine drug screen negative. Will recheck Hepatic function panel tomorrow. (Acute hepatitis panel nonreactive in November 2021).  4. Discharge Planning:   -- Social work and case management to assist with discharge planning and identification of hospital follow-up needs prior to discharge  -- Estimated LOS: 3-4 days  -- Discharge Concerns: Need to establish a safety plan; Medication compliance and effectiveness  -- Discharge Goals: Return home with outpatient referrals for mental health follow-up including medication management/psychotherapy  I certify that inpatient services furnished can reasonably be expected to improve the patient's condition.    Comer Locket, MD, Celene Skeen 3/4/20227:09 PM

## 2020-05-27 NOTE — Progress Notes (Signed)
Recreation Therapy Notes  Date: 05/27/2020 Time: 9:30a Location: [address] Dayroom  Group Topic: Stress Management  Goal Area(s) Addresses:  Patient will identify positive stress management techniques. Patient will identify benefits of using stress management post d/c.  Behavioral Response: Attentive  Intervention: Worksheet, Group brain storming  Activity :  Mind Map.  Patient was provided a blank template of a diagram with 32 blank boxes in a tiered system, branching from the center (similar to a bubble chart). LRT directed patients to label the middle of the diagram "Stress" and consider 8 different sources of stress in their day to day life. Pt were directed to record their stressors in the 2nd tier boxes closest to the center. Patients were to then come up with 3 effective techniques to address each identified area in the remaining boxes stemming from a particular stressor. Pts were encouraged to share ideas with one another and ask for suggestions of peers and Clinical research associate when stuck on a certain category of stress.  Education:  Stress Management, Discharge Planning.   Education Outcome: Acknowledges Education  Clinical Observations/Feedback:  Pt was quiet throughout group session but, observed to actively write as peers offered suggestions for particular stressors. Pt was on-task throughout activity and successfully filled in worksheet.    Nicholos Johns Horner, LRT/CTRS Benito Mccreedy Horner 05/27/2020, 1:04 PM

## 2020-05-28 DIAGNOSIS — F322 Major depressive disorder, single episode, severe without psychotic features: Secondary | ICD-10-CM | POA: Diagnosis not present

## 2020-05-28 LAB — HEPATIC FUNCTION PANEL
ALT: 44 U/L (ref 0–44)
AST: 25 U/L (ref 15–41)
Albumin: 3.9 g/dL (ref 3.5–5.0)
Alkaline Phosphatase: 91 U/L (ref 38–126)
Bilirubin, Direct: 0.1 mg/dL (ref 0.0–0.2)
Indirect Bilirubin: 0.7 mg/dL (ref 0.3–0.9)
Total Bilirubin: 0.8 mg/dL (ref 0.3–1.2)
Total Protein: 7.2 g/dL (ref 6.5–8.1)

## 2020-05-28 MED ORDER — SERTRALINE HCL 50 MG PO TABS
50.0000 mg | ORAL_TABLET | Freq: Every day | ORAL | Status: DC
  Administered 2020-05-29 – 2020-05-31 (×3): 50 mg via ORAL
  Filled 2020-05-28 (×5): qty 1

## 2020-05-28 NOTE — Progress Notes (Signed)
   05/28/20 2226  COVID-19 Daily Checkoff  Have you had a fever (temp > 37.80C/100F)  in the past 24 hours?  No  If you have had runny nose, nasal congestion, sneezing in the past 24 hours, has it worsened? No  COVID-19 EXPOSURE  Have you traveled outside the state in the past 14 days? No  Have you been in contact with someone with a confirmed diagnosis of COVID-19 or PUI in the past 14 days without wearing appropriate PPE? No  Have you been living in the same home as a person with confirmed diagnosis of COVID-19 or a PUI (household contact)? No  Have you been diagnosed with COVID-19? No

## 2020-05-28 NOTE — Progress Notes (Signed)
[facility] Group Notes:  (Nursing/MHT/Case Management/Adjunct)  Date:  05/28/2020  Time: 2015  Type of Therapy:  wrap up group  Participation Level:  Active  Participation Quality:  Appropriate, Attentive, Sharing and Supportive  Affect:  Depressed and Flat  Cognitive:  Alert  Insight:  Improving  Engagement in Group:  Engaged and Supportive  Modes of Intervention:  Clarification, Education and Support  Summary of Progress/Problems: Positive thinking and positive change were discussed.   Lisa Herrera S 05/28/2020, 9:04 PM

## 2020-05-28 NOTE — Progress Notes (Signed)
Adult Psychoeducational Group Note  Date:  05/28/2020 Time:  10:35 AM  Group Topic/Focus:  Dimensions of Wellness:   The focus of this group is to introduce the topic of wellness and discuss the role each dimension of wellness plays in total health.  Participation Level:  Active  Participation Quality:  Appropriate  Affect:  Appropriate  Cognitive:  Appropriate  Insight: Appropriate  Engagement in Group:  Engaged  Modes of Intervention:  Discussion  Additional Comments:   Patient attended morning orientation and Wellness group and participated.  Lisa Herrera 05/28/2020, 10:35 AM

## 2020-05-28 NOTE — Progress Notes (Signed)
   05/28/20 0640  Vital Signs  Temp 98.6 F (37 C)  Temp Source Oral  Pulse Rate (!) 112  BP (!) 126/92  BP Location Right Arm  BP Method Automatic  Patient Position (if appropriate) Sitting  Oxygen Therapy  SpO2 100 %   Interpreter #[id number] D: Patient admits to some SI but denies HI. Pt. Admits to 'imagining things that aren't there.' Pt. Rated anxiety 3/10 and depression 6/10.  Pt. Out in open areas. A:  Patient took scheduled medicine.  Support and encouragement provided Routine safety checks conducted every 15 minutes. Patient  Informed to notify staff with any concerns.  R:  Safety maintained.

## 2020-05-28 NOTE — Progress Notes (Signed)
   05/28/20 2228  Psych Admission Type (Psych Patients Only)  Admission Status Voluntary  Psychosocial Assessment  Patient Complaints Anxiety  Eye Contact Brief  Facial Expression Flat  Affect Appropriate to circumstance  Speech Soft  Interaction Forwards little  Motor Activity Other (Comment) (WDL)  Appearance/Hygiene Unremarkable  Behavior Characteristics Guarded  Mood Depressed  Thought Process  Coherency Circumstantial  Content WDL  Delusions None reported or observed  Perception WDL  Hallucination None reported or observed  Judgment Poor  Confusion None  Danger to Self  Current suicidal ideation? Denies  Self-Injurious Behavior No self-injurious ideation or behavior indicators observed or expressed   Agreement Not to Harm Self Yes  Description of Agreement verbal contract for safety  Danger to Others  Danger to Others None reported or observed

## 2020-05-28 NOTE — Progress Notes (Signed)
Lisa Herrera Progress Note  05/28/2020 7:44 AM Lisa Herrera  MRN:  [id number]   Chief Complaint: depression, anxiety and suicidal ideation  Subjective:  Lisa Herrera is a 25 y.o. female with no known past psychiatric history, who was initially admitted for inpatient psychiatric hospitalization on 05/26/2020 for management of panic attacks, worsening depression, and SI. The patient is currently on Hospital Day 2.   Chart Review from last 24 hours:  The patient's chart was reviewed and nursing notes were reviewed. The patient's case was discussed in multidisciplinary team meeting. Per nursing, yesterday morning the patient appeared depressed but denied SI or HI.  She attended groups. On night shift, the patient was visible in the milieu and reported passive SI. Per [facility] she was compliant with scheduled medications. She did not receive PRNs yesterday.  Information Obtained Today During Patient Interview: The patient was interviewed with in-person Spanish interpreter. The patient was seen and evaluated in her room. On assessment today the patient reports that she tolerated the start of Zoloft without N/V or worsening of her chronic, baseline diarrhea. She reports having had a "few seconds" of ringing in her ears on 2 occasions which have resolved but denies other side-effects with start of the antidepressant. She states that she had trouble falling asleep on the unit last night due to some ruminating thoughts. She reports stable appetite. When questioned about report from nurses that she "imagined things" that were not there overnight, she feels her statement was mis-interpreted. She states that she was awakened last night from a dream when staff made room checks, at which time she thought she heard the door open or thought she heard someone scream out on the hall. She denies true AVH, paranoia, delusions,or HI. She states that last night she had passing thoughts that she "wanted to die" without a specific plan but  she denies SI, intent or plan today. She can contract for safety. She states her anxiety is well managed at this time and she denies panic attacks on the unit. She continues to endorse some emotional blunting. Time was spent discussing her mild LFT elevation on admission and the need to f/u with her GI/PCP after discharge. She reports having been told she has fatty liver changes based on her recent GI workup.   Principal Problem: MDD (major depressive episode), single episode, severe, no psychosis (HCC) Diagnosis: Principal Problem:   MDD (major depressive episode), single episode, severe, no psychosis (HCC) Active Problems:   Gastroesophageal reflux disease with esophagitis without hemorrhage   Breathing-related sleep disorder   Panic disorder  Total Time Spent in Direct Patient Care:  I personally spent 30 minutes on the unit in direct patient care. The direct patient care time included face-to-face time with the patient, reviewing the patient's chart, communicating with other professionals, and coordinating care. Greater than 50% of this time was spent in counseling or coordinating care with the patient regarding goals of hospitalization, psycho-education, and discharge planning needs.  Past Psychiatric History:  Previous Psychiatric Diagnoses: diagnosed with anxiety and depression by PCP Current / Past Mental Health Providers: saw a therapist via video-chat for 2 sessions this year Previous Psychological Evaluations: presumably during video session with therapist Past Psychiatric Hospitalizations: Denied Past Suicide Attempts: Denied Past History of Homicidal Behaviors / Violent or Aggressive Behaviors: Denied Previous Participation in PHP/IOP or Residential Programs: Denied Past History of ECT / [employer] / VNS: Denied Past Psychotropic Medication Trials: Wellbutrin XL 150mg  for 2 doses and stopped due to side-effect  Past Medical History:  Past Medical History:  Diagnosis Date  . Alpha  thalassemia silent carrier 05/29/2019  . Gallstones   . Hepatic steatosis 02/04/2020  . Migraine   . Umbilical hernia without obstruction and without gangrene 02/04/2020    Past Surgical History:  Procedure Laterality Date  . CESAREAN SECTION N/A 01/14/2016   Procedure: CESAREAN SECTION;  Surgeon: Tereso Newcomer, Herrera;  Location: [facility];  Service: Obstetrics;  Laterality: N/A;  . CESAREAN SECTION N/A 11/17/2019   Procedure: CESAREAN SECTION;  Surgeon: Levie Heritage, DO;  Location: [facility] LD ORS;  Service: Obstetrics;  Laterality: N/A;  . CHOLECYSTECTOMY    . TUBAL LIGATION Bilateral 11/17/2019   Procedure: BILATERAL TUBAL LIGATION;  Surgeon: Levie Heritage, DO;  Location: [facility] LD ORS;  Service: Obstetrics;  Laterality: Bilateral;   Family History:  Family History  Problem Relation Age of Onset  . Breast cancer Maternal Aunt        50's  . Colon cancer Neg Hx   . Stomach cancer Neg Hx   . Esophageal cancer Neg Hx    Family Psychiatric  History: MGF with schizophrenia and brother with possible schizophrenia; no suicides or substance abuse in the family  Social History:  History of Physical / Emotional / Sexual Abuse: Denied Highest Level of Education Obtained: Graduated HS in the [place] Occupational History / Employment Status: Unemployed Marital Status / Relationship History: Married Parenting History: 35mo old son and 16 y/o daughter Living Situation: lives with husband and children either with mother or mother-in-law for past 6 months Spiritual History: Emergency planning/management officer Service: Denied 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  Sleep: Fair  Appetite:  Good  Current Medications: Current Facility-Administered Medications  Medication Dose Route Frequency Provider Last Rate Last Admin  . acetaminophen (TYLENOL) tablet 650 mg  650 mg Oral Q6H PRN Antonieta Pert, Herrera      . alum & mag hydroxide-simeth (MAALOX/MYLANTA) 200-200-20 MG/5ML suspension 30 mL  30 mL Oral Q4H PRN Antonieta Pert, Herrera      . hydrOXYzine (ATARAX/VISTARIL) tablet 25 mg  25 mg Oral Q6H PRN Aldean Baker, NP   25 mg at 05/26/20 2213  . magnesium hydroxide (MILK OF MAGNESIA) suspension 30 mL  30 mL Oral Daily PRN Antonieta Pert, Herrera      . pantoprazole (PROTONIX) EC tablet 80 mg  80 mg Oral Daily Aldean Baker, NP   80 mg at 05/27/20 0929  . sertraline (ZOLOFT) tablet 25 mg  25 mg Oral Daily Mason Jim, Amy E, Herrera   25 mg at 05/27/20 1647  . traZODone (DESYREL) tablet 50 mg  50 mg Oral QHS PRN,MR X 1 Aldean Baker, NP   50 mg at 05/26/20 2213   Lab Results: No results found for this or any previous visit (from the past 48 hour(s)).  Blood Alcohol level:  No results found for: [facility]  Metabolic Disorder Labs: Lab Results  Component Value Date   HGBA1C 5.3 05/25/2020   MPG 105.41 05/25/2020   No results found for: PROLACTIN Lab Results  Component Value Date   CHOL 187 05/25/2020   TRIG 111 05/25/2020   HDL 55 05/25/2020   CHOLHDL 3.4 05/25/2020   VLDL 22 05/25/2020   LDLCALC 110 (H) 05/25/2020   Physical Findings: AIMS: Facial and Oral Movements Muscles of Facial  Expression: None, normal Lips and Perioral Area: None, normal Jaw: None, normal Tongue: None, normal,Extremity Movements Upper (arms, wrists, hands, fingers): None, normal Lower (legs, knees, ankles, toes): None, normal, Trunk Movements Neck, shoulders, hips: None, normal, Overall Severity Severity of abnormal movements (highest score from questions above): None, normal Incapacitation due to abnormal movements: None, normal Patient's awareness of abnormal movements (rate only patient's report): No Awareness, Dental Status Current problems with teeth and/or dentures?: No Does patient usually wear dentures?: No   Musculoskeletal: Strength & Muscle Tone: within normal limits Gait & Station:  normal Patient leans: N/A  Psychiatric Specialty Exam: Physical Exam Vitals reviewed.  HENT:     Head: Normocephalic.  Pulmonary:     Effort: Pulmonary effort is normal.  Neurological:     Mental Status: She is alert.     Review of Systems  Respiratory: Negative for shortness of breath.   Cardiovascular: Negative for chest pain.  Gastrointestinal: Positive for diarrhea. Negative for abdominal pain, nausea and vomiting.  Neurological: Negative for headaches.    Blood pressure 115/80, pulse 97, temperature 98.6 F (37 C), temperature source Oral, resp. rate 14, height 5' 1.5" (1.562 m), weight 107.7 kg, SpO2 100 %, not currently breastfeeding.Body mass index is 44.15 kg/m.  General Appearance: causally dressed, well groomed, appears stated age; blue-green streaks in hair  Eye Contact:  Good  Speech:  Spanish speaking with interpreter  Volume:  Normal  Mood:  Anxious and Dysphoric  Affect:  Constricted and mildly anxious  Thought Process:  Coherent and Goal Directed  Orientation:  Full (Time, Place, and Person)  Thought Content:  Denies AVH, paranoia, delusions; no evidence of acute psychosis or obsession/compulsions on exam  Suicidal Thoughts:  Reports passive SI last night but denies current SI, intent or plan  Homicidal Thoughts:  Denied  Memory:  Immediate;   Good  Judgement:  Intact  Insight:  Fair  Psychomotor Activity:  Normal  Concentration:  Concentration: Good and Attention Span: Good  Recall:  [employer]  Fund of Knowledge:  Fair  Language:  spanish speaking with interpreter  Akathisia:  Negative  Assets:  Desire for Improvement Housing Resilience Social Support  ADL's:  Intact  Cognition:  WNL  Sleep:  Number of Hours: 6.25   Treatment Plan Summary: Diagnoses / Active Problems: MDD single episode severe without psychotic features Panic d/o GERD Breathing related sleep d/o  PLAN: 1. Safety and Monitoring:             -- Voluntary admission to inpatient  psychiatric unit for safety, stabilization and treatment             -- Daily contact with patient to assess and evaluate symptoms and progress in treatment             -- Patient's case to be discussed in multi-disciplinary team meeting             -- Observation Level : q15 minute checks             -- Vital signs:  q12 hours             -- Precautions: suicide  2. Psychiatric Diagnoses and Treatment:              MDD single episode severe without psychotic features             Panic d/o -- Continue Zoloft 25mg  titrating up tomorrow to 50mg  - patient agrees with this plan             --  Continue Vistaril 25mg  tid PRN anxiety             -- Continue Trazodone 50mg  po qhs PRN insomnia             -- Patient would benefit from CBT after discharge             -- Encouraged patient to participate in unit milieu and in scheduled group therapies              -- Short Term Goals: Ability to identify changes in lifestyle to reduce recurrence of condition will improve and Ability to identify and develop effective coping behaviors will improve             -- Long Term Goals: Improvement in symptoms so as ready for discharge              3. 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 Discharge Planning:              -- Social work and case management to assist with discharge planning and identification of hospital follow-up needs prior to discharge             -- Estimated LOS: 3-4 days             -- Discharge Concerns: Need to establish a safety plan; Medication compliance and effectiveness             -- Discharge Goals: Return home with outpatient  referrals for mental health follow-up including medication management/psychotherapy  I certify that inpatient services furnished can reasonably be expected to improve the patient's condition.     , Herrera, FAPA 05/28/2020, 7:44 AM

## 2020-05-29 DIAGNOSIS — F322 Major depressive disorder, single episode, severe without psychotic features: Secondary | ICD-10-CM | POA: Diagnosis not present

## 2020-05-29 MED ORDER — TRAZODONE HCL 50 MG PO TABS
50.0000 mg | ORAL_TABLET | Freq: Every day | ORAL | Status: DC
  Administered 2020-05-29 – 2020-06-01 (×2): 50 mg via ORAL
  Filled 2020-05-29 (×6): qty 1

## 2020-05-29 MED ORDER — CLONIDINE HCL 0.1 MG PO TABS
0.1000 mg | ORAL_TABLET | Freq: Once | ORAL | Status: AC
  Administered 2020-05-29: 0.1 mg via ORAL
  Filled 2020-05-29 (×2): qty 1

## 2020-05-29 NOTE — Progress Notes (Signed)
[facility] Group Notes:  (Nursing/MHT/Case Management/Adjunct)  Date:  05/29/2020  Time: 2030  Type of Therapy:  wrap up group  Participation Level:  Active  Participation Quality:  Appropriate, Attentive, Sharing and Supportive  Affect:  Depressed and Flat  Cognitive:  Appropriate  Insight:  Lacking  Engagement in Group:  Engaged  Modes of Intervention:  Clarification, Education and Support  Summary of Progress/Problems: Positive thinking and self-care were discussed.  Lisa Herrera 05/29/2020, 9:50 PM

## 2020-05-29 NOTE — Progress Notes (Signed)
Spanish interpretor line called after Patient raised concern in regards to her blood pressure. Per Patient " I do not have history of high blood pressure and I am very worried and very anxious because my blood pressure has been high for the past three days since I came here to the hospital"  Patient was reassured that the Provided was going to be notified. Patient stated that she thinks the medication which she has been taking is the one making her blood pressure high. Provider was notified and OTO of clonidine 0.1mg  PO was ordered and given to Patient. Prn Trazodone given for sleep and Vistaril for anxiety. Patient encouraged to use her CPAP machine to help her with restful sleep. Patient stated that she does not use her CPAP at night when she is home she only use it in the morning and in the afternoon. Patient still focused on blood pressure. Patient educated on blood pressure and  CPAP machine. Support and encouragement provided as needed.

## 2020-05-29 NOTE — Progress Notes (Addendum)
[facility] MD Progress Note  05/29/2020 12:42 PM Lisa Herrera  MRN:  [id number]   Chief Complaint: depression, anxiety and suicidal ideation  Subjective:  Lisa Herrera is a 25 y.o. female with no known past psychiatric history, who was initially admitted for inpatient psychiatric hospitalization on 05/26/2020 for management of panic attacks, worsening depression, and SI. The patient is currently on Hospital Day 3.   Chart Review from last 24 hours:  The patient's chart was reviewed and nursing notes were reviewed. The patient's case was discussed in multidisciplinary team meeting. Per nursing, the patient had some passive SI yesterday morning but contracted for safety on the unit. She had no SI on night shift and was calm and appropriate to situation on the unit. Per MAR she was compliant with scheduled medications and received no PRN medications.  Information Obtained Today During Patient Interview: The patient was interviewed with in-person Spanish interpreter. The patient was seen and evaluated on the unit. On assessment today the patient reports that she feels a "little anxious" today due to observation that her BP was mildly elevated today when staff checked it. She is ruminative today about her vitals and labs, and supportive reassurance was provided along with psychoeducation about her labs, medications, and vital sign readings. She states that last night she felt flushed in her arms and was worried this was a sign that she was about to have a panic attack. She states she usually has increased anxiety around her menses and believes her period is due to start. She did not let staff know about her anxiety and did not take any PRN medications for anxiety or sleep yesterday. We discussed that the hope is that as her Zoloft reaches a more therapeutic level that her anxiety and mood will continue to improve, but in the interim, she was encouraged to use PRN Vistaril for anxiety. She was not interested in adding  additional medications scheduled to her present regimen, and I advised that to avoid medication side-effects we cannot rapidly titrate her Zoloft. She did not use her CPAP overnight and was encouraged to be compliant with CPAP. She denies SI, HI, AVH, paranoia or delusions. She states she feels less emotionally blunted today and feels less depressed today. She describes feeling emotions today related to missing her children which she takes as a positive sign. She denies medication side-effects today and specifically denies GI symptoms or tinnitus. She had trouble falling asleep last night due to ruminating thoughts, and we discussed scheduling her Trazodone for help with sleep which she agrees to. We discussed the results of her LFT panel which was normal. She was able to identify that playing the Mandolin or Guitar helps reduce her anxiety and she was offered the chance to use the guitar on the unit for stress relief.  Principal Problem: MDD (major depressive episode), single episode, severe, no psychosis (HCC) Diagnosis: Principal Problem:   MDD (major depressive episode), single episode, severe, no psychosis (HCC) Active Problems:   Gastroesophageal reflux disease with esophagitis without hemorrhage   Breathing-related sleep disorder   Panic disorder  Total Time Spent in Direct Patient Care:  I personally spent 30 minutes on the unit in direct patient care. The direct patient care time included face-to-face time with the patient, reviewing the patient's chart, communicating with other professionals, and coordinating care. Greater than 50% of this time was spent in counseling or coordinating care with the patient regarding goals of hospitalization, psycho-education, and discharge planning needs.  Past Psychiatric History:  Previous Psychiatric Diagnoses: diagnosed with anxiety and depression by PCP Current / Past Mental Health Providers: saw a therapist via video-chat for 2 sessions this  year Previous Psychological Evaluations: presumably during video session with therapist Past Psychiatric Hospitalizations: Denied Past Suicide Attempts: Denied Past History of Homicidal Behaviors / Violent or Aggressive Behaviors: Denied Previous Participation in PHP/IOP or Residential Programs: Denied Past History of ECT / [employer] / VNS: Denied Past Psychotropic Medication Trials: Wellbutrin XL  for 2 doses and stopped due to side-effect  Past Medical History:  Past Medical History:  Diagnosis Date  . Alpha thalassemia silent carrier 05/29/2019  . Gallstones   . Hepatic steatosis 02/04/2020  . Migraine   . Umbilical hernia without obstruction and without gangrene 02/04/2020    Past Surgical History:  Procedure Laterality Date  . CESAREAN SECTION N/A 01/14/2016   Procedure: CESAREAN SECTION;  Surgeon: Tereso Newcomer, MD;  Location: [facility];  Service: Obstetrics;  Laterality: N/A;  . CESAREAN SECTION N/A 11/17/2019   Procedure: CESAREAN SECTION;  Surgeon: Levie Heritage, DO;  Location: [facility] LD ORS;  Service: Obstetrics;  Laterality: N/A;  . CHOLECYSTECTOMY    . TUBAL LIGATION Bilateral 11/17/2019   Procedure: BILATERAL TUBAL LIGATION;  Surgeon: Levie Heritage, DO;  Location: [facility] LD ORS;  Service: Obstetrics;  Laterality: Bilateral;   Family History:  Family History  Problem Relation Age of Onset  . Breast cancer Maternal Aunt        50's  . Colon cancer Neg Hx   . Stomach cancer Neg Hx   . Esophageal cancer Neg Hx    Family Psychiatric  History: MGF with schizophrenia and brother with possible schizophrenia; no suicides or substance abuse in the family  Social History:  History of Physical / Emotional / Sexual Abuse: Denied Highest Level of Education Obtained: Graduated HS in the [place] Occupational History / Employment Status: Unemployed Marital Status / Relationship History: Married Parenting History: 16mo old son and 39 y/o daughter Living Situation: lives with  husband and children either with mother or mother-in-law for past 6 months Spiritual History: Emergency planning/management officer Service: Denied 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  Sleep: Fair  Appetite:  Good  Current Medications: Current Facility-Administered Medications  Medication Dose Route Frequency Provider Last Rate Last Admin  . acetaminophen (TYLENOL) tablet 650 mg  650 mg Oral Q6H PRN Antonieta Pert, MD      . alum & mag hydroxide-simeth (MAALOX/MYLANTA) 200-200-20 MG/5ML suspension 30 mL  30 mL Oral Q4H PRN Antonieta Pert, MD      . hydrOXYzine (ATARAX/VISTARIL) tablet 25 mg  25 mg Oral Q6H PRN Aldean Baker, NP   25 mg at 05/26/20 2213  . magnesium hydroxide (MILK OF MAGNESIA) suspension 30 mL  30 mL Oral Daily PRN Antonieta Pert, MD      . pantoprazole (PROTONIX) EC tablet 80 mg  80 mg Oral Daily Aldean Baker, NP   80 mg at 05/29/20 0919  . sertraline (ZOLOFT) tablet 50 mg  50 mg Oral Daily Bartholomew Crews E, MD   50 mg at 05/29/20 0919  . traZODone (DESYREL) tablet 50 mg  50 mg Oral QHS PRN,MR X 1 Aldean Baker, NP   50 mg at 05/26/20 2213   Lab Results:  Results for orders placed or performed during the hospital encounter of 05/26/20 (  from the past 48 hour(s))  Hepatic function panel     Status: None   Collection Time: 05/28/20  6:49 AM  Result Value Ref Range   Total Protein 7.2 6.5 - 8.1 g/dL   Albumin 3.9 3.5 - 5.0 g/dL   AST 25 15 - 41 U/L   ALT 44 0 - 44 U/L   Alkaline Phosphatase 91 38 - 126 U/L   Total Bilirubin 0.8 0.3 - 1.2 mg/dL   Bilirubin, Direct 0.1 0.0 - 0.2 mg/dL   Indirect Bilirubin 0.7 0.3 - 0.9 mg/dL    Comment: Performed at [facility], [address], [place], [place] [address]    Blood Alcohol level:  No results found for: [facility]  Metabolic Disorder Labs: Lab Results  Component Value Date   HGBA1C  5.3 05/25/2020   MPG 105.41 05/25/2020   No results found for: PROLACTIN Lab Results  Component Value Date   CHOL 187 05/25/2020   TRIG 111 05/25/2020   HDL 55 05/25/2020   CHOLHDL 3.4 05/25/2020   VLDL 22 05/25/2020   LDLCALC 110 (H) 05/25/2020   Physical Findings: AIMS: Facial and Oral Movements Muscles of Facial Expression: None, normal Lips and Perioral Area: None, normal Jaw: None, normal Tongue: None, normal,Extremity Movements Upper (arms, wrists, hands, fingers): None, normal Lower (legs, knees, ankles, toes): None, normal, Trunk Movements Neck, shoulders, hips: None, normal, Overall Severity Severity of abnormal movements (highest score from questions above): None, normal Incapacitation due to abnormal movements: None, normal Patient's awareness of abnormal movements (rate only patient's report): No Awareness, Dental Status Current problems with teeth and/or dentures?: No Does patient usually wear dentures?: No   Musculoskeletal: Strength & Muscle Tone: within normal limits Gait & Station: normal Patient leans: N/A  Psychiatric Specialty Exam: Physical Exam Vitals reviewed.  HENT:     Head: Normocephalic.  Pulmonary:     Effort: Pulmonary effort is normal.  Neurological:     Mental Status: She is alert.     Review of Systems  Respiratory: Negative for shortness of breath.   Cardiovascular: Negative for chest pain.  Gastrointestinal: Negative for nausea and vomiting.  Neurological: Negative for headaches.    Blood pressure 115/71, pulse 87, temperature 98.5 F (36.9 C), temperature source Oral, resp. rate 18, height 5' 1.5" (1.562 m), weight 107.7 kg, SpO2 100 %, not currently breastfeeding.Body mass index is 44.15 kg/m.  General Appearance: causally dressed, well groomed, appears stated age; blue-green streaks in hair  Eye Contact:  Good  Speech:  Spanish speaking with interpreter  Volume:  Normal  Mood:  Anxious and Dysphoric  Affect:  Constricted  and mildly anxious  Thought Process:  Goal directed but ruminative about vitals and labs  Orientation:  Full (Time, Place, and Person)  Thought Content:  Denies AVH, paranoia, delusions; no evidence of acute psychosis on exam  Suicidal Thoughts: Denied  Homicidal Thoughts:  Denied  Memory:  Immediate;   Good  Judgement:  Intact  Insight:  Fair  Psychomotor Activity:  Normal  Concentration:  Concentration: Good and Attention Span: Good  Recall:  [employer] of Knowledge:  Fair  Language:  spanish speaking with interpreter  Akathisia:  Negative  Assets:  Desire for Improvement Housing Resilience Social Support  ADL's:  Intact  Cognition:  WNL  Sleep:  Number of Hours: 5.5   Treatment Plan Summary: Diagnoses / Active Problems: MDD single episode severe without psychotic features Panic d/o GERD Breathing related sleep d/o  PLAN:  1. Safety and Monitoring:             -- Voluntary admission to inpatient psychiatric unit for safety, stabilization and treatment             -- Daily contact with patient to assess and evaluate symptoms and progress in treatment             -- Patient's case to be discussed in multi-disciplinary team meeting             -- Observation Level : q15 minute checks             -- Vital signs:  q12 hours             -- Precautions: suicide  2. Psychiatric Diagnoses and Treatment:              MDD single episode severe without psychotic features             Panic d/o -- Increase Zoloft to 50mg  today for residual mood symptoms             -- Continue Vistaril 25mg  tid PRN anxiety             -- Schedule Trazodone 50mg  po qhs insomnia             -- Patient would benefit from CBT after discharge  -- Chaplain consult placed per patient request              -- Encouraged patient to participate in unit milieu and in scheduled group therapies              -- Short Term Goals: Ability to identify changes in lifestyle to reduce recurrence of condition will  improve and Ability to identify and develop effective coping behaviors will improve             -- Long Term Goals: Improvement in symptoms so as ready for discharge              3. Medical Issues Being Addressed:              GERD             -- Continue Protonix 80mg  daily as substitute for home Omeprazole              Breathing related sleep d/o (OSA on CPAP)             -- CPAP from home for use while inpatient               ALT of 62 - resolved  -- Acute hepatitis panel nonreactive in November 2021  -- Repeat hepatic function panel WNL (AST 25, ALT 44) and patient made aware of results  4. Discharge Planning:              -- Social work and case management to assist with discharge planning and identification of hospital follow-up needs prior to discharge             -- Estimated LOS: 2-3 days             -- Discharge Concerns: Need to establish a safety plan; Medication compliance and effectiveness             -- Discharge Goals: Return home with outpatient referrals for mental health follow-up including medication management/psychotherapy  I certify that inpatient services furnished can reasonably be expected to improve the patient's condition.  Comer Locket, MD, FAPA 05/29/2020, 12:42 PM

## 2020-05-29 NOTE — Progress Notes (Signed)
   05/29/20 2120 05/29/20 2122  Vital Signs  Temp  --  97.9 F (36.6 C)  Temp Source  --  Oral  Pulse Rate 63 77  Pulse Rate Source  --  Dinamap  Resp  --  18  BP (!) 155/97 (!) 170/99  BP Location Right Arm Right Arm  BP Method Automatic Automatic  Patient Position (if appropriate) Sitting Standing

## 2020-05-29 NOTE — Progress Notes (Signed)
Notified by [facility] adult unit nursing staff that the patient's blood pressure continues to be elevated (155/97 mmHg Sitting, 170/99 mmHg Standing on recent re-check at 2120).  Per nursing, patient complaining of slight dizziness, but denies any other physical symptoms at this time and nursing reports that the patient is appearing paranoid and fixated on her high blood pressure, which is making her more anxious.  Per nursing, patient's [facility] day treatment team is already aware of the patient's high blood pressure. Per nursing, patient has her CPAP with her in her room on the [facility] adult unit, but is not using it at this time due to her anxiety regarding her high blood pressure and nursing believes that patient's blood pressure may be elevated due to the patient not currently using her CPAP.  Do not believe that the patient's elevated blood pressure is a medical emergency at this time and will attempt to address the patient's blood pressure tonight while the patient is at [facility]. Per chart review, patient not taking any home medications for hypertension.  One-time dose of clonidine 0.1 mg p.o. ordered to attempt to lower the patient's blood pressure.  Recommend that [facility] nursing staff attempt to encourage the patient to use her CPAP to try to get some sleep tonight.  Also recommend that [facility] nursing staff continue to monitor the patient to ensure that she is not getting up out of bed too frequently due to her reported slight dizziness. [facility] nursing staff verbalizes understanding and agreement of the overall plan.  Support and reassurance provided to the patient by [facility] nursing staff.  [facility] nursing staff to notify the dayshift treatment team of the patient's continued elevated blood pressure upon shift change on the morning of May 30, 2020 so that potential future adjustments can be made to the patient's treatment plan/medication regimen if necessary. [facility] nursing staff to contact me if the patient has any additional issues or needs  any additional assistance.

## 2020-05-29 NOTE — Progress Notes (Addendum)
   05/29/20 1600  Psych Admission Type (Psych Patients Only)  Admission Status Voluntary  Psychosocial Assessment  Patient Complaints Anxiety  Eye Contact Brief  Facial Expression Flat  Affect Appropriate to circumstance  Speech Soft  Interaction Forwards little  Motor Activity Other (Comment) (WDL)  Appearance/Hygiene Unremarkable  Behavior Characteristics Cooperative;Anxious;Guarded  Mood Depressed;Anxious  Aggressive Behavior  Effect No apparent injury  Thought Process  Coherency Circumstantial  Content WDL  Delusions None reported or observed  Perception WDL  Hallucination None reported or observed  Judgment Poor  Confusion None  Danger to Self  Current suicidal ideation? Denies  Self-Injurious Behavior No self-injurious ideation or behavior indicators observed or expressed   Agreement Not to Harm Self Yes  Description of Agreement verbal contract for safety  Danger to Others  Danger to Others None reported or observed    D. Pt presents with an an anxious affect/mood- calm, cooperative but  guarded behavior. Per pt's self inventory, pt rated her anxiety a 9/10, but  refuses her prn Vistaril for anxiety, reporting that she wishes to wait for the Zoloft to take effect. Pt observed in the dayroom throughout the shift,  and attending rec therapy outside with peersl Pt currently denies SI/HI and AVH A. Labs and vitals monitored. Pt given and educated on medications. Pt supported emotionally and encouraged to express concerns and ask questions.   R. Pt remains safe with 15 minute checks. Will continue POC.

## 2020-05-29 NOTE — [facility] Group Notes (Signed)
The focus of this group is to help patients establish daily goals to achieve during treatment and discuss how the patient can incorporate goal setting into their daily lives to aide in recovery.  Pt did not attend group 

## 2020-05-29 NOTE — Progress Notes (Signed)
[place] NOVEL CORONAVIRUS (COVID-19) DAILY CHECK-OFF SYMPTOMS - answer yes or no to each - every day NO YES  Have you had a fever in the past 24 hours?  . Fever (Temp > 37.80C / 100F) X   Have you had any of these symptoms in the past 24 hours? . New Cough .  Sore Throat  .  Shortness of Breath .  Difficulty Breathing .  Unexplained Body Aches   X   Have you had any one of these symptoms in the past 24 hours not related to allergies?   . Runny Nose .  Nasal Congestion .  Sneezing   X   If you have had runny nose, nasal congestion, sneezing in the past 24 hours, has it worsened?  X   EXPOSURES - check yes or no X   Have you traveled outside the state in the past 14 days?  X   Have you been in contact with someone with a confirmed diagnosis of COVID-19 or PUI in the past 14 days without wearing appropriate PPE?  X   Have you been living in the same home as a person with confirmed diagnosis of COVID-19 or a PUI (household contact)?    X   Have you been diagnosed with COVID-19?    X              What to do next: Answered NO to all: Answered YES to anything:   Proceed with unit schedule Follow the [facility] Inpatient Flowsheet.   

## 2020-05-29 NOTE — [facility] Group Notes (Signed)
LCSW Group Therapy Note  05/29/2020   10:45-11:43am   Type of Therapy and Topic:  Group Therapy: Anger Cues and Responses  Participation Level:  Active   Description of Group:   In this group, patients learned how to recognize the physical, cognitive, emotional, and behavioral responses they have to anger-provoking situations.  They identified a recent time they became angry and how they reacted.  They analyzed how their reaction was possibly beneficial and how it was possibly unhelpful.  The group discussed a variety of healthier coping skills that could help with such a situation in the future.  Focus was placed on how helpful it is to recognize the underlying emotions to our anger, because working on those can lead to a more permanent solution as well as our ability to focus on the important rather than the urgent.  Therapeutic Goals: 1. Patients will remember their last incident of anger and how they felt emotionally and physically, what their thoughts were at the time, and how they behaved. 2. Patients will identify how their behavior at that time worked for them, as well as how it worked against them. 3. Patients will explore possible new behaviors to use in future anger situations. 4. Patients will learn that anger itself is normal and cannot be eliminated, and that healthier reactions can assist with resolving conflict rather than worsening situations.  Summary of Patient Progress:  The patient was present but declined to share. There were times in group pt would nod her head as if agreeing with what others were saying. Did appear to be actively listening.   Therapeutic Modalities:   Cognitive Behavioral Therapy  Shellia Cleverly

## 2020-05-30 DIAGNOSIS — F322 Major depressive disorder, single episode, severe without psychotic features: Secondary | ICD-10-CM | POA: Diagnosis not present

## 2020-05-30 MED ORDER — ADULT MULTIVITAMIN W/MINERALS CH
1.0000 | ORAL_TABLET | Freq: Every day | ORAL | Status: DC
  Administered 2020-05-30 – 2020-06-02 (×4): 1 via ORAL
  Filled 2020-05-30 (×6): qty 1

## 2020-05-30 MED ORDER — CLONIDINE HCL 0.1 MG PO TABS: 0.1000 mg | ORAL_TABLET | Freq: Three times a day (TID) | ORAL | Status: DC | PRN

## 2020-05-30 NOTE — Progress Notes (Signed)
Pt presents with anxiety today.  Denies SI/HI/AVH.  Pt preoccupied with blood pressure readings.  Latest blood pressure at 1517 is 123/74 pulse 65.  Pt is participating in groups and interacting with peers on the unit.  Pt took medications without incident and no adverse effects were observed.  Pt remains safe with q 15 min checks in place.

## 2020-05-30 NOTE — Progress Notes (Signed)
NUTRITION ASSESSMENT RD working remotely.  Pt identified as at risk on the Malnutrition Screen Tool  INTERVENTION: - will order 1 tablet multivitamin with minerals/day. [facility] staff to continue to encourage PO intakes of meals and snacks.   NUTRITION DIAGNOSIS: Unintentional weight loss related to sub-optimal intake as evidenced by pt report.   Goal: Pt to meet >/= 90% of their estimated nutrition needs.  Monitor:  PO intake  Assessment:  Patient noted to be Spanish-speaking and requires the use of an interpretor.   Patient admitted for depression/MDD and SI. She began having panic attacks ~13 months ago.   She had a baby boy in 10/2019 and notes indicate that patient denied any symptoms of post-partum mood changes.   Weight on 3/3 was 237 lb and PTA the most recently documented weight was on 03/24/20 when she weighed 249 lb. This indicates 12 lb weight loss (4.8% body weight) in the past 2 months; not significant for time frame.   25 y.o. female  Height: Ht Readings from Last 1 Encounters:  05/26/20 5' 1.5" (1.562 m)    Weight: Wt Readings from Last 1 Encounters:  05/26/20 107.7 kg    Weight Hx: Wt Readings from Last 10 Encounters:  05/26/20 107.7 kg  03/24/20 113.4 kg  03/02/20 114.8 kg  02/26/20 114.8 kg  02/22/20 114.8 kg  02/09/20 114.3 kg  02/04/20 114.3 kg  01/23/20 115.7 kg  01/01/20 113.4 kg  12/11/19 116.1 kg    BMI:  Body mass index is 44.15 kg/m. Pt meets criteria for obesity based on current BMI.  Estimated Nutritional Needs: Kcal: 25-30 kcal/kg Protein: > 1 gram protein/kg Fluid: 1 ml/kcal  Diet Order:  Diet Order            Diet regular Room service appropriate? Yes; Fluid consistency: Thin  Diet effective now                Pt is also offered choice of unit snacks mid-morning and mid-afternoon.  Pt is eating as desired.   Lab results and medications reviewed.     Trenton Gammon, MS, RD, LDN, CNSC Inpatient Clinical  Dietitian RD pager # available in [facility]  After hours/weekend pager # available in [facility]

## 2020-05-30 NOTE — Progress Notes (Signed)
[facility] MD Progress Note  05/30/2020 3:08 PM Lisa Herrera  MRN:  [id number]   Chief Complaint: depression, anxiety and suicidal ideation  Subjective:  Lisa Herrera is a 25 y.o. female with no known past psychiatric history, who was initially admitted for inpatient psychiatric hospitalization on 05/26/2020 for management of panic attacks, worsening depression, and SI. The patient is currently on Hospital Day 4.   Chart Review from last 24 hours:  The patient's chart was reviewed and nursing notes were reviewed. The patient's case was discussed in multidisciplinary team meeting. Per nursing, the patient was ruminative about her BP yesterday evening and received 1X dose of Clonidine 0.1mg  by overnight provider for BP 170/99. She was reportedly very anxious about her BP reading and required reassurance by staff. No safety concerns were noted per staff. Per [facility] she was compliant with scheduled medications. She did receive Vistaril X1 for anxiety yesterday.  Information Obtained Today During Patient Interview: The patient was interviewed with in-person Spanish interpreter. The patient was seen and evaluated on the unit. On assessment today the patient reports that she is very concerned about her blood pressure readings. She states she had talked to family while visiting in [place] about various sensations of flushing and headaches she had been having, and a family member who is a Engineer, civil (consulting), suggested that she have her BP monitored. She states she was previously having her BP monitored during pregnancy for possible pre-eclampsia. Time was spent discussing that HTN cannot be diagnosed with isolated readings, and she was encouraged to monitor her BP at home with a home cuff, record the readings, and talk to her PCP after discharge about her blood pressure. We discussed the mind-body connection and how her anxiety and ruminating thoughts are likely contributing to transient BP elevation on the unit. She is currently  asymptomatic but stated she would feel more reassured if we checked her BP more frequently during the day. She was again encouraged to use PRN Vistaril if she feels anxious. She states that she is feeling less emotionally numb and is missing her children and feeling more motivated compared to when she was first admitted. She discussed worry that her family perceives her as "lazy or crazy" based on comments they made prior to admission. We discussed the biology of depression and the cultural barriers she is facing in trying to get her family to understand that her depression is a biologic condition. She states she has not talked to family on the phone during this admission due to fear of they being critical of her. She was encouraged to make calls while here so she has time to process any emotions in a safe place if the calls are difficult. The cycle of negative cognitive distortions leading to emotions that then trigger actions was discussed in detail. She admits that last night while ruminating about what she will do after discharge she had passing SI without intent or plan. She was challenged on her catastrophic thinking and negative self-talk. She denies SI today and can contract for safety on the unit. She denies HI, AVH, paranoia, or delusions. She denies medication side-effects. She states she slept much better last night with use of Trazodone. Time was given for questions. She was encouraged to use CPAP.  Principal Problem: MDD (major depressive episode), single episode, severe, no psychosis (HCC) Diagnosis: Principal Problem:   MDD (major depressive episode), single episode, severe, no psychosis (HCC) Active Problems:   Gastroesophageal reflux disease with esophagitis without hemorrhage   Breathing-related  sleep disorder   Panic disorder  Total Time Spent in Direct Patient Care:  I personally spent 40 minutes on the unit in direct patient care. The direct patient care time included face-to-face  time with the patient, reviewing the patient's chart, communicating with other professionals, and coordinating care. Greater than 50% of this time was spent in counseling or coordinating care with the patient regarding goals of hospitalization, psycho-education, and discharge planning needs.  Past Psychiatric History:  Previous Psychiatric Diagnoses: diagnosed with anxiety and depression by PCP Current / Past Mental Health Providers: saw a therapist via video-chat for 2 sessions this year Previous Psychological Evaluations: presumably during video session with therapist Past Psychiatric Hospitalizations: Denied Past Suicide Attempts: Denied Past History of Homicidal Behaviors / Violent or Aggressive Behaviors: Denied Previous Participation in PHP/IOP or Residential Programs: Denied Past History of ECT / [employer] / VNS: Denied Past Psychotropic Medication Trials: Wellbutrin XL 150mg  for 2 doses and stopped due to side-effect  Past Medical History:  Past Medical History:  Diagnosis Date  . Alpha thalassemia silent carrier 05/29/2019  . Gallstones   . Hepatic steatosis 02/04/2020  . Migraine   . Umbilical hernia without obstruction and without gangrene 02/04/2020    Past Surgical History:  Procedure Laterality Date  . CESAREAN SECTION N/A 01/14/2016   Procedure: CESAREAN SECTION;  Surgeon: 01/16/2016, MD;  Location: [facility];  Service: Obstetrics;  Laterality: N/A;  . CESAREAN SECTION N/A 11/17/2019   Procedure: CESAREAN SECTION;  Surgeon: 11/19/2019, DO;  Location: [facility] LD ORS;  Service: Obstetrics;  Laterality: N/A;  . CHOLECYSTECTOMY    . TUBAL LIGATION Bilateral 11/17/2019   Procedure: BILATERAL TUBAL LIGATION;  Surgeon: 11/19/2019, DO;  Location: [facility] LD ORS;  Service: Obstetrics;  Laterality: Bilateral;   Family History:  Family History  Problem Relation Age of Onset  . Breast cancer Maternal Aunt        50's  . Colon cancer Neg Hx   . Stomach cancer Neg Hx    . Esophageal cancer Neg Hx    Family Psychiatric  History: MGF with schizophrenia and brother with possible schizophrenia; no suicides or substance abuse in the family  Social History:  History of Physical / Emotional / Sexual Abuse: Denied Highest Level of Education Obtained: Graduated HS in the [employer] Occupational History / Employment Status: Unemployed Marital Status / Relationship History: Married Parenting History: 18mo old son and 90 y/o daughter Living Situation: lives with husband and children either with mother or mother-in-law for past 6 months Spiritual History: 10 Service: Denied Current / Pending / Emergency planning/management officer or Previous Museum/gallery conservator / Prison Time: Denied Access to Firearms: Denied Substance Abuse History in the last 12 months: No.  Sleep: Good  Appetite:  Good  Current Medications: Current Facility-Administered Medications  Medication Dose Route Frequency Provider Last Rate Last Admin  . acetaminophen (TYLENOL) tablet 650 mg  650 mg Oral Q6H PRN Biomedical engineer, MD      . alum & mag hydroxide-simeth (MAALOX/MYLANTA) 200-200-20 MG/5ML suspension 30 mL  30 mL Oral Q4H PRN 10-02-2000, MD      . hydrOXYzine (ATARAX/VISTARIL) tablet 25 mg  25 mg Oral Q6H PRN Antonieta Pert, NP   25 mg at 05/29/20 2135  . magnesium hydroxide (MILK OF MAGNESIA) suspension 30 mL  30 mL Oral Daily PRN 2136, MD      . multivitamin with minerals tablet 1 tablet  1 tablet  Oral Daily Antonieta Pertlary, Greg Lawson, MD   1 tablet at 05/30/20 1024  . pantoprazole (PROTONIX) EC tablet 80 mg  80 mg Oral Daily Aldean BakerSykes, Janet E, NP   80 mg at 05/30/20 1023  . sertraline (ZOLOFT) tablet 50 mg  50 mg Oral Daily Mason JimSingleton, Amy E, MD   50 mg at 05/30/20 1024  . traZODone (DESYREL) tablet 50 mg  50 mg Oral QHS Bartholomew CrewsSingleton, Amy E, MD   50 mg at 05/29/20 2134   Lab Results:  No results found for this or any previous visit (from the past 48 hour(s)).  Blood Alcohol level:  No results  found for: [facility]  Metabolic Disorder Labs: Lab Results  Component Value Date   HGBA1C 5.3 05/25/2020   MPG 105.41 05/25/2020   No results found for: PROLACTIN Lab Results  Component Value Date   CHOL 187 05/25/2020   TRIG 111 05/25/2020   HDL 55 05/25/2020   CHOLHDL 3.4 05/25/2020   VLDL 22 05/25/2020   LDLCALC 110 (H) 05/25/2020   Physical Findings: AIMS: Facial and Oral Movements Muscles of Facial Expression: None, normal Lips and Perioral Area: None, normal Jaw: None, normal Tongue: None, normal,Extremity Movements Upper (arms, wrists, hands, fingers): None, normal Lower (legs, knees, ankles, toes): None, normal, Trunk Movements Neck, shoulders, hips: None, normal, Overall Severity Severity of abnormal movements (highest score from questions above): None, normal Incapacitation due to abnormal movements: None, normal Patient's awareness of abnormal movements (rate only patient's report): No Awareness, Dental Status Current problems with teeth and/or dentures?: No Does patient usually wear dentures?: No   Musculoskeletal: Strength & Muscle Tone: within normal limits Gait & Station: normal Patient leans: N/A  Psychiatric Specialty Exam: Physical Exam Vitals reviewed.  HENT:     Head: Normocephalic.  Pulmonary:     Effort: Pulmonary effort is normal.  Neurological:     Mental Status: She is alert.     Review of Systems  Respiratory: Negative for shortness of breath.   Cardiovascular: Negative for chest pain.  Gastrointestinal: Negative for nausea and vomiting.    Blood pressure 127/82, pulse (!) 116, temperature 98 F (36.7 C), temperature source Oral, resp. rate 18, height 5' 1.5" (1.562 m), weight 107.7 kg, SpO2 99 %, not currently breastfeeding.Body mass index is 44.15 kg/m.  General Appearance: causally dressed, well groomed, appears stated age; blue-green streaks in hair  Eye Contact:  Good  Speech:  Spanish speaking with interpreter  Volume:  Normal   Mood:  Anxious and Dysphoric  Affect:  Anxious, at times tearful  Thought Process:  Goal directed but ruminative   Orientation:  Full (Time, Place, and Person)  Thought Content:  Denies AVH, paranoia, delusions; no evidence of acute psychosis on exam  Suicidal Thoughts: Passive SI last night - denied today  Homicidal Thoughts:  Denied  Memory:  Immediate;   Good  Judgement:  Intact  Insight:  Fair  Psychomotor Activity:  Normal  Concentration:  Concentration: Good and Attention Span: Good  Recall:  [employer]  Fund of Knowledge:  Fair  Language:  spanish speaking with interpreter  Akathisia:  Negative  Assets:  Desire for Improvement Housing Resilience Social Support  ADL's:  Intact  Cognition:  WNL  Sleep:  Number of Hours: 6.5   Treatment Plan Summary: Diagnoses / Active Problems: MDD single episode severe without psychotic features Panic d/o GERD Breathing related sleep d/o  PLAN: 1. Safety and Monitoring:             --  Voluntary admission to inpatient psychiatric unit for safety, stabilization and treatment             -- Daily contact with patient to assess and evaluate symptoms and progress in treatment             -- Patient's case to be discussed in multi-disciplinary team meeting             -- Observation Level : q15 minute checks             -- Vital signs:  q12 hours             -- Precautions: suicide  2. Psychiatric Diagnoses and Treatment:              MDD single episode severe without psychotic features             Panic d/o -- Continue Zoloft 50mg  for residual mood symptoms - titrating up as an outpatient             -- Continue Vistaril 25mg  tid PRN anxiety and encouraged patient to use PRN             -- Continue Trazodone 50mg  po qhs insomnia             -- Patient would benefit from CBT after discharge  -- Chaplain consult placed per patient request              -- Encouraged patient to participate in unit milieu and in scheduled group therapies               -- Short Term Goals: Ability to identify changes in lifestyle to reduce recurrence of condition will improve and Ability to identify and develop effective coping behaviors will improve             -- Long Term Goals: Improvement in symptoms so as ready for discharge              3. Medical Issues Being Addressed:              GERD             -- Continue Protonix 80mg  daily as substitute for home Omeprazole              Breathing related sleep d/o (OSA on CPAP)             -- CPAP from home for use while inpatient               ALT of 62 - resolved  -- Acute hepatitis panel nonreactive in November 2021  -- Repeat hepatic function panel WNL (AST 25, ALT 44) and patient made aware of results   Transient BP elevation  -- Will check vitals q 4 hours while awake and will order Clonidine 0.1mg  q8 hours PRN SBP>170 or DBP >100  -- Encouraged patient to keep BP log at home after discharge and see PCP for BP assessment after discharge   4. Discharge Planning:              -- Social work and case management to assist with discharge planning and identification of hospital follow-up needs prior to discharge; will attempt to provide psychoeducation for husband about depression prior to discharge             -- Estimated LOS: 2-3 days             -- Discharge Concerns: Need to establish  a safety plan; Medication compliance and effectiveness             -- Discharge Goals: Return home with outpatient referrals for mental health follow-up including medication management/psychotherapy  I certify that inpatient services furnished can reasonably be expected to improve the patient's condition.     Comer Locket, MD, FAPA 05/30/2020, 3:08 PM

## 2020-05-30 NOTE — Progress Notes (Signed)
D: Patient presents with anxious affect and reports mild anxiety at time of assessment. Patient denies SI/HI at this time. Patient also denies AH/VH at this time. Patient contracts for safety.  A: Provided positive reinforcement and encouragement.  R: Patient cooperative and receptive to efforts. Patient remains safe on the unit.   05/30/20 2123  Psych Admission Type (Psych Patients Only)  Admission Status Voluntary  Psychosocial Assessment  Patient Complaints Anxiety  Eye Contact Brief  Facial Expression Flat  Affect Appropriate to circumstance  Speech Soft  Interaction Forwards little  Motor Activity Other (Comment) (WDL)  Appearance/Hygiene Unremarkable  Behavior Characteristics Cooperative;Appropriate to situation  Mood Anxious  Thought Process  Coherency WDL  Content WDL  Delusions None reported or observed  Perception WDL  Hallucination None reported or observed  Judgment Poor  Confusion None  Danger to Self  Current suicidal ideation? Denies  Self-Injurious Behavior No self-injurious ideation or behavior indicators observed or expressed   Agreement Not to Harm Self Yes  Description of Agreement verbal contract  Danger to Others  Danger to Others None reported or observed

## 2020-05-30 NOTE — Progress Notes (Signed)
The patient rated her day as a 6 out of 10 but had nothing else to share about her day.

## 2020-05-30 NOTE — Progress Notes (Signed)
Recreation Therapy Notes  Date: 3.7.22 Time: 0930 Location: 300 Hall Dayroom  Group Topic: Stress Management  Goal Area(s) Addresses:  Patient will identify positive stress management techniques. Patient will identify benefits of using stress management post d/c.  Behavioral Response: Engaged  Intervention: Stress Management  Activity: Guided Imagery.  LRT read a script that took patients on a relaxing journey on the beach to listen to the peaceful waves and take in the all calming elements.  Patients were to listen as the script was read to engage in the activity.    Education:  Stress Management, Discharge Planning.   Education Outcome: Acknowledges Education  Clinical Observations/Feedback: Pt attended and participated in group session.    Caroll Rancher, LRT/CTRS         Lillia Abed, Marjette A 05/30/2020 11:10 AM

## 2020-05-30 NOTE — BHH Counselor (Signed)
CSW left a message at 1:45pm on 05/30/20 for [name] from [facility] CPS in order to complete a report at a later time.   [name], LCSWA Clinicial Social Worker [employer]

## 2020-05-30 NOTE — BHH Group Notes (Signed)
Occupational Therapy Group Note Date: 05/30/2020 Group Topic/Focus: Stress Management  Group Description: Group encouraged increased participation and engagement through discussion focused on topic of stress management. Patients engaged interactively to discuss components of stress including physical signs, emotional signs, negative management strategies, and positive management strategies. Each individual identified one new stress management strategy they would like to try moving forward.    Therapeutic Goals: Identify current stressors Identify healthy vs unhealthy stress management strategies/techniques Discuss and identify physical and emotional signs of stress Participation Level: Patient did not attend OT group session despite personal invitation.   Pt was present for less than 5 minutes and left without reason.   Plan: Continue to engage patient in OT groups 2 - 3x/week.  05/30/2020  Donne Hazel, MOT, OTR/L

## 2020-05-30 NOTE — Progress Notes (Addendum)
Chaplain responding to spiritual care consult.  Met with pt 1:1 with interpretation assistance.    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    She describes feeling distance from God and that she has disappointed God or faith is not strong enough for coming to [facility].   Is concerned for what her church community will think / say.  Feeling of closeness of God's spirit is often important for folks in charismatic traditions and her description of distance is consistent with general "numbness of feeling" described in H&P.  Chaplain normalized feelings of fear and uncertainty and explored areas of faith tradition that hold space for rest and help.  Lisa Herrera identified scripture as a Theatre manager and is reading her bible while at [facility].  Explored areas of scripture where God embraced all as well as stories where God revealed God self in places that were unexpected - such as Psalms where [place] was in exile.  Also explored scriptures that normalized feelings of fear and reaching out for help - such as Jesus' request for his friends to stay awake in the garden.  Lisa Herrera requested prayers. Chaplain shared prayers with Lisa Herrera.    Jerene Pitch, MDiv, [facility]

## 2020-05-30 NOTE — BHH Group Notes (Signed)
LCSW Group Therapy Note  Type of Therapy/Topic: Group Therapy: Six Dimensions of Wellness  Participation Level: Did Not Attend  Description of Group: This group will address the concept of wellness and the six concepts of wellness: occupational, physical, social, intellectual, spiritual, and emotional. Patients will be encouraged to process areas in their lives that are out of balance and identify reasons for remaining unbalanced. Patients will be encouraged to explore ways to practice healthy habits daily to attain better physical and mental health outcomes.  Therapeutic Goals: 1. Identify aspects of wellness that they are doing well. 2. Identify aspects of wellness that they would like to improve upon. 3. Identify one action they can take to improve an aspect of wellness in their lives.   Summary of Patient Progress: Patient did not attend.  Therapeutic Modalities: Cognitive Behavioral Therapy Solution-Focused Therapy Relapse Prevention   Ah Bott MSW, LCSW Clincal Social Worker  [facility]  

## 2020-05-30 NOTE — BHH Counselor (Signed)
Adult Comprehensive Assessment  Patient ID: Lisa Herrera, female   DOB: Dec 30, 1995, 25 y.o.   MRN: [id number]  Information Source: Information source: Patient,Interpreter Wynonia Musty [phone])  Current Stressors:  Patient states their primary concerns and needs for treatment are:: "Because I wasn't feeling like talking to anyone, watching my kids. I have had no emotions. I started thinking about wanting to die." Patient states their goals for this hospitilization and ongoing recovery are:: "Help with anxiety, help myself so I can take care of my kids, defeat my fears so I can take care of my kids and myself." Family Relationships: "My mom, sisters and brother. They always ask me for favors and ask for my advice and help." Physical health (include injuries & life threatening diseases): "I have a lot of stomach pain from the anxiety." Bereavement / Loss: "I lost a few family members a few years ago and my grandpa a few weeks ago."  Living/Environment/Situation:  Living Arrangements: Spouse/significant other,Children Living conditions (as described by patient or guardian): Pt reports that she has recently been living with her mother but had been previously living in an apartment with her spouse and children Who else lives in the home?: mother, sisters, neices, pt's children How long has patient lived in current situation?: 2 months What is atmosphere in current home: Chaotic,Comfortable,Other (Comment) (Stressful)  Family History:  Marital status: Married Number of Years Married: 2 What types of issues is patient dealing with in the relationship?: He is currently staying at his mother's Are you sexually active?: Yes What is your sexual orientation?: heterosexual Has your sexual activity been affected by drugs, alcohol, medication, or emotional stress?: None reported Does patient have children?: Yes How many children?: 2 How is patient's relationship with their children?: 70 year old girl; 53 month  old boy. reports they are also currently staying with her mother  Childhood History:  By whom was/is the patient raised?: Mother Additional childhood history information: Reports Mother and father were both always working and that she barely saw either of them Description of patient's relationship with caregiver when they were a child: "My parents seperated when I was 50 years old and I took care of my brother because they were always working." Patient's description of current relationship with people who raised him/her: "My dad lives in [place] and I miss him. We talk on the phone and I did get to visit him recently." "I have a good relationship with my mom she just gets stressed out." How were you disciplined when you got in trouble as a child/adolescent?: "My mom was strict. She checked my grades and kept me in extra classes." Does patient have siblings?: Yes Number of Siblings: 4 Description of patient's current relationship with siblings: 1 brother, 3 sisters "I get along with my sisters but they are immature. My brother is schizophrenic." Did patient suffer any verbal/emotional/physical/sexual abuse as a child?: No Did patient suffer from severe childhood neglect?: Yes Patient description of severe childhood neglect: had to take care of siblings Has patient ever been sexually abused/assaulted/raped as an adolescent or adult?: No Was the patient ever a victim of a crime or a disaster?: No Witnessed domestic violence?: No Has patient been affected by domestic violence as an adult?: No  Education:  Highest grade of school patient has completed: Producer, television/film/video Currently a student?: No Learning disability?: No  Employment/Work Situation:   Employment situation: Unemployed What is the longest time patient has a held a job?: 2 years Where was the patient  employed at that time?: "[employer]" Has patient ever been in the [employer]?: No  Financial Resources:   Surveyor, quantity resources: Income from  spouse Does patient have a representative payee or guardian?: No  Alcohol/Substance Abuse:   What has been your use of drugs/alcohol within the last 12 months?: None reported If attempted suicide, did drugs/alcohol play a role in this?: No Alcohol/Substance Abuse Treatment Hx: Denies past history Has alcohol/substance abuse ever caused legal problems?: No  Social Support System:   Patient's Community Support System: Passenger transport manager Support System: "my mama, mom-in-law, husbands, and pastors" Type of faith/religion: "Chrstian" How does patient's faith help to cope with current illness?: "I used to but I stopped."  Leisure/Recreation:   Do You Have Hobbies?: Yes Leisure and Hobbies: "play instruments, talk but I don't like to do these anymore."  Strengths/Needs:   What is the patient's perception of their strengths?: "music"  Discharge Plan:   Currently receiving community mental health services: No Patient states concerns and preferences for aftercare planning are: interested in therapist and medication management Patient states they will know when they are safe and ready for discharge when: "When I feel alive again and not fearful of leaving here." Does patient have access to transportation?: Yes (via family.) Does patient have financial barriers related to discharge medications?: No Will patient be returning to same living situation after discharge?: Yes (mother's home)  Summary/Recommendations:   Summary and Recommendations (to be completed by the evaluator): 25yo female presenting as a walk in to [facility] for evaluation of suicidal thoughts. [facility] interpreter services used throughout assessment (Spanish). Pt reports that she has had suicidal ideation with vague plans (pt has been thinking about different ways) with unclear intent on follow through. Pt reports that she has been feeling a lot of depression and anxiety after the birth of her baby 6 months ago. Pt reports that she  has a 27yo and did not have similar symptoms after the birth of her first child. Pt denies any HI, AVH, or substance use. Pt endorses paranoid ideation. Pt states that she was under the care of a psychiatrist (televisit) but did not continue to follow up with them. Pt reports that her PCP did give her pills for depression (post partum depression) but she felt that the medication was not helpful and discontinued. Pt presents with a very flat affect. Pt states that she feels that she is a danger to herself at time of assessment. While here, Itxel Wickard can benefit from crisis stabilization, medication management, therapeutic milieu, and referrals for services.  [name]. 05/30/2020

## 2020-05-31 DIAGNOSIS — F322 Major depressive disorder, single episode, severe without psychotic features: Principal | ICD-10-CM

## 2020-05-31 MED ORDER — SERTRALINE HCL 25 MG PO TABS
75.0000 mg | ORAL_TABLET | Freq: Every day | ORAL | Status: DC
  Administered 2020-06-01 – 2020-06-02 (×2): 75 mg via ORAL
  Filled 2020-05-31 (×4): qty 3

## 2020-05-31 NOTE — Progress Notes (Addendum)
   05/31/20 2045  Psych Admission Type (Psych Patients Only)  Admission Status Voluntary  Psychosocial Assessment  Patient Complaints Anxiety  Eye Contact Fair  Facial Expression Flat  Affect Appropriate to circumstance  Speech Soft  Interaction Forwards little  Motor Activity Other (Comment) (wnl)  Appearance/Hygiene Unremarkable  Behavior Characteristics Anxious  Mood Anxious;Preoccupied  Thought Process  Coherency WDL  Content WDL  Delusions None reported or observed  Perception WDL  Hallucination None reported or observed  Judgment Poor  Confusion None  Danger to Self  Current suicidal ideation? Denies  Danger to Others  Danger to Others None reported or observed   Pt denies SI, HI, AVH and pain. Pt rates anxiety 8/10 and denies depression. Pt refused Trazodone tonight but took Vistaril 25 mg. Pt states that her menstrual period is 2 days late and wants another pregnancy test to be sure she is not pregnant. Provider notified and order placed.
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
## 2020-05-31 NOTE — Progress Notes (Signed)
Psychoeducational Group Note  Date:  05/31/2020 Time:  2244  Group Topic/Focus:  Wrap-Up Group:   The focus of this group is to help patients review their daily goal of treatment and discuss progress on daily workbooks.  Participation Level: Did Not Attend  Participation Quality:  Not Applicable  Affect:  Not Applicable  Cognitive:  Not Applicable  Insight:  Not Applicable  Engagement in Group: Not Applicable  Additional Comments:  The patient did not attend group this evening.   Bryanna Yim S 05/31/2020, 10:44 PM 
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
## 2020-05-31 NOTE — Progress Notes (Signed)
Pt denied SI/HI/AVH.  Pt reported that she is reading her Bible today which is helping to lower her anxiety.  Pt took medications without incident and no adverse reactions were noted.  Pt's blood pressure has been taken several times today and all readings are within normal limits. Pt appears to be in no acute distress at this time.  Pt remains safe on the unit with q 15 min checks in place.
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
## 2020-06-01 NOTE — Progress Notes (Signed)
Adult Psychoeducational Group Note  Date:  06/01/2020 Time:  9:06 PM  Group Topic/Focus:  Wrap-Up Group:   The focus of this group is to help patients review their daily goal of treatment and discuss progress on daily workbooks.  Participation Level:  Active  Participation Quality:  Appropriate  Affect:  Appropriate  Cognitive:  Appropriate  Insight: Appropriate  Engagement in Group:  Engaged  Modes of Intervention:  Discussion  Additional Comments:  Patient said her day was 8. Her goal for today make me happy and be emotional again. She did not achieve her goal. Walking is the most helpful coping skills.  Charna Busman Long 06/01/2020, 9:06 PM

## 2020-06-01 NOTE — BHH Counselor (Signed)
CSW contacted [facility] CPS to inquire on the status on the CPS report that was made on 05/31/20. [name] informed CSW that pt's case has been screened out.   [name], LCSWA Clinicial Social Worker [employer]

## 2020-06-01 NOTE — Progress Notes (Signed)
Recreation Therapy Notes  Date: 3.9.22 Time: 0930 Location: [address] Dayroom  Group Topic: Stress Management  Goal Area(s) Addresses:  Patient will identify positive stress management techniques. Patient will identify benefits of using stress management post d/c.  Intervention: Stress Management  Activity:  Meditation.  LRT played a meditation that focused on taking on the characteristics of a mountain by standing strong in the face of adversity.    Education:  Stress Management, Discharge Planning.   Education Outcome: Acknowledges Education  Clinical Observations/Feedback: Pt did not attend group session.     Marjette Lindsay, LRT/CTRS         Lindsay, Marjette A 06/01/2020 11:20 AM 

## 2020-06-01 NOTE — [facility] Group Notes (Signed)
Type of Therapy and Topic: Group Therapy: Gratitude  Participation Level: Active   Description of Group: The purpose behind this group is to get people thinking about things for which  they can be grateful. If continued over time, they might begin to spontaneously look for things and  situations for which to be grateful. Gratitude is related to a "wide variety of forms of wellbeing , whereas "negative attributions" can adversely affect relationships.  Several studies have shown that interventions to  increase gratitude can impact areas such as overall life satisfaction, decreased negative affect, increased happiness, the ability to provide emotional support to others, and decreased worrying. Pts were provided a paper bag and prompted to decorate it with things they are grateful for or things that make them happy. Pt's were then asked to share what they drew on their bag and why. Pts were encouraged to write things they are grateful for and place them in their bag daily and reflect back on there bag when they are having a difficult day.   Summary of Progress/Problems: InterpreterOswaldo Done: [id number] Pt was attentive and decorated her bag when prompted. Pt shared that she decorated her bag with "I love God" because she is a [place]. Pt shared that she feels that sometimes we have to fall to understand why you are here.   Therapeutic Goals: 1. Patient will learn activities that focus on gratitude in their daily lives. 2. Patient will share gratitude in their daily lives. 3. Patient will learn to develop healthy habits and positive thinking techniques. 4. Patient will receive support and feedback from others  Therapeutic Modalities: Cognitive Behavioral Therapy Solution Focused Therapy Motivational Interviewing  [name], LCSWA Clinicial Social Worker [employer]

## 2020-06-01 NOTE — Plan of Care (Signed)
  Problem: Education: Goal: Emotional status will improve Outcome: Progressing Goal: Mental status will improve Outcome: Progressing   

## 2020-06-01 NOTE — Progress Notes (Signed)
Reminded pt this morning to provide a urine sample. Pt states that she thinks she is not pregnant now and does not want to do the test. Urine specimen was not collected.

## 2020-06-01 NOTE — Tx Team (Signed)
Interdisciplinary Treatment and Diagnostic Plan Update  06/01/2020 Time of Session: 9:20am  Lisa Herrera MRN: [id number]  Principal Diagnosis: MDD (major depressive episode), single episode, severe, no psychosis (HCC)  Secondary Diagnoses: Principal Problem:   MDD (major depressive episode), single episode, severe, no psychosis (HCC) Active Problems:   Gastroesophageal reflux disease with esophagitis without hemorrhage   Breathing-related sleep disorder   Panic disorder   Current Medications:  Current Facility-Administered Medications  Medication Dose Route Frequency Provider Last Rate Last Admin  . acetaminophen (TYLENOL) tablet 650 mg  650 mg Oral Q6H PRN Antonieta Pert, MD      . alum & mag hydroxide-simeth (MAALOX/MYLANTA) 200-200-20 MG/5ML suspension 30 mL  30 mL Oral Q4H PRN Antonieta Pert, MD      . cloNIDine (CATAPRES) tablet 0.1 mg  0.1 mg Oral Q8H PRN Mason Jim, Amy E, MD      . hydrOXYzine (ATARAX/VISTARIL) tablet 25 mg  25 mg Oral Q6H PRN Aldean Baker, NP   25 mg at 05/31/20 2115  . magnesium hydroxide (MILK OF MAGNESIA) suspension 30 mL  30 mL Oral Daily PRN Antonieta Pert, MD      . multivitamin with minerals tablet 1 tablet  1 tablet Oral Daily Antonieta Pert, MD   1 tablet at 06/01/20 [phone]  . pantoprazole (PROTONIX) EC tablet 80 mg  80 mg Oral Daily Aldean Baker, NP   80 mg at 06/01/20 2585  . sertraline (ZOLOFT) tablet 75 mg  75 mg Oral Daily Antonieta Pert, MD   75 mg at 06/01/20 0824  . traZODone (DESYREL) tablet 50 mg  50 mg Oral QHS Comer Locket, MD   50 mg at 05/29/20 2134   PTA Medications: Medications Prior to Admission  Medication Sig Dispense Refill Last Dose  . omeprazole (PRILOSEC) 40 MG capsule Take 1 capsule (40 mg total) by mouth daily before breakfast. 30 capsule 2   . Prenatal Vit-Fe Fumarate-FA (PRENATAL MULTIVITAMIN) TABS tablet Take 1 tablet by mouth daily.        Patient Stressors: Other: Birth of child 6 months  ago  Patient Strengths: Contractor Physical Health Supportive family/friends  Treatment Modalities: Medication Management, Group therapy, Case management,  1 to 1 session with clinician, Psychoeducation, Recreational therapy.   Physician Treatment Plan for Primary Diagnosis: MDD (major depressive episode), single episode, severe, no psychosis (HCC) Long Term Goal(s): Improvement in symptoms so as ready for discharge   Short Term Goals: Ability to identify changes in lifestyle to reduce recurrence of condition will improve Ability to identify and develop effective coping behaviors will improve  Medication Management: Evaluate patient's response, side effects, and tolerance of medication regimen.  Therapeutic Interventions: 1 to 1 sessions, Unit Group sessions and Medication administration.  Evaluation of Outcomes: Progressing  Physician Treatment Plan for Secondary Diagnosis: Principal Problem:   MDD (major depressive episode), single episode, severe, no psychosis (HCC) Active Problems:   Gastroesophageal reflux disease with esophagitis without hemorrhage   Breathing-related sleep disorder   Panic disorder  Long Term Goal(s): Improvement in symptoms so as ready for discharge   Short Term Goals: Ability to identify changes in lifestyle to reduce recurrence of condition will improve Ability to identify and develop effective coping behaviors will improve     Medication Management: Evaluate patient's response, side effects, and tolerance of medication regimen.  Therapeutic Interventions: 1 to 1 sessions, Unit Group sessions and Medication administration.  Evaluation of Outcomes: Progressing   RN Treatment Plan  for Primary Diagnosis: MDD (major depressive episode), single episode, severe, no psychosis (HCC) Long Term Goal(s): Knowledge of disease and therapeutic regimen to maintain health will improve  Short Term Goals: Ability to remain free from injury will improve,  Ability to participate in decision making will improve, Ability to verbalize feelings will improve, Ability to disclose and discuss suicidal ideas and Ability to identify and develop effective coping behaviors will improve  Medication Management: RN will administer medications as ordered by provider, will assess and evaluate patient's response and provide education to patient for prescribed medication. RN will report any adverse and/or side effects to prescribing provider.  Therapeutic Interventions: 1 on 1 counseling sessions, Psychoeducation, Medication administration, Evaluate responses to treatment, Monitor vital signs and CBGs as ordered, Perform/monitor CIWA, COWS, AIMS and Fall Risk screenings as ordered, Perform wound care treatments as ordered.  Evaluation of Outcomes: Progressing   LCSW Treatment Plan for Primary Diagnosis: MDD (major depressive episode), single episode, severe, no psychosis (HCC) Long Term Goal(s): Safe transition to appropriate next level of care at discharge, Engage patient in therapeutic group addressing interpersonal concerns.  Short Term Goals: Engage patient in aftercare planning with referrals and resources, Increase social support, Increase emotional regulation, Facilitate acceptance of mental health diagnosis and concerns, Identify triggers associated with mental health/substance abuse issues and Increase skills for wellness and recovery  Therapeutic Interventions: Assess for all discharge needs, 1 to 1 time with Social worker, Explore available resources and support systems, Assess for adequacy in community support network, Educate family and significant other(s) on suicide prevention, Complete Psychosocial Assessment, Interpersonal group therapy.  Evaluation of Outcomes: Progressing   Progress in Treatment: Attending groups: Yes. Participating in groups: Yes. Taking medication as prescribed: Yes. Toleration medication: Yes. Family/Significant other  contact made: No, will contact:  If consents are given  Patient understands diagnosis: Yes. Discussing patient identified problems/goals with staff: Yes. Medical problems stabilized or resolved: Yes. Denies suicidal/homicidal ideation: Yes. Issues/concerns per patient self-inventory: No.   New problem(s) identified: No, Describe:  None  New Short Term/Long Term Goal(s): medication stabilization, elimination of SI thoughts, development of comprehensive mental wellness plan.   Patient Goals:  "I want to feel emotions again and reduce anxiety"   Discharge Plan or Barriers: Patient recently admitted. CSW will continue to follow and assess for appropriate referrals and possible discharge planning.   Reason for Continuation of Hospitalization: Anxiety Depression Medication stabilization Suicidal ideation  Estimated Length of Stay: 3 to 5 days   Attendees: Patient: Lisa Herrera 06/01/2020  Physician: Landry Mellow, MD 06/01/2020   Nursing:  06/01/2020   RN Care Manager: 06/01/2020   Social Worker: [name], LCSWA 06/01/2020   Recreational Therapist:  06/01/2020   Other:  06/01/2020   Other:  06/01/2020   Other: 06/01/2020      Scribe for Treatment Team: [name], LCSWA 06/01/2020 11:44 AM

## 2020-06-01 NOTE — Progress Notes (Addendum)
   06/01/20 1300  Psych Admission Type (Psych Patients Only)  Admission Status Voluntary  Psychosocial Assessment  Patient Complaints Anxiety;Depression  Eye Contact Fair  Facial Expression Flat  Affect Appropriate to circumstance  Speech Soft  Interaction Forwards little  Motor Activity Other (Comment) (wdl)  Appearance/Hygiene Unremarkable  Behavior Characteristics Cooperative;Appropriate to situation  Mood Anxious  Thought Process  Coherency WDL  Content WDL  Delusions None reported or observed  Perception WDL  Hallucination None reported or observed  Judgment Impaired  Confusion None  Danger to Self  Current suicidal ideation? Denies  Danger to Others  Danger to Others None reported or observed  She has been up and visible on the unit.  She has been attending group and interacting well with her roommate.  She reported looking forward to being discharged tomorrow.  She completed her self inventory and reported depression, hopelessness and anxiety are 0/10 (10 the worst).  She did request to be linked to a [facility] support group when she is discharged.  Informed SW of the above.

## 2020-06-02 MED ORDER — TRAZODONE HCL 50 MG PO TABS
50.0000 mg | ORAL_TABLET | Freq: Every day | ORAL | 0 refills | Status: AC
Start: 2020-06-02 — End: ?

## 2020-06-02 MED ORDER — HYDROXYZINE HCL 25 MG PO TABS: 25.0000 mg | ORAL_TABLET | Freq: Four times a day (QID) | ORAL | 0 refills | Status: AC | PRN

## 2020-06-02 MED ORDER — SERTRALINE HCL 25 MG PO TABS: 75.0000 mg | ORAL_TABLET | Freq: Every day | ORAL | 0 refills | Status: DC

## 2020-06-02 NOTE — Progress Notes (Signed)
Discharge Note:  Patient's husband picked her up and is taking her home.  Suicide prevention information given and discussed with patient who stated she understood.  Patient stated she received all her belongings, clothing, toiletries, misc items, etc.  Patient stated she appreciated all assistance received from [facility].  All required discharge information given to patient.

## 2020-06-02 NOTE — Progress Notes (Signed)
D:  Patient denied SI and HI, contracts for safety.  Denied A/V hallucinations.  Denied pain. A:  Medications administered per MD orders.  Emotional support and encouragement given patient. R:  Safety maintained with 15 minute checks.  

## 2020-06-02 NOTE — Discharge Summary (Signed)
Physician Discharge Summary Note  Patient:  Lisa Herrera is an 25 y.o., female  MRN:  [id number]  DOB:  Sep 16, 1995  Patient phone:  [phone] (home)   Patient address:   [place] [address],   Total Time spent with patient: Greater than 30 minutes  Date of Admission:  05/26/2020  Date of Discharge: 06-02-20  Reason for Admission: Worsening depression triggering suicidal ideations.  Principal Problem: MDD (major depressive episode), single episode, severe, no psychosis ([place])  Discharge Diagnoses: Principal Problem:   MDD (major depressive episode), single episode, severe, no psychosis ([place]) Active Problems:   Gastroesophageal reflux disease with esophagitis without hemorrhage   Breathing-related sleep disorder   Panic disorder  Past Psychiatric History: Major depressive disorder, recurrent.  Past Medical History:  Past Medical History:  Diagnosis Date  . Alpha thalassemia silent carrier 05/29/2019  . Gallstones   . Hepatic steatosis 02/04/2020  . Migraine   . Umbilical hernia without obstruction and without gangrene 02/04/2020    Past Surgical History:  Procedure Laterality Date  . CESAREAN SECTION N/A 01/14/2016   Procedure: CESAREAN SECTION;  Surgeon: Osborne Oman, MD;  Location: [facility];  Service: Obstetrics;  Laterality: N/A;  . CESAREAN SECTION N/A 11/17/2019   Procedure: CESAREAN SECTION;  Surgeon: Truett Mainland, DO;  Location: [facility];  Service: Obstetrics;  Laterality: N/A;  . CHOLECYSTECTOMY    . TUBAL LIGATION Bilateral 11/17/2019   Procedure: BILATERAL TUBAL LIGATION;  Surgeon: Truett Mainland, DO;  Location: [facility];  Service: Obstetrics;  Laterality: Bilateral;   Family History:  Family History  Problem Relation Age of Onset  . Breast cancer Maternal Aunt        50's  . Colon cancer Neg Hx   . Stomach cancer Neg Hx   . Esophageal cancer Neg Hx    Family Psychiatric  History: See H&P  Social History:   Social History   Substance and Sexual Activity  Alcohol Use No  . Alcohol/week: 0.0 standard drinks     Social History   Substance and Sexual Activity  Drug Use No    Social History   Socioeconomic History  . Marital status: Married    Spouse name: Not on file  . Number of children: 1  . Years of education: Not on file  . Highest education level: Not on file  Occupational History  . Not on file  Tobacco Use  . Smoking status: Never Smoker  . Smokeless tobacco: Never Used  Vaping Use  . Vaping Use: Never used  Substance and Sexual Activity  . Alcohol use: No    Alcohol/week: 0.0 standard drinks  . Drug use: No  . Sexual activity: Yes    Birth control/protection: None  Other Topics Concern  . Not on file  Social History Narrative   Married, originally from [place], [place].   Son born October 2021 and a daughter born in 2017   No alcohol no tobacco or drug use   Social Determinants of Radio broadcast assistant Strain: Not on file  Food Insecurity: Not on file  Transportation Needs: Not on file  Physical Activity: Not on file  Stress: Not on file  Social Connections: Not on file   Hospital Course: (Per Md's admission evaluation notes): (The patient is interviewed using an in person Spanish interpreter.)The patient is a 25y/o Spanish speaking female with no known past psychiatric history who was seen as a walk-in to the [facility] on  05/25/2020 for evaluation of suicidal thoughts and worsening depression. She states that she sought treatment because she "felt very conscious that something was wrong with me because I felt no emotions."  She gives collateral history that between January and August 2021 she had onset of panic attacks with sensation of heart racing, panic feeling, shortness of breath, and feeling faint.She states that during this same time period she had multiple psychosocial stressors including being pregnant and being told her son may be born with spina  bifida, having a critically ill niece, and losing her uncle to [place]. She estimates she has had at least 4 severe panic attacks to date and is now worried she will have ongoing panic symptoms. She delivered her son in August without any spina bifida or birth complications and she feels she bonded well with him. She does not recall having onset of post-partum mood issues in the weeks following his birth and denies previous post-partum mood issues with the birth of her older child who is age 72. She denies being on psychotropic medications during her pregnancy. She states within 3 months of delivering her son, she had more psychosocial stress when her brother told the family he is gay which has caused extreme conflict in the family.    After evaluation of her presenting symptoms as noted above, Lisa Herrera was recommended for mood stabilization treatments. The medication regimen for her presenting symptoms were discussed & with her consent initiated. She received, stabilized & was discharged on the medications as listed below on her discharge medication lists. She was also enrolled & participated in the group counseling sessions being offered & held on this unit. She learned coping skills. She presented on this admission, other pre-existing mild medical conditions that required treatment & monitoring. She was resumed & discharged on all pertinent home medication for the medical issue. She tolerated her treatment regimen without any adverse effects or reactions reported. Lisa Herrera's symptoms responded well to her treatment regimen warranting this discharge. This is evidenced by her reports of improved mood, absence of suicidal ideations & presentation of good affect.   As stated above, Lisa Herrera's symptoms has subsided & mood stable. Patient has met the maximum benefit of her hospitalization. She is currently mentally & medically stable to continue mental health care & medication management on an outpatient basis as noted  below. She is provided with all the necessary information needed to make this appointment without problems. As of notes, all Lisa Herrera assessments, care & interaction with staff are conducted using the Hispanic language interpreter as she is not fluent in the [place] language.  During the course of her hospitalization, the 15-minute checks were adequate to ensure Lisa Herrera's safety.  Patient did not display any dangerous, violent or suicidal behavior on the unit.  She interacted with staff appropriately using the language interpreter. She participated appropriately in the group sessions/therapies. Her medications were addressed & adjusted to meet her needs. She was recommended for outpatient follow-up care & medication management upon discharge to assure continuity of care.  At the time of discharge patient is not reporting any acute suicidal/homicidal ideations. She feels more confident about her self-care & in managing her emotions moving forward. She currently denies any new issues or concerns. Education and supportive counseling provided throughout her hospital stay & upon discharge.  Today upon her discharge evaluation with the attending psychiatrist, Lisa Herrera shares she is doing well. She denies any other specific concerns. She is sleeping well. Her appetite is good. She denies other physical  complaints. She denies AH/VH, delusional thoughts or paranoia. She feels that her medications have been helpful & is in agreement to continue her current treatment regimen as recommended. She was able to engage in safety planning including plan to return to [facility] or contact emergency services if she feels unable to maintain her own safety or the safety of others. Pt had no further questions, comments, or concerns. She left [facility] with all personal belongings in no apparent distress. Transportation per family (Husband).    Physical Findings: AIMS: Facial and Oral Movements Muscles of Facial Expression: None, normal Lips and  Perioral Area: None, normal Jaw: None, normal Tongue: None, normal,Extremity Movements Upper (arms, wrists, hands, fingers): None, normal Lower (legs, knees, ankles, toes): None, normal, Trunk Movements Neck, shoulders, hips: None, normal, Overall Severity Severity of abnormal movements (highest score from questions above): None, normal Incapacitation due to abnormal movements: None, normal Patient's awareness of abnormal movements (rate only patient's report): No Awareness, Dental Status Current problems with teeth and/or dentures?: No Does patient usually wear dentures?: No  CIWA:    COWS:     Musculoskeletal: Strength & Muscle Tone: within normal limits Gait & Station: normal Patient leans: N/A  Psychiatric Specialty Exam:  Presentation  General Appearance: Appropriate for Environment  Eye Contact:Good  Speech:Normal Rate  Speech Volume:Normal  Handedness:Right  Mood and Affect  Mood:Euthymic  Affect:Appropriate   Thought Process  Thought Processes:Coherent  Descriptions of Associations:Intact  Orientation:Full (Time, Place and Person)  Thought Content:Abstract Reasoning  History of Schizophrenia/Schizoaffective disorder:No  Duration of Psychotic Symptoms:No data recorded Hallucinations:Hallucinations: Other (comment)  Ideas of Reference:None  Suicidal Thoughts:Suicidal Thoughts: No SI Active Intent and/or Plan: Without Plan; Without Intent  Homicidal Thoughts:Homicidal Thoughts: No  Sensorium  Memory:Immediate Good; Recent Good  Judgment:Intact  Insight:Present   Executive Functions  Concentration:Good  Attention Span:Good  [place] of Knowledge:Good  Language:Good  Psychomotor Activity  Psychomotor Activity:Psychomotor Activity: Normal  Assets  Assets:Housing; Desire for Improvement; Social Support  Sleep  Sleep:Sleep: Good Number of Hours of Sleep: 6  Physical Exam: Physical Exam Vitals and nursing note reviewed.   HENT:     Head: Normocephalic.     Nose: Nose normal.     Mouth/Throat:     Pharynx: Oropharynx is clear.  Eyes:     Pupils: Pupils are equal, round, and reactive to light.  Cardiovascular:     Rate and Rhythm: Normal rate.  Pulmonary:     Effort: Pulmonary effort is normal.  Abdominal:     Palpations: Abdomen is soft.  Genitourinary:    Comments: Deferred Musculoskeletal:        General: Normal range of motion.     Cervical back: Normal range of motion.  Skin:    General: Skin is warm and dry.  Neurological:     General: No focal deficit present.     Mental Status: She is alert and oriented to person, place, and time. Mental status is at baseline.    Review of Systems  Constitutional: Negative for chills and fever.  HENT: Negative for congestion and sore throat.   Eyes: Negative for blurred vision.  Respiratory: Negative for cough, shortness of breath and wheezing.   Cardiovascular: Negative for chest pain and palpitations.  Gastrointestinal: Negative for constipation, diarrhea, heartburn, nausea and vomiting.  Genitourinary: Negative for dysuria.  Musculoskeletal: Negative for joint pain and myalgias.  Neurological: Negative for dizziness, tingling, tremors, sensory change, speech change, focal weakness, seizures, loss of consciousness, weakness and headaches.  Endo/Heme/Allergies: Negative for environmental allergies. Does not bruise/bleed easily.       Allergies: Bupropion  Psychiatric/Behavioral: Positive for depression (Hx of: Stabilized with medication prior to discharge). Negative for hallucinations, memory loss, substance abuse and suicidal ideas. The patient has insomnia (Hx of (Stabilized with medication prior to discharge). The patient is not nervous/anxious (Stable upon discharge).    Blood pressure 111/72, pulse (!) 112, temperature 99.2 F (37.3 C), temperature source Oral, resp. rate 16, height 5' 1.5" (1.562 m), weight 107.7 kg, SpO2 100 %, not currently  breastfeeding. Body mass index is 44.15 kg/m.  Have you used any form of tobacco in the last 30 days? (Cigarettes, Smokeless Tobacco, Cigars, and/or Pipes): No  Has this patient used any form of tobacco in the last 30 days? (Cigarettes, Smokeless Tobacco, Cigars, and/or Pipes): N/A  Blood Alcohol level:  No results found for: [facility]  Metabolic Disorder Labs:  Lab Results  Component Value Date   HGBA1C 5.3 05/25/2020   MPG 105.41 05/25/2020   No results found for: PROLACTIN Lab Results  Component Value Date   CHOL 187 05/25/2020   TRIG 111 05/25/2020   HDL 55 05/25/2020   CHOLHDL 3.4 05/25/2020   VLDL 22 05/25/2020   LDLCALC 110 (H) 05/25/2020   See Psychiatric Specialty Exam and Suicide Risk Assessment completed by Attending Physician prior to discharge.  Discharge destination:  Home  Is patient on multiple antipsychotic therapies at discharge:  No   Has Patient had three or more failed trials of antipsychotic monotherapy by history:  No  Recommended Plan for Multiple Antipsychotic Therapies: NA  Allergies as of 06/02/2020      Reactions   Bupropion    Anxiousness Tachycardia      Medication List    TAKE these medications     Indication  hydrOXYzine 25 MG tablet Commonly known as: ATARAX/VISTARIL Take 1 tablet (25 mg total) by mouth every 6 (six) hours as needed for anxiety.  Indication: Feeling Anxious   omeprazole 40 MG capsule Commonly known as: PRILOSEC Take 1 capsule (40 mg total) by mouth daily before breakfast.  Indication: Gastroesophageal Reflux Disease, Gastroesophageal Reflux Disease with Current Symptoms   prenatal multivitamin Tabs tablet Take 1 tablet by mouth daily.  Indication: Vitamin Deficiency   sertraline 25 MG tablet Commonly known as: ZOLOFT Take 3 tablets (75 mg total) by mouth daily. For depression Start taking on: June 03, 2020  Indication: Major Depressive Disorder   traZODone 50 MG tablet Commonly known as: DESYREL Take 1  tablet (50 mg total) by mouth at bedtime. For sleep  Indication: [facility]. Go on 06/07/2020.   Specialty: Behavioral Health Why: You have a walk in appointment on 06/07/20 at 7:45 for therapy services  You also have a walk in appointment for medication management on 06/20/20 at 7:45, with Spanish interpreter. Walk in appointments are first come, first served and are held in person. Contact information: [place] [phone]             Follow-up recommendations:  Activity:  As tolerated Diet: As recommended by your primary care doctor. Keep all scheduled follow-up appointments as recommended.  Comments: Prescriptions given at discharge.  Patient agreeable to plan.  Given opportunity to ask questions.  Appears to feel comfortable with discharge denies any current suicidal or homicidal thought. Patient is also instructed prior to discharge to:  Take all medications as prescribed by his/her mental healthcare provider. Report any adverse effects and or reactions from the medicines to his/her outpatient provider promptly. Patient has been instructed & cautioned: To not engage in alcohol and or illegal drug use while on prescription medicines. In the event of worsening symptoms, patient is instructed to call the crisis hotline, 911 and or go to the nearest ED for appropriate evaluation and treatment of symptoms. To follow-up with his/her primary care provider for your other medical issues, concerns and or health care needs.  Signed: Lindell Spar, NP, PMHNP, FNO-BC 06/02/2020, 9:08 AM

## 2020-06-02 NOTE — BHH Suicide Risk Assessment (Signed)
[facility] Discharge Suicide Risk Assessment   Principal Problem: MDD (major depressive episode), single episode, severe, no psychosis (H[facility]) Discharge Diagnoses: Principal Problem:   MDD (major depressive episode), single episode, severe, no psychosis ([facility]) Active Problems:   Gastroesophageal reflux disease with esophagitis without hemorrhage   Breathing-related sleep disorder   Panic disorder   Total Time spent with patient: 20 minutes  Musculoskeletal: Strength & Muscle Tone: within normal limits Gait & Station: normal Patient leans: N/A  Psychiatric Specialty Exam: Review of Systems  All other systems reviewed and are negative.   Blood pressure 111/72, pulse (!) 112, temperature 99.2 F (37.3 C), temperature source Oral, resp. rate 16, height 5' 1.5" (1.562 m), weight 107.7 kg, SpO2 100 %, not currently breastfeeding.Body mass index is 44.15 kg/m.  General Appearance: Casual  Eye Contact::  Good  Speech:  Normal Rate409  Volume:  Normal  Mood:  Euthymic  Affect:  Congruent  Thought Process:  Coherent and Descriptions of Associations: Intact  Orientation:  Full (Time, Place, and Person)  Thought Content:  Logical  Suicidal Thoughts:  No  Homicidal Thoughts:  No  Memory:  Immediate;   Fair Recent;   Fair Remote;   Fair  Judgement:  Intact  Insight:  Fair  Psychomotor Activity:  Normal  Concentration:  Fair  Recall:  [employer] of Knowledge:Fair  Language: Good  Akathisia:  Negative  Handed:  Right  AIMS (if indicated):     Assets:  Desire for Improvement Housing Resilience Social Support  Sleep:  Number of Hours: 6.75  Cognition: WNL  ADL's:  Intact   Mental Status Per Nursing Assessment::   On Admission:  Suicidal ideation indicated by patient  Demographic Factors:  Low socioeconomic status and Unemployed  Loss Factors: Financial problems/change in socioeconomic status  Historical Factors: Impulsivity  Risk Reduction Factors:   Responsible for  children under 57 years of age, Sense of responsibility to family, Living with another person, especially a relative and Positive social support  Continued Clinical Symptoms:  Severe Anxiety and/or Agitation Depression:   Impulsivity  Cognitive Features That Contribute To Risk:  None    Suicide Risk:  Minimal: No identifiable suicidal ideation.  Patients presenting with no risk factors but with morbid ruminations; may be classified as minimal risk based on the severity of the depressive symptoms   Follow-up Information    [facility] Go on 06/07/2020.   Specialty: Behavioral Health Why: You have a walk in appointment on 06/07/20 at 7:45 for therapy services  You also have a walk in appointment for medication management on 06/20/20 at 7:45, with Spanish interpreter. Walk in appointments are first come, first served and are held in person. Contact information: [address] [place] [address] [phone]              Plan Of Care/Follow-up recommendations:  Activity:  ad lib  Antonieta Pert, MD 06/02/2020, 9:02 AM

## 2020-06-02 NOTE — Progress Notes (Signed)
   06/02/20 0500  Psych Admission Type (Psych Patients Only)  Admission Status Voluntary  Psychosocial Assessment  Patient Complaints Anxiety;Depression;Suspiciousness  Eye Contact Fair  Facial Expression Flat  Affect Flat  Speech Soft  Interaction Forwards little  Motor Activity Other (Comment) (wdl)  Appearance/Hygiene Unremarkable  Behavior Characteristics Appropriate to situation;Cooperative  Mood Anxious  Thought Process  Coherency WDL  Content WDL  Delusions None reported or observed  Perception WDL  Hallucination None reported or observed  Judgment Impaired  Confusion None  Danger to Self  Current suicidal ideation? Denies  Danger to Others  Danger to Others None reported or observed  D Alert and Oriented Presents with preoccupied content.  A Scheduled medications administered per Provider order. Support and encouragement provided. Routine safety checks conducted every 15 minutes. Patient notified to inform staff with problems or concerns.  R. No adverse drug reactions noted. Patient contracts for safety at this time. Will continue to monitor Patient.

## 2020-06-02 NOTE — Progress Notes (Signed)
  [facility] Adult Case Management Discharge Plan :  Will you be returning to the same living situation after discharge:  Yes,  home with husband At discharge, do you have transportation home?: Yes,  via husband Do you have the ability to pay for your medications: Yes,  has medicaid  Release of information consent forms completed and in the chart;  Patient's signature needed at discharge.  Patient to Follow up at:  Follow-up Information    [facility]. Go on 06/07/2020.   Specialty: Behavioral Health Why: You have a walk in appointment on 06/07/20 at 7:45 for therapy services  You also have a walk in appointment for medication management on 06/20/20 at 7:45, with Spanish interpreter. Walk in appointments are first come, first served and are held in person. Contact information: [address] [place] [address] [phone]              Next level of care provider has access to [facility] Link:yes  Safety Planning and Suicide Prevention discussed: Yes,  w/ husband  Have you used any form of tobacco in the last 30 days? (Cigarettes, Smokeless Tobacco, Cigars, and/or Pipes): No  Has patient been referred to the Quitline?: N/A patient is not a smoker  Patient has been referred for addiction treatment: N/A  [name], Theresia Majors 06/02/2020, 9:18 AM

## 2020-06-02 NOTE — BHH Group Notes (Signed)
The focus of this group is to help patients establish daily goals to achieve during treatment and discuss how the patient can incorporate goal setting into their daily lives to aide in recovery.   Patient did not attend group. 

## 2020-06-07 ENCOUNTER — Ambulatory Visit: Payer: Medicaid Other | Admitting: Registered"

## 2020-07-07 ENCOUNTER — Other Ambulatory Visit: Payer: Self-pay | Admitting: Nurse Practitioner

## 2020-07-07 ENCOUNTER — Encounter: Payer: Medicaid Other | Admitting: Registered"

## 2020-07-07 DIAGNOSIS — N644 Mastodynia: Secondary | ICD-10-CM

## 2020-07-14 ENCOUNTER — Other Ambulatory Visit: Payer: Self-pay

## 2020-07-14 ENCOUNTER — Ambulatory Visit (INDEPENDENT_AMBULATORY_CARE_PROVIDER_SITE_OTHER): Payer: Medicaid Other | Admitting: Clinical

## 2020-07-14 DIAGNOSIS — F330 Major depressive disorder, recurrent, mild: Secondary | ICD-10-CM | POA: Diagnosis not present

## 2020-07-15 ENCOUNTER — Encounter (HOSPITAL_COMMUNITY): Payer: Self-pay | Admitting: Physician Assistant

## 2020-07-15 ENCOUNTER — Ambulatory Visit (INDEPENDENT_AMBULATORY_CARE_PROVIDER_SITE_OTHER): Payer: Medicaid Other | Admitting: Physician Assistant

## 2020-07-15 VITALS — BP 120/74 | HR 93 | Ht 60.0 in | Wt 242.0 lb

## 2020-07-15 DIAGNOSIS — F330 Major depressive disorder, recurrent, mild: Secondary | ICD-10-CM

## 2020-07-15 DIAGNOSIS — F4310 Post-traumatic stress disorder, unspecified: Secondary | ICD-10-CM | POA: Diagnosis not present

## 2020-07-15 DIAGNOSIS — F411 Generalized anxiety disorder: Secondary | ICD-10-CM

## 2020-07-15 MED ORDER — SERTRALINE HCL 25 MG PO TABS: 75.0000 mg | ORAL_TABLET | Freq: Every day | ORAL | 1 refills | Status: DC

## 2020-07-15 NOTE — Progress Notes (Signed)
Psychiatric Initial Adult Assessment   Patient Identification: Lisa Herrera MRN:  [id number] Date of Evaluation:  07/15/2020 Referral Source: [facility] Chief Complaint:   Chief Complaint    Medication Management     Visit Diagnosis: No diagnosis found.  History of Present Illness:    Lisa Herrera is a 25 year old female with a past psychiatric history significant for depression and anxiety who presents to [facility] for medication management.  Spanish interpretive services were utilized during the duration of the encounter.  Patient presents today for medication refill because she states that she will be out of her medication by tomorrow.  Patient is currently taking sertraline 75 mg daily and has been taking her medication for roughly a month after being discharged from [facility].  Patient states that she was admitted to [facility] due to worsening anxiety, depression, lack of appetite, and suicidal thoughts.  During that time, patient states that she did not try to make an attempt on her life but she did feel that if she was left alone that day, she may have tried to harm herself.  Patient states that her depression started last year but did not notice her symptoms until after having her child.  Patient reports that she started noticing depressive symptoms 3 months after her child was born.  Per chart review, patient was admitted to [facility] on 05/26/2020 for worsening depression triggering suicidal ideations.  Patient was discharged on 06/02/2020 on the following medications: Hydroxyzine 25 mg as needed, sertraline 75 mg daily, and trazodone 50 mg at bedtime.  Patient reports that she is not taking hydroxyzine or trazodone and is comfortable with not taking those medications upon conclusion of the encounter.  Patient reports the following symptoms: Fatigue and anxiety.  She reports that this past week was a difficult one due to the  recent passing of her grandmother this past Monday.  Patient rates her anxiety an 8 out of 10.  Alleviating factors to her anxiety include going to the park, reading, and going to church.  Patient stressors include family conflict related to the spread of gossip, rumors, and hate.  Patient believes that her medication is managing her depression well and that she is able to be mostly active during the day.  She does express some concerns that she is forgetful while on her sertraline, however, she states that she often tends to forget things when she is overly worried.  Patient's main concerns today are the current state of her family as well as the recent passing of her grandmother.  A GAD-7 screen was performed with the patient scoring a 4.  Patient is pleasant, calm, cooperative, and fully engaged in conversation during the encounter.  Patient describes her current mood as good and believes that the medication has been effective in the management of her depression and anxiety.  Patient denies suicidal or homicidal ideations.  She further denies auditory or visual hallucinations and does not appear to be responding to internal/external stimuli.  Patient endorses fair sleep and receives on average 6 hours of intermittent sleep.  Patient attributes her disturbed sleep to having to get up in the middle of the night to take care of her newborn.  Patient reports that she is also sleeping with a CPAP machine.  Patient endorses good appetite and eats on average 3 meals per day.  Patient denies alcohol consumption, tobacco use, and illicit drug use.  Associated Signs/Symptoms: Depression Symptoms:  insomnia, hypersomnia, psychomotor retardation,  fatigue, feelings of worthlessness/guilt, difficulty concentrating, impaired memory, recurrent thoughts of death, anxiety, loss of energy/fatigue, weight gain, (Hypo) Manic Symptoms:  Flight of Ideas, Anxiety Symptoms:  Agoraphobia, Excessive Worry, Obsessive  Compulsive Symptoms:   Patient reports cleaning house excessively, Social Anxiety, Psychotic Symptoms:  None PTSD Symptoms: Had a traumatic exposure:  No traumatic experiences reported Had a traumatic exposure in the last month:  N/A Re-experiencing:  Intrusive Thoughts Nightmares Hypervigilance:  No Hyperarousal:  Difficulty Concentrating Emotional Numbness/Detachment Sleep Avoidance:  None  Past Psychiatric History:  Depression Anxiety  Previous Psychotropic Medications: Yes   Substance Abuse History in the last 12 months:  No.  Consequences of Substance Abuse: NA  Past Medical History:  Past Medical History:  Diagnosis Date  . Alpha thalassemia silent carrier 05/29/2019  . Gallstones   . Hepatic steatosis 02/04/2020  . Migraine   . Umbilical hernia without obstruction and without gangrene 02/04/2020    Past Surgical History:  Procedure Laterality Date  . CESAREAN SECTION N/A 01/14/2016   Procedure: CESAREAN SECTION;  Surgeon: Tereso Newcomer, MD;  Location: [facility];  Service: Obstetrics;  Laterality: N/A;  . CESAREAN SECTION N/A 11/17/2019   Procedure: CESAREAN SECTION;  Surgeon: Levie Heritage, DO;  Location: [facility] LD ORS;  Service: Obstetrics;  Laterality: N/A;  . CHOLECYSTECTOMY    . TUBAL LIGATION Bilateral 11/17/2019   Procedure: BILATERAL TUBAL LIGATION;  Surgeon: Levie Heritage, DO;  Location: [facility] LD ORS;  Service: Obstetrics;  Laterality: Bilateral;    Family Psychiatric History:  Grandfather - patient states that her grandfather may have been schizophrenic but he was never officially diagnosed  Brother - patient states that her brother has been assessed for schizophrenia but has never been diagnosed  Family History:  Family History  Problem Relation Age of Onset  . Breast cancer Maternal Aunt        50's  . Colon cancer Neg Hx   . Stomach cancer Neg Hx   . Esophageal cancer Neg Hx     Social History:   Social History   Socioeconomic  History  . Marital status: Married    Spouse name: Not on file  . Number of children: 1  . Years of education: Not on file  . Highest education level: Not on file  Occupational History  . Not on file  Tobacco Use  . Smoking status: Never Smoker  . Smokeless tobacco: Never Used  Vaping Use  . Vaping Use: Never used  Substance and Sexual Activity  . Alcohol use: No    Alcohol/week: 0.0 standard drinks  . Drug use: No  . Sexual activity: Yes    Birth control/protection: None  Other Topics Concern  . Not on file  Social History Narrative   Married, originally from [place], Spanish-speaking.   Son born October 2021 and a daughter born in 2017   No alcohol no tobacco or drug use   Social Determinants of Corporate investment banker Strain: Not on file  Food Insecurity: Not on file  Transportation Needs: Not on file  Physical Activity: Not on file  Stress: Not on file  Social Connections: Not on file    Additional Social History:  Patient currently does not work  Allergies:   Allergies  Allergen Reactions  . Bupropion     Anxiousness Tachycardia    Metabolic Disorder Labs: Lab Results  Component Value Date   HGBA1C 5.3 05/25/2020   MPG 105.41 05/25/2020   No results  found for: PROLACTIN Lab Results  Component Value Date   CHOL 187 05/25/2020   TRIG 111 05/25/2020   HDL 55 05/25/2020   CHOLHDL 3.4 05/25/2020   VLDL 22 05/25/2020   LDLCALC 110 (H) 05/25/2020   Lab Results  Component Value Date   TSH 1.836 05/25/2020    Therapeutic Level Labs: No results found for: LITHIUM No results found for: CBMZ No results found for: VALPROATE  Current Medications: Current Outpatient Medications  Medication Sig Dispense Refill  . hydrOXYzine (ATARAX/VISTARIL) 25 MG tablet Take 1 tablet (25 mg total) by mouth every 6 (six) hours as needed for anxiety. 75 tablet 0  . omeprazole (PRILOSEC) 40 MG capsule Take 1 capsule (40 mg total) by mouth daily before breakfast. 30  capsule 2  . Prenatal Vit-Fe Fumarate-FA (PRENATAL MULTIVITAMIN) TABS tablet Take 1 tablet by mouth daily.     . sertraline (ZOLOFT) 25 MG tablet Take 3 tablets (75 mg total) by mouth daily. For depression 90 tablet 0  . traZODone (DESYREL) 50 MG tablet Take 1 tablet (50 mg total) by mouth at bedtime. For sleep 30 tablet 0   No current facility-administered medications for this visit.    Musculoskeletal: Strength & Muscle Tone: within normal limits Gait & Station: normal Patient leans: N/A  Psychiatric Specialty Exam: Review of Systems  Psychiatric/Behavioral: Positive for decreased concentration and sleep disturbance. Negative for agitation, dysphoric mood, hallucinations, self-injury and suicidal ideas. The patient is nervous/anxious. The patient is not hyperactive.     Blood pressure 120/74, pulse 93, height 5' (1.524 m), weight 242 lb (109.8 kg), SpO2 100 %, not currently breastfeeding.Body mass index is 47.26 kg/m.  General Appearance: Well Groomed  Eye Contact:  Good  Speech:  Clear and Coherent and Normal Rate  Volume:  Normal  Mood:  Anxious and Euthymic  Affect:  Appropriate and Congruent  Thought Process:  Coherent, Goal Directed and Descriptions of Associations: Intact  Orientation:  Full (Time, Place, and Person)  Thought Content:  WDL  Suicidal Thoughts:  No  Homicidal Thoughts:  No  Memory:  Immediate;   Good Recent;   Good Remote;   Good  Judgement:  Good  Insight:  Good  Psychomotor Activity:  Normal  Concentration:  Concentration: Good and Attention Span: Good  Recall:  Good  Fund of Knowledge:Good  Language: Good  Akathisia:  NA  Handed:  Right  AIMS (if indicated):  not done  Assets:  Communication Skills Desire for Improvement Housing Intimacy Social Support Vocational/Educational  ADL's:  Intact  Cognition: WNL  Sleep:  Fair   Screenings: AIMS   Flowsheet Row [facility]  AIMS Total Score 0    AUDIT   Flowsheet Row [facility]  Alcohol Use Disorder Identification Test Final Score (AUDIT) 0    GAD-7   Flow[facility]  Total GAD-7 Score 4 1    PHQ2-9   Flowsheet Row [facility]  PHQ-2 Total Score 0 0  PHQ-9 Total Score -- 0    Flowsheet Row [facility]  C-SSRS RISK CATEGORY No Risk Low Risk Low Risk      Assessment and Plan:   Lisa Herrera is a 25 year old female with a past psychiatric history significant for depression and anxiety who presents to [facility] for medication management.  Spanish interpretive services were utilized during the duration of the encounter.  Patient reports that she is currently taking sertraline 75 mg daily and is requesting refills upon conclusion of the encounter.  Patient endorses the following symptoms during the encounter: fatigue and anxiety.  Patient reports that sertraline 75 mg daily has been helpful in the management of her depression and anxiety and is requesting refills upon completion of the encounter.  Patient's medications will be e-prescribed to pharmacy of choice.  1. PTSD (post-traumatic stress disorder)  - sertraline (ZOLOFT) 25 MG tablet; Take 3 tablets (75 mg total) by mouth daily. For depression  Dispense: 90 tablet; Refill: 1  2. Mild episode of recurrent major depressive disorder (HCC)  - sertraline (ZOLOFT)  25 MG tablet; Take 3 tablets (75 mg total) by mouth daily. For depression  Dispense: 90 tablet; Refill: 1  3. Generalized anxiety disorder  - sertraline (ZOLOFT) 25 MG tablet; Take 3 tablets (75 mg total) by mouth daily. For depression  Dispense: 90 tablet; Refill: 1  Patient to follow-up in 6 weeks  Meta Hatchet, PA 4/22/20228:51 AM

## 2020-07-15 NOTE — Progress Notes (Signed)
   THERAPIST PROGRESS NOTE  Session Time: 53 minutes  Participation Level: Active  Behavioral Response: CasualAlertEuthymic  Type of Therapy: Individual Therapy  Treatment Goals addressed: Anxiety  Interventions: CBT and Supportive  Summary:  Lisa Herrera is a 25 y.o. female who presents for the scheduled session oriented times five, appropriately dressed, and friendly. Client denied hallucinations, delusions, suicidal and homicidal ideations. Client presented with an in-person interpreter for the appointment. Client presents as a walk-in following discharge from [facility] on 05/26/2020 for depression, anxiety and suicidal ideations. Client reported since discharge she feels "50%" better. Client reported she feels better being alone with her children and not having severe panic attacks. Client reported her sleep scheduled has improved although she has to wake up with her baby during the night. Client reported she has no depressive symptoms since starting her medication. Client reported it has caused her to feel concerned because she is "lacking emotion". Client reported she recently had her grandmother's funeral, and she did not cry. Client described concern of the following endorsed symptoms: lack of emotion and decreased libido.  Client was screened for the following SDOH:  GAD 7 : Generalized Anxiety Score 07/15/2020 07/14/2020  Nervous, Anxious, on Edge 0 0  Control/stop worrying 2 0  Worry too much - different things 1 1  Trouble relaxing 0 0  Restless 1 0  Easily annoyed or irritable 0 0  Afraid - awful might happen 0 0  Total GAD 7 Score 4 1  Anxiety Difficulty Somewhat difficult Somewhat difficult   Flowsheet Row Counselor from 07/14/2020 in [facility]  PHQ-9 Total Score 0        Suicidal/Homicidal: Nowithout intent/plan  Therapist Response:  Therapist began the session making introductions and discussing confidentiality. Therapist engaged with  the client asking open ended questions about her current biopsychosocial symptoms since discharge from the hospital. Therapist used CBT ,positive emotional support, and active listening while the client discussed her thoughts and concerns. Therapist completed SDOH with the client. Therapist addressed questions and concerns.     Plan: Client will complete a walk in appointment for psychiatric evaluation and medication management on 07/15/2020. Client will be schedule for ongoing individual therapy.   Diagnosis: Major depressive disorder, recurrent episode, mild with anxious distress    Neena Rhymes Treyshawn Muldrew, LCSW 07/14/2020

## 2020-07-18 ENCOUNTER — Telehealth (HOSPITAL_COMMUNITY): Payer: Self-pay | Admitting: *Deleted

## 2020-07-18 ENCOUNTER — Other Ambulatory Visit (HOSPITAL_COMMUNITY): Payer: Self-pay | Admitting: Psychiatry

## 2020-07-18 DIAGNOSIS — F411 Generalized anxiety disorder: Secondary | ICD-10-CM

## 2020-07-18 DIAGNOSIS — F330 Major depressive disorder, recurrent, mild: Secondary | ICD-10-CM

## 2020-07-18 DIAGNOSIS — F4310 Post-traumatic stress disorder, unspecified: Secondary | ICD-10-CM

## 2020-07-18 MED ORDER — SERTRALINE HCL 25 MG PO TABS
75.0000 mg | ORAL_TABLET | Freq: Every day | ORAL | 1 refills | Status: DC
Start: 2020-07-18 — End: 2020-08-30

## 2020-07-18 NOTE — Telephone Encounter (Signed)
Medication refilled and sent to preferred pharmacy.  Patient can follow-up with her provider with further questions or concerns.

## 2020-07-18 NOTE — Telephone Encounter (Signed)
Rx Stated no Script Received on  07/15/20 for this Medication: sertraline (ZOLOFT) 25 MG tablet 90 tablet 1 07/15/2020 .  Patient states she' been without med's x  3 days is feeling weird/bad.

## 2020-07-19 NOTE — Telephone Encounter (Signed)
Provider acknowledges message.

## 2020-08-05 ENCOUNTER — Ambulatory Visit
Admission: RE | Admit: 2020-08-05 | Discharge: 2020-08-05 | Disposition: A | Discharge status: Home / Self Care | Payer: Medicaid Other | Source: Ambulatory Visit | Attending: Nurse Practitioner | Admitting: Nurse Practitioner

## 2020-08-05 ENCOUNTER — Other Ambulatory Visit: Payer: Self-pay

## 2020-08-05 DIAGNOSIS — N644 Mastodynia: Secondary | ICD-10-CM

## 2020-08-12 ENCOUNTER — Other Ambulatory Visit: Payer: Medicaid Other

## 2020-08-30 ENCOUNTER — Other Ambulatory Visit: Payer: Self-pay

## 2020-08-30 ENCOUNTER — Ambulatory Visit (INDEPENDENT_AMBULATORY_CARE_PROVIDER_SITE_OTHER): Payer: Medicaid Other | Admitting: Physician Assistant

## 2020-08-30 ENCOUNTER — Encounter (HOSPITAL_COMMUNITY): Payer: Self-pay | Admitting: Physician Assistant

## 2020-08-30 DIAGNOSIS — F4310 Post-traumatic stress disorder, unspecified: Secondary | ICD-10-CM | POA: Diagnosis not present

## 2020-08-30 DIAGNOSIS — F330 Major depressive disorder, recurrent, mild: Secondary | ICD-10-CM

## 2020-08-30 DIAGNOSIS — F411 Generalized anxiety disorder: Secondary | ICD-10-CM

## 2020-08-30 MED ORDER — SERTRALINE HCL 50 MG PO TABS: 50.0000 mg | ORAL_TABLET | Freq: Every day | ORAL | 1 refills | Status: DC

## 2020-08-30 NOTE — Progress Notes (Signed)
BH MD/PA/NP OP Progress Note  08/30/2020 9:56 AM Lisa Herrera  MRN:  [id number]  Chief Complaint:  Chief Complaint    Medication Management     HPI:   Lisa Herrera is a 25 year old female with a past psychiatric history significant for PTSD, major depressive disorder, and generalized anxiety disorder who presents to [facility] for follow-up and medication management.  Due to patient speaking Spanish, a Spanish interpreter was present during the duration of the encounter.  Patient is currently being managed on the following medication: Sertraline 75 mg daily.  Patient states that she has only been taking 2 tablets/day (50 mg total) of her sertraline.  She states that sertraline 50 mg has been beneficial in the management of her depression and anxiety.  Patient reports that although her depression is being managed, there are instances where she does not feel any emotion.  Patient also expresses that she has been experiencing weight gain and anorgasm.  Patient was informed that weight gain and sexual dysfunctions were common side effects when taking antidepressants, especially SSRIs.  Patient also expressed concerns over the drowsiness she has experienced since taking the medication.  Patient was informed that it is not uncommon to experience sedation/drowsiness after taking Zoloft.  Patient denies wanting to switch over to a new antidepressant and would like to continue taking sertraline 50 mg daily for the management of her depression and anxiety.  Patient is requesting refills following the conclusion of the encounter.  Patient is pleasant, calm, cooperative, and fully engaged in conversation during the encounter.  Patient reports good mood.  Patient denies suicidal or homicidal ideations.  Patient further denies auditory or visual hallucinations and does not appear to be responding to internal/external stimuli.  Patient endorses good sleep and receives on  average 6 to 7 hours of sleep each night.  Patient endorses good appetite and eats on average 3 meals per day.  Patient has noticed that she has been eating larger portions during her meals at times.  Patient denies alcohol consumption, tobacco use, and illicit drug use.  Visit Diagnosis:    ICD-10-CM   1. PTSD (post-traumatic stress disorder)  F43.10 sertraline (ZOLOFT) 50 MG tablet  2. Mild episode of recurrent major depressive disorder (HCC)  F33.0 sertraline (ZOLOFT) 50 MG tablet  3. Generalized anxiety disorder  F41.1 sertraline (ZOLOFT) 50 MG tablet    Past Psychiatric History:  Major depressive disorder Generalized anxiety disorder PTSD  Past Medical History:  Past Medical History:  Diagnosis Date  . Alpha thalassemia silent carrier 05/29/2019  . Gallstones   . Hepatic steatosis 02/04/2020  . Migraine   . Umbilical hernia without obstruction and without gangrene 02/04/2020    Past Surgical History:  Procedure Laterality Date  . CESAREAN SECTION N/A 01/14/2016   Procedure: CESAREAN SECTION;  Surgeon: Tereso Newcomer, MD;  Location: [facility];  Service: Obstetrics;  Laterality: N/A;  . CESAREAN SECTION N/A 11/17/2019   Procedure: CESAREAN SECTION;  Surgeon: Levie Heritage, DO;  Location: [facility];  Service: Obstetrics;  Laterality: N/A;  . CHOLECYSTECTOMY    . TUBAL LIGATION Bilateral 11/17/2019   Procedure: BILATERAL TUBAL LIGATION;  Surgeon: Levie Heritage, DO;  Location: [facility];  Service: Obstetrics;  Laterality: Bilateral;    Family Psychiatric History:  Grandfather - patient states that her grandfather may have been schizophrenic but he was never officially diagnosed  Brother - patient states that her brother has been assessed for schizophrenia  but has never been diagnosed  Family History:  Family History  Problem Relation Age of Onset  . Breast cancer Maternal Aunt        50's  . Colon cancer Neg Hx   . Stomach cancer Neg Hx   . Esophageal  cancer Neg Hx     Social History:  Social History   Socioeconomic History  . Marital status: Married    Spouse name: Not on file  . Number of children: 1  . Years of education: Not on file  . Highest education level: Not on file  Occupational History  . Not on file  Tobacco Use  . Smoking status: Never Smoker  . Smokeless tobacco: Never Used  Vaping Use  . Vaping Use: Never used  Substance and Sexual Activity  . Alcohol use: No    Alcohol/week: 0.0 standard drinks  . Drug use: No  . Sexual activity: Yes    Birth control/protection: None  Other Topics Concern  . Not on file  Social History Narrative   Married, originally from [place], Spanish-speaking.   Son born October 2021 and a daughter born in 2017   No alcohol no tobacco or drug use   Social Determinants of Corporate investment banker Strain: Not on file  Food Insecurity: Not on file  Transportation Needs: Not on file  Physical Activity: Not on file  Stress: Not on file  Social Connections: Not on file    Allergies:  Allergies  Allergen Reactions  . Bupropion     Anxiousness Tachycardia    Metabolic Disorder Labs: Lab Results  Component Value Date   HGBA1C 5.3 05/25/2020   MPG 105.41 05/25/2020   No results found for: PROLACTIN Lab Results  Component Value Date   CHOL 187 05/25/2020   TRIG 111 05/25/2020   HDL 55 05/25/2020   CHOLHDL 3.4 05/25/2020   VLDL 22 05/25/2020   LDLCALC 110 (H) 05/25/2020   Lab Results  Component Value Date   TSH 1.836 05/25/2020    Therapeutic Level Labs: No results found for: LITHIUM No results found for: VALPROATE No components found for:  CBMZ  Current Medications: Current Outpatient Medications  Medication Sig Dispense Refill  . hydrOXYzine (ATARAX/VISTARIL) 25 MG tablet Take 1 tablet (25 mg total) by mouth every 6 (six) hours as needed for anxiety. 75 tablet 0  . omeprazole (PRILOSEC) 40 MG capsule Take 1 capsule (40 mg total) by mouth daily before  breakfast. 30 capsule 2  . Prenatal Vit-Fe Fumarate-FA (PRENATAL MULTIVITAMIN) TABS tablet Take 1 tablet by mouth daily.     . traZODone (DESYREL) 50 MG tablet Take 1 tablet (50 mg total) by mouth at bedtime. For sleep 30 tablet 0  . sertraline (ZOLOFT) 50 MG tablet Take 1 tablet (50 mg total) by mouth daily. For depression 30 tablet 1   No current facility-administered medications for this visit.     Musculoskeletal: Strength & Muscle Tone: within normal limits Gait & Station: normal Patient leans: N/A  Psychiatric Specialty Exam: Review of Systems  Psychiatric/Behavioral: Negative for decreased concentration, dysphoric mood, hallucinations, self-injury, sleep disturbance and suicidal ideas. The patient is not nervous/anxious and is not hyperactive.     Blood pressure 138/89, pulse 88, height 5' (1.524 m), weight 244 lb (110.7 kg), not currently breastfeeding.Body mass index is 47.65 kg/m.  General Appearance: Well Groomed  Eye Contact:  Good  Speech:  Clear and Coherent and Normal Rate  Volume:  Normal  Mood:  Euthymic  Affect:  Appropriate  Thought Process:  Coherent, Goal Directed and Descriptions of Associations: Intact  Orientation:  Full (Time, Place, and Person)  Thought Content: WDL   Suicidal Thoughts:  No  Homicidal Thoughts:  No  Memory:  Immediate;   Good Recent;   Good Remote;   Good  Judgement:  Good  Insight:  Good  Psychomotor Activity:  Normal  Concentration:  Concentration: Good and Attention Span: Good  Recall:  Good  Fund of Knowledge: Good  Language: Good  Akathisia:  NA  Handed:  Right  AIMS (if indicated): not done  Assets:  Communication Skills Desire for Improvement Housing Intimacy Social Support Vocational/Educational  ADL's:  Intact  Cognition: WNL  Sleep:  Good   Screenings: AIMS   Flowsheet Row [facility]  AIMS Total Score 0    AUDIT   Flowsheet Row  [facility]  Alcohol Use Disorder Identification Test Final Score (AUDIT) 0    GAD-7   Flowsheet Row Office Visit from 08/30/2020 in [facility] Clinical Support from 07/15/2020 in [facility] Counselor from 07/14/2020 in [facility]  Total GAD-7 Score 2 4 1     PHQ2-9   Flowsheet Row Office Visit from 08/30/2020 in [facility] Clinical Support from 07/15/2020 in [facility] Counselor from 07/14/2020 in [facility]  PHQ-2 Total Score 0 0 0  PHQ-9 Total Score -- -- 0    Flowsheet Row Office Visit from 08/30/2020 in [facility] Clinical Support from 07/15/2020 in [facility] Counselor from 07/14/2020 in [facility]  C-SSRS RISK CATEGORY No Risk No Risk No Risk       Assessment and Plan:   Lisa Herrera is a 25 year old female with a past psychiatric history significant for PTSD, major depressive disorder, and generalized anxiety disorder who presents to [facility] for follow-up and medication management.  Due to patient speaking Spanish, a Spanish interpreter was present during the duration of the encounter.  Patient reports experiencing the following side effects since taking sertraline: weight gain, sexual dysfunction, and drowsiness.  Patient states that she would like to continue taking the medication but is open to switching over to a new medication if her side effects worsen over time.  Patient was encouraged to incorporate whole foods into her diet as well as getting at least 30 minutes a day of moderate to high intensity exercise to help manage weight.  Patient is requesting refills upon conclusion of the encounter.  Patient's  medications to be e-prescribed to pharmacy of choice.  1. PTSD (post-traumatic stress disorder)  - sertraline (ZOLOFT) 50 MG tablet; Take 1 tablet (50 mg total) by mouth daily. For depression  Dispense: 30 tablet; Refill: 1  2. Mild episode of recurrent major depressive disorder (HCC)  - sertraline (ZOLOFT) 50 MG tablet; Take 1 tablet (50 mg total) by mouth daily. For depression  Dispense: 30 tablet; Refill: 1  3. Generalized anxiety disorder  - sertraline (ZOLOFT) 50 MG tablet; Take 1 tablet (50 mg total) by mouth daily. For depression  Dispense: 30 tablet; Refill: 1  Patient to follow up in 2 months  [facility], [facility] 08/30/2020, 9:56 AM

## 2020-09-01 ENCOUNTER — Other Ambulatory Visit: Payer: Self-pay

## 2020-09-01 ENCOUNTER — Ambulatory Visit (INDEPENDENT_AMBULATORY_CARE_PROVIDER_SITE_OTHER): Payer: Medicaid Other | Admitting: Clinical

## 2020-09-01 DIAGNOSIS — F330 Major depressive disorder, recurrent, mild: Secondary | ICD-10-CM | POA: Diagnosis not present

## 2020-09-01 NOTE — Progress Notes (Signed)
   THERAPIST PROGRESS NOTE Virtual Visit via Telephone Note  I connected with Lisa Herrera on 09/01/20 at 11:00 AM EDT by telephone and verified that I am speaking with the correct person using two identifiers.  Location: Patient: home Provider: office   I discussed the limitations, risks, security and privacy concerns of performing an evaluation and management service by telephone and the availability of in person appointments. I also discussed with the patient that there may be a patient responsible charge related to this service. The patient expressed understanding and agreed to proceed.   Follow Up Instructions: I discussed the assessment and treatment plan with the patient. The patient was provided an opportunity to ask questions and all were answered. The patient agreed with the plan and demonstrated an understanding of the instructions.   The patient was advised to call back or seek an in-person evaluation if the symptoms worsen or if the condition fails to improve as anticipated.   Session Time: 25 minutes  Participation Level: Active  Behavioral Response: NAAlertEuthymic  Type of Therapy: Individual Therapy  Treatment Goals addressed: Anxiety  Interventions: CBT and Supportive  Summary:  Lisa Herrera is a 25 y.o. female who presents for the scheduled session oriented times five and friendly. Client denied hallucinations and delusions. Client was assisted with an interpreter on three way conference call. Client reported on today she is doing well. Client reported since the last session she has continued to do well on her medications. Client reported however she does feel like her emotions are suppressed. Client reported she does not feel like she grieved her grandmothers passing and is not dealing with other news of sick family members in a way that she would expect emotionally. Client reported she wonders if she will have to take her medication forever. Client reported  concerns about inability to express her emotions. Client reported otherwise she has seen improvement marked by no thoughts of self harm or feelings of depression. Client reported she is doing well with self care such as bathing and eating. Client reported she does make time to do things outside of her home with her family.   Suicidal/Homicidal: Nowithout intent/plan  Therapist Response:  Therapist began the session checking in and asking how she has been doing since last seen. Therapist active listened and used positive emotional support while she reported her thoughts and feelings. Therapist used CBT and asked open ended questions about the clients medication compliance and response to medication. Therapist used CBT and engaged with the client to ask open ended questions about her difficulty with expressing emotions. Therapist assigned the client homework to take time to write and or give time to think about how certain situations makes her feel regardless of her external expression. Client was scheduled for next appointment.     Plan: Return again in 5 weeks.  Diagnosis: Mild episode of recurrent major depressive disorder   Neena Rhymes Estuardo Frisbee, LCSW 09/01/2020

## 2020-09-20 ENCOUNTER — Other Ambulatory Visit: Payer: Self-pay

## 2020-09-22 ENCOUNTER — Ambulatory Visit (HOSPITAL_COMMUNITY): Payer: Self-pay | Admitting: Clinical

## 2020-09-28 ENCOUNTER — Other Ambulatory Visit (HOSPITAL_COMMUNITY): Payer: Self-pay | Admitting: Psychiatry

## 2020-09-28 DIAGNOSIS — F4310 Post-traumatic stress disorder, unspecified: Secondary | ICD-10-CM

## 2020-09-28 DIAGNOSIS — F411 Generalized anxiety disorder: Secondary | ICD-10-CM

## 2020-09-28 DIAGNOSIS — F330 Major depressive disorder, recurrent, mild: Secondary | ICD-10-CM

## 2020-10-07 ENCOUNTER — Ambulatory Visit: Payer: Self-pay | Admitting: Surgery

## 2020-10-07 NOTE — H&P (Signed)
REFERRING PHYSICIAN:  Dennie Fetters, MD   PROVIDER:  Lissa Morales, MD   MRN: [id number] DOB: [birth year] DATE OF ENCOUNTER: 10/07/2020   Subjective    Chief Complaint: hernia        History of Present Illness: Lisa Herrera is a 25 y.o. female who is seen today as an office consultation at the request of Dr. Jacqulyn Bath for evaluation of hernia  .   The patient understands a fair amount of English but her husband is also translating for her.   Patient is a 25 year old female who presents after a laparoscopic cholecystectomy in March 2018 by Dr. Johna Sheriff.  The patient became pregnant with her second child in 2021.  During that pregnancy she developed an umbilical hernia.  Since delivering, the hernia has persisted.  It seems to be enlarging.  It is causing more discomfort.  She denies any obstructive symptoms.  When she is supine it seems to be reducible.  She presents to [place] today to discuss repair of his hernia.     Review of Systems: A complete review of systems was obtained from the patient.  I have reviewed this information and discussed as appropriate with the patient.  See HPI as well for other ROS.   Review of Systems  Constitutional: Negative.   HENT: Negative.   Eyes: Negative.   Respiratory: Negative.   Cardiovascular: Negative.   Gastrointestinal: Positive for abdominal pain.  Genitourinary: Negative.   Musculoskeletal: Negative.   Skin: Negative.   Neurological: Negative.   Endo/Heme/Allergies: Negative.   Psychiatric/Behavioral: Negative.         Medical History: Past Medical History      Past Medical History:  Diagnosis Date   Anxiety             Patient Active Problem List  Diagnosis   Axillary pain, left   BMI 50.0-59.9, adult (CMS-HCC)   Cesarean delivery delivered   Diarrhea   Gastroesophageal reflux disease with esophagitis without hemorrhage   Hepatic steatosis   History of bilateral tubal ligation   Hx of cholelithiasis   Hypertrophy  of nasal turbinates   Language barrier   Left sided abdominal pain, unspecified   Maternal varicella, non-immune   MDD (major depressive disorder), recurrent episode, moderate (CMS-HCC)   Mild episode of recurrent major depressive disorder (CMS-HCC)   MDD (major depressive episode), single episode, severe, no psychosis (CMS-HCC)   Morbid obesity (CMS-HCC)   Obesity affecting pregnancy in third trimester, antepartum   Mucus in stool   Obstructive sleep apnea of adult   Generalized anxiety disorder   Postpartum care following cesarean delivery   Primary snoring   S/P cesarean section   Breathing-related sleep disorder   Suicidal ideation   Supervision of other normal pregnancy, antepartum   Umbilical hernia without obstruction and without gangrene   Sleep apnea      Past Surgical History       Past Surgical History:  Procedure Laterality Date   CESAREAN SECTION N/A     gallbladder surgery  N/A     TONSILLECTOMY N/A          Allergies       Allergies  Allergen Reactions   Bupropion Unknown      Anxiousness Tachycardia              Current Outpatient Medications on File Prior to Visit  Medication Sig Dispense Refill   sertraline (ZOLOFT) 25 MG tablet TAKE 3 TABLETS(75 MG) BY MOUTH DAILY  FOR DEPRESSION        No current facility-administered medications on file prior to visit.      Family History       Family History  Problem Relation Age of Onset   Breast cancer Mother          Social History       Tobacco Use  Smoking Status Never Smoker  Smokeless Tobacco Never Used      Social History  Social History        Socioeconomic History   Marital status: Married  Tobacco Use   Smoking status: Never Smoker   Smokeless tobacco: Never Used  Building services engineer Use: Never used  Substance and Sexual Activity   Alcohol use: Never   Drug use: Never        Objective:         Vitals:    10/07/20 1025  BP: 132/62  Pulse: 94  Temp: 36.8 C (98.2  F)  SpO2: 100%  Weight: (!) 110.9 kg (244 lb 6.4 oz)  Height: 157.5 cm (5\' 2" )    Body mass index is 44.7 kg/m.   Physical Exam    Constitutional:  WDWN in NAD, conversant, no obvious deformities; lying in bed comfortably Eyes:  Pupils equal, round; sclera anicteric; moist conjunctiva; no lid lag HENT:  Oral mucosa moist; good dentition  Neck:  No masses palpated, trachea midline; no thyromegaly Lungs:  CTA bilaterally; normal respiratory effort Breasts:  CV:  Regular rate and rhythm; no murmurs; extremities well-perfused with no edema Abd:  +bowel sounds,obese,  soft, non-tender, no palpable organomegaly; palpable hernia at the upper edge of the umbilicus which is partially reducible. Musc:  Unable to assess gait; no apparent clubbing or cyanosis in extremities Lymphatic:  No palpable cervical or axillary lymphadenopathy Skin:  Warm, dry; no sign of jaundice Psychiatric - alert and oriented x 4; calm mood and affect     Labs, Imaging and Diagnostic Testing: CT scan in October 2021 shows a fat-containing umbilical hernia   Assessment and Plan:  Diagnoses and all orders for this visit:   Umbilical hernia without obstruction or gangrene   Umbilical hernia repair with mesh  The surgical procedure has been discussed with the patient.  Potential risks, benefits, alternative treatments, and expected outcomes have been explained.  All of the patient's questions at this time have been answered.  The likelihood of reaching the patient's treatment goal is good.  The patient understand the proposed surgical procedure and wishes to proceed.       November 2021, MD  10/07/2020 5:09 PM

## 2020-10-13 ENCOUNTER — Telehealth (INDEPENDENT_AMBULATORY_CARE_PROVIDER_SITE_OTHER): Payer: Medicaid Other | Admitting: Physician Assistant

## 2020-10-13 ENCOUNTER — Encounter (HOSPITAL_COMMUNITY): Payer: Self-pay | Admitting: Physician Assistant

## 2020-10-13 DIAGNOSIS — F331 Major depressive disorder, recurrent, moderate: Secondary | ICD-10-CM | POA: Diagnosis not present

## 2020-10-13 DIAGNOSIS — F411 Generalized anxiety disorder: Secondary | ICD-10-CM | POA: Diagnosis not present

## 2020-10-13 DIAGNOSIS — F4310 Post-traumatic stress disorder, unspecified: Secondary | ICD-10-CM

## 2020-10-13 MED ORDER — SERTRALINE HCL 25 MG PO TABS: 75.0000 mg | ORAL_TABLET | Freq: Every day | ORAL | 1 refills | Status: DC

## 2020-10-13 NOTE — Progress Notes (Signed)
BH MD/PA/NP OP Progress Note  Virtual Visit via Telephone Note  I connected with Lisa Herrera on 10/13/20 at  5:00 PM EDT by telephone and verified that I am speaking with the correct person using two identifiers.  Location: Patient: Home Provider: Clinic   I discussed the limitations, risks, security and privacy concerns of performing an evaluation and management service by telephone and the availability of in person appointments. I also discussed with the patient that there may be a patient responsible charge related to this service. The patient expressed understanding and agreed to proceed.  Follow Up Instructions:  I discussed the assessment and treatment plan with the patient. The patient was provided an opportunity to ask questions and all were answered. The patient agreed with the plan and demonstrated an understanding of the instructions.   The patient was advised to call back or seek an in-person evaluation if the symptoms worsen or if the condition fails to improve as anticipated.  I provided 40 minutes of non-face-to-face time during this encounter.  Lisa Hatchet, PA   10/13/2020 10:06 PM Lisa Herrera  MRN:  [id number]  Chief Complaint: Follow up and medication management  HPI:   Lisa Herrera is a 25 year old female with a past psychiatric history significant for major depressive disorder, generalized anxiety disorder, and PTSD who presents to [facility] for follow-up and medication management.  Due to patient speaking Spanish, a Spanish interpreter was present during the duration of the encounter.  Patient is currently being managed on the following medication: Sertraline 50 mg daily.  Patient reports that she has not been feeling well but she is still hanging in there.  She reports that when she first started using her sertraline it was helpful but 2 weeks ago, she started experiencing panic attacks and crying spells.  Patient  endorses the following depressive symptoms: depressed mood, lack of motivation, sleep disturbances, and feelings of worthlessness/guilt.  Patient denies decreased energy or changes in appetite.  Patient rates her anxiety a 7 out of 10 and is unsure of what is triggering her anxiety.  She does express that whenever she tries to go out with her family, she begins to feel anxious when around people and randomly starts to lose her breath.  PHQ-9 screen was performed with the patient scoring a 14.  A GAD-7 screen was also performed with the patient scoring an 8  Patient is calm, cooperative, and fully engaged in conversation during the encounter.  Patient states that her anxiety comes in waves.  At one point, she will have anxiety then on the next point, patient will forget about her anxiety only for it to return again.  Patient denies active suicidal ideations but states that she had thoughts of harming herself this morning.  Patient denies homicidal ideations.  She further denies auditory or visual hallucinations and does not appear to be responding to internal/external stimuli.  Patient endorses fair sleep and receives on average 5 to 6 hours of sleep each night.  Patient states that she occasionally has to wake up to tend to the baby.  Patient endorses decreased appetite and eats on average 2 meals per day.  Patient denies alcohol consumption, tobacco use, and illicit drug use.  Patient expressed concerns with her sertraline interacting with her anesthetic medication she will be receiving for her upcoming surgery.  Patient was informed that sertraline should not be cross reactive to the anesthetic they will be using for the procedure.  Patient  vocalized her understanding.  Visit Diagnosis:    ICD-10-CM   1. PTSD (post-traumatic stress disorder)  F43.10 sertraline (ZOLOFT) 25 MG tablet    2. Moderate episode of recurrent major depressive disorder (HCC)  F33.1 sertraline (ZOLOFT) 25 MG tablet    3.  Generalized anxiety disorder  F41.1 sertraline (ZOLOFT) 25 MG tablet      Past Psychiatric History:  Major depressive disorder Generalized anxiety disorder PTSD  Past Medical History:  Past Medical History:  Diagnosis Date   Alpha thalassemia silent carrier 05/29/2019   Gallstones    Hepatic steatosis 02/04/2020   Migraine    Umbilical hernia without obstruction and without gangrene 02/04/2020    Past Surgical History:  Procedure Laterality Date   CESAREAN SECTION N/A 01/14/2016   Procedure: CESAREAN SECTION;  Surgeon: Tereso NewcomerUgonna A Anyanwu, MD;  [facility];  Service: Obstetrics;  Laterality: N/A;   CESAREAN SECTION N/A 11/17/2019   Procedure: CESAREAN SECTION;  Surgeon: Levie HeritageStinson, Jacob J, DO;  Location: [facility];  Service: Obstetrics;  Laterality: N/A;   CHOLECYSTECTOMY     TUBAL LIGATION Bilateral 11/17/2019   Procedure: BILATERAL TUBAL LIGATION;  Surgeon: Levie HeritageStinson, Jacob J, DO;  Location: [facility];  Service: Obstetrics;  Laterality: Bilateral;    Family Psychiatric History:  Grandfather - patient states that her grandfather may have been schizophrenic but he was never officially diagnosed   Brother - patient states that her brother has been assessed for schizophrenia but has never been diagnosed  Family History:  Family History  Problem Relation Age of Onset   Breast cancer Maternal Aunt        50's   Colon cancer Neg Hx    Stomach cancer Neg Hx    Esophageal cancer Neg Hx     Social History:  Social History   Socioeconomic History   Marital status: Married    Spouse name: Not on file   Number of children: 1   Years of education: Not on file   Highest education level: Not on file  Occupational History   Not on file  Tobacco Use   Smoking status: Never   Smokeless tobacco: Never  Vaping Use   Vaping Use: Never used  Substance and Sexual Activity   Alcohol use: No    Alcohol/week: 0.0 standard drinks   Drug use: No   Sexual activity: Yes     Birth control/protection: None  Other Topics Concern   Not on file  Social History Narrative   Married, originally from [place], Spanish-speaking.   Son born October 2021 and a daughter born in 2017   No alcohol no tobacco or drug use   Social Determinants of Corporate investment bankerHealth   Financial Resource Strain: Not on file  Food Insecurity: Not on file  Transportation Needs: Not on file  Physical Activity: Not on file  Stress: Not on file  Social Connections: Not on file    Allergies:  Allergies  Allergen Reactions   Bupropion     Anxiousness Tachycardia    Metabolic Disorder Labs: Lab Results  Component Value Date   HGBA1C 5.3 05/25/2020   MPG 105.41 05/25/2020   No results found for: PROLACTIN Lab Results  Component Value Date   CHOL 187 05/25/2020   TRIG 111 05/25/2020   HDL 55 05/25/2020   CHOLHDL 3.4 05/25/2020   VLDL 22 05/25/2020   LDLCALC 110 (H) 05/25/2020   Lab Results  Component Value Date   TSH 1.836 05/25/2020  Therapeutic Level Labs: No results found for: LITHIUM No results found for: VALPROATE No components found for:  CBMZ  Current Medications: Current Outpatient Medications  Medication Sig Dispense Refill   hydrOXYzine (ATARAX/VISTARIL) 25 MG tablet Take 1 tablet (25 mg total) by mouth every 6 (six) hours as needed for anxiety. 75 tablet 0   omeprazole (PRILOSEC) 40 MG capsule Take 1 capsule (40 mg total) by mouth daily before breakfast. 30 capsule 2   Prenatal Vit-Fe Fumarate-FA (PRENATAL MULTIVITAMIN) TABS tablet Take 1 tablet by mouth daily.      sertraline (ZOLOFT) 25 MG tablet Take 3 tablets (75 mg total) by mouth daily. 90 tablet 1   traZODone (DESYREL) 50 MG tablet Take 1 tablet (50 mg total) by mouth at bedtime. For sleep 30 tablet 0   No current facility-administered medications for this visit.     Musculoskeletal: Strength & Muscle Tone: Unable to assess due to telemedicine visit Gait & Station: Unable to assess due to telemedicine  visit Patient leans: Unable to assess due to telemedicine visit  Psychiatric Specialty Exam: Review of Systems  Psychiatric/Behavioral:  Positive for dysphoric mood and sleep disturbance. Negative for decreased concentration, hallucinations, self-injury and suicidal ideas. The patient is nervous/anxious. The patient is not hyperactive.    not currently breastfeeding.There is no height or weight on file to calculate BMI.  General Appearance: Unable to assess due to telemedicine visit  Eye Contact:  Unable to assess due to telemedicine visit  Speech:  Clear and Coherent and Normal Rate  Volume:  Normal  Mood:  Anxious and Depressed  Affect:  Congruent and Depressed  Thought Process:  Coherent, Goal Directed, and Descriptions of Associations: Intact  Orientation:  Full (Time, Place, and Person)  Thought Content: WDL   Suicidal Thoughts:  No  Homicidal Thoughts:  No  Memory:  Immediate;   Good Recent;   Good Remote;   Good  Judgement:  Good  Insight:  Good  Psychomotor Activity:  Normal  Concentration:  Concentration: Good and Attention Span: Good  Recall:  Good  Fund of Knowledge: Good  Language: Good  Akathisia:  NA  Handed:  Right  AIMS (if indicated): not done  Assets:  Communication Skills Desire for Improvement Housing Intimacy Social Support Vocational/Educational  ADL's:  Intact  Cognition: WNL  Sleep:  Fair   Screenings: AIMS    Flowsheet [facility]  AIMS Total Score 0      AUDIT    Flowsheet Row [facility]  Alcohol Use Disorder Identification Test Final Score (AUDIT) 0      GAD-7    Flowsheet Row Video Visit from 10/13/2020 in [facility] Office Visit from 08/30/2020 in [facility] Clinical Support from 07/15/2020 in [facility] Counselor from 07/14/2020 in [facility]  Total GAD-7 Score 8 2 4 1       PHQ2-9    Flowsheet Row Video Visit from 10/13/2020 in [facility] Office Visit from 08/30/2020 in [facility] Clinical Support from 07/15/2020 in [facility] Counselor from 07/14/2020 in [facility]  PHQ-2 Total Score 3 0 0 0  PHQ-9 Total Score 14 -- -- 0      Flowsheet Row Video Visit from 10/13/2020 in [facility]  Visit from 08/30/2020 in [facility] Clinical Support from 07/15/2020 in [facility]  C-SSRS RISK CATEGORY Low Risk No Risk No Risk        Assessment and Plan:   Chandi Nicklin is a 25 year old female with a past psychiatric history significant for major depressive disorder, generalized anxiety disorder, and PTSD who presents to [facility] for follow-up and medication management.  Patient reports that she has started experiencing worsening depressive symptoms and anxiety mainly characterized by crying spells and panic attacks.  Patient is unable to identify any discernible triggers.  Patient was recommended increasing her sertraline dosage from 50 mg to 75 mg daily for the management of her worsening depression and anxiety.  Patient was hesitant at first but eventually agreed to the plan.  Patient's medication to be e-prescribed to pharmacy of choice.  Patient was encouraged to continue incorporating 30 minutes of moderate to high intensity exercise daily.  Patient was also encouraged to continue eating whole foods while decreasing sugar consumption.  Patient was agreeable to recommendation.  1. PTSD (post-traumatic stress disorder)  - sertraline (ZOLOFT) 25 MG tablet; Take 3 tablets (75 mg total) by mouth daily.  Dispense: 90  tablet; Refill: 1  2. Moderate episode of recurrent major depressive disorder (HCC)  - sertraline (ZOLOFT) 25 MG tablet; Take 3 tablets (75 mg total) by mouth daily.  Dispense: 90 tablet; Refill: 1  3. Generalized anxiety disorder  - sertraline (ZOLOFT) 25 MG tablet; Take 3 tablets (75 mg total) by mouth daily.  Dispense: 90 tablet; Refill: 1  Patient to follow up in 2 months Provider spent a total of 40 minutes with the patient/reviewing patient's chart  Lisa Hatchet, PA 10/13/2020, 10:06 PM

## 2020-11-01 ENCOUNTER — Telehealth (HOSPITAL_COMMUNITY): Payer: Medicaid Other | Admitting: Physician Assistant

## 2020-11-09 ENCOUNTER — Other Ambulatory Visit: Payer: Self-pay | Admitting: Surgery

## 2020-12-01 ENCOUNTER — Telehealth (HOSPITAL_COMMUNITY): Payer: Self-pay | Admitting: *Deleted

## 2020-12-01 NOTE — Telephone Encounter (Signed)
PATIENT  HAS BEEN APPROVED  sertraline (ZOLOFT) 25 MG tablet PLAN # [id number] ID # [id number].  PA# [id number]  EFFECTIVE: 11-29-20

## 2020-12-15 ENCOUNTER — Telehealth (INDEPENDENT_AMBULATORY_CARE_PROVIDER_SITE_OTHER): Payer: Medicaid Other | Admitting: Physician Assistant

## 2020-12-15 DIAGNOSIS — F411 Generalized anxiety disorder: Secondary | ICD-10-CM

## 2020-12-15 DIAGNOSIS — F4310 Post-traumatic stress disorder, unspecified: Secondary | ICD-10-CM | POA: Diagnosis not present

## 2020-12-15 DIAGNOSIS — F331 Major depressive disorder, recurrent, moderate: Secondary | ICD-10-CM

## 2020-12-23 ENCOUNTER — Encounter (HOSPITAL_COMMUNITY): Payer: Self-pay | Admitting: Physician Assistant

## 2020-12-23 MED ORDER — SERTRALINE HCL 50 MG PO TABS: 50.0000 mg | ORAL_TABLET | Freq: Every day | ORAL | 2 refills | Status: DC

## 2020-12-23 NOTE — Progress Notes (Addendum)
BH MD/PA/NP OP Progress Note  Virtual Visit via Telephone Note  I connected with Lisa Herrera on 12/23/20 at  5:00 PM EDT by telephone and verified that I am speaking with the correct person using two identifiers.  Location: Patient: Home Provider: Clinic   I discussed the limitations, risks, security and privacy concerns of performing an evaluation and management service by telephone and the availability of in person appointments. I also discussed with the patient that there may be a patient responsible charge related to this service. The patient expressed understanding and agreed to proceed.  Follow Up Instructions:   I discussed the assessment and treatment plan with the patient. The patient was provided an opportunity to ask questions and all were answered. The patient agreed with the plan and demonstrated an understanding of the instructions.   The patient was advised to call back or seek an in-person evaluation if the symptoms worsen or if the condition fails to improve as anticipated.  I provided 26 minutes of non-face-to-face time during this encounter.  Lisa Hatchet, PA   12/23/2020 2:24 AM Lisa Herrera  MRN:  [id number]  Chief Complaint: Follow up and medication management  HPI:   Lisa Herrera is a 25 year old female with a past psychiatric history significant for PTSD, major depressive episode, and generalized anxiety disorder who presents to [facility] behavioral health outpatient clinic via virtual telephone visit for follow-up and medication management.  Spanish interpretive services were utilized during the entirety of the encounter via telephone.  Patient is currently being managed on the following medication: Zoloft 75 mg daily.  Patient reports no issues or concerns regarding her current medication regimen.  Patient denies the need for dosage adjustments at this time and is requesting refills on her medication.  Patient states that she is taking her medication  as prescribed and has been feeling fine.  Patient denies experiencing any adverse side effects.  Patient denies depressive symptoms nor has she been experiencing anxiety.  Patient denies any new stressors at this time.  A PHQ-9 screen was performed with the patient scoring an 11.  A GAD-7 screen was also performed with the patient scoring a 6.  Patient is alert and oriented x4, calm, cooperative, and fully engaged in conversation during the encounter.  Patient states that she feels fine and relaxed.  Patient denies suicidal or homicidal ideations.  She further denies auditory or visual hallucinations and does not appear to be responding to internal/external stimuli.  Patient endorses good appetite and eats on average 2 meals per day.  Patient denies alcohol consumption, tobacco use, and illicit drug use.  Visit Diagnosis:    ICD-10-CM   1. PTSD (post-traumatic stress disorder)  F43.10 sertraline (ZOLOFT) 50 MG tablet    2. Moderate episode of recurrent major depressive disorder (HCC)  F33.1 sertraline (ZOLOFT) 50 MG tablet    3. Generalized anxiety disorder  F41.1 sertraline (ZOLOFT) 50 MG tablet      Past Psychiatric History:  Major depressive disorder Generalized anxiety disorder PTSD  Past Medical History:  Past Medical History:  Diagnosis Date   Alpha thalassemia silent carrier 05/29/2019   Gallstones    Hepatic steatosis 02/04/2020   Migraine    Umbilical hernia without obstruction and without gangrene 02/04/2020    Past Surgical History:  Procedure Laterality Date   CESAREAN SECTION N/A 01/14/2016   Procedure: CESAREAN SECTION;  Surgeon: Tereso Newcomer, MD;  [facility];  Service: Obstetrics;  Laterality: N/A;   CESAREAN  SECTION N/A 11/17/2019   Procedure: CESAREAN SECTION;  Surgeon: Levie Heritage, DO;  Location: [facility];  Service: Obstetrics;  Laterality: N/A;   CHOLECYSTECTOMY     TUBAL LIGATION Bilateral 11/17/2019   Procedure: BILATERAL TUBAL  LIGATION;  Surgeon: Levie Heritage, DO;  Location: [facility];  Service: Obstetrics;  Laterality: Bilateral;    Family Psychiatric History:  Grandfather - patient states that her grandfather may have been schizophrenic but he was never officially diagnosed   Brother - patient states that her brother has been assessed for schizophrenia but has never been diagnosed  Family History:  Family History  Problem Relation Age of Onset   Breast cancer Maternal Aunt        50's   Colon cancer Neg Hx    Stomach cancer Neg Hx    Esophageal cancer Neg Hx     Social History:  Social History   Socioeconomic History   Marital status: Married    Spouse name: Not on file   Number of children: 1   Years of education: Not on file   Highest education level: Not on file  Occupational History   Not on file  Tobacco Use   Smoking status: Never   Smokeless tobacco: Never  Vaping Use   Vaping Use: Never used  Substance and Sexual Activity   Alcohol use: No    Alcohol/week: 0.0 standard drinks   Drug use: No   Sexual activity: Yes    Birth control/protection: None  Other Topics Concern   Not on file  Social History Narrative   Married, originally from [place], Spanish-speaking.   Son born October 2021 and a daughter born in 2017   No alcohol no tobacco or drug use   Social Determinants of Corporate investment banker Strain: Not on file  Food Insecurity: Not on file  Transportation Needs: Not on file  Physical Activity: Not on file  Stress: Not on file  Social Connections: Not on file    Allergies:  Allergies  Allergen Reactions   Bupropion     Anxiousness Tachycardia    Metabolic Disorder Labs: Lab Results  Component Value Date   HGBA1C 5.3 05/25/2020   MPG 105.41 05/25/2020   No results found for: PROLACTIN Lab Results  Component Value Date   CHOL 187 05/25/2020   TRIG 111 05/25/2020   HDL 55 05/25/2020   CHOLHDL 3.4 05/25/2020   VLDL 22 05/25/2020   LDLCALC 110  (H) 05/25/2020   Lab Results  Component Value Date   TSH 1.836 05/25/2020    Therapeutic Level Labs: No results found for: LITHIUM No results found for: VALPROATE No components found for:  CBMZ  Current Medications: Current Outpatient Medications  Medication Sig Dispense Refill   hydrOXYzine (ATARAX/VISTARIL) 25 MG tablet Take 1 tablet (25 mg total) by mouth every 6 (six) hours as needed for anxiety. 75 tablet 0   omeprazole (PRILOSEC) 40 MG capsule Take 1 capsule (40 mg total) by mouth daily before breakfast. 30 capsule 2   Prenatal Vit-Fe Fumarate-FA (PRENATAL MULTIVITAMIN) TABS tablet Take 1 tablet by mouth daily.      sertraline (ZOLOFT) 50 MG tablet Take 1 tablet (50 mg total) by mouth daily. 30 tablet 2   traZODone (DESYREL) 50 MG tablet Take 1 tablet (50 mg total) by mouth at bedtime. For sleep 30 tablet 0   No current facility-administered medications for this visit.     Musculoskeletal: Strength & Muscle Tone: Unable  to assess due to telemedicine visit Gait & Station: Unable to assess due to telemedicine visit Patient leans: Unable to assess due to telemedicine visit  Psychiatric Specialty Exam: Review of Systems  Psychiatric/Behavioral:  Negative for decreased concentration, dysphoric mood, hallucinations, self-injury, sleep disturbance and suicidal ideas. The patient is not nervous/anxious and is not hyperactive.    not currently breastfeeding.There is no height or weight on file to calculate BMI.  General Appearance: Unable to assess due to telemedicine visit  Eye Contact: Unable to assess due to telemedicine visit  Speech:  Clear and Coherent and Normal Rate  Volume:  Normal  Mood:  Euthymic  Affect:  Appropriate  Thought Process:  Coherent, Goal Directed, and Descriptions of Associations: Intact  Orientation:  Full (Time, Place, and Person)  Thought Content: WDL   Suicidal Thoughts:  No  Homicidal Thoughts:  No  Memory:  Immediate;   Good Recent;    Good Remote;   Good  Judgement:  Good  Insight:  Good  Psychomotor Activity:  Normal  Concentration:  Concentration: Good and Attention Span: Good  Recall:  Good  Fund of Knowledge: Fair  Language: Good  Akathisia:  NA  Handed:  Right  AIMS (if indicated): not done  Assets:  Communication Skills Desire for Improvement Financial Resources/Insurance Housing Intimacy Social Support Vocational/Educational  ADL's:  Intact  Cognition: WNL  Sleep:  Good   Screenings: AIMS    Flowsheet Row [facility]  AIMS Total Score 0      AUDIT    Flowsheet Row Admission (Discharged[facility]  Alcohol Use Disorder Identification Test Final Score (AUDIT) 0      GAD-7    Flowsheet Row Video Visit from 12/15/2020 in [facility] Video Visit from 10/13/2020 in [facility] Office Visit from 08/30/2020 in [facility] Clinical Support from 07/15/2020 in [facility] Counselor from 07/14/2020 in [facility]  Total GAD-7 Score 6 8 2 4 1       PHQ2-9    Flowsheet Row Video Visit from 12/15/2020 in [facility] Video Visit from 10/13/2020 in [facility] Office Visit from 08/30/2020 in [facility] Clinical Support from 07/15/2020 in [facility] Counselor from 07/14/2020 in [facility]  PHQ-2 Total Score 2 3 0 0 0  PHQ-9 Total Score 11 14 -- -- 0      Flowsheet Row Video Visit from 12/15/2020 in [facility] Video Visit from 10/13/2020 in [facility] Office Visit from 08/30/2020 in [facility]  C-SSRS RISK CATEGORY No  Risk Low Risk No Risk        Assessment and Plan:   Lisa Herrera is a 25 year old female with a past psychiatric history significant for PTSD, major depressive episode, and generalized anxiety disorder who presents to [facility] behavioral health outpatient clinic via virtual telephone visit for follow-up and medication management.  Spanish interpretive services were utilized during the entirety of the encounter via telephone.  Patient reports no issues or concerns regarding her current medication regimen.  Patient states that she has had issues with her medication dosage and states that her insurance will not cover 75 mg daily.  Patient is comfortable with taking Zoloft 50 mg  daily for the management of her depression and anxiety.  Provider to be e-prescribed patient medication to pharmacy of choice.  1. PTSD (post-traumatic stress disorder)  - sertraline (ZOLOFT) 50 MG tablet; Take 1 tablet (50 mg total) by mouth daily.  Dispense: 30 tablet; Refill: 2  2. Moderate episode of recurrent major depressive disorder (HCC)  - sertraline (ZOLOFT) 50 MG tablet; Take 1 tablet (50 mg total) by mouth daily.  Dispense: 30 tablet; Refill: 2  3. Generalized anxiety disorder  - sertraline (ZOLOFT) 50 MG tablet; Take 1 tablet (50 mg total) by mouth daily.  Dispense: 30 tablet; Refill: 2  Patient to follow up in 2 months Provider spent a total of 26 minutes with the patient/reviewing patient's chart  Lisa Hatchet, PA 12/23/2020, 2:24 AM

## 2021-02-15 ENCOUNTER — Telehealth (INDEPENDENT_AMBULATORY_CARE_PROVIDER_SITE_OTHER): Payer: Medicaid Other | Admitting: Physician Assistant

## 2021-02-15 ENCOUNTER — Encounter (HOSPITAL_COMMUNITY): Payer: Self-pay | Admitting: Physician Assistant

## 2021-02-15 ENCOUNTER — Other Ambulatory Visit: Payer: Self-pay

## 2021-02-15 DIAGNOSIS — F411 Generalized anxiety disorder: Secondary | ICD-10-CM

## 2021-02-15 DIAGNOSIS — F4310 Post-traumatic stress disorder, unspecified: Secondary | ICD-10-CM

## 2021-02-15 DIAGNOSIS — F331 Major depressive disorder, recurrent, moderate: Secondary | ICD-10-CM | POA: Diagnosis not present

## 2021-02-15 MED ORDER — SERTRALINE HCL 100 MG PO TABS: 100.0000 mg | ORAL_TABLET | Freq: Every day | ORAL | 2 refills | Status: DC

## 2021-02-15 MED ORDER — SERTRALINE HCL 25 MG PO TABS: 75.0000 mg | ORAL_TABLET | Freq: Every day | ORAL | 2 refills | Status: DC

## 2021-02-15 NOTE — Progress Notes (Signed)
BH MD/PA/NP OP Progress Note  Virtual Visit via Telephone Note  I connected with Lisa Herrera on 02/15/21 at  4:30 PM EST by telephone and verified that I am speaking with the correct person using two identifiers.  Location: Patient: Home Provider: Clinic   I discussed the limitations, risks, security and privacy concerns of performing an evaluation and management service by telephone and the availability of in person appointments. I also discussed with the patient that there may be a patient responsible charge related to this service. The patient expressed understanding and agreed to proceed.  Follow Up Instructions:  I discussed the assessment and treatment plan with the patient. The patient was provided an opportunity to ask questions and all were answered. The patient agreed with the plan and demonstrated an understanding of the instructions.   The patient was advised to call back or seek an in-person evaluation if the symptoms worsen or if the condition fails to improve as anticipated.  I provided 22 minutes of non-face-to-face time during this encounter.  Meta Hatchet, PA   02/15/2021 5:20 PM Britne Borelli  MRN:  [id number]  Chief Complaint: Follow up and medication management  HPI:   Lisa Herrera is a 25 year old female with a past psychiatric history significant for PTSD, major depressive disorder, and generalized anxiety disorder who presents to [facility] via virtual telephone visit for follow-up and medication management.  Spanish interpretive services were utilized during the duration of the encounter via telephone.  Patient is currently being managed on the following medication: Sertraline 50 mg daily.  Patient reports that she has been doing a little better.  Patient reports that she has been dealing with anxiety in the morning.  She rates her anxiety an 8 out of 10.  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  Patient was stressed due to already failing the exam twice before passing.  Patient denies any depressive symptoms and is requesting refills on her sertraline following the conclusion of the encounter.  A PHQ-9 screen was performed with the patient scoring an 11.  A GAD-7 screen was also performed with the patient scoring a 10.  Patient is alert and oriented x4, calm, cooperative, and fully engaged in conversation during the encounter.  Patient states that she feels tired and has a lot of anxiety.  Patient denies suicidal or homicidal ideations.  She further denies auditory or visual hallucinations and does not appear to be responding to internal/external stimuli.  Patient endorses fair sleep.  She attributes her fair sleep to tending to her baby through the night.  Patient endorses increased appetite and eats on average 4-5 meals per day.  Patient denies alcohol consumption, tobacco use, and illicit drug use.  Visit Diagnosis:    ICD-10-CM   1. PTSD (post-traumatic stress disorder)  F43.10 sertraline (ZOLOFT) 100 MG tablet    DISCONTINUED: sertraline (ZOLOFT) 25 MG tablet    2. Moderate episode of recurrent major depressive disorder (HCC)  F33.1 sertraline (ZOLOFT) 100 MG tablet    DISCONTINUED: sertraline (ZOLOFT) 25 MG tablet    3. Generalized anxiety disorder  F41.1 sertraline (ZOLOFT) 100 MG tablet    DISCONTINUED: sertraline (ZOLOFT) 25 MG tablet      Past Psychiatric History:  Major depressive disorder Generalized anxiety disorder PTSD  Past Medical History:  Past Medical History:  Diagnosis Date   Alpha thalassemia silent carrier 05/29/2019   Gallstones  Hepatic steatosis 02/04/2020   Migraine    Umbilical hernia without obstruction and without gangrene 02/04/2020    Past Surgical History:  Procedure Laterality Date   CESAREAN SECTION N/A 01/14/2016   Procedure: CESAREAN SECTION;  Surgeon: Tereso Newcomer, MD;  [facility];  Service: Obstetrics;  Laterality: N/A;   CESAREAN SECTION N/A 11/17/2019   Procedure: CESAREAN SECTION;  Surgeon: Levie Heritage, DO;  Location: [facility];  Service: Obstetrics;  Laterality: N/A;   CHOLECYSTECTOMY     TUBAL LIGATION Bilateral 11/17/2019   Procedure: BILATERAL TUBAL LIGATION;  Surgeon: Levie Heritage, DO;  Location: [facility];  Service: Obstetrics;  Laterality: Bilateral;    Family Psychiatric History:  Grandfather - patient states that her grandfather may have been schizophrenic but he was never officially diagnosed   Brother - patient states that her brother has been assessed for schizophrenia but has never been diagnosed  Family History:  Family History  Problem Relation Age of Onset   Breast cancer Maternal Aunt        50's   Colon cancer Neg Hx    Stomach cancer Neg Hx    Esophageal cancer Neg Hx     Social History:  Social History   Socioeconomic History   Marital status: Married    Spouse name: Not on file   Number of children: 1   Years of education: Not on file   Highest education level: Not on file  Occupational History   Not on file  Tobacco Use   Smoking status: Never   Smokeless tobacco: Never  Vaping Use   Vaping Use: Never used  Substance and Sexual Activity   Alcohol use: No    Alcohol/week: 0.0 standard drinks   Drug use: No   Sexual activity: Yes    Birth control/protection: None  Other Topics Concern   Not on file  Social History Narrative   Married, originally from [place], Spanish-speaking.   Son born October 2021 and a daughter born in 2017   No alcohol no tobacco or drug use   Social Determinants of Corporate investment banker Strain: Not on file  Food Insecurity: Not on file  Transportation Needs: Not on file  Physical Activity: Not on file  Stress: Not on file  Social Connections: Not on file    Allergies:  Allergies  Allergen Reactions   Bupropion     Anxiousness Tachycardia    Metabolic Disorder  Labs: Lab Results  Component Value Date   HGBA1C 5.3 05/25/2020   MPG 105.41 05/25/2020   No results found for: PROLACTIN Lab Results  Component Value Date   CHOL 187 05/25/2020   TRIG 111 05/25/2020   HDL 55 05/25/2020   CHOLHDL 3.4 05/25/2020   VLDL 22 05/25/2020   LDLCALC 110 (H) 05/25/2020   Lab Results  Component Value Date   TSH 1.836 05/25/2020    Therapeutic Level Labs: No results found for: LITHIUM No results found for: VALPROATE No components found for:  CBMZ  Current Medications: Current Outpatient Medications  Medication Sig Dispense Refill   hydrOXYzine (ATARAX/VISTARIL) 25 MG tablet Take 1 tablet (25 mg total) by mouth every 6 (six) hours as needed for anxiety. 75 tablet 0   omeprazole (PRILOSEC) 40 MG capsule Take 1 capsule (40 mg total) by mouth daily before breakfast. 30 capsule 2   Prenatal Vit-Fe Fumarate-FA (PRENATAL MULTIVITAMIN) TABS tablet Take 1 tablet by mouth daily.  sertraline (ZOLOFT) 100 MG tablet Take 1 tablet (100 mg total) by mouth daily. 30 tablet 2   traZODone (DESYREL) 50 MG tablet Take 1 tablet (50 mg total) by mouth at bedtime. For sleep 30 tablet 0   No current facility-administered medications for this visit.     Musculoskeletal: Strength & Muscle Tone: Unable to assess due to telemedicine visit Gait & Station: Unable to assess due to telemedicine visit Patient leans: Unable to assess due to telemedicine visit  Psychiatric Specialty Exam: Review of Systems  Psychiatric/Behavioral:  Positive for sleep disturbance. Negative for decreased concentration, dysphoric mood, hallucinations, self-injury and suicidal ideas. The patient is nervous/anxious. The patient is not hyperactive.    not currently breastfeeding.There is no height or weight on file to calculate BMI.  General Appearance: Unable to assess due to telemedicine visit  Eye Contact:  Unable to assess due to telemedicine visit  Speech:  Clear and Coherent and Normal Rate   Volume:  Normal  Mood:  Anxious and Euthymic  Affect:  Appropriate and Congruent  Thought Process:  Coherent, Goal Directed, and Descriptions of Associations: Intact  Orientation:  Full (Time, Place, and Person)  Thought Content: WDL   Suicidal Thoughts:  No  Homicidal Thoughts:  No  Memory:  Immediate;   Good Recent;   Good Remote;   Good  Judgement:  Good  Insight:  Good  Psychomotor Activity:  Normal  Concentration:  Concentration: Good and Attention Span: Good  Recall:  Good  Fund of Knowledge: Fair  Language: Good  Akathisia:  NA  Handed:  Right  AIMS (if indicated): not done  Assets:  Communication Skills Desire for Improvement Financial Resources/Insurance Housing Intimacy Social Support Vocational/Educational  ADL's:  Intact  Cognition: WNL  Sleep:  Fair   Screenings: AIMS    Flowsheet Row [facility]  AIMS Total Score 0      AUDIT    Flowsheet Row Ad[facility]  Alcohol Use Disorder Identification Test Final Score (AUDIT) 0      GAD-7    Flowsheet Row Video Visit from 02/15/2021 in [facility] Video Visit from 12/15/2020 in [facility] Video Visit from 10/13/2020 in [facility] Office Visit from 08/30/2020 in [facility] Clinical Support from 07/15/2020 in [facility]  Total GAD-7 Score 10 6 8 2 4       PHQ2-9    Flowsheet Row Video Visit from 02/15/2021 in [facility] Video Visit from 12/15/2020 in [facility] Video Visit from 10/13/2020 in [facility] Office Visit from 08/30/2020 in [facility] Clinical Support from 07/15/2020 in [facility]  PHQ-2 Total Score 2 2 3  0 0  PHQ-9 Total Score 11 11 14  -- --      Flowsheet Row Video Visit from 02/15/2021 in [facility] Video Visit from 12/15/2020 in [facility] Video Visit from 10/13/2020 in [facility]  C-SSRS RISK CATEGORY No Risk No Risk Low Risk        Assessment and Plan:   Lisa Herrera is a 25 year old female with a past psychiatric history significant for PTSD, major depressive disorder, and generalized anxiety disorder who presents to [facility]  via virtual telephone visit for follow-up and medication management.  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  Provider recommended increasing her dosage of sertraline from 50 mg to 100 mg daily for the management of her anxiety.  Patient was agreeable to recommendation.  Patient's medications to be e-prescribed to pharmacy of choice.  1. PTSD (post-traumatic stress disorder)  - sertraline (ZOLOFT) 100 MG tablet; Take 1 tablet (100 mg total) by mouth daily.  Dispense: 30 tablet; Refill: 2  2. Moderate episode of recurrent major depressive disorder (HCC)  - sertraline (ZOLOFT) 100 MG tablet; Take 1 tablet (100 mg total) by mouth daily.  Dispense: 30 tablet; Refill: 2  3. Generalized anxiety disorder  - sertraline (ZOLOFT) 100 MG tablet; Take 1 tablet (100 mg total) by mouth daily.  Dispense: 30 tablet; Refill: 2  Patient tot follow up in 2 months Provider spent a total of 22 minutes with the patient/reviewing patient's chart  Meta Hatchet, PA 02/15/2021, 5:20 PM

## 2021-04-20 ENCOUNTER — Telehealth (HOSPITAL_COMMUNITY): Payer: Medicaid Other | Admitting: Physician Assistant

## 2021-05-03 ENCOUNTER — Telehealth (INDEPENDENT_AMBULATORY_CARE_PROVIDER_SITE_OTHER): Payer: Medicaid Other | Admitting: Physician Assistant

## 2021-05-03 ENCOUNTER — Encounter (HOSPITAL_COMMUNITY): Payer: Self-pay | Admitting: Physician Assistant

## 2021-05-03 ENCOUNTER — Other Ambulatory Visit: Payer: Self-pay

## 2021-05-03 DIAGNOSIS — F411 Generalized anxiety disorder: Secondary | ICD-10-CM

## 2021-05-03 DIAGNOSIS — F4310 Post-traumatic stress disorder, unspecified: Secondary | ICD-10-CM | POA: Diagnosis not present

## 2021-05-03 DIAGNOSIS — F330 Major depressive disorder, recurrent, mild: Secondary | ICD-10-CM

## 2021-05-03 MED ORDER — SERTRALINE HCL 100 MG PO TABS: 100.0000 mg | ORAL_TABLET | Freq: Every day | ORAL | 2 refills | Status: DC

## 2021-05-03 NOTE — Progress Notes (Signed)
BH MD/PA/NP OP Progress Note  Virtual Visit via Telephone Note  I connected with Lisa Herrera on 05/03/21 at  8:30 AM EST by telephone and verified that I am speaking with the correct person using two identifiers.  Location: Patient: Home Provider: Clinic   I discussed the limitations, risks, security and privacy concerns of performing an evaluation and management service by telephone and the availability of in person appointments. I also discussed with the patient that there may be a patient responsible charge related to this service. The patient expressed understanding and agreed to proceed.  Follow Up Instructions:  I discussed the assessment and treatment plan with the patient. The patient was provided an opportunity to ask questions and all were answered. The patient agreed with the plan and demonstrated an understanding of the instructions.   The patient was advised to call back or seek an in-person evaluation if the symptoms worsen or if the condition fails to improve as anticipated.  I provided 22 minutes of non-face-to-face time during this encounter.  Meta Hatchet, PA   05/03/2021 9:58 AM [name]  MRN:  [id number]  Chief Complaint: Follow up and medication management  HPI:   Lisa Herrera is a 26 year old female with a past psychiatric history significant for PTSD, major depressive disorder, and generalized anxiety disorder who presents to [facility] behavioral health outpatient clinic for follow-up and medication management.  Spanish interpretive services were utilized during the duration of the encounter via telephone.  Patient is currently being managed on the following medication: Sertraline 100 mg daily.  Patient reports that her use of sertraline 100 mg daily has been going well.  Patient denies experiencing any adverse side effects from her recent medication adjustment.  Patient states that her depression is well managed and denies experiencing any anxiety.   Patient denies any new stressors at this time.  Patient reports no other issues regarding her mental health.  A GAD-7 screen was performed with the patient scoring a 7.  Patient is alert and oriented x4, calm, cooperative, and fully engaged in conversation during the encounter.  Patient states that she is relaxed now but knows she will be irritable later in the day when she has to do her day-to-day activities.  Patient denies suicidal or homicidal ideations.  She further denies auditory or visual hallucinations and does not appear to be responding to internal/external stimuli.  Patient endorses fair sleep and receives on average 5 hours of sleep each night.  She reports going to bed at 2 PM and waking up at 7 AM to get her child ready for school.  Patient endorses good appetite and eats on average 3 meals per day.  Patient denies alcohol consumption, tobacco use, and illicit drug use.  Patient does express that her primary care provider has noted some weight gain in the patient.  She states that her provider warned her that her weight gain could be due to her medication.  Patient's primary care provider has discussed bariatric surgery with the patient and informed patient that she would need a psychiatric evaluation before undergoing surgery.  Patient inquired on whether she should undergo the surgery or not.  Provider informed patient that her primary care provider would be contacted to discuss options for her weight loss.  Visit Diagnosis:    ICD-10-CM   1. PTSD (post-traumatic stress disorder)  F43.10 sertraline (ZOLOFT) 100 MG tablet    2. Generalized anxiety disorder  F41.1 sertraline (ZOLOFT) 100 MG tablet  3. Mild episode of recurrent major depressive disorder (HCC)  F33.0     4. Moderate episode of recurrent major depressive disorder (HCC)  F33.1 sertraline (ZOLOFT) 100 MG tablet      Past Psychiatric History:  Major depressive disorder Generalized anxiety disorder PTSD  Past Medical  History:  Past Medical History:  Diagnosis Date   Alpha thalassemia silent carrier 05/29/2019   Gallstones    Hepatic steatosis 02/04/2020   Migraine    Umbilical hernia without obstruction and without gangrene 02/04/2020    Past Surgical History:  Procedure Laterality Date   CESAREAN SECTION N/A 01/14/2016   Procedure: CESAREAN SECTION;  Surgeon: Tereso Newcomer, MD;  [facility];  Service: Obstetrics;  Laterality: N/A;   CESAREAN SECTION N/A 11/17/2019   Procedure: CESAREAN SECTION;  Surgeon: Levie Heritage, DO;  Location: [facility];  Service: Obstetrics;  Laterality: N/A;   CHOLECYSTECTOMY     TUBAL LIGATION Bilateral 11/17/2019   Procedure: BILATERAL TUBAL LIGATION;  Surgeon: Levie Heritage, DO;  Location: [facility];  Service: Obstetrics;  Laterality: Bilateral;    Family Psychiatric History:  Grandfather - patient states that her grandfather may have been schizophrenic but he was never officially diagnosed   Brother - patient states that her brother has been assessed for schizophrenia but has never been diagnosed  Family History:  Family History  Problem Relation Age of Onset   Breast cancer Maternal Aunt        50's   Colon cancer Neg Hx    Stomach cancer Neg Hx    Esophageal cancer Neg Hx     Social History:  Social History   Socioeconomic History   Marital status: Married    Spouse name: Not on file   Number of children: 1   Years of education: Not on file   Highest education level: Not on file  Occupational History   Not on file  Tobacco Use   Smoking status: Never   Smokeless tobacco: Never  Vaping Use   Vaping Use: Never used  Substance and Sexual Activity   Alcohol use: No    Alcohol/week: 0.0 standard drinks   Drug use: No   Sexual activity: Yes    Birth control/protection: None  Other Topics Concern   Not on file  Social History Narrative   Married, originally from [place], Spanish-speaking.   Son born October 2021 and a  daughter born in 2017   No alcohol no tobacco or drug use   Social Determinants of Corporate investment banker Strain: Not on file  Food Insecurity: Not on file  Transportation Needs: Not on file  Physical Activity: Not on file  Stress: Not on file  Social Connections: Not on file    Allergies:  Allergies  Allergen Reactions   Bupropion     Anxiousness Tachycardia    Metabolic Disorder Labs: Lab Results  Component Value Date   HGBA1C 5.3 05/25/2020   MPG 105.41 05/25/2020   No results found for: PROLACTIN Lab Results  Component Value Date   CHOL 187 05/25/2020   TRIG 111 05/25/2020   HDL 55 05/25/2020   CHOLHDL 3.4 05/25/2020   VLDL 22 05/25/2020   LDLCALC 110 (H) 05/25/2020   Lab Results  Component Value Date   TSH 1.836 05/25/2020    Therapeutic Level Labs: No results found for: LITHIUM No results found for: VALPROATE No components found for:  CBMZ  Current Medications: Current Outpatient Medications  Medication Sig Dispense Refill   hydrOXYzine (ATARAX/VISTARIL) 25 MG tablet Take 1 tablet (25 mg total) by mouth every 6 (six) hours as needed for anxiety. 75 tablet 0   omeprazole (PRILOSEC) 40 MG capsule Take 1 capsule (40 mg total) by mouth daily before breakfast. 30 capsule 2   Prenatal Vit-Fe Fumarate-FA (PRENATAL MULTIVITAMIN) TABS tablet Take 1 tablet by mouth daily.      sertraline (ZOLOFT) 100 MG tablet Take 1 tablet (100 mg total) by mouth daily. 30 tablet 2   traZODone (DESYREL) 50 MG tablet Take 1 tablet (50 mg total) by mouth at bedtime. For sleep 30 tablet 0   No current facility-administered medications for this visit.     Musculoskeletal: Strength & Muscle Tone: Unable to assess due to telemedicine visit Gait & Station: Unable to assess due to telemedicine visit Patient leans: Unable to assess due to telemedicine visit  Psychiatric Specialty Exam: Review of Systems  Psychiatric/Behavioral:  Negative for decreased concentration,  dysphoric mood, hallucinations, self-injury, sleep disturbance and suicidal ideas. The patient is not nervous/anxious and is not hyperactive.    not currently breastfeeding.There is no height or weight on file to calculate BMI.  General Appearance: Unable to assess due to telemedicine visit  Eye Contact:  Unable to assess due to telemedicine visit  Speech:  Clear and Coherent and Normal Rate  Volume:  Normal  Mood:  Euthymic  Affect:  Appropriate  Thought Process:  Coherent and Descriptions of Associations: Intact  Orientation:  Full (Time, Place, and Person)  Thought Content: WDL   Suicidal Thoughts:  No  Homicidal Thoughts:  No  Memory:  Immediate;   Good Recent;   Good Remote;   Good  Judgement:  Good  Insight:  Good  Psychomotor Activity:  Normal  Concentration:  Concentration: Good and Attention Span: Good  Recall:  Good  Fund of Knowledge: Good  Language: Good  Akathisia:  NA  Handed:  Right  AIMS (if indicated): not done  Assets:  Communication Skills Desire for Improvement Housing Intimacy Social Support Vocational/Educational  ADL's:  Intact  Cognition: WNL  Sleep:  Good   Screenings: AIMS    Flowsheet Row [facility]  AIMS Total Score 0      AUDIT    Flowsheet Row Ad[facility] ([facility]  Alcohol Use Disorder Identification Test Final Score (AUDIT) 0      GAD-7    Flowsheet [facility]  Total GAD-7 Score 7 10 6 8 2       PHQ2-9    Flowsheet Row [facility]  PHQ-2 Total Score 0 2 2 3  0  PHQ-9 Total Score -- 11 11 14  --      Flowsheet Row [facility]  [facility]  C-SSRS RISK CATEGORY No Risk No Risk No Risk        Assessment and Plan:   Lisa Herrera is a 26 year old female with a past psychiatric history significant for PTSD, major depressive disorder, and generalized anxiety disorder who presents to [facility] for follow-up and medication management.  Spanish interpretive services were utilized during the duration of the encounter via telephone.  Patient reports no issues or concerns regarding her current medication regimen.  Patient denies the need for dosage adjustments at this time.  Provider to reach out to patient's primary care provider regarding patient's current medication regimen and to discuss possible options for patient's weight loss.  Patient's medication to be e-prescribed to pharmacy of choice.  1. PTSD (post-traumatic stress disorder)  - sertraline (ZOLOFT) 100 MG tablet; Take 1 tablet (100 mg total) by mouth daily.  Dispense: 30 tablet; Refill: 2  2. Generalized anxiety disorder  - sertraline (ZOLOFT) 100 MG tablet; Take 1 tablet (100 mg total) by mouth daily.  Dispense: 30 tablet; Refill: 2  3. Mild episode of recurrent major depressive disorder (HCC)  - sertraline (ZOLOFT) 100 MG tablet; Take 1 tablet (100 mg total) by mouth daily.  Dispense: 30 tablet; Refill: 2  Patient to  follow up in 2 months Provider spent a total of 22 minutes with the patient/reviewing patient's Duanne Moron, PA 05/03/2021, 9:58 AM

## 2021-05-14 ENCOUNTER — Other Ambulatory Visit (HOSPITAL_COMMUNITY): Payer: Self-pay | Admitting: Physician Assistant

## 2021-05-14 DIAGNOSIS — F330 Major depressive disorder, recurrent, mild: Secondary | ICD-10-CM

## 2021-05-14 DIAGNOSIS — F411 Generalized anxiety disorder: Secondary | ICD-10-CM

## 2021-05-14 DIAGNOSIS — F4310 Post-traumatic stress disorder, unspecified: Secondary | ICD-10-CM

## 2021-07-04 ENCOUNTER — Telehealth (INDEPENDENT_AMBULATORY_CARE_PROVIDER_SITE_OTHER): Payer: Medicaid Other | Admitting: Physician Assistant

## 2021-07-04 ENCOUNTER — Encounter (HOSPITAL_COMMUNITY): Payer: Self-pay | Admitting: Physician Assistant

## 2021-07-04 DIAGNOSIS — F330 Major depressive disorder, recurrent, mild: Secondary | ICD-10-CM | POA: Diagnosis not present

## 2021-07-04 DIAGNOSIS — F4310 Post-traumatic stress disorder, unspecified: Secondary | ICD-10-CM

## 2021-07-04 DIAGNOSIS — F411 Generalized anxiety disorder: Secondary | ICD-10-CM | POA: Diagnosis not present

## 2021-07-04 MED ORDER — SERTRALINE HCL 100 MG PO TABS: 100.0000 mg | ORAL_TABLET | Freq: Every day | ORAL | 2 refills | Status: DC

## 2021-07-04 NOTE — Progress Notes (Signed)
BH MD/PA/NP OP Progress Note ? ?Virtual Visit via Telephone Note ? ?I connected with Lisa Herrera on 07/04/21 at 11:30 AM EDT by telephone and verified that I am speaking with the correct person using two identifiers. ? ?Location: ?Patient: Home ?Provider: Clinic ?  ?I discussed the limitations, risks, security and privacy concerns of performing an evaluation and management service by telephone and the availability of in person appointments. I also discussed with the patient that there may be a patient responsible charge related to this service. The patient expressed understanding and agreed to proceed. ? ?Follow Up Instructions: ? ?I discussed the assessment and treatment plan with the patient. The patient was provided an opportunity to ask questions and all were answered. The patient agreed with the plan and demonstrated an understanding of the instructions. ?  ?The patient was advised to call back or seek an in-person evaluation if the symptoms worsen or if the condition fails to improve as anticipated. ? ?I provided 29 minutes of non-face-to-face time during this encounter. ? ?Lisa Mood, PA ? ? ?07/04/2021 10:19 PM ?Lisa Herrera  ?MRN:  [id number] ? ?Chief Complaint:  ?Chief Complaint  ?Patient presents with  ? Follow-up  ? Medication Refill  ? ?HPI:  ? ?Lisa Herrera is a 26 year old female with a past psychiatric history significant for PTSD, major depressive disorder, and generalized anxiety disorder who presents to [facility] via virtual telephone visit for follow-up and medication management.  Spanish interpretive services were utilized during the duration of the encounter via telephone.  Patient is currently being managed on the following medication: Sertraline 100 mg daily. ? ?Patient reports that she is doing well.  Patient states that she has been taking her medication as prescribed and reports no issues or concerns regarding her medication.  Patient denies  experiencing depressive episodes.  She endorses some anxiety and rates her anxiety at 5 out of 10.  Patient's current stressors include the child's behavioral issues and her husband going over to [place] for an operation.  A GAD-7 screen was performed with the patient scoring a 7. ? ?Patient is alert and oriented x4, calm, cooperative, and fully engaged in conversation during the encounter.  Patient endorses feeling a little tired but states that her brain keeps telling her that she has to move and work.  Patient denies suicidal or homicidal ideations.  She further denies auditory or visual hallucinations and does not appear to be responding to internal/external stimuli.  Patient endorses fair sleep and receives on average 6 hours of sleep each night.  Patient endorses increased appetite and eats on average 3 meals per day.  Patient denies alcohol consumption, tobacco use, and illicit drug use. ? ?Visit Diagnosis:  ?  ICD-10-CM   ?1. PTSD (post-traumatic stress disorder)  F43.10 sertraline (ZOLOFT) 100 MG tablet  ?  ?2. Generalized anxiety disorder  F41.1 sertraline (ZOLOFT) 100 MG tablet  ?  ?3. Mild episode of recurrent major depressive disorder (HCC)  F33.0 sertraline (ZOLOFT) 100 MG tablet  ?  ? ? ?Past Psychiatric History:  ?Major depressive disorder ?Generalized anxiety disorder ?PTSD ? ?Past Medical History:  ?Past Medical History:  ?Diagnosis Date  ? Alpha thalassemia silent carrier 05/29/2019  ? Gallstones   ? Hepatic steatosis 02/04/2020  ? Migraine   ? Umbilical hernia without obstruction and without gangrene 02/04/2020  ?  ?Past Surgical History:  ?Procedure Laterality Date  ? CESAREAN SECTION N/A 01/14/2016  ? Procedure: CESAREAN SECTION;  Surgeon: Laray Anger  Paschal Dopp, MD;  Location: [place];  Service: Obstetrics;  Laterality: N/A;  ? CESAREAN SECTION N/A 11/17/2019  ? Procedure: CESAREAN SECTION;  Surgeon: Truett Mainland, DO;  Location: [facility];  Service: Obstetrics;  Laterality: N/A;  ?  CHOLECYSTECTOMY    ? TUBAL LIGATION Bilateral 11/17/2019  ? Procedure: BILATERAL TUBAL LIGATION;  Surgeon: Truett Mainland, DO;  Location: [facility];  Service: Obstetrics;  Laterality: Bilateral;  ? ? ?Family Psychiatric History:  ?Jon Gills - patient states that her grandfather may have been schizophrenic but he was never officially diagnosed ?  ?Brother - patient states that her brother has been assessed for schizophrenia but has never been diagnosed ? ?Family History:  ?Family History  ?Problem Relation Age of Onset  ? Breast cancer Maternal Aunt   ?     67's  ? Colon cancer Neg Hx   ? Stomach cancer Neg Hx   ? Esophageal cancer Neg Hx   ? ? ?Social History:  ?Social History  ? ?Socioeconomic History  ? Marital status: Married  ?  Spouse name: Not on file  ? Number of children: 1  ? Years of education: Not on file  ? Highest education level: Not on file  ?Occupational History  ? Not on file  ?Tobacco Use  ? Smoking status: Never  ? Smokeless tobacco: Never  ?Vaping Use  ? Vaping Use: Never used  ?Substance and Sexual Activity  ? Alcohol use: No  ?  Alcohol/week: 0.0 standard drinks  ? Drug use: No  ? Sexual activity: Yes  ?  Birth control/protection: None  ?Other Topics Concern  ? Not on file  ?Social History Narrative  ? Married, originally from [place], [place].  ? Son born October 2021 and a daughter born in 2017  ? No alcohol no tobacco or drug use  ? ?Social Determinants of Health  ? ?Financial Resource Strain: Not on file  ?Food Insecurity: Not on file  ?Transportation Needs: Not on file  ?Physical Activity: Not on file  ?Stress: Not on file  ?Social Connections: Not on file  ? ? ?Allergies:  ?Allergies  ?Allergen Reactions  ? Bupropion   ?  Anxiousness ?Tachycardia  ? ? ?Metabolic Disorder Labs: ?Lab Results  ?Component Value Date  ? HGBA1C 5.3 05/25/2020  ? MPG 105.41 05/25/2020  ? ?No results found for: PROLACTIN ?Lab Results  ?Component Value Date  ? CHOL 187 05/25/2020  ? TRIG 111 05/25/2020   ? HDL 55 05/25/2020  ? CHOLHDL 3.4 05/25/2020  ? VLDL 22 05/25/2020  ? LDLCALC 110 (H) 05/25/2020  ? ?Lab Results  ?Component Value Date  ? TSH 1.836 05/25/2020  ? ? ?Therapeutic Level Labs: ?No results found for: LITHIUM ?No results found for: VALPROATE ?No components found for:  CBMZ ? ?Current Medications: ?Current Outpatient Medications  ?Medication Sig Dispense Refill  ? hydrOXYzine (ATARAX/VISTARIL) 25 MG tablet Take 1 tablet (25 mg total) by mouth every 6 (six) hours as needed for anxiety. 75 tablet 0  ? omeprazole (PRILOSEC) 40 MG capsule Take 1 capsule (40 mg total) by mouth daily before breakfast. 30 capsule 2  ? Prenatal Vit-Fe Fumarate-FA (PRENATAL MULTIVITAMIN) TABS tablet Take 1 tablet by mouth daily.     ? sertraline (ZOLOFT) 100 MG tablet Take 1 tablet (100 mg total) by mouth daily. 30 tablet 2  ? traZODone (DESYREL) 50 MG tablet Take 1 tablet (50 mg total) by mouth at bedtime. For sleep 30 tablet 0  ? ?  No current facility-administered medications for this visit.  ? ? ? ?Musculoskeletal: ?Strength & Muscle Tone: Unable to assess due to telemedicine visit ?Gait & Station: Unable to assess due to telemedicine visit ?Patient leans: Unable to assess due to telemedicine visit ? ?Psychiatric Specialty Exam: ?Review of Systems  ?Psychiatric/Behavioral:  Negative for decreased concentration, dysphoric Herrera, hallucinations, self-injury, sleep disturbance and suicidal ideas. The patient is nervous/anxious. The patient is not hyperactive.    ?not currently breastfeeding.There is no height or weight on file to calculate BMI.  ?General Appearance: Unable to assess due to telemedicine visit  ?Eye Contact:  Unable to assess due to telemedicine visit  ?Speech:  Clear and Coherent and Normal Rate  ?Volume:  Normal  ?Herrera:  Anxious and Euthymic  ?Affect:  Appropriate and Congruent  ?Thought Process:  Coherent and Descriptions of Associations: Intact  ?Orientation:  Full (Time, Place, and Person)  ?Thought Content:  WDL   ?Suicidal Thoughts:  No  ?Homicidal Thoughts:  No  ?Memory:  Immediate;   Good ?Recent;   Good ?Remote;   Good  ?Judgement:  Good  ?Insight:  Good  ?Psychomotor Activity:  Normal  ?Concentration:  Concent

## 2021-07-16 IMAGING — US US MFM OB FOLLOW-UP
1 series · 13 of 28 positions shown · non-contrast
Comparison: none

[Series 1: us mfm ob follow-up · 13 of 28 slices shown]
[im 2/28]
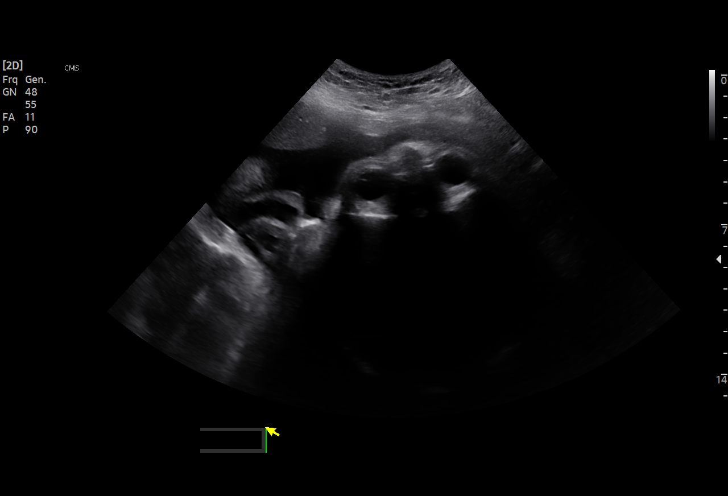
[im 4/28]
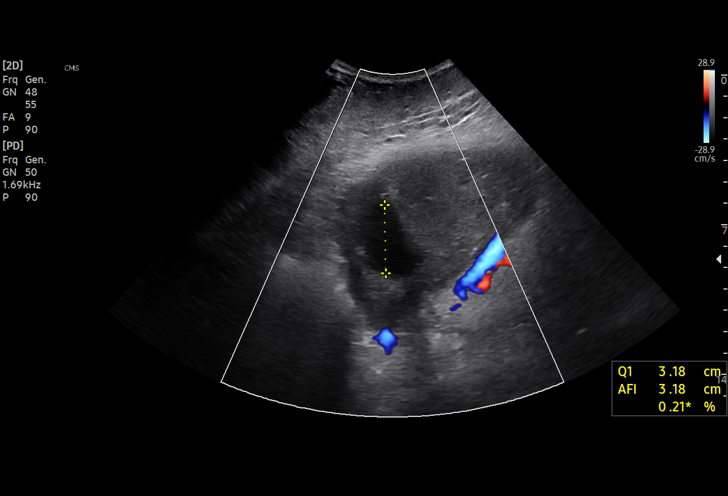
[im 6/28]
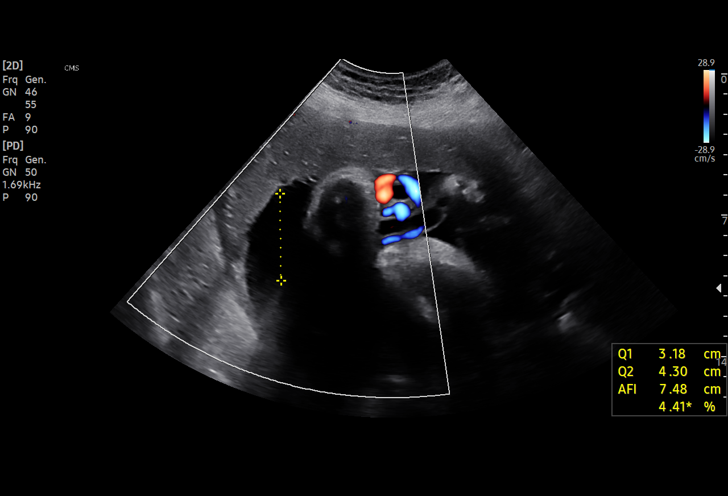
[im 8/28]
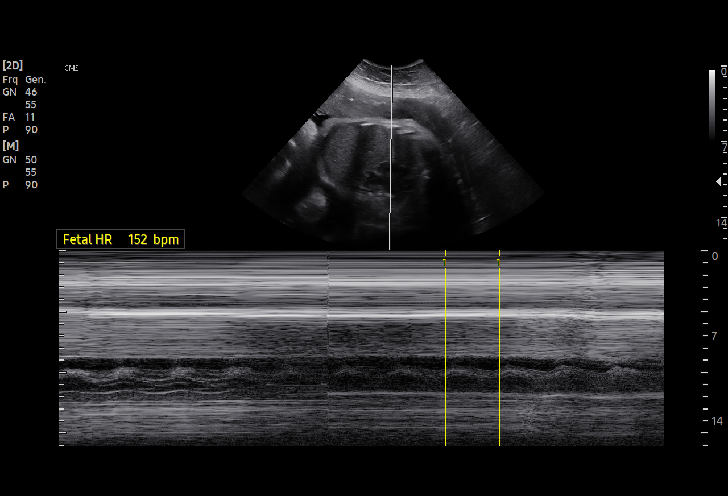
[im 10/28]
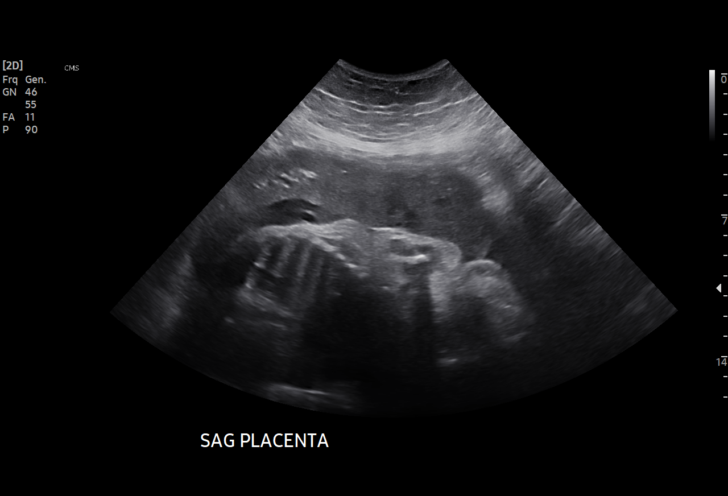
[im 12/28]
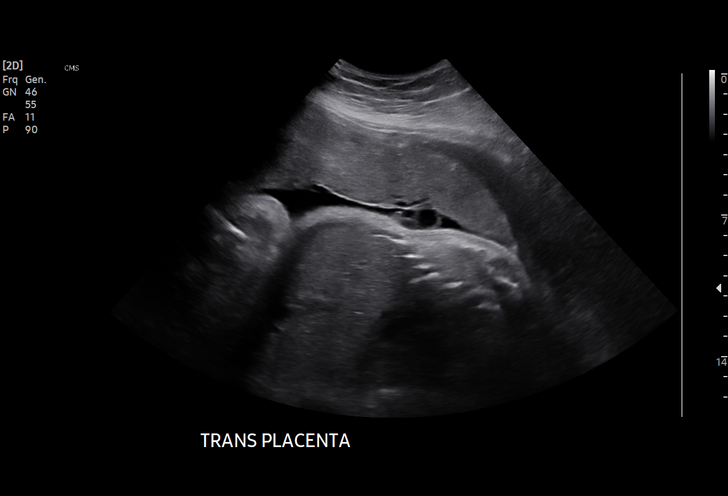
[im 15/28]
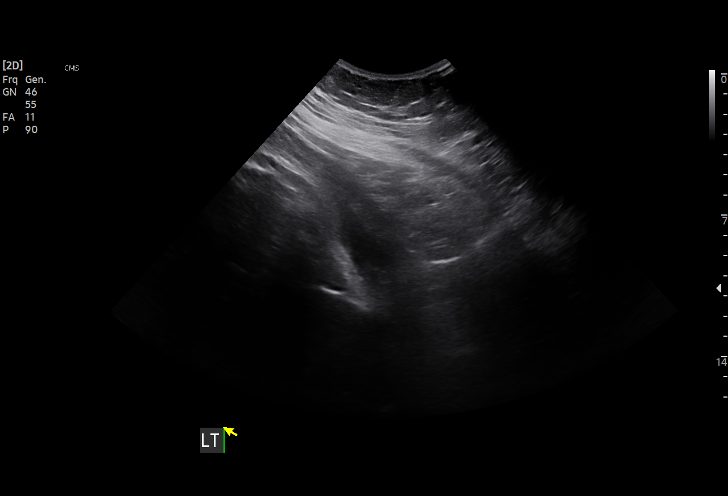
[im 17/28]
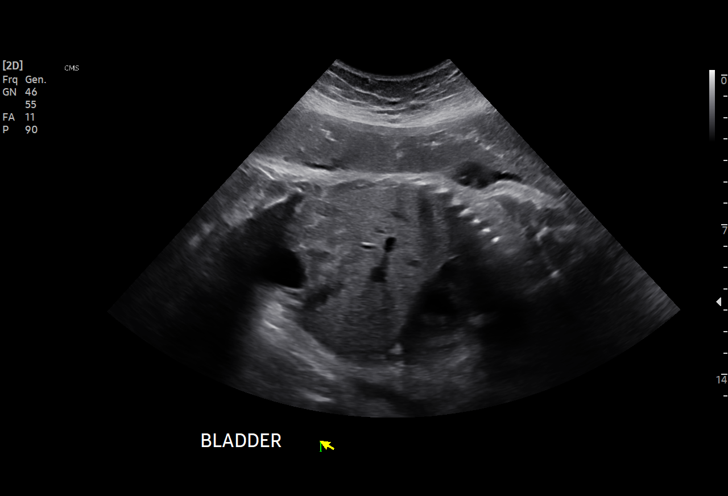
[im 19/28]
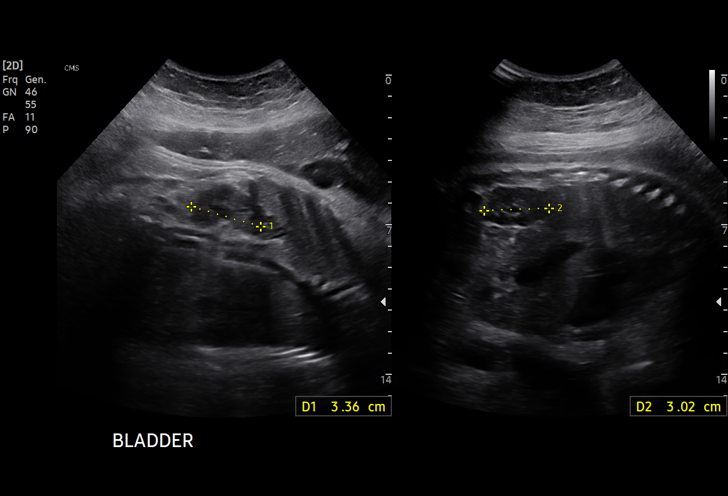
[im 21/28]
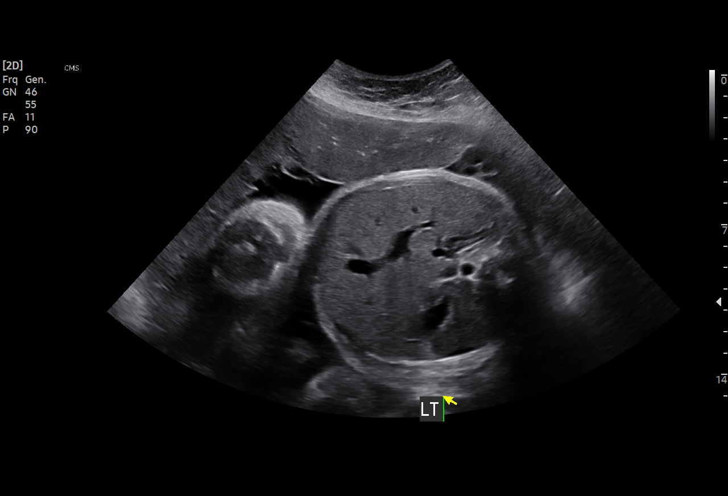
[im 23/28]
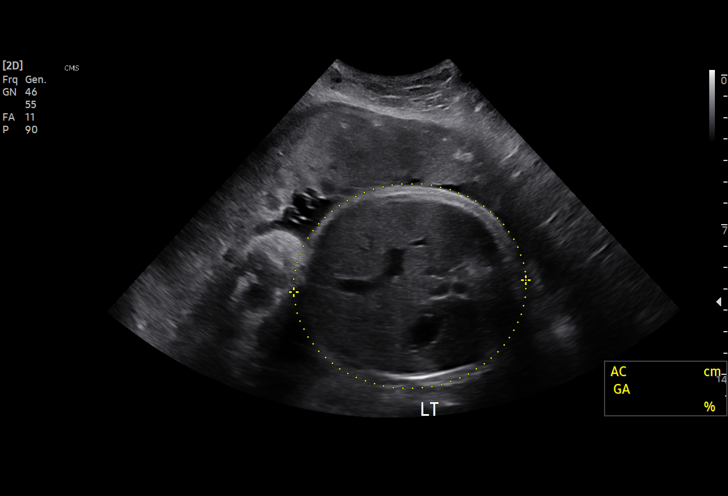
[im 25/28]
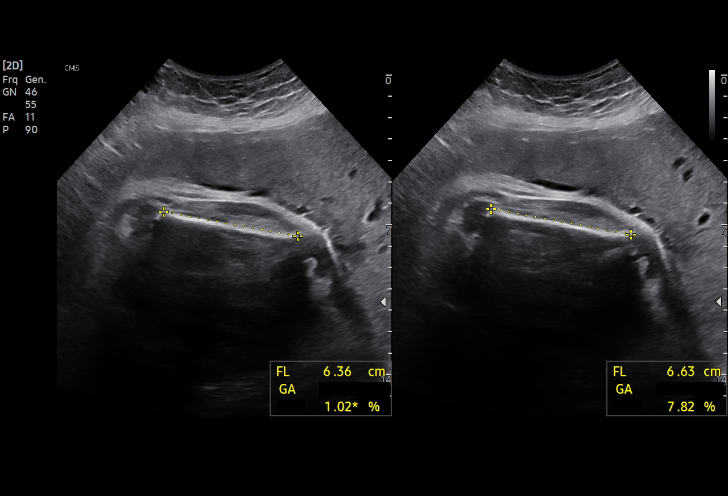
[im 27/28]
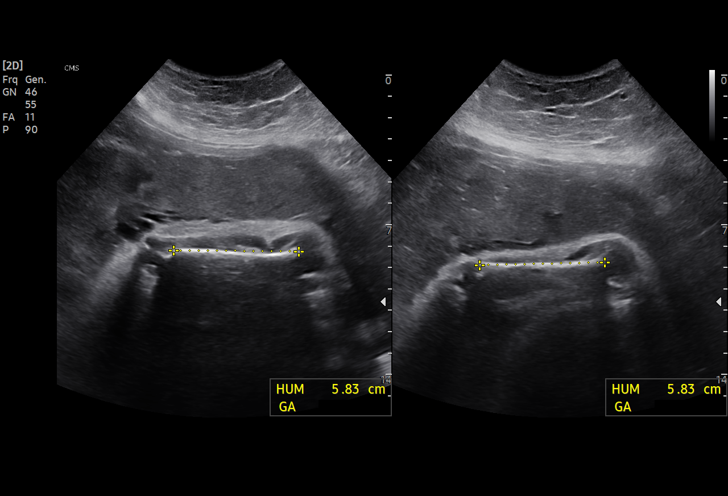

[13 of 28 positions shown; findings below may reference images not displayed]

REPECKA

Indications

 Encounter for other antenatal screening
 follow-up
 Obesity complicating pregnancy, third
 trimester (pregravid BMI 45)
 Abnormal biochemical screen (elevated AFP)
 Previous cesarean delivery, antepartum
 Genetic carrier (Mizuhiro Martinez Nunez)
 36 weeks gestation of pregnancy
Fetal Evaluation

 Num Of Fetuses:         1
 Fetal Heart Rate(bpm):  152
 Cardiac Activity:       Observed
 Presentation:           Cephalic
 Placenta:               Anterior
 P. Cord Insertion:      Visualized

 Amniotic Fluid
 AFI FV:      Within normal limits

 AFI Sum(cm)     %Tile       Largest Pocket(cm)
 10.6            26

 RUQ(cm)       RLQ(cm)       LUQ(cm)        LLQ(cm)

Biometry

 BPD:      92.6  mm     G. Age:  37w 4d         92  %    CI:        76.55   %    70 - 86
                                                         FL/HC:      19.4   %    20.1 -
 HC:      335.3  mm     G. Age:  38w 3d         77  %    HC/AC:      1.04        0.93 -
 AC:      323.2  mm     G. Age:  36w 2d         67  %    FL/BPD:     70.4   %    71 - 87
 FL:       65.2  mm     G. Age:  33w 4d        3.7  %    FL/AC:      20.2   %    20 - 24
 HUM:      58.3  mm     G. Age:  33w 5d         26  %

 Est. FW:    8959  gm      6 lb 3 oz     49  %
OB History

 Blood Type:   O+
 Gravidity:    2         Term:   1        Prem:   0        SAB:   0
 TOP:          0       Ectopic:  0        Living: 1
Gestational Age

 LMP:           36w 0d        Date:  02/17/19                 EDD:   11/24/19
 U/S Today:     36w 3d                                        EDD:   11/21/19
 Best:          36w 0d     Det. By:  LMP  (02/17/19)          EDD:   11/24/19
Anatomy

 Cranium:               Previously seen        LVOT:                   Appears normal
 Cavum:                 Previously seen        Aortic Arch:            Previously seen
 Ventricles:            Previously seen        Ductal Arch:            Previously seen
 Choroid Plexus:        Previously seen        Diaphragm:              Previously seen
 Cerebellum:            Previously seen        Stomach:                Appears normal, left
                                                                       sided
 Posterior Fossa:       Previously seen        Abdomen:                Previously seen
 Nuchal Fold:           Previously seen        Abdominal Wall:         Previously seen
 Face:                  Orbits and profile     Cord Vessels:           Previously seen
                        previously seen
 Lips:                  Previously seen        Kidneys:                Appear normal
 Palate:                Previously seen        Bladder:                Appears normal
 Thoracic:              Appears normal         Spine:                  Previously seen
 Heart:                 Appears normal         Upper Extremities:      Previously seen
                        (4CH, axis, and
                        situs)
 RVOT:                  Appears normal         Lower Extremities:      Previously seen

 Other:  Technically difficult due to maternal habitus and fetal position. Male
         gender previously seen.
Cervix Uterus Adnexa

 Cervix
 Not visualized (advanced GA >86wks)

 Uterus
 No abnormality visualized.

 Right Ovary
 Not visualized.
 Left Ovary
 Not visualized.

 Cul De Sac
 No free fluid seen.

 Adnexa
 No abnormality visualized.
Impression

 Maternal obesity. Increased MSAFP on serum screening in
 the second trimester.

 Amniotic fluid is normal and good fetal activity is seen .Fetal
 growth is appropriate for gestational age .Cephalic
 presentation.

 Patient complained of diffuse abdominal discomfort.  On
 palpation, no tenderness was elicited.  No suprapubic
 tenderness.

 She has a follow-up prenatal visit appointment tomorrow.
Recommendations

 Follow-up scans as clinically indicated.
                 Alomar, Ubeyit

## 2021-07-20 ENCOUNTER — Other Ambulatory Visit (HOSPITAL_COMMUNITY)
Admission: RE | Admit: 2021-07-20 | Discharge: 2021-07-20 | Disposition: A | Discharge status: Home / Self Care | Payer: Medicaid Other | Source: Ambulatory Visit | Attending: Nurse Practitioner | Admitting: Nurse Practitioner

## 2021-07-20 ENCOUNTER — Other Ambulatory Visit: Payer: Self-pay | Admitting: Nurse Practitioner

## 2021-07-20 DIAGNOSIS — Z124 Encounter for screening for malignant neoplasm of cervix: Secondary | ICD-10-CM | POA: Diagnosis not present

## 2021-07-25 LAB — CYTOLOGY - PAP: Diagnosis: NEGATIVE

## 2021-08-16 ENCOUNTER — Other Ambulatory Visit (HOSPITAL_COMMUNITY): Payer: Self-pay | Admitting: Physician Assistant

## 2021-08-16 DIAGNOSIS — F330 Major depressive disorder, recurrent, mild: Secondary | ICD-10-CM

## 2021-08-16 DIAGNOSIS — F4310 Post-traumatic stress disorder, unspecified: Secondary | ICD-10-CM

## 2021-08-16 DIAGNOSIS — F411 Generalized anxiety disorder: Secondary | ICD-10-CM

## 2021-09-01 ENCOUNTER — Other Ambulatory Visit (HOSPITAL_COMMUNITY): Payer: Self-pay | Admitting: Physician Assistant

## 2021-09-01 ENCOUNTER — Telehealth (INDEPENDENT_AMBULATORY_CARE_PROVIDER_SITE_OTHER): Payer: Medicaid Other | Admitting: Physician Assistant

## 2021-09-01 DIAGNOSIS — F411 Generalized anxiety disorder: Secondary | ICD-10-CM

## 2021-09-01 DIAGNOSIS — F330 Major depressive disorder, recurrent, mild: Secondary | ICD-10-CM | POA: Diagnosis not present

## 2021-09-01 DIAGNOSIS — F4310 Post-traumatic stress disorder, unspecified: Secondary | ICD-10-CM

## 2021-09-01 MED ORDER — SERTRALINE HCL 50 MG PO TABS: 150.0000 mg | ORAL_TABLET | Freq: Every day | ORAL | 2 refills | Status: DC

## 2021-09-01 MED ORDER — BUSPIRONE HCL 7.5 MG PO TABS: 7.5000 mg | ORAL_TABLET | Freq: Two times a day (BID) | ORAL | 2 refills | Status: DC

## 2021-09-04 ENCOUNTER — Encounter (HOSPITAL_COMMUNITY): Payer: Self-pay | Admitting: Physician Assistant

## 2021-09-04 NOTE — Progress Notes (Addendum)
[place] MD/PA/NP OP Progress Note  Virtual Visit via Telephone Note  I connected with Lisa Herrera on 09/04/21 at  1:00 PM EDT by telephone and verified that I am speaking with the correct person using two identifiers.  Location: Patient: Home Provider: Clinic   I discussed the limitations, risks, security and privacy concerns of performing an evaluation and management service by telephone and the availability of in person appointments. I also discussed with the patient that there may be a patient responsible charge related to this service. The patient expressed understanding and agreed to proceed.  Follow Up Instructions:  I discussed the assessment and treatment plan with the patient. The patient was provided an opportunity to ask questions and all were answered. The patient agreed with the plan and demonstrated an understanding of the instructions.   The patient was advised to call back or seek an in-person evaluation if the symptoms worsen or if the condition fails to improve as anticipated.  I provided 27 minutes of non-face-to-face time during this encounter.  Malachy Mood, PA   09/04/2021 11:43 PM Kyndra Eick  MRN:  [id number]  Chief Complaint:  No chief complaint on file.  HPI:   Lisa Herrera is a 26 year old female with a past psychiatric history significant for PTSD, major depressive disorder, and generalized anxiety disorder who presents to [facility] via virtual telephone visit for follow-up and medication management.  Spanish interpretive services were utilized during the duration of the encounter via telephone.  Patient is currently being managed on the following medication: Sertraline 100 mg daily.  Patient reports that she is doing well but does express experiencing anxiety.  She reports that she has been going through anxiety for 1 week.  She attributes her anxiety to going to [place] with her family.  She reports that her  current prescription of sertraline 100 mg daily has not been helpful in the management of her anxiety.  She rates her anxiety an 8 out of 10 and states that her anxiety is alleviated through selling or doing courses with her son.  Patient also attributes her anxiety to the therapy her children is going through at this time.  She also notes that she has been having a headache for 2 days but states that Tylenol has been effective in managing her symptoms.  A PHQ-9 screen was performed with the patient scoring a 15.  A GAD-7 screen was also performed with the patient scoring a 13.  Patient is alert and oriented x4, calm, cooperative, and fully engaged in conversation during the encounter.  Patient endorses feeling nervous, scared, and having no strengths.  Patient denies suicidal or homicidal ideations.  She further denies auditory or visual hallucinations and does not appear to be responding to internal/external stimuli.  Patient endorses good sleep and receives on average 8 hours of sleep each night.  She occasionally endorses oversleeping.  Patient endorses good appetite and eats on average 3 meals per day.  Patient denies alcohol consumption, tobacco use, and illicit drug use.  Visit Diagnosis:    ICD-10-CM   1. PTSD (post-traumatic stress disorder)  F43.10 DISCONTINUED: sertraline (ZOLOFT) 50 MG tablet    2. Generalized anxiety disorder  F41.1 busPIRone (BUSPAR) 7.5 MG tablet    DISCONTINUED: sertraline (ZOLOFT) 50 MG tablet    3. Mild episode of recurrent major depressive disorder (HCC)  F33.0 DISCONTINUED: sertraline (ZOLOFT) 50 MG tablet      Past Psychiatric History:  Major depressive disorder Generalized  anxiety disorder PTSD  Past Medical History:  Past Medical History:  Diagnosis Date   Alpha thalassemia silent carrier 05/29/2019   Gallstones    Hepatic steatosis 02/04/2020   Migraine    Umbilical hernia without obstruction and without gangrene 02/04/2020    Past Surgical History:   Procedure Laterality Date   CESAREAN SECTION N/A 01/14/2016   Procedure: CESAREAN SECTION;  Surgeon: Osborne Oman, MD;  Location: [place];  Service: Obstetrics;  Laterality: N/A;   CESAREAN SECTION N/A 11/17/2019   Procedure: CESAREAN SECTION;  Surgeon: Truett Mainland, DO;  Location: [place] [facility];  Service: Obstetrics;  Laterality: N/A;   CHOLECYSTECTOMY     TUBAL LIGATION Bilateral 11/17/2019   Procedure: BILATERAL TUBAL LIGATION;  Surgeon: Truett Mainland, DO;  Location: [place] [facility];  Service: Obstetrics;  Laterality: Bilateral;    Family Psychiatric History:  Grandfather - patient states that her grandfather may have been schizophrenic but he was never officially diagnosed   Brother - patient states that her brother has been assessed for schizophrenia but has never been diagnosed  Family History:  Family History  Problem Relation Age of Onset   Breast cancer Maternal Aunt        50's   Colon cancer Neg Hx    Stomach cancer Neg Hx    Esophageal cancer Neg Hx     Social History:  Social History   Socioeconomic History   Marital status: Married    Spouse name: Not on file   Number of children: 1   Years of education: Not on file   Highest education level: Not on file  Occupational History   Not on file  Tobacco Use   Smoking status: Never   Smokeless tobacco: Never  Vaping Use   Vaping Use: Never used  Substance and Sexual Activity   Alcohol use: No    Alcohol/week: 0.0 standard drinks of alcohol   Drug use: No   Sexual activity: Yes    Birth control/protection: None  Other Topics Concern   Not on file  Social History Narrative   Married, originally from [place], Lisa Herrera.   Son born October 2021 and a daughter born in 2017   No alcohol no tobacco or drug use   Social Determinants of Radio broadcast assistant Strain: Not on file  Food Insecurity: Not on file  Transportation Needs: Not on file  Physical Activity: Not on file  Stress:  Not on file  Social Connections: Not on file    Allergies:  Allergies  Allergen Reactions   Bupropion     Anxiousness Tachycardia    Metabolic Disorder Labs: Lab Results  Component Value Date   HGBA1C 5.3 05/25/2020   MPG 105.41 05/25/2020   No results found for: "PROLACTIN" Lab Results  Component Value Date   CHOL 187 05/25/2020   TRIG 111 05/25/2020   HDL 55 05/25/2020   CHOLHDL 3.4 05/25/2020   VLDL 22 05/25/2020   LDLCALC 110 (H) 05/25/2020   Lab Results  Component Value Date   TSH 1.836 05/25/2020    Therapeutic Level Labs: No results found for: "LITHIUM" No results found for: "VALPROATE" No results found for: "CBMZ"  Current Medications: Current Outpatient Medications  Medication Sig Dispense Refill   busPIRone (BUSPAR) 7.5 MG tablet Take 1 tablet (7.5 mg total) by mouth 2 (two) times daily. 60 tablet 2   hydrOXYzine (ATARAX/VISTARIL) 25 MG tablet Take 1 tablet (25 mg total) by mouth every 6 (  six) hours as needed for anxiety. 75 tablet 0   omeprazole (PRILOSEC) 40 MG capsule Take 1 capsule (40 mg total) by mouth daily before breakfast. 30 capsule 2   Prenatal Vit-Fe Fumarate-FA (PRENATAL MULTIVITAMIN) TABS tablet Take 1 tablet by mouth daily.      sertraline (ZOLOFT) 50 MG tablet TAKE 1 TABLET(50 MG) BY MOUTH DAILY 270 tablet 2   traZODone (DESYREL) 50 MG tablet Take 1 tablet (50 mg total) by mouth at bedtime. For sleep 30 tablet 0   No current facility-administered medications for this visit.     Musculoskeletal: Strength & Muscle Tone: Unable to assess due to telemedicine visit [place]: Unable to assess due to telemedicine visit Patient leans: Unable to assess due to telemedicine visit  Psychiatric Specialty Exam: Review of Systems  Psychiatric/Behavioral:  Negative for decreased concentration, dysphoric mood, hallucinations, self-injury, sleep disturbance and suicidal ideas. The patient is nervous/anxious. The patient is not hyperactive.      not currently breastfeeding.There is no height or weight on file to calculate BMI.  General Appearance: Unable to assess due to telemedicine visit  Eye Contact:  Unable to assess due to telemedicine visit  Speech:  Clear and Coherent and Normal Rate  Volume:  Normal  Mood:  Anxious and Euthymic  Affect:  Appropriate and Congruent  Thought Process:  Coherent and Descriptions of Associations: Intact  Orientation:  Full (Time, Place, and Person)  Thought Content: WDL   Suicidal Thoughts:  No  Homicidal Thoughts:  No  Memory:  Immediate;   Good Recent;   Good Remote;   Good  Judgement:  Good  Insight:  Good  Psychomotor Activity:  Normal  Concentration:  Concentration: Good and Attention Span: Good  Recall:  Good  Fund of Knowledge: Good  Language: Good  Akathisia:  No  Handed:  Right  AIMS (if indicated): not done  Assets:  Communication Skills Desire for Improvement Housing Intimacy Social Support Vocational/Educational  ADL's:  Intact  Cognition: WNL  Sleep:  Good   Screenings: AIMS    Flowsheet Row Admission (Discharged) from 05/26/2020 in [facility]  AIMS Total Score 0      AUDIT    Flowsheet Row Admission (Discharged) from 05/26/2020 in [facility]  Alcohol Use Disorder Identification Test Final Score (AUDIT) 0      GAD-7    Flowsheet Row Video Visit from 09/01/2021 in [facility] Video Visit from 07/04/2021 in [facility] Video Visit from 05/03/2021 in [facility] Video Visit from 02/15/2021 in [facility] Video Visit from 12/15/2020 in [facility]  Total GAD-7 Score 13 7 7 10 6       PHQ2-9    Flowsheet Row Video Visit from 09/01/2021 in [facility] Video Visit from 07/04/2021 in [facility] Video  Visit from 05/03/2021 in [facility] Video Visit from 02/15/2021 in [facility] Video Visit from 12/15/2020 in [facility]  PHQ-2 Total Score 3 1 0 2 2  PHQ-9 Total Score 15 -- -- 11 11      Flowsheet Row Video Visit from 09/01/2021 in [facility] Video Visit from 07/04/2021 in [facility] Video Visit from 05/03/2021 in [facility] No Risk No Risk No  Risk        Assessment and Plan:   Sonoma Thissen is a 26 year old female with a past psychiatric history significant for PTSD, major depressive disorder, and generalized anxiety disorder who presents to [facility] via virtual telephone visit for follow-up and medication management.  Spanish interpretive services were utilized during the duration of the encounter via telephone.  Patient reports that she has been dealing with ongoing anxiety that has been going on for a week attributed to a trip to [place] that she is planning with her family as well as therapy sessions that her child has been attending.  Patient states that her current medication has not been helpful in the management of her anxiety.  Provider recommended increasing patient's sertraline from 100 mg to 150 mg for the management of her anxiety.  Patient was also recommended buspirone 7.5 mg 2 times daily for the management of her anxiety.  Patient is agreeable to recommendations.  Patient's medications to be prescribed to pharmacy of choice.  Collaboration of Care: Collaboration of Care: Medication Management AEB provider managing patient's psychiatric medications, Primary Care Provider AEB patient being followed by her primary care provider, Psychiatrist AEB patient being followed by mental health provider, and Other provider involved in patient's care AEB patient  being followed by Bariatrics  Patient/Guardian was advised Release of Information must be obtained prior to any record release in order to collaborate their care with an outside provider. Patient/Guardian was advised if they have not already done so to contact the registration department to sign all necessary forms in order for [place] to release information regarding their care.   Consent: Patient/Guardian gives verbal consent for treatment and assignment of benefits for services provided during this visit. Patient/Guardian expressed understanding and agreed to proceed.   1. PTSD (post-traumatic stress disorder)   2. Generalized anxiety disorder  - busPIRone (BUSPAR) 7.5 MG tablet; Take 1 tablet (7.5 mg total) by mouth 2 (two) times daily.  Dispense: 60 tablet; Refill: 2  3. Mild episode of recurrent major depressive disorder [facility])   Patient to follow up in 2 months Provider spent a total of 27 minutes with the patient/reviewing patient's chart  Malachy Mood, PA 09/04/2021, 11:43 PM

## 2021-11-10 ENCOUNTER — Telehealth (INDEPENDENT_AMBULATORY_CARE_PROVIDER_SITE_OTHER): Payer: Medicaid Other | Admitting: Physician Assistant

## 2021-11-10 ENCOUNTER — Encounter (HOSPITAL_COMMUNITY): Payer: Self-pay | Admitting: Physician Assistant

## 2021-11-10 DIAGNOSIS — F411 Generalized anxiety disorder: Secondary | ICD-10-CM

## 2021-11-10 DIAGNOSIS — F330 Major depressive disorder, recurrent, mild: Secondary | ICD-10-CM | POA: Diagnosis not present

## 2021-11-10 DIAGNOSIS — F4310 Post-traumatic stress disorder, unspecified: Secondary | ICD-10-CM | POA: Diagnosis not present

## 2021-11-10 MED ORDER — BUSPIRONE HCL 7.5 MG PO TABS: 7.5000 mg | ORAL_TABLET | Freq: Two times a day (BID) | ORAL | 2 refills | Status: DC

## 2021-11-10 MED ORDER — SERTRALINE HCL 50 MG PO TABS: ORAL_TABLET | ORAL | 2 refills | Status: DC

## 2021-11-10 NOTE — Progress Notes (Unsigned)
BH MD/PA/NP OP Progress Note  Virtual Visit via Telephone Note  I connected with Lisa Herrera on 11/10/21 at  2:30 PM EDT by telephone and verified that I am speaking with the correct person using two identifiers.  Location: Patient: Home Provider: Clinic   I discussed the limitations, risks, security and privacy concerns of performing an evaluation and management service by telephone and the availability of in person appointments. I also discussed with the patient that there may be a patient responsible charge related to this service. The patient expressed understanding and agreed to proceed.  Follow Up Instructions:  I discussed the assessment and treatment plan with the patient. The patient was provided an opportunity to ask questions and all were answered. The patient agreed with the plan and demonstrated an understanding of the instructions.   The patient was advised to call back or seek an in-person evaluation if the symptoms worsen or if the condition fails to improve as anticipated.  I provided 25 minutes of non-face-to-face time during this encounter.  Meta Hatchet, PA   11/10/2021 8:14 PM Lisa Herrera  MRN:  [id number]  Chief Complaint:  Chief Complaint  Patient presents with   Medication Refill   Follow-up    HPI:   Lisa Herrera is a 26 year old female with a past psychiatric history significant for PTSD, major depressive disorder, and generalized anxiety disorder who presents to [facility] via virtual telephone visit for follow-up and medication management.  Spanish interpretive services were utilized during the duration of the encounter via telephone.  Patient is currently being managed on the following medication:   Sertraline 50 mg daily Buspirone 7.5 mg 2 times daily  Patient reports that she is doing well and has no issues with her current medication regimen.  She reports that she occasionally hears buzzing sound in  her ear roughly 1-3 times a month.  Patient states that the buzzing in her ear last for roughly 2 seconds before going away.  In addition to the ringing in her ear, patient also endorses constipation and abdominal pain.  Patient denies any issues with her activities of daily living.  She reports that she recently got the opinion of another provider and learned that the side effects were common with taking sertraline.  Patient denies experiencing depression but does endorse some mild anxiety she rates at 2 out of 10.  Patient denies any new stressors.  A GAD-7 screen was performed with the patient scoring a 7.  Patient is alert and oriented x4, calm, cooperative, and fully engaged in conversation during the encounter.  Patient endorses feeling a little tired.  Patient denies suicidal or homicidal ideations.  She further denies auditory or visual hallucinations and does not appear to be responding to internal/external stimuli.  Patient endorses good sleep and receives on average 6 to 8 hours of sleep each night.  Patient endorses good appetite and eats on average 2 to 3/day.  Patient denies alcohol consumption, tobacco use, and illicit drug use.  Visit Diagnosis:    ICD-10-CM   1. Generalized anxiety disorder  F41.1 busPIRone (BUSPAR) 7.5 MG tablet    sertraline (ZOLOFT) 50 MG tablet    2. PTSD (post-traumatic stress disorder)  F43.10 sertraline (ZOLOFT) 50 MG tablet    3. Mild episode of recurrent major depressive disorder (HCC)  F33.0 sertraline (ZOLOFT) 50 MG tablet      Past Psychiatric History:  Major depressive disorder Generalized anxiety disorder PTSD  Past Medical History:  Past Medical History:  Diagnosis Date   Alpha thalassemia silent carrier 05/29/2019   Gallstones    Hepatic steatosis 02/04/2020   Migraine    Umbilical hernia without obstruction and without gangrene 02/04/2020    Past Surgical History:  Procedure Laterality Date   CESAREAN SECTION N/A 01/14/2016   Procedure:  CESAREAN SECTION;  Surgeon: Tereso Newcomer, MD;  Location: [facility];  Service: Obstetrics;  Laterality: N/A;   CESAREAN SECTION N/A 11/17/2019   Procedure: CESAREAN SECTION;  Surgeon: Levie Heritage, DO;  Location: [facility] LD [facility];  Service: Obstetrics;  Laterality: N/A;   CHOLECYSTECTOMY     TUBAL LIGATION Bilateral 11/17/2019   Procedure: BILATERAL TUBAL LIGATION;  Surgeon: Levie Heritage, DO;  Location: [facility] LD [facility];  Service: Obstetrics;  Laterality: Bilateral;    Family Psychiatric History:  Grandfather - patient states that her grandfather may have been schizophrenic but he was never officially diagnosed   Brother - patient states that her brother has been assessed for schizophrenia but has never been diagnosed  Family History:  Family History  Problem Relation Age of Onset   Breast cancer Maternal Aunt        50's   Colon cancer Neg Hx    Stomach cancer Neg Hx    Esophageal cancer Neg Hx     Social History:  Social History   Socioeconomic History   Marital status: Married    Spouse name: Not on file   Number of children: 1   Years of education: Not on file   Highest education level: Not on file  Occupational History   Not on file  Tobacco Use   Smoking status: Never   Smokeless tobacco: Never  Vaping Use   Vaping Use: Never used  Substance and Sexual Activity   Alcohol use: No    Alcohol/week: 0.0 standard drinks of alcohol   Drug use: No   Sexual activity: Yes    Birth control/protection: None  Other Topics Concern   Not on file  Social History Narrative   Married, originally from [place], Spanish-speaking.   Son born October 2021 and a daughter born in 2017   No alcohol no tobacco or drug use   Social Determinants of Corporate investment banker Strain: Not on file  Food Insecurity: Not on file  Transportation Needs: Not on file  Physical Activity: Not on file  Stress: Not on file  Social Connections: Not on file    Allergies:  Allergies   Allergen Reactions   Bupropion     Anxiousness Tachycardia    Metabolic Disorder Labs: Lab Results  Component Value Date   HGBA1C 5.3 05/25/2020   MPG 105.41 05/25/2020   No results found for: "PROLACTIN" Lab Results  Component Value Date   CHOL 187 05/25/2020   TRIG 111 05/25/2020   HDL 55 05/25/2020   CHOLHDL 3.4 05/25/2020   VLDL 22 05/25/2020   LDLCALC 110 (H) 05/25/2020   Lab Results  Component Value Date   TSH 1.836 05/25/2020    Therapeutic Level Labs: No results found for: "LITHIUM" No results found for: "VALPROATE" No results found for: "CBMZ"  Current Medications: Current Outpatient Medications  Medication Sig Dispense Refill   busPIRone (BUSPAR) 7.5 MG tablet Take 1 tablet (7.5 mg total) by mouth 2 (two) times daily. 60 tablet 2   hydrOXYzine (ATARAX/VISTARIL) 25 MG tablet Take 1 tablet (25 mg total) by mouth every 6 (six) hours as needed for anxiety. 75 tablet  0   omeprazole (PRILOSEC) 40 MG capsule Take 1 capsule (40 mg total) by mouth daily before breakfast. 30 capsule 2   Prenatal Vit-Fe Fumarate-FA (PRENATAL MULTIVITAMIN) TABS tablet Take 1 tablet by mouth daily.      sertraline (ZOLOFT) 50 MG tablet TAKE 1 TABLET(50 MG) BY MOUTH DAILY 270 tablet 2   traZODone (DESYREL) 50 MG tablet Take 1 tablet (50 mg total) by mouth at bedtime. For sleep 30 tablet 0   No current facility-administered medications for this visit.     Musculoskeletal: Strength & Muscle Tone: Unable to assess due to telemedicine visit Gait & Station: Unable to assess due to telemedicine visit Patient leans: Unable to assess due to telemedicine visit  Psychiatric Specialty Exam: Review of Systems  Psychiatric/Behavioral:  Negative for decreased concentration, dysphoric mood, hallucinations, self-injury, sleep disturbance and suicidal ideas. The patient is nervous/anxious. The patient is not hyperactive.     not currently breastfeeding.There is no height or weight on file to  calculate BMI.  General Appearance: Unable to assess due to telemedicine visit  Eye Contact:  Unable to assess due to telemedicine visit  Speech:  Clear and Coherent and Normal Rate  Volume:  Normal  Mood:  Anxious and Euthymic  Affect:  Appropriate and Congruent  Thought Process:  Coherent and Descriptions of Associations: Intact  Orientation:  Full (Time, Place, and Person)  Thought Content: WDL   Suicidal Thoughts:  No  Homicidal Thoughts:  No  Memory:  Immediate;   Good Recent;   Good Remote;   Good  Judgement:  Good  Insight:  Good  Psychomotor Activity:  Normal  Concentration:  Concentration: Good and Attention Span: Good  Recall:  Good  Fund of Knowledge: Good  Language: Good  Akathisia:  No  Handed:  Right  AIMS (if indicated): not done  Assets:  Communication Skills Desire for Improvement Housing Intimacy Social Support Vocational/Educational  ADL's:  Intact  Cognition: WNL  Sleep:  Good   Screenings: AIMS    Flowsheet Row [facility]  AIMS Total Score 0      AUDIT    Flowsheet Row [facility]  Alcohol Use Disorder Identification Test Final Score (AUDIT) 0      GAD-7    Flowsheet Row Video Visit from 11/10/2021 in [facility] Video Visit from 09/01/2021 in [facility] Video Visit from 07/04/2021 in [facility] [place] Video Visit from 05/03/2021 in [facility] Visit from [facility]15/2021 [facility]  Total GAD-7 Score 7 13 7 7 10       PHQ2-9    Flowsheet Row Video Visit from 11/10/2021 in [facility] Video Visit from 09/01/2021 in [facility] Video Visit from 07/04/2021 in [facility] Video  Visit from 05/03/2021 in [facility] Video Visit from 02/15/2021 in [facility]  PHQ-2 Total Score 0 3 1 0 2  PHQ-9 Total Score -- 15 -- -- 11      Flowsheet Row Video Visit from 11/10/2021 in [facility] Video Visit from 09/01/2021 in [facility] Video Visit from 07/04/2021 in [facility]  C-SSRS RISK CATEGORY No Risk No Risk No Risk  Assessment and Plan:   Lisa Herrera is a 26 year old female with a past psychiatric history significant for PTSD, major depressive disorder, and generalized anxiety disorder who presents to [facility] via virtual telephone visit for follow-up and medication management.  Spanish interpretive services were utilized during the duration of the encounter via telephone.  Patient reports that she is doing well on her medications but has been experiencing some side effects from the use of her medication.  Patient denies any side effects affecting her activity of daily living.  Patient was encouraged to continue taking her medication if her activity of daily living was not affected.  Patient was agreeable to recommendation.  Patient's medication to be prescribed to pharmacy of choice.  Collaboration of Care: Collaboration of Care: Medication Management AEB provider managing patient's psychiatric medications, Primary Care Provider AEB patient being followed by her primary care provider, Psychiatrist AEB patient being followed by mental health provider, and Other provider involved in patient's care AEB patient being followed by Bariatrics  Patient/Guardian was advised Release of Information must be obtained prior to any record release in order to collaborate their care with an outside provider. Patient/Guardian was advised if they have not already done so to contact the registration department  to sign all necessary forms in order for [place] to release information regarding their care.   Consent: Patient/Guardian gives verbal consent for treatment and assignment of benefits for services provided during this visit. Patient/Guardian expressed understanding and agreed to proceed.   1. Generalized anxiety disorder  - busPIRone (BUSPAR) 7.5 MG tablet; Take 1 tablet (7.5 mg total) by mouth 2 (two) times daily.  Dispense: 60 tablet; Refill: 2 - sertraline (ZOLOFT) 50 MG tablet; TAKE 1 TABLET(50 MG) BY MOUTH DAILY  Dispense: 270 tablet; Refill: 2  2. PTSD (post-traumatic stress disorder)  - sertraline (ZOLOFT) 50 MG tablet; TAKE 1 TABLET(50 MG) BY MOUTH DAILY  Dispense: 270 tablet; Refill: 2  3. Mild episode of recurrent major depressive disorder (HCC)  - sertraline (ZOLOFT) 50 MG tablet; TAKE 1 TABLET(50 MG) BY MOUTH DAILY  Dispense: 270 tablet; Refill: 2  Patient to follow up in 2 months Provider spent a total of 25 minutes with the patient/reviewing patient's chart  Meta Hatchet, PA 11/10/2021, 8:14 PM

## 2021-12-03 ENCOUNTER — Emergency Department (HOSPITAL_BASED_OUTPATIENT_CLINIC_OR_DEPARTMENT_OTHER)
Admission: EM | Admit: 2021-12-03 | Discharge: 2021-12-03 | Disposition: A | Discharge status: Home / Self Care | Payer: Medicaid Other | Attending: Emergency Medicine | Admitting: Emergency Medicine

## 2021-12-03 ENCOUNTER — Other Ambulatory Visit: Payer: Self-pay

## 2021-12-03 ENCOUNTER — Encounter (HOSPITAL_BASED_OUTPATIENT_CLINIC_OR_DEPARTMENT_OTHER): Payer: Self-pay

## 2021-12-03 ENCOUNTER — Emergency Department (HOSPITAL_BASED_OUTPATIENT_CLINIC_OR_DEPARTMENT_OTHER): Payer: Medicaid Other

## 2021-12-03 DIAGNOSIS — R1031 Right lower quadrant pain: Secondary | ICD-10-CM | POA: Diagnosis not present

## 2021-12-03 DIAGNOSIS — R109 Unspecified abdominal pain: Secondary | ICD-10-CM | POA: Diagnosis present

## 2021-12-03 DIAGNOSIS — R1032 Left lower quadrant pain: Secondary | ICD-10-CM | POA: Diagnosis not present

## 2021-12-03 LAB — COMPREHENSIVE METABOLIC PANEL
ALT: 30 U/L (ref 0–44)
AST: 19 U/L (ref 15–41)
Albumin: 4.3 g/dL (ref 3.5–5.0)
Alkaline Phosphatase: 67 U/L (ref 38–126)
Anion gap: 9 (ref 5–15)
BUN: 13 mg/dL (ref 6–20)
CO2: 23 mmol/L (ref 22–32)
Calcium: 9.6 mg/dL (ref 8.9–10.3)
Chloride: 104 mmol/L (ref 98–111)
Creatinine, Ser: 0.69 mg/dL (ref 0.44–1.00)
GFR, Estimated: >60 mL/min (ref >60)
Glucose, Bld: 116 mg/dL — ABNORMAL HIGH (ref 70–99)
Potassium: 4.1 mmol/L (ref 3.5–5.1)
Sodium: 136 mmol/L (ref 135–145)
Total Bilirubin: 0.5 mg/dL (ref 0.3–1.2)
Total Protein: 6.9 g/dL (ref 6.5–8.1)

## 2021-12-03 LAB — URINALYSIS, ROUTINE W REFLEX MICROSCOPIC
Bilirubin Urine: NEGATIVE
Glucose, UA: NEGATIVE mg/dL
Hgb urine dipstick: NEGATIVE
Ketones, ur: NEGATIVE mg/dL
Leukocytes,Ua: NEGATIVE
Nitrite: NEGATIVE
Protein, ur: NEGATIVE mg/dL
Specific Gravity, Urine: 1.016 (ref 1.005–1.030)
pH: 6 (ref 5.0–8.0)

## 2021-12-03 LAB — CBC
HCT: 41.7 % (ref 36.0–46.0)
Hemoglobin: 13.8 g/dL (ref 12.0–15.0)
MCH: 29 pg (ref 26.0–34.0)
MCHC: 33.1 g/dL (ref 30.0–36.0)
MCV: 87.6 fL (ref 80.0–100.0)
Platelets: 309 10*3/uL (ref 150–400)
RBC: 4.76 MIL/uL (ref 3.87–5.11)
RDW: 12.8 % (ref 11.5–15.5)
WBC: 7.9 10*3/uL (ref 4.0–10.5)
nRBC: 0 % (ref 0.0–0.2)

## 2021-12-03 LAB — LIPASE, BLOOD: Lipase: 11 U/L (ref 11–51)

## 2021-12-03 LAB — HCG, SERUM, QUALITATIVE: Preg, Serum: NEGATIVE

## 2021-12-03 MED ORDER — KETOROLAC TROMETHAMINE 15 MG/ML IJ SOLN
15.0000 mg | Freq: Once | INTRAMUSCULAR | Status: AC
  Administered 2021-12-03: 15 mg via INTRAVENOUS
  Filled 2021-12-03: qty 1

## 2021-12-03 MED ORDER — IOHEXOL 300 MG/ML  SOLN
100.0000 mL | Freq: Once | INTRAMUSCULAR | Status: AC | PRN
  Administered 2021-12-03: 100 mL via INTRAVENOUS

## 2021-12-03 NOTE — Discharge Instructions (Addendum)
All your testing today was normal.  It might be something related to your back or a muscle causing the pain.  It is okay to take your anti-inflammatories.  You start running fever, having persistent vomiting, inability to urinate or other concerns return to the emergency room

## 2021-12-03 NOTE — ED Triage Notes (Signed)
Patient here POV from Home.  Endorses Right Sided Flank Pain that is radiating to RLQ and Right Pelvic Area. States it began [place] but was tolerable and worsened today.   Nausea. No Emesis or Diarrhea. Some Burning with Urination.   Uncomfortable During Triage. A&Ox4. GCS 15. BIB Wheelchair.
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
## 2021-12-03 NOTE — ED Notes (Signed)
ED Provider at bedside. 

## 2021-12-26 ENCOUNTER — Ambulatory Visit
Admission: RE | Admit: 2021-12-26 | Discharge: 2021-12-26 | Disposition: A | Discharge status: Home / Self Care | Payer: Medicaid Other | Source: Ambulatory Visit | Attending: Physician Assistant | Admitting: Physician Assistant

## 2021-12-26 ENCOUNTER — Other Ambulatory Visit: Payer: Self-pay | Admitting: Physician Assistant

## 2021-12-26 ENCOUNTER — Other Ambulatory Visit (HOSPITAL_COMMUNITY): Payer: Self-pay | Admitting: Physician Assistant

## 2021-12-26 DIAGNOSIS — M25571 Pain in right ankle and joints of right foot: Secondary | ICD-10-CM

## 2021-12-26 DIAGNOSIS — F330 Major depressive disorder, recurrent, mild: Secondary | ICD-10-CM

## 2021-12-26 DIAGNOSIS — F4310 Post-traumatic stress disorder, unspecified: Secondary | ICD-10-CM

## 2021-12-26 DIAGNOSIS — F411 Generalized anxiety disorder: Secondary | ICD-10-CM

## 2022-01-04 ENCOUNTER — Other Ambulatory Visit: Payer: Self-pay | Admitting: Sports Medicine

## 2022-01-04 ENCOUNTER — Ambulatory Visit
Admission: RE | Admit: 2022-01-04 | Discharge: 2022-01-04 | Disposition: A | Discharge status: Home / Self Care | Payer: Medicaid Other | Source: Ambulatory Visit | Attending: Sports Medicine | Admitting: Sports Medicine

## 2022-01-04 DIAGNOSIS — M25571 Pain in right ankle and joints of right foot: Secondary | ICD-10-CM

## 2022-01-04 DIAGNOSIS — M545 Low back pain, unspecified: Secondary | ICD-10-CM

## 2022-01-12 ENCOUNTER — Telehealth (HOSPITAL_COMMUNITY): Payer: Medicaid Other | Admitting: Physician Assistant

## 2022-03-02 ENCOUNTER — Encounter (HOSPITAL_COMMUNITY): Payer: Medicaid Other | Admitting: Psychiatry

## 2022-03-02 ENCOUNTER — Telehealth (HOSPITAL_COMMUNITY): Payer: Self-pay | Admitting: Psychiatry
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
## 2022-03-20 ENCOUNTER — Other Ambulatory Visit: Payer: Self-pay

## 2022-03-20 ENCOUNTER — Encounter (HOSPITAL_BASED_OUTPATIENT_CLINIC_OR_DEPARTMENT_OTHER): Payer: Self-pay

## 2022-03-20 DIAGNOSIS — R059 Cough, unspecified: Secondary | ICD-10-CM | POA: Diagnosis present

## 2022-03-20 DIAGNOSIS — Z1152 Encounter for screening for COVID-19: Secondary | ICD-10-CM | POA: Diagnosis not present

## 2022-03-20 DIAGNOSIS — J101 Influenza due to other identified influenza virus with other respiratory manifestations: Secondary | ICD-10-CM | POA: Diagnosis not present

## 2022-03-20 MED ORDER — ACETAMINOPHEN 500 MG PO TABS
1000.0000 mg | ORAL_TABLET | Freq: Once | ORAL | Status: AC | PRN
  Administered 2022-03-20: 1000 mg via ORAL
  Filled 2022-03-20: qty 2

## 2022-03-20 NOTE — ED Triage Notes (Signed)
Patient here POV from Home.  Endorses Fever, Cough, Right Otalgia. Began yesterday.   NAD Noted during Triage. A&Ox4. GCS 15. Ambulatory.

## 2022-03-21 ENCOUNTER — Emergency Department (HOSPITAL_BASED_OUTPATIENT_CLINIC_OR_DEPARTMENT_OTHER)
Admission: EM | Admit: 2022-03-21 | Discharge: 2022-03-21 | Disposition: A | Discharge status: Home / Self Care | Payer: Medicaid Other | Attending: Emergency Medicine | Admitting: Emergency Medicine

## 2022-03-21 DIAGNOSIS — J111 Influenza due to unidentified influenza virus with other respiratory manifestations: Secondary | ICD-10-CM

## 2022-03-21 LAB — RESP PANEL BY RT-PCR (RSV, FLU A&B, COVID)  RVPGX2
Influenza A by PCR: POSITIVE — AB
Influenza B by PCR: NEGATIVE
Resp Syncytial Virus by PCR: NEGATIVE
SARS Coronavirus 2 by RT PCR: NEGATIVE

## 2022-03-21 LAB — GROUP A STREP BY PCR: Group A Strep by PCR: NOT DETECTED

## 2022-03-21 MED ORDER — IBUPROFEN 800 MG PO TABS
800.0000 mg | ORAL_TABLET | Freq: Once | ORAL | Status: AC
  Administered 2022-03-21: 800 mg via ORAL
  Filled 2022-03-21: qty 1

## 2022-03-21 MED ORDER — OSELTAMIVIR PHOSPHATE 75 MG PO CAPS: 75.0000 mg | ORAL_CAPSULE | Freq: Two times a day (BID) | ORAL | 0 refills | Status: AC

## 2022-03-21 NOTE — ED Provider Notes (Addendum)
[facility] EMERGENCY DEPT Provider Note   CSN: [id number] Arrival date & time: 03/20/22  2123     History  Chief Complaint  Patient presents with   Cough    Lisa Herrera is a 26 y.o. female.  Patient here with cough and bodyaches for the last 2 days.  Nothing makes it worse or better.  No significant medical problems.  Denies any nausea, vomiting, diarrhea.  Took TylenolWith some improvement.  Denies any abdominal pain, chest pain or shortness of breath.  Nothing seems to be helping at home.  The history is provided by the patient.       Home Medications Prior to Admission medications   Medication Sig Start Date End Date Taking? Authorizing Provider  oseltamivir (TAMIFLU) 75 MG capsule Take 1 capsule (75 mg total) by mouth every 12 (twelve) hours. 03/21/22  Yes Lin Glazier, DO  busPIRone (BUSPAR) 7.5 MG tablet Take 1 tablet (7.5 mg total) by mouth 2 (two) times daily. 11/10/21 11/10/22  Nwoko, Tommas Olp, PA  hydrOXYzine (ATARAX/VISTARIL) 25 MG tablet Take 1 tablet (25 mg total) by mouth every 6 (six) hours as needed for anxiety. 06/02/20   Armandina Stammer I, NP  omeprazole (PRILOSEC) 40 MG capsule Take 1 capsule (40 mg total) by mouth daily before breakfast. 02/16/20   Iva Boop, MD  Prenatal Vit-Fe Fumarate-FA (PRENATAL MULTIVITAMIN) TABS tablet Take 1 tablet by mouth daily.     [provider]  sertraline (ZOLOFT) 50 MG tablet TAKE 1 TABLET(50 MG) BY MOUTH DAILY 11/10/21   Nwoko, Tommas Olp, PA  traZODone (DESYREL) 50 MG tablet Take 1 tablet (50 mg total) by mouth at bedtime. For sleep 06/02/20   Armandina Stammer I, NP      Allergies    Bupropion    Review of Systems   Review of Systems  Physical Exam Updated Vital Signs BP 138/83 (BP Location: Right Wrist)   Pulse 100   Temp (!) 101.9 F (38.8 C) (Oral)   Resp 20   Ht 5' (1.524 m)   Wt 110.7 kg   SpO2 99%   BMI 47.66 kg/m  Physical Exam Vitals and nursing note reviewed.  Constitutional:       General: She is not in acute distress.    Appearance: She is well-developed.  HENT:     Head: Normocephalic and atraumatic.     Nose: Nose normal.     Mouth/Throat:     Mouth: Mucous membranes are moist.  Eyes:     Extraocular Movements: Extraocular movements intact.     Conjunctiva/sclera: Conjunctivae normal.     Pupils: Pupils are equal, round, and reactive to light.  Cardiovascular:     Rate and Rhythm: Normal rate and regular rhythm.     Pulses: Normal pulses.     Heart sounds: Normal heart sounds. No murmur heard. Pulmonary:     Effort: Pulmonary effort is normal. No respiratory distress.     Breath sounds: Normal breath sounds.  Abdominal:     Palpations: Abdomen is soft.     Tenderness: There is no abdominal tenderness.  Musculoskeletal:        General: No swelling.     Cervical back: Neck supple.  Skin:    General: Skin is warm and dry.     Capillary Refill: Capillary refill takes less than 2 seconds.  Neurological:     Mental Status: She is alert.  Psychiatric:        Mood and Affect: Mood  normal.     ED Results / Procedures / Treatments   Labs (all labs ordered are listed, but only abnormal results are displayed) Labs Reviewed  RESP PANEL BY RT-PCR (RSV, FLU A&B, COVID)  [id number] - Abnormal; Notable for the following components:      Result Value   Influenza A by PCR POSITIVE (*)    All other components within normal limits  GROUP A STREP BY PCR    EKG None  Radiology No results found.  Procedures Procedures    Medications Ordered in ED Medications  ibuprofen (ADVIL) tablet 800 mg (has no administration in time range)  acetaminophen (TYLENOL) tablet 1,000 mg (1,000 mg Oral Given 03/20/22 2206)    ED Course/ Medical Decision Making/ A&P                           Medical Decision Making Risk OTC drugs. Prescription drug management.   Lisa Herrera is here with flulike symptoms.  Symptoms the last 2 days.  She is febrile but otherwise  vitals unremarkable.  Suspect COVID or strep.  May be flu.  Per my review and interpretation labs patient is positive for influenza A.  Negative for strep.  Patient given Tylenol and ibuprofen.  Will prescribe Tamiflu.  Discharged in good condition.  Understands return precautions.  This chart was dictated using voice recognition software.  Despite best efforts to proofread,  errors can occur which can change the documentation meaning.         Final Clinical Impression(s) / ED Diagnoses Final diagnoses:  Influenza    Rx / DC Orders ED Discharge Orders          Ordered    oseltamivir (TAMIFLU) 75 MG capsule  Every 12 hours        03/21/22 0109              Lennice Sites, DO 03/21/22 0110    Exavior Kimmons, DO 03/21/22 0111

## 2022-03-21 NOTE — Discharge Instructions (Signed)
Continue Tylenol and ibuprofen.  1000 mg of Tylenol every 6 hours as needed.  400 mg ibuprofen every 8 hours as needed.

## 2022-04-10 ENCOUNTER — Encounter (HOSPITAL_COMMUNITY): Payer: Self-pay | Admitting: Physician Assistant

## 2022-04-10 ENCOUNTER — Telehealth (INDEPENDENT_AMBULATORY_CARE_PROVIDER_SITE_OTHER): Payer: Medicaid Other | Admitting: Physician Assistant

## 2022-04-10 DIAGNOSIS — F411 Generalized anxiety disorder: Secondary | ICD-10-CM | POA: Diagnosis not present

## 2022-04-10 DIAGNOSIS — F330 Major depressive disorder, recurrent, mild: Secondary | ICD-10-CM

## 2022-04-10 DIAGNOSIS — F4310 Post-traumatic stress disorder, unspecified: Secondary | ICD-10-CM | POA: Diagnosis not present

## 2022-04-10 MED ORDER — SERTRALINE HCL 50 MG PO TABS: 50.0000 mg | ORAL_TABLET | Freq: Every day | ORAL | 2 refills | Status: DC

## 2022-04-10 MED ORDER — BUSPIRONE HCL 7.5 MG PO TABS: 7.5000 mg | ORAL_TABLET | Freq: Two times a day (BID) | ORAL | 2 refills | Status: DC

## 2022-04-10 NOTE — Progress Notes (Signed)
BH MD/PA/NP OP Progress Note  Virtual Visit via Video Note  I connected with Lisa Herrera on 04/10/22 at  3:00 PM EST by a video enabled telemedicine application and verified that I am speaking with the correct person using two identifiers.  Location: Patient: Home Provider: Clinic   I discussed the limitations of evaluation and management by telemedicine and the availability of in person appointments. The patient expressed understanding and agreed to proceed.  Follow Up Instructions:   I discussed the assessment and treatment plan with the patient. The patient was provided an opportunity to ask questions and all were answered. The patient agreed with the plan and demonstrated an understanding of the instructions.   The patient was advised to call back or seek an in-person evaluation if the symptoms worsen or if the condition fails to improve as anticipated.  I provided 30 minutes of non-face-to-face time during this encounter.  Meta Hatchet, PA    04/10/2022 7:00 PM Lisa Herrera  MRN:  [id number]  Chief Complaint:  Chief Complaint  Patient presents with   Follow-up   Medication Refill   HPI:   Lisa Herrera is a 27 year old, Hispanic female with a past psychiatric history significant for PTSD, major depressive disorder, and generalized anxiety disorder who presents to [facility] via virtual video visit for follow-up and medication management. Spanish interpretive services were utilized during the duration of the encounter via telephone. Patient is currently being managed on the following medication:   Buspirone 7.5 mg 2 times daily Sertraline 50 mg daily  Patient reports that she is good and doing well.  She reports that the pharmacy would not fill her patients due to not having a proper prescription.  Patient reports that she last used her medications last Saturday but denies any significant issues at this time.  Patient denies  depression and endorses minimal anxiety.  Patient rates her anxiety at 3 out of 10 and states that her main stressor is related to school and doing tests.  A GAD-7 screen was performed with the patient scoring a 6.  Patient is alert and oriented x 4, calm, cooperative, and fully engaged in conversation during the encounter.  Patient states that she is currently nervous but relaxed and trying to manage things at work.  Patient denies suicidal or homicidal ideations.  She further denies auditory or visual hallucinations and does not appear to be responding to internal/external stimuli.  Patient endorses fair sleep and receives on average 4 to 6 hours of sleep per night.  Patient endorses good appetite and eats on average 3 meals per day.  Patient denies alcohol consumption, tobacco use, and illicit drug use.  Visit Diagnosis:    ICD-10-CM   1. Generalized anxiety disorder  F41.1 busPIRone (BUSPAR) 7.5 MG tablet    sertraline (ZOLOFT) 50 MG tablet    2. PTSD (post-traumatic stress disorder)  F43.10 sertraline (ZOLOFT) 50 MG tablet    3. Mild episode of recurrent major depressive disorder (HCC)  F33.0 sertraline (ZOLOFT) 50 MG tablet      Past Psychiatric History:  Major depressive disorder Generalized anxiety disorder PTSD  Past Medical History:  Past Medical History:  Diagnosis Date   Alpha thalassemia silent carrier 05/29/2019   Gallstones    Hepatic steatosis 02/04/2020   Migraine    Umbilical hernia without obstruction and without gangrene 02/04/2020    Past Surgical History:  Procedure Laterality Date   CESAREAN SECTION N/A 01/14/2016   Procedure: CESAREAN  SECTION;  Surgeon: Tereso Newcomer, MD;  Location: [facility];  Service: Obstetrics;  Laterality: N/A;   CESAREAN SECTION N/A 11/17/2019   Procedure: CESAREAN SECTION;  Surgeon: Levie Heritage, DO;  Location: [facility];  Service: Obstetrics;  Laterality: N/A;   CHOLECYSTECTOMY     TUBAL LIGATION Bilateral 11/17/2019    Procedure: BILATERAL TUBAL LIGATION;  Surgeon: Levie Heritage, DO;  Location: [facility];  Service: Obstetrics;  Laterality: Bilateral;    Family Psychiatric History:  Grandfather - patient states that her grandfather may have been schizophrenic but he was never officially diagnosed   Brother - patient states that her brother has been assessed for schizophrenia but has never been diagnosed  Family History:  Family History  Problem Relation Age of Onset   Breast cancer Maternal Aunt        50's   Colon cancer Neg Hx    Stomach cancer Neg Hx    Esophageal cancer Neg Hx     Social History:  Social History   Socioeconomic History   Marital status: Married    Spouse name: Not on file   Number of children: 1   Years of education: Not on file   Highest education level: Not on file  Occupational History   Not on file  Tobacco Use   Smoking status: Never   Smokeless tobacco: Never  Vaping Use   Vaping Use: Never used  Substance and Sexual Activity   Alcohol use: No    Alcohol/week: 0.0 standard drinks of alcohol   Drug use: No   Sexual activity: Yes    Birth control/protection: None  Other Topics Concern   Not on file  Social History Narrative   Married, originally from [place], Spanish-speaking.   Son born October 2021 and a daughter born in 2017   No alcohol no tobacco or drug use   Social Determinants of Corporate investment banker Strain: Not on file  Food Insecurity: Not on file  Transportation Needs: Not on file  Physical Activity: Not on file  Stress: Not on file  Social Connections: Not on file    Allergies:  Allergies  Allergen Reactions   Bupropion     Anxiousness Tachycardia    Metabolic Disorder Labs: Lab Results  Component Value Date   HGBA1C 5.3 05/25/2020   MPG 105.41 05/25/2020   No results found for: "PROLACTIN" Lab Results  Component Value Date   CHOL 187 05/25/2020   TRIG 111 05/25/2020   HDL 55 05/25/2020   CHOLHDL 3.4 05/25/2020    VLDL 22 05/25/2020   LDLCALC 110 (H) 05/25/2020   Lab Results  Component Value Date   TSH 1.836 05/25/2020    Therapeutic Level Labs: No results found for: "LITHIUM" No results found for: "VALPROATE" No results found for: "CBMZ"  Current Medications: Current Outpatient Medications  Medication Sig Dispense Refill   busPIRone (BUSPAR) 7.5 MG tablet Take 1 tablet (7.5 mg total) by mouth 2 (two) times daily. 60 tablet 2   hydrOXYzine (ATARAX/VISTARIL) 25 MG tablet Take 1 tablet (25 mg total) by mouth every 6 (six) hours as needed for anxiety. 75 tablet 0   omeprazole (PRILOSEC) 40 MG capsule Take 1 capsule (40 mg total) by mouth daily before breakfast. 30 capsule 2   oseltamivir (TAMIFLU) 75 MG capsule Take 1 capsule (75 mg total) by mouth every 12 (twelve) hours. 10 capsule 0   Prenatal Vit-Fe Fumarate-FA (PRENATAL MULTIVITAMIN) TABS tablet Take 1 tablet  by mouth daily.      sertraline (ZOLOFT) 50 MG tablet Take 1 tablet (50 mg total) by mouth daily. TAKE 1 TABLET(50 MG) BY MOUTH DAILY 90 tablet 2   traZODone (DESYREL) 50 MG tablet Take 1 tablet (50 mg total) by mouth at bedtime. For sleep 30 tablet 0   No current facility-administered medications for this visit.     Musculoskeletal: Strength & Muscle Tone: within normal limits Gait & Station: normal Patient leans: N/A  Psychiatric Specialty Exam: Review of Systems  Psychiatric/Behavioral:  Negative for decreased concentration, dysphoric mood, hallucinations, self-injury, sleep disturbance and suicidal ideas. The patient is nervous/anxious. The patient is not hyperactive.     There were no vitals taken for this visit.There is no height or weight on file to calculate BMI.  General Appearance: Casual  Eye Contact:  Good  Speech:  Clear and Coherent and Normal Rate  Volume:  Normal  Mood:   Patient exhibits mild depression and anxiety but is overall okay  Affect:  Congruent  Thought Process:  Coherent, Goal Directed, and  Descriptions of Associations: Intact  Orientation:  Full (Time, Place, and Person)  Thought Content: WDL   Suicidal Thoughts:  No  Homicidal Thoughts:  No  Memory:  Immediate;   Good Recent;   Good Remote;   Good  Judgement:  Good  Insight:  Good  Psychomotor Activity:  Normal  Concentration:  Concentration: Good and Attention Span: Good  Recall:  Good  Fund of Knowledge: Good  Language: Good  Akathisia:  No  Handed:  Right  AIMS (if indicated): not done  Assets:  Communication Skills Desire for Improvement Housing Intimacy Social Support Vocational/Educational  ADL's:  Intact  Cognition: WNL  Sleep:  Good   Screenings: AIMS    Flowsheet Row Admission (Discharged) from 05/26/2020 in [facility]  AIMS Total Score 0      AUDIT    Flowsheet Row Admission (Discharged) from 05/26/2020 in [facility]  Alcohol Use Disorder Identification Test Final Score (AUDIT) 0      GAD-7    Flowsheet Row Video Visit from 04/10/2022 in [facility] Video Visit from 11/10/2021 in [facility] Video Visit from 09/01/2021 in [facility] Video Visit from 07/04/2021 in [facility] Video Visit from 05/03/2021 in [facility]  Total GAD-7 Score 6 7 13 7 7       PHQ2-9    Flowsheet Row Video Visit from 04/10/2022 in [facility] Video Visit from 11/10/2021 in [facility] Video Visit from 09/01/2021 in [facility] Video Visit from 07/04/2021 in [facility] Video Visit from 05/03/2021 in [facility]  PHQ-2 Total Score 1 0 3 1 0  PHQ-9 Total Score -- -- 15 -- --      Flowsheet Row Video Visit from 04/10/2022 in [facility] ED from  03/21/2022 in [facility] ED from 12/03/2021 in [facility]  Day No Risk No Risk No Risk        Assessment and Plan:   Lisa Herrera is a 27 year old, Hispanic female with a past psychiatric history significant for PTSD, major depressive disorder, and generalized anxiety disorder who presents to [facility] via virtual video visit for follow-up and medication  management. Spanish interpretive services were utilized during the duration of the encounter via telephone.  Patient reports that she has not been taking her medications since Saturday due to the pharmacy not refilling her medications due to not having a prescription.  Although patient has not been taking her medications since Saturday, she denies depression and endorses minimal anxiety.  Patient would like to continue taking her previously established psychiatric medications.  Patient's medications to be e-prescribed to pharmacy of choice.  The conclusion of the encounter, patient requests provider to fill out forms regarding her bariatric surgery.  Patient informed provider to call her psychologist Merri Ray, [phone]) if there were any questions that needed to be addressed.  Provider to reach out to psychologist for if clarification is needed during filling out patient's forms.  Collaboration of Care: Collaboration of Care: Medication Management AEB provider managing patient's psychiatric medications, Primary Care Provider AEB patient being followed by her primary care provider, Psychiatrist AEB patient being followed by mental health provider, and Other provider involved in patient's care AEB patient being followed by bariatrics  Patient/Guardian was advised Release of Information must be obtained prior to any record release in order to collaborate their care with an outside provider. Patient/Guardian was advised if they  have not already done so to contact the registration department to sign all necessary forms in order for [place] to release information regarding their care.   Consent: Patient/Guardian gives verbal consent for treatment and assignment of benefits for services provided during this visit. Patient/Guardian expressed understanding and agreed to proceed.   1. Generalized anxiety disorder  - busPIRone (BUSPAR) 7.5 MG tablet; Take 1 tablet (7.5 mg total) by mouth 2 (two) times daily.  Dispense: 60 tablet; Refill: 2 - sertraline (ZOLOFT) 50 MG tablet; Take 1 tablet (50 mg total) by mouth daily. TAKE 1 TABLET(50 MG) BY MOUTH DAILY  Dispense: 90 tablet; Refill: 2  2. PTSD (post-traumatic stress disorder)  - sertraline (ZOLOFT) 50 MG tablet; Take 1 tablet (50 mg total) by mouth daily. TAKE 1 TABLET(50 MG) BY MOUTH DAILY  Dispense: 90 tablet; Refill: 2  3. Mild episode of recurrent major depressive disorder (HCC)  - sertraline (ZOLOFT) 50 MG tablet; Take 1 tablet (50 mg total) by mouth daily. TAKE 1 TABLET(50 MG) BY MOUTH DAILY  Dispense: 90 tablet; Refill: 2  Patient to follow-up in 2 months Provider spent a total of 30 minutes with the patient/reviewing patient's chart  Malachy Mood, PA 04/10/2022, 7:00 PM

## 2022-04-11 NOTE — Addendum Note (Signed)
Addended by: Malachy Mood on: 04/11/2022 01:38 PM   Modules accepted: Level of Service

## 2022-05-07 NOTE — Therapy (Incomplete)
OUTPATIENT PHYSICAL THERAPY THORACOLUMBAR EVALUATION   Patient Name: Lisa Herrera MRN: [id number] DOB:1995/05/18,27 y.o., female Today's Date: 05/07/2022   END OF SESSION:    Past Medical History:  Diagnosis Date   Alpha thalassemia silent carrier 05/29/2019   Gallstones    Hepatic steatosis 02/04/2020   Migraine    Umbilical hernia without obstruction and without gangrene 02/04/2020   Past Surgical History:  Procedure Laterality Date   CESAREAN SECTION N/A 01/14/2016   Procedure: CESAREAN SECTION;  Surgeon: Osborne Oman, MD;  Location: [place];  Service: Obstetrics;  Laterality: N/A;   CESAREAN SECTION N/A 11/17/2019   Procedure: CESAREAN SECTION;  Surgeon: Truett Mainland, DO;  Location: [place] [facility];  Service: Obstetrics;  Laterality: N/A;   CHOLECYSTECTOMY     TUBAL LIGATION Bilateral 11/17/2019   Procedure: BILATERAL TUBAL LIGATION;  Surgeon: Truett Mainland, DO;  Location: [place] [facility];  Service: Obstetrics;  Laterality: Bilateral;   Patient Active Problem List   Diagnosis Date Noted   PTSD (post-traumatic stress disorder) 07/15/2020   Mild episode of recurrent major depressive disorder ([place]) 07/15/2020   Generalized anxiety disorder 07/15/2020   MDD (major depressive episode), single episode, severe, no psychosis ([place]) 05/27/2020   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   Panic disorder 05/27/2020   MDD (major depressive disorder), recurrent episode, moderate ([place])    Suicidal ideation    Diarrhea 02/22/2020   Mucus in stool 02/22/2020   Left sided abdominal pain 02/22/2020   Hepatic steatosis [id number]   Umbilical hernia without obstruction and without gangrene 02/04/2020   Postpartum care following cesarean delivery 12/11/2019   Cesarean delivery delivered 11/17/2019   History of bilateral tubal ligation 11/17/2019   Axillary pain, left 11/04/2019   Sleep apnea 10/28/2019   BMI 50.0-59.9, adult ([place]) 10/28/2019   Language barrier 09/01/2019    Gastroesophageal reflux disease with esophagitis without hemorrhage 05/12/2019   Obesity affecting pregnancy in third trimester, antepartum 05/12/2019   Hx of cholelithiasis 05/12/2019   Supervision of other normal pregnancy, antepartum 04/08/2019   S/P cesarean section for arrest of descent 01/13/2016   Maternal varicella, non-immune 01/12/2016    PCP: Lennie Odor, PA  REFERRING PROVIDER: Lennie Odor, PA  REFERRING DIAG: low back pain, unspecified   Rationale for Evaluation and Treatment: Rehabilitation  THERAPY DIAG:  No diagnosis found.  ONSET DATE: acute on chronic   SUBJECTIVE:                                                                                                                                                                                           SUBJECTIVE STATEMENT: ***  PERTINENT HISTORY:  History of C-section x 2   PAIN:  Are you having pain? {OPRCPAIN:27236}  PRECAUTIONS: {Therapy precautions:24002}  WEIGHT BEARING RESTRICTIONS: No  FALLS:  Has patient fallen in last 6 months? {fallsyesno:27318}  LIVING ENVIRONMENT: Lives with: {[facility] lives with:25569::"lives with their family"} Lives in: {Lives in:25570} Stairs: {opstairs:27293} Has following equipment at home: {Assistive devices:23999}  OCCUPATION: ***  PLOF: {PLOF:24004}  PATIENT GOALS: ***   OBJECTIVE:   DIAGNOSTIC FINDINGS:  Lumbar X-ray:  FINDINGS: There are 5 non-rib-bearing lumbar vertebra. Broad-based levo scoliotic curvature of the lower thoracic and lumbar spine. No listhesis. Vertebral body heights are normal. No evidence of acute fracture. No significant disc space narrowing. No evidence of focal bone abnormality or pars defects. The posterior elements appear intact. Sacroiliac joints are congruent, question sacroiliac sclerosis not well assessed on the current exam.   IMPRESSION: 1. No acute fracture of the lumbar spine. 2. Mild broad-based scoliosis. 3.  Question of bilateral sacroiliac sclerosis, not well assessed on the current exam. This may represent sacroiliitis in the appropriate clinical setting.  PATIENT SURVEYS:  Modified Oswestry ***   SCREENING FOR RED FLAGS: Bowel or bladder incontinence: {Yes/No:304960894} Spinal tumors: {Yes/No:304960894} Cauda equina syndrome: {Yes/No:304960894} Compression fracture: {Yes/No:304960894} Abdominal aneurysm: {Yes/No:304960894}  COGNITION: Overall cognitive status: {cognition:24006}     SENSATION: {sensation:27233}  MUSCLE LENGTH: Hamstrings: Right *** deg; Left *** deg Thomas test: Right *** deg; Left *** deg  POSTURE: {posture:25561}  PALPATION: ***  LUMBAR ROM:   AROM eval  Flexion   Extension   Right lateral flexion   Left lateral flexion   Right rotation   Left rotation    (Blank rows = not tested)  LOWER EXTREMITY ROM:     Active  Right eval Left eval  Hip flexion    Hip extension    Hip abduction    Hip adduction    Hip internal rotation    Hip external rotation    Knee flexion    Knee extension    Ankle dorsiflexion    Ankle plantarflexion    Ankle inversion    Ankle eversion     (Blank rows = not tested)  LOWER EXTREMITY MMT:    MMT Right eval Left eval  Hip flexion    Hip extension    Hip abduction    Hip adduction    Hip internal rotation    Hip external rotation    Knee flexion    Knee extension    Ankle dorsiflexion    Ankle plantarflexion    Ankle inversion    Ankle eversion     (Blank rows = not tested)  LUMBAR SPECIAL TESTS:  ***  FUNCTIONAL TESTS:  {Functional tests:24029}  GAIT: Distance walked: *** Assistive device utilized: {Assistive devices:23999} Level of assistance: {Levels of assistance:24026} Comments: ***  [facility] Adult PT Treatment:                                                DATE: 05/08/22  Therapeutic Exercise: Demonstrated and issued initial HEP.   Manual Therapy: *** Neuromuscular  re-ed: *** Therapeutic Activity: Education on assessment findings that will be addressed throughout duration of POC.   Modalities: *** Self Care: ***    PATIENT EDUCATION:  Education details: see treatment Person educated: Patient Education method: Explanation, Demonstration, Tactile cues, Verbal cues, and Handouts Education comprehension: verbalized  understanding, returned demonstration, verbal cues required, tactile cues required, and needs further education  HOME EXERCISE PROGRAM: ***  ASSESSMENT:  CLINICAL IMPRESSION: Patient is a 26 y.o. female who was seen today for physical therapy evaluation and treatment for acute on chronic low back pain that was worsened when she went to lift a case of water in January***.   OBJECTIVE IMPAIRMENTS: {opptimpairments:25111}.   ACTIVITY LIMITATIONS: {activitylimitations:27494}  PARTICIPATION LIMITATIONS: {participationrestrictions:25113}  PERSONAL FACTORS: {Personal factors:25162} are also affecting patient's functional outcome.   REHAB POTENTIAL: {rehabpotential:25112}  CLINICAL DECISION MAKING: {clinical decision making:25114}  EVALUATION COMPLEXITY: {Evaluation complexity:25115}   GOALS: Goals reviewed with patient? Yes  SHORT TERM GOALS: Target date: ***  Patient will be independent and compliant with initial HEP.   Baseline: see above Goal status: INITIAL  2.  *** Baseline: see above  Goal status: INITIAL  3.  *** Baseline: see above  Goal status: INITIAL  4.  *** Baseline: see above  Goal status: INITIAL  LONG TERM GOALS: Target date: ***  *** Baseline: see above Goal status: INITIAL  2.  *** Baseline: see above Goal status: INITIAL  3.  *** Baseline: see above Goal status: INITIAL  4.  *** Baseline: see above Goal status: INITIAL  5.  *** Baseline: see above  Goal status: INITIAL  6.  *** Baseline:  Goal status: INITIAL  PLAN:  PT FREQUENCY: {rehab frequency:25116}  PT  DURATION: {rehab duration:25117}  PLANNED INTERVENTIONS: {rehab planned interventions:25118::"Therapeutic exercises","Therapeutic activity","Neuromuscular re-education","Balance training","Gait training","Patient/Family education","Self Care","Joint mobilization"}.  PLAN FOR NEXT SESSION: review and progress HEP prn; ***  Gwendolyn Grant, PT, DPT, ATC 05/07/22 1:31 PM

## 2022-05-08 ENCOUNTER — Ambulatory Visit: Payer: Medicaid Other | Attending: Physician Assistant

## 2022-06-07 ENCOUNTER — Ambulatory Visit: Payer: Medicaid Other | Attending: Physical Therapy | Admitting: Physical Therapy

## 2022-09-14 ENCOUNTER — Telehealth (HOSPITAL_COMMUNITY): Payer: Self-pay | Admitting: *Deleted

## 2022-09-14 NOTE — Telephone Encounter (Signed)
Patient called, she is having surgery on Monday and was told she is not to take any medication after surgery. She is wondering about her psychiatric medications. Her surgeon told her to call her psychiatrist to discuss. She is asking if we can get a spanish interpreter for the return call.

## 2022-09-19 ENCOUNTER — Encounter (HOSPITAL_COMMUNITY): Payer: Self-pay | Admitting: Physician Assistant

## 2022-09-19 ENCOUNTER — Encounter (HOSPITAL_COMMUNITY): Payer: Self-pay

## 2022-09-19 ENCOUNTER — Telehealth (INDEPENDENT_AMBULATORY_CARE_PROVIDER_SITE_OTHER): Payer: Medicaid Other | Admitting: Physician Assistant

## 2022-09-19 DIAGNOSIS — F330 Major depressive disorder, recurrent, mild: Secondary | ICD-10-CM | POA: Diagnosis not present

## 2022-09-19 DIAGNOSIS — F4310 Post-traumatic stress disorder, unspecified: Secondary | ICD-10-CM | POA: Diagnosis not present

## 2022-09-19 DIAGNOSIS — F411 Generalized anxiety disorder: Secondary | ICD-10-CM | POA: Diagnosis not present

## 2022-09-19 MED ORDER — SERTRALINE HCL 50 MG PO TABS: 50.0000 mg | ORAL_TABLET | Freq: Every day | ORAL | 2 refills | Status: DC

## 2022-09-19 MED ORDER — BUSPIRONE HCL 7.5 MG PO TABS: 7.5000 mg | ORAL_TABLET | Freq: Two times a day (BID) | ORAL | 2 refills | Status: DC

## 2022-09-19 NOTE — Progress Notes (Signed)
BH MD/PA/NP OP Progress Note  Virtual Visit via Video Note  I connected with Lisa Herrera on 09/19/22 at  4:30 PM EDT by a video enabled telemedicine application and verified that I am speaking with the correct person using two identifiers.  Location: Patient: Home Provider: Clinic   I discussed the limitations of evaluation and management by telemedicine and the availability of in person appointments. The patient expressed understanding and agreed to proceed.  Follow Up Instructions:   I discussed the assessment and treatment plan with the patient. The patient was provided an opportunity to ask questions and all were answered. The patient agreed with the plan and demonstrated an understanding of the instructions.   The patient was advised to call back or seek an in-person evaluation if the symptoms worsen or if the condition fails to improve as anticipated.  I provided 28 minutes of non-face-to-face time during this encounter.  Meta Hatchet, PA    09/19/2022 8:10 PM Lisa Herrera  MRN:  [id number]  Chief Complaint:  Chief Complaint  Patient presents with   Follow-up   Medication Refill   HPI:   Lisa Herrera is a 27 year old, Hispanic female with a past psychiatric history significant for generalized anxiety disorder, PTSD, and major depressive disorder who presents to [facility] via virtual video visit for follow-up and medication management.  Interpretive services were utilized during the duration of the encounter.  Patient was last seen by this provider on 04/10/2022.  During her last encounter, patient was being managed on the following psychiatric medications:  Sertraline (Zoloft) 50 mg daily Buspirone 7.5 mg 2 times daily  Patient reports that she is doing well and describes her mood as so-so.  Patient recently had bariatric surgery performed and states that she was taking her medications regularly prior to undergoing surgery.   Patient states that she has not taken her psychiatric medications since Sunday.  Patient continues to endorse anxiety but is unsure if her anxiety is due to recently undergoing surgery.  Patient rates her anxiety an 8 out of 10.  Patient's main stressor involves the new diet that she must abide by since having her surgery performed.  Patient denies overt depression symptoms at this time.  A PHQ-9 screen was performed with the patient scoring a 12.  A GAD-7 screen was also performed the patient scoring an 10.  Provider was able to locate documents regarding patient's recent surgery.  Per surgery note regarding emotional changes postoperation (09/17/2022):  You might feel sad or "blue," depressed, angry, and even fearful after your surgery. These feelings are normal as you recover and adapt to lifestyle changes related to your diet, exercise, and body image. This "emotional roller coaster" usually decreases as you feel more energetic, adapt to your diet changes, and are able to resume normal routines like driving or working. If you take medicines for an anxiety disorder, please resume the medicines as soon as you go home or when you start eating a pureed diet. Things to help you feel better about yourself include:  Take a walk. Exercise elevates mood by releasing chemicals in the brain.  Eat adequate amounts of protein (eggs, beans, milk, yogurt, seafood, white meat poultry, lean meat, etc.)  Take a drive to get out of the house.  Please do not struggle with your emotions alone. Your therapist, psychiatrist, or our behavioral team may assist during your recovery.   **Per surgery note, patient can start taking psychiatric medications due to  medications being imperative in the management of her anxiety and depressive symptoms.**  Patient is alert and oriented x 4, calm, cooperative, and fully engaged in conversation during the encounter.  Patient describes her mood as mad, irritable, and anxious.  Patient  denies suicidal or homicidal ideations.  She further denies auditory or visual hallucinations and does not appear to be responding to internal/external stimuli.  Patient endorses good sleep and receives on average 7 to 8 hours of sleep each night.  Patient endorses fair appetite and is currently on a liquid diet postsurgery.  Patient denies alcohol consumption, tobacco use, or illicit drug  Visit Diagnosis:    ICD-10-CM   1. Generalized anxiety disorder  F41.1 busPIRone (BUSPAR) 7.5 MG tablet    sertraline (ZOLOFT) 50 MG tablet    2. PTSD (post-traumatic stress disorder)  F43.10 sertraline (ZOLOFT) 50 MG tablet    3. Mild episode of recurrent major depressive disorder (HCC)  F33.0 sertraline (ZOLOFT) 50 MG tablet      Past Psychiatric History:  Major depressive disorder Generalized anxiety disorder PTSD  Past Medical History:  Past Medical History:  Diagnosis Date   Alpha thalassemia silent carrier 05/29/2019   Gallstones    Hepatic steatosis 02/04/2020   Migraine    Umbilical hernia without obstruction and without gangrene 02/04/2020    Past Surgical History:  Procedure Laterality Date   CESAREAN SECTION N/A 01/14/2016   Procedure: CESAREAN SECTION;  Surgeon: Tereso Newcomer, MD;  [facility];  Service: Obstetrics;  Laterality: N/A;   CESAREAN SECTION N/A 11/17/2019   Procedure: CESAREAN SECTION;  Surgeon: Levie Heritage, DO;  Location: [facility];  Service: Obstetrics;  Laterality: N/A;   CHOLECYSTECTOMY     TUBAL LIGATION Bilateral 11/17/2019   Procedure: BILATERAL TUBAL LIGATION;  Surgeon: Levie Heritage, DO;  Location: [facility];  Service: Obstetrics;  Laterality: Bilateral;    Family Psychiatric History:  Grandfather - patient states that her grandfather may have been schizophrenic but he was never officially diagnosed   Brother - patient states that her brother has been assessed for schizophrenia but has never been diagnosed  Family History:  Family  History  Problem Relation Age of Onset   Breast cancer Maternal Aunt        50's   Colon cancer Neg Hx    Stomach cancer Neg Hx    Esophageal cancer Neg Hx     Social History:  Social History   Socioeconomic History   Marital status: Married    Spouse name: Not on file   Number of children: 1   Years of education: Not on file   Highest education level: Not on file  Occupational History   Not on file  Tobacco Use   Smoking status: Never   Smokeless tobacco: Never  Vaping Use   Vaping Use: Never used  Substance and Sexual Activity   Alcohol use: No    Alcohol/week: 0.0 standard drinks of alcohol   Drug use: No   Sexual activity: Yes    Birth control/protection: None  Other Topics Concern   Not on file  Social History Narrative   Married, originally from [place], Spanish-speaking.   Son born October 2021 and a daughter born in 2017   No alcohol no tobacco or drug use   Social Determinants of Corporate investment banker Strain: Not on file  Food Insecurity: Not on file  Transportation Needs: Not on file  Physical Activity: Not on  file  Stress: Not on file  Social Connections: Not on file    Allergies:  Allergies  Allergen Reactions   Bupropion     Anxiousness Tachycardia    Metabolic Disorder Labs: Lab Results  Component Value Date   HGBA1C 5.3 05/25/2020   MPG 105.41 05/25/2020   No results found for: "PROLACTIN" Lab Results  Component Value Date   CHOL 187 05/25/2020   TRIG 111 05/25/2020   HDL 55 05/25/2020   CHOLHDL 3.4 05/25/2020   VLDL 22 05/25/2020   LDLCALC 110 (H) 05/25/2020   Lab Results  Component Value Date   TSH 1.836 05/25/2020    Therapeutic Level Labs: No results found for: "LITHIUM" No results found for: "VALPROATE" No results found for: "CBMZ"  Current Medications: Current Outpatient Medications  Medication Sig Dispense Refill   busPIRone (BUSPAR) 7.5 MG tablet Take 1 tablet (7.5 mg total) by mouth 2 (two) times daily.  60 tablet 2   hydrOXYzine (ATARAX/VISTARIL) 25 MG tablet Take 1 tablet (25 mg total) by mouth every 6 (six) hours as needed for anxiety. 75 tablet 0   omeprazole (PRILOSEC) 40 MG capsule Take 1 capsule (40 mg total) by mouth daily before breakfast. 30 capsule 2   oseltamivir (TAMIFLU) 75 MG capsule Take 1 capsule (75 mg total) by mouth every 12 (twelve) hours. 10 capsule 0   Prenatal Vit-Fe Fumarate-FA (PRENATAL MULTIVITAMIN) TABS tablet Take 1 tablet by mouth daily.      sertraline (ZOLOFT) 50 MG tablet Take 1 tablet (50 mg total) by mouth daily. TAKE 1 TABLET(50 MG) BY MOUTH DAILY 90 tablet 2   traZODone (DESYREL) 50 MG tablet Take 1 tablet (50 mg total) by mouth at bedtime. For sleep 30 tablet 0   No current facility-administered medications for this visit.     Musculoskeletal: Strength & Muscle Tone: within normal limits Gait & Station: normal Patient leans: N/A  Psychiatric Specialty Exam: Review of Systems  Psychiatric/Behavioral:  Negative for decreased concentration, dysphoric mood, hallucinations, self-injury, sleep disturbance and suicidal ideas. The patient is nervous/anxious. The patient is not hyperactive.     There were no vitals taken for this visit.There is no height or weight on file to calculate BMI.  General Appearance: Casual  Eye Contact:  Good  Speech:  Clear and Coherent and Normal Rate  Volume:  Normal  Mood:  Anxious and Depressed  Affect:  Congruent  Thought Process:  Coherent, Goal Directed, and Descriptions of Associations: Intact  Orientation:  Full (Time, Place, and Person)  Thought Content: WDL   Suicidal Thoughts:  No  Homicidal Thoughts:  No  Memory:  Immediate;   Good Recent;   Good Remote;   Good  Judgement:  Good  Insight:  Good  Psychomotor Activity:  Normal  Concentration:  Concentration: Good and Attention Span: Good  Recall:  Good  Fund of Knowledge: Good  Language: Good  Akathisia:  No  Handed:  Right  AIMS (if indicated): not done   Assets:  Communication Skills Desire for Improvement Housing Intimacy Social Support Vocational/Educational  ADL's:  Intact  Cognition: WNL  Sleep:  Good   Screenings: AIMS    Flowsheet Row [facility]  AIMS Total Score 0      AUDIT    Flowsheet Row [facility]  Alcohol Use Disorder Identification Test Final Score (AUDIT) 0      GAD-7  Flowsheet Row Video Visit from 09/19/2022 in [facility] Video Visit from 04/10/2022 in [facility] Video Visit from 11/10/2021 in [facility] Video Visit from 09/01/2021 in [facility] Video Visit from 07/04/2021 in [facility]  Total GAD-7 Score 10 6 7 13 7       PHQ2-9    Flowsheet Row Video Visit from 09/19/2022 in [facility] Video Visit from 04/10/2022 in [facility] Video Visit from 11/10/2021 in [facility] Video Visit from 09/01/2021 in [facility] Video Visit from 07/04/2021 in [facility]  PHQ-2 Total Score 3 1 0 3 1  PHQ-9 Total Score 12 -- -- 15 --      Flowsheet Row Video Visit from 09/19/2022 in [facility] Video Visit from 04/10/2022 in [facility] ED from 03/21/2022 in [facility] Emergency Department at [facility]  C-SSRS RISK CATEGORY No Risk No Risk No Risk        Assessment and Plan:   Lisa Herrera is a 27 year old, Hispanic female with a past psychiatric history significant for generalized anxiety disorder, PTSD, and major depressive disorder who presents to [facility] via virtual video  visit for follow-up and medication management.  Interpretive services were utilized during the duration of the encounter.  Patient recently underwent bariatric surgery on 09/17/2022.  Patient states that she has been taking her medications since Sunday, but states that prior to her surgery, she was taking her medications regularly.  Since last taking her medications, patient describes her mood as anxious and nervous.  She denies overt depressive symptoms at this time but did score a 12 on her PHQ-9 screen.  Per patient's surgery note, patient is allowed to take medications if they are for the management of her anxiety.  Provider instructed patient to try taking her medications with her liquid diet.  Patient vocalized understanding.  Patient was instructed to contact provider if she runs to any issues while taking her medications.  Patient's medications to be e-prescribed to pharmacy of choice.  Collaboration of Care: Collaboration of Care: Medication Management AEB provider managing patient's psychiatric medications, Primary Care Provider AEB patient being seen by primary care provider, Psychiatrist AEB patient being followed by mental health provider, and Other provider involved in patient's care AEB patient being followed by bariatrics  Patient/Guardian was advised Release of Information must be obtained prior to any record release in order to collaborate their care with an outside provider. Patient/Guardian was advised if they have not already done so to contact the registration department to sign all necessary forms in order for [place] to release information regarding their care.   Consent: Patient/Guardian gives verbal consent for treatment and assignment of benefits for services provided during this visit. Patient/Guardian expressed understanding and agreed to proceed.   1. Generalized anxiety disorder  - busPIRone (BUSPAR) 7.5 MG tablet; Take 1 tablet (7.5 mg total) by mouth 2 (two) times daily.   Dispense: 60 tablet; Refill: 2 - sertraline (ZOLOFT) 50 MG tablet; Take 1 tablet (50 mg total) by mouth daily. TAKE 1 TABLET(50 MG) BY MOUTH DAILY  Dispense: 90 tablet; Refill: 2  2. PTSD (post-traumatic stress disorder)  - sertraline (ZOLOFT) 50 MG tablet; Take 1 tablet (50 mg total) by mouth daily. TAKE 1 TABLET(50 MG) BY MOUTH DAILY  Dispense: 90 tablet; Refill: 2  3. Mild episode of recurrent major depressive disorder (HCC)  - sertraline (ZOLOFT) 50 MG tablet; Take 1 tablet (50 mg total) by mouth daily. TAKE 1 TABLET(50 MG) BY MOUTH DAILY  Dispense: 90 tablet; Refill: 2  Patient to follow-up in 7 weeks Provider spent a total of 28 minutes with the patient/reviewing patient's chart  Meta Hatchet, PA 09/19/2022, 8:10 PM

## 2022-09-26 NOTE — Telephone Encounter (Signed)
Message acknowledged and reviewed.  Provider was able to talk to patient during her previous encounter.  Patient's next encounter with this provider scheduled for 11/07/2022.

## 2022-11-07 ENCOUNTER — Telehealth (INDEPENDENT_AMBULATORY_CARE_PROVIDER_SITE_OTHER): Payer: Medicaid Other | Admitting: Physician Assistant

## 2022-11-07 ENCOUNTER — Encounter (HOSPITAL_COMMUNITY): Payer: Self-pay | Admitting: Physician Assistant

## 2022-11-07 DIAGNOSIS — F330 Major depressive disorder, recurrent, mild: Secondary | ICD-10-CM

## 2022-11-07 DIAGNOSIS — F4310 Post-traumatic stress disorder, unspecified: Secondary | ICD-10-CM | POA: Diagnosis not present

## 2022-11-07 DIAGNOSIS — F411 Generalized anxiety disorder: Secondary | ICD-10-CM | POA: Diagnosis not present

## 2022-11-07 MED ORDER — SERTRALINE HCL 50 MG PO TABS
50.0000 mg | ORAL_TABLET | Freq: Every day | ORAL | 2 refills | Status: DC
Start: 2022-11-07 — End: 2023-01-09

## 2022-11-07 MED ORDER — BUSPIRONE HCL 7.5 MG PO TABS: 7.5000 mg | ORAL_TABLET | Freq: Two times a day (BID) | ORAL | 2 refills | Status: DC

## 2022-11-07 NOTE — Progress Notes (Signed)
BH MD/PA/NP OP Progress Note  Virtual Visit via Video Note  I connected with Lisa Herrera on 11/07/22 at  4:30 PM EDT by a video enabled telemedicine application and verified that I am speaking with the correct person using two identifiers.  Location: Patient: Home Provider: Clinic   I discussed the limitations of evaluation and management by telemedicine and the availability of in person appointments. The patient expressed understanding and agreed to proceed.  Follow Up Instructions:   I discussed the assessment and treatment plan with the patient. The patient was provided an opportunity to ask questions and all were answered. The patient agreed with the plan and demonstrated an understanding of the instructions.   The patient was advised to call back or seek an in-person evaluation if the symptoms worsen or if the condition fails to improve as anticipated.  I provided 22 minutes of non-face-to-face time during this encounter.  Meta Hatchet, PA    11/07/2022 6:46 PM Lisa Herrera  MRN:  [id number]  Chief Complaint:  Chief Complaint  Patient presents with   Follow-up   Medication Refill   HPI:   Lisa Herrera is a 27 year old, Hispanic female with a past psychiatric history significant for generalized anxiety disorder, PTSD, and major depressive disorder who presents to [facility] via virtual video visit for follow-up and medication management.  Interpretive services were utilized during the duration of the encounter.  Patient is currently being managed on the following psychiatric medications:  Sertraline (Zoloft) 50 mg daily Buspirone 7.5 mg 2 times daily  Patient presents to the encounter stating that she is fine and has been taking her medications regularly.  Patient reports no issues or concerns regarding her current medication regimen.  Patient denies experiencing adverse side effects and further denies any need for dosage  adjustments at this time.  Patient denied depressive symptoms or anxiety.  Patient further denied any stressors at this time.  A GAD-7 screen was performed with the patient scoring a 10.  Patient is alert and oriented x 4, calm, cooperative, and fully engaged in conversation during the encounter.  Patient endorses good mood since losing weight from her bariatric surgery.  Although patient endorses good mood, she endorses anxiety stating that she experiences anxiety over things that she does not have to normally think about.  Patient denies suicidal or homicidal ideations.  She further denied auditory or visual hallucinations and does not appear to be responding to internal/external stimuli.  Patient endorses good sleep and received an average of 8 hours of sleep per night.  Patient endorses a good appetite and eats on average 5 meals roughly equating to 2 ounces of food each.  Patient denies alcohol consumption, tobacco use, or illicit drug use.  Visit Diagnosis:    ICD-10-CM   1. Generalized anxiety disorder  F41.1 busPIRone (BUSPAR) 7.5 MG tablet    sertraline (ZOLOFT) 50 MG tablet    2. PTSD (post-traumatic stress disorder)  F43.10 sertraline (ZOLOFT) 50 MG tablet    3. Mild episode of recurrent major depressive disorder (HCC)  F33.0 sertraline (ZOLOFT) 50 MG tablet      Past Psychiatric History:  Major depressive disorder Generalized anxiety disorder PTSD  Past Medical History:  Past Medical History:  Diagnosis Date   Alpha thalassemia silent carrier 05/29/2019   Gallstones    Hepatic steatosis 02/04/2020   Migraine    Umbilical hernia without obstruction and without gangrene 02/04/2020    Past Surgical History:  Procedure  Laterality Date   CESAREAN SECTION N/A 01/14/2016   Procedure: CESAREAN SECTION;  Surgeon: Tereso Newcomer, MD;  [facility]: [facility];  Service: Obstetrics;  Laterality: N/A;   CESAREAN SECTION N/A 11/17/2019   Procedure: CESAREAN SECTION;  Surgeon:  Levie Heritage, DO;  Location: [facility];  Service: Obstetrics;  Laterality: N/A;   CHOLECYSTECTOMY     TUBAL LIGATION Bilateral 11/17/2019   Procedure: BILATERAL TUBAL LIGATION;  Surgeon: Levie Heritage, DO;  Location: [facility];  Service: Obstetrics;  Laterality: Bilateral;    Family Psychiatric History:  Grandfather - patient states that her grandfather may have been schizophrenic but he was never officially diagnosed   Brother - patient states that her brother has been assessed for schizophrenia but has never been diagnosed  Family History:  Family History  Problem Relation Age of Onset   Breast cancer Maternal Aunt        50's   Colon cancer Neg Hx    Stomach cancer Neg Hx    Esophageal cancer Neg Hx     Social History:  Social History   Socioeconomic History   Marital status: Married    Spouse name: Not on file   Number of children: 1   Years of education: Not on file   Highest education level: Not on file  Occupational History   Not on file  Tobacco Use   Smoking status: Never   Smokeless tobacco: Never  Vaping Use   Vaping status: Never Used  Substance and Sexual Activity   Alcohol use: No    Alcohol/week: 0.0 standard drinks of alcohol   Drug use: No   Sexual activity: Yes    Birth control/protection: None  Other Topics Concern   Not on file  Social History Narrative   Married, originally from [place], Spanish-speaking.   Son born October 2021 and a daughter born in 2017   No alcohol no tobacco or drug use   Social Determinants of Health   Financial Resource Strain: Not on file  Food Insecurity: Low Risk  (09/17/2022)   [facility]   Food vital sign    Within the past 12 months, you worried that your food would run out before you got money to buy more: Never true    Within the past 12 months, the food you bought just didn't last and you didn't have money to get more. : Never true  Transportation Needs: No  Transportation Needs (09/17/2022)   [facility]   Transportation    In the past 12 months, has lack of reliable transportation kept you from medical appointments, meetings, work or from getting things needed for daily living? : No  Physical Activity: Not on file  Stress: Not on file  Social Connections: Not on file    Allergies:  Allergies  Allergen Reactions   Bupropion     Anxiousness Tachycardia    Metabolic Disorder Labs: Lab Results  Component Value Date   HGBA1C 5.3 05/25/2020   MPG 105.41 05/25/2020   No results found for: "PROLACTIN" Lab Results  Component Value Date   CHOL 187 05/25/2020   TRIG 111 05/25/2020   HDL 55 05/25/2020   CHOLHDL 3.4 05/25/2020   VLDL 22 05/25/2020   LDLCALC 110 (H) 05/25/2020   Lab Results  Component Value Date   TSH 1.836 05/25/2020    Therapeutic Level Labs: No results found for: "LITHIUM" No results found for: "VALPROATE"  No results found for: "CBMZ"  Current Medications: Current Outpatient Medications  Medication Sig Dispense Refill   busPIRone (BUSPAR) 7.5 MG tablet Take 1 tablet (7.5 mg total) by mouth 2 (two) times daily. 180 tablet 2   hydrOXYzine (ATARAX/VISTARIL) 25 MG tablet Take 1 tablet (25 mg total) by mouth every 6 (six) hours as needed for anxiety. 75 tablet 0   omeprazole (PRILOSEC) 40 MG capsule Take 1 capsule (40 mg total) by mouth daily before breakfast. 30 capsule 2   oseltamivir (TAMIFLU) 75 MG capsule Take 1 capsule (75 mg total) by mouth every 12 (twelve) hours. 10 capsule 0   Prenatal Vit-Fe Fumarate-FA (PRENATAL MULTIVITAMIN) TABS tablet Take 1 tablet by mouth daily.      sertraline (ZOLOFT) 50 MG tablet Take 1 tablet (50 mg total) by mouth daily. TAKE 1 TABLET(50 MG) BY MOUTH DAILY 90 tablet 2   traZODone (DESYREL) 50 MG tablet Take 1 tablet (50 mg total) by mouth at bedtime. For sleep 30 tablet 0   No current facility-administered medications for this visit.      Musculoskeletal: Strength & Muscle Tone: within normal limits Gait & Station: normal Patient leans: N/A  Psychiatric Specialty Exam: Review of Systems  Psychiatric/Behavioral:  Negative for decreased concentration, dysphoric mood, hallucinations, self-injury, sleep disturbance and suicidal ideas. The patient is nervous/anxious. The patient is not hyperactive.     There were no vitals taken for this visit.There is no height or weight on file to calculate BMI.  General Appearance: Casual  Eye Contact:  Good  Speech:  Clear and Coherent and Normal Rate  Volume:  Normal  Mood:  Anxious and Depressed  Affect:  Congruent  Thought Process:  Coherent, Goal Directed, and Descriptions of Associations: Intact  Orientation:  Full (Time, Place, and Person)  Thought Content: WDL   Suicidal Thoughts:  No  Homicidal Thoughts:  No  Memory:  Immediate;   Good Recent;   Good Remote;   Good  Judgement:  Good  Insight:  Good  Psychomotor Activity:  Normal  Concentration:  Concentration: Good and Attention Span: Good  Recall:  Good  Fund of Knowledge: Good  Language: Good  Akathisia:  No  Handed:  Right  AIMS (if indicated): not done  Assets:  Communication Skills Desire for Improvement Housing Intimacy Social Support Vocational/Educational  ADL's:  Intact  Cognition: WNL  Sleep:  Good   Screenings: AIMS    Flowsheet Row [facility]  AIMS Total Score 0      AUDIT    Flowsheet Row [facility]  Alcohol Use Disorder Identification Test Final Score (AUDIT) 0      GAD-7    Flowsheet Row Video Visit from 11/07/2022 in [facility] Video Visit from 09/19/2022 in [facility] Video Visit from 04/10/2022 in [facility] Video Visit from 11/10/2021 in  [facility] Video Visit from 09/01/2021 in [facility]  Total GAD-7 Score 10 10 6 7 13       PHQ2-9    Flowsheet Row Video Visit from 11/07/2022 in [facility] Video Visit from 09/19/2022 in [facility] Video Visit from 04/10/2022 in [facility] Video Visit from 11/10/2021 in [facility] Video Visit from 09/01/2021 in [facility]  PHQ-2 Total Score 0 3 1 0 3  PHQ-9 Total Score -- 12 -- -- 15      Flowsheet Row Video Visit from 11/07/2022 in [facility] Video Visit from 09/19/2022 in [facility] Video Visit from 04/10/2022 in [facility]  C-SSRS RISK CATEGORY No Risk No Risk No Risk        Assessment and Plan:   Lisa Herrera is a 27 year old, Hispanic female with a past psychiatric history significant for generalized anxiety disorder, PTSD, and major depressive disorder who presents to [facility] via virtual video visit for follow-up and medication management.  Interpretive services were utilized during the duration of the encounter.  Patient presents to the encounter reporting no issues or concerns regarding her current medication regimen.  Patient denied experiencing any adverse side effects and further denies the need for dosage adjustments at this time.  Patient denies depressive symptoms but does endorse some anxiety.  Patient would like to continue taking her medications as prescribed.  Patient's medications to be e-prescribed to pharmacy of choice.  Collaboration of Care: Collaboration of Care: Medication Management AEB provider managing patient's psychiatric medications, Primary Care Provider AEB patient being seen by primary care provider, Psychiatrist AEB patient being  followed by mental health provider, and Other provider involved in patient's care AEB patient being followed by bariatrics  Patient/Guardian was advised Release of Information must be obtained prior to any record release in order to collaborate their care with an outside provider. Patient/Guardian was advised if they have not already done so to contact the registration department to sign all necessary forms in order for [place] to release information regarding their care.   Consent: Patient/Guardian gives verbal consent for treatment and assignment of benefits for services provided during this visit. Patient/Guardian expressed understanding and agreed to proceed.   1. Generalized anxiety disorder  - busPIRone (BUSPAR) 7.5 MG tablet; Take 1 tablet (7.5 mg total) by mouth 2 (two) times daily.  Dispense: 180 tablet; Refill: 2 - sertraline (ZOLOFT) 50 MG tablet; Take 1 tablet (50 mg total) by mouth daily. TAKE 1 TABLET(50 MG) BY MOUTH DAILY  Dispense: 90 tablet; Refill: 2  2. PTSD (post-traumatic stress disorder)  - sertraline (ZOLOFT) 50 MG tablet; Take 1 tablet (50 mg total) by mouth daily. TAKE 1 TABLET(50 MG) BY MOUTH DAILY  Dispense: 90 tablet; Refill: 2  3. Mild episode of recurrent major depressive disorder (HCC)  - sertraline (ZOLOFT) 50 MG tablet; Take 1 tablet (50 mg total) by mouth daily. TAKE 1 TABLET(50 MG) BY MOUTH DAILY  Dispense: 90 tablet; Refill: 2  Patient to follow-up in 2 months Provider spent a total of 22 minutes with the patient/reviewing patient's chart  Meta Hatchet, PA 11/07/2022, 6:46 PM

## 2022-11-20 NOTE — Progress Notes (Signed)
This encounter was created in error - please disregard.

## 2023-01-09 ENCOUNTER — Encounter (HOSPITAL_COMMUNITY): Payer: Self-pay | Admitting: Physician Assistant

## 2023-01-09 ENCOUNTER — Telehealth (INDEPENDENT_AMBULATORY_CARE_PROVIDER_SITE_OTHER): Payer: Self-pay | Admitting: Physician Assistant

## 2023-01-09 DIAGNOSIS — F411 Generalized anxiety disorder: Secondary | ICD-10-CM

## 2023-01-09 DIAGNOSIS — F330 Major depressive disorder, recurrent, mild: Secondary | ICD-10-CM

## 2023-01-09 DIAGNOSIS — F4310 Post-traumatic stress disorder, unspecified: Secondary | ICD-10-CM

## 2023-01-09 MED ORDER — BUSPIRONE HCL 7.5 MG PO TABS
7.5000 mg | ORAL_TABLET | Freq: Two times a day (BID) | ORAL | 0 refills | Status: DC
Start: 2023-01-09 — End: 2023-04-10

## 2023-01-09 MED ORDER — SERTRALINE HCL 100 MG PO TABS
100.0000 mg | ORAL_TABLET | Freq: Every day | ORAL | 0 refills | Status: DC
Start: 2023-01-09 — End: 2023-04-10

## 2023-01-09 NOTE — Progress Notes (Signed)
BH MD/PA/NP OP Progress Note  Virtual Visit via Video Note  I connected with Lisa Herrera on 01/09/23 at  4:30 PM EDT by a video enabled telemedicine application and verified that I am speaking with the correct person using two identifiers.  Location: Patient: Home Provider: Clinic   I discussed the limitations of evaluation and management by telemedicine and the availability of in person appointments. The patient expressed understanding and agreed to proceed.  Follow Up Instructions:   I discussed the assessment and treatment plan with the patient. The patient was provided an opportunity to ask questions and all were answered. The patient agreed with the plan and demonstrated an understanding of the instructions.   The patient was advised to call back or seek an in-person evaluation if the symptoms worsen or if the condition fails to improve as anticipated.  I provided 32 minutes of non-face-to-face time during this encounter.  Meta Hatchet, PA    01/09/2023 6:22 PM Lisa Herrera  MRN:  [id number]  Chief Complaint:  No chief complaint on file.  HPI:   Lisa Herrera is a 27 year old, Hispanic female with a past psychiatric history significant for generalized anxiety disorder, PTSD, and major depressive disorder who presents to [facility] via virtual video visit for follow-up and medication management.  Interpretive services were utilized during the duration of the encounter.  Patient is currently being managed on the following psychiatric medications:  Sertraline (Zoloft) 50 mg daily Buspirone 7.5 mg 2 times daily  Patient presents to the encounter stating that she has been feeling well and has been taking her medications regularly.  Patient reports that 3 weeks ago, she was dealing with elevated stress, depression, and anxiety and decided to increase her medication without consulting the provider.  She reports that she increased her  sertraline dosage from 50 mg to 100 mg daily.  She reports that her depression improved from her dosage adjustment.  She continues to endorse depression and rates her depression as 6 out of 10 with 10 being most severe.  Patient endorses depressive episodes 1 day out of the week.  Patient endorses the following depressive symptoms: desperation and excessive worrying.  Patient endorses anxiety and rates her anxiety as 7 out of 10.  Patient's main stressor revolves around her family's health related issues.  A PHQ-9 screen was performed the patient scoring an 8.  A GAD-7 screen was also performed with the patient scoring a 10.  Patient is alert and oriented x 4, calm, cooperative, and fully engaged in conversation during the encounter.  Patient endorses anxiety and fatigue.  Patient denies suicidal or homicidal ideations.  She further denies auditory or visual hallucinations and does not appear to be responding to internal/external stimuli.  Patient endorses fair sleep and receives on average 5 to 6 hours of sleep per night.  Patient endorses fair appetite and eats on average 2 meals per day.  Patient denies alcohol consumption, tobacco use, or illicit drug use.  Visit Diagnosis:    ICD-10-CM   1. Generalized anxiety disorder  F41.1 busPIRone (BUSPAR) 7.5 MG tablet    sertraline (ZOLOFT) 100 MG tablet    2. PTSD (post-traumatic stress disorder)  F43.10 sertraline (ZOLOFT) 100 MG tablet    3. Mild episode of recurrent major depressive disorder (HCC)  F33.0 sertraline (ZOLOFT) 100 MG tablet      Past Psychiatric History:  Major depressive disorder Generalized anxiety disorder PTSD  Past Medical History:  Past Medical History:  Diagnosis Date   Alpha thalassemia silent carrier 05/29/2019   Gallstones    Hepatic steatosis 02/04/2020   Migraine    Umbilical hernia without obstruction and without gangrene 02/04/2020    Past Surgical History:  Procedure Laterality Date   CESAREAN SECTION N/A  01/14/2016   Procedure: CESAREAN SECTION;  Surgeon: Tereso Newcomer, MD;  Location: [facility];  Service: Obstetrics;  Laterality: N/A;   CESAREAN SECTION N/A 11/17/2019   Procedure: CESAREAN SECTION;  Surgeon: Levie Heritage, DO;  Location: [facility];  Service: Obstetrics;  Laterality: N/A;   CHOLECYSTECTOMY     TUBAL LIGATION Bilateral 11/17/2019   Procedure: BILATERAL TUBAL LIGATION;  Surgeon: Levie Heritage, DO;  Location: [facility];  Service: Obstetrics;  Laterality: Bilateral;    Family Psychiatric History:  Grandfather - patient states that her grandfather may have been schizophrenic but he was never officially diagnosed   Brother - patient states that her brother has been assessed for schizophrenia but has never been diagnosed  Family History:  Family History  Problem Relation Age of Onset   Breast cancer Maternal Aunt        50's   Colon cancer Neg Hx    Stomach cancer Neg Hx    Esophageal cancer Neg Hx     Social History:  Social History   Socioeconomic History   Marital status: Married    Spouse name: Not on file   Number of children: 1   Years of education: Not on file   Highest education level: Not on file  Occupational History   Not on file  Tobacco Use   Smoking status: Never   Smokeless tobacco: Never  Vaping Use   Vaping status: Never Used  Substance and Sexual Activity   Alcohol use: No    Alcohol/week: 0.0 standard drinks of alcohol   Drug use: No   Sexual activity: Yes    Birth control/protection: None  Other Topics Concern   Not on file  Social History Narrative   Married, originally from [place], Spanish-speaking.   Son born October 2021 and a daughter born in 2017   No alcohol no tobacco or drug use   Social Determinants of Health   Financial Resource Strain: Not on file  Food Insecurity: Low Risk  (09/17/2022)   [facility]   Hunger Vital Sign    Worried About Running Out of Food in the Last  Year: Never true    Ran Out of Food in the Last Year: Never true  Transportation Needs: No Transportation Needs (09/17/2022)   [facility]   Transportation    In the past 12 months, has lack of reliable transportation kept you from medical appointments, meetings, work or from getting things needed for daily living? : No  Physical Activity: Not on file  Stress: Not on file  Social Connections: Not on file    Allergies:  Allergies  Allergen Reactions   Bupropion     Anxiousness Tachycardia    Metabolic Disorder Labs: Lab Results  Component Value Date   HGBA1C 5.3 05/25/2020   MPG 105.41 05/25/2020   No results found for: "PROLACTIN" Lab Results  Component Value Date   CHOL 187 05/25/2020   TRIG 111 05/25/2020   HDL 55 05/25/2020   CHOLHDL 3.4 05/25/2020   VLDL 22 05/25/2020   LDLCALC 110 (H) 05/25/2020   Lab Results  Component Value Date   TSH 1.836  05/25/2020    Therapeutic Level Labs: No results found for: "LITHIUM" No results found for: "VALPROATE" No results found for: "CBMZ"  Current Medications: Current Outpatient Medications  Medication Sig Dispense Refill   busPIRone (BUSPAR) 7.5 MG tablet Take 1 tablet (7.5 mg total) by mouth 2 (two) times daily. 180 tablet 0   hydrOXYzine (ATARAX/VISTARIL) 25 MG tablet Take 1 tablet (25 mg total) by mouth every 6 (six) hours as needed for anxiety. 75 tablet 0   omeprazole (PRILOSEC) 40 MG capsule Take 1 capsule (40 mg total) by mouth daily before breakfast. 30 capsule 2   oseltamivir (TAMIFLU) 75 MG capsule Take 1 capsule (75 mg total) by mouth every 12 (twelve) hours. 10 capsule 0   Prenatal Vit-Fe Fumarate-FA (PRENATAL MULTIVITAMIN) TABS tablet Take 1 tablet by mouth daily.      sertraline (ZOLOFT) 100 MG tablet Take 1 tablet (100 mg total) by mouth daily. TAKE 1 TABLET(50 MG) BY MOUTH DAILY 90 tablet 0   traZODone (DESYREL) 50 MG tablet Take 1 tablet (50 mg total) by mouth at bedtime. For  sleep 30 tablet 0   No current facility-administered medications for this visit.     Musculoskeletal: Strength & Muscle Tone: within normal limits Gait & Station: normal Patient leans: N/A  Psychiatric Specialty Exam: Review of Systems  Psychiatric/Behavioral:  Positive for sleep disturbance. Negative for decreased concentration, dysphoric mood, hallucinations, self-injury and suicidal ideas. The patient is nervous/anxious. The patient is not hyperactive.     There were no vitals taken for this visit.There is no height or weight on file to calculate BMI.  General Appearance: Casual  Eye Contact:  Good  Speech:  Clear and Coherent and Normal Rate  Volume:  Normal  Mood:  Anxious and Depressed  Affect:  Appropriate  Thought Process:  Coherent, Goal Directed, and Descriptions of Associations: Intact  Orientation:  Full (Time, Place, and Person)  Thought Content: WDL   Suicidal Thoughts:  No  Homicidal Thoughts:  No  Memory:  Immediate;   Good Recent;   Good Remote;   Good  Judgement:  Good  Insight:  Good  Psychomotor Activity:  Normal  Concentration:  Concentration: Good and Attention Span: Good  Recall:  Good  Fund of Knowledge: Good  Language: Good  Akathisia:  No  Handed:  Right  AIMS (if indicated): not done  Assets:  Communication Skills Desire for Improvement Housing Intimacy Social Support Vocational/Educational  ADL's:  Intact  Cognition: WNL  Sleep:  Fair   Screenings: AIMS    Flowsheet Row [facility]  AIMS Total Score 0      AUDIT    Flowsheet Row [facility]  Alcohol Use Disorder Identification Test Final Score (AUDIT) 0      GAD-7    Flowsheet Row Video Visit from 01/09/2023 in [facility] Video Visit from 11/07/2022 in [facility] Video  Visit from 09/19/2022 in [facility] Video Visit from 04/10/2022 in [facility] Video Visit from 11/10/2021 in [facility]  Total GAD-7 Score 10 10 10 6 7       PHQ2-9    Flowsheet Row Video Visit from 01/09/2023 in [facility] Video Visit from 11/07/2022 in [facility] Video Visit from 09/19/2022 in [facility] Video Visit  from 04/10/2022 in [facility] Video Visit from 11/10/2021 in [facility]  PHQ-2 Total Score 3 0 3 1 0  PHQ-9 Total Score 8 -- 12 -- --      Flowsheet Row Video Visit from 01/09/2023 in [facility] Video Visit from 11/07/2022 in [facility] Video Visit from 09/19/2022 in [facility]  C-SSRS RISK CATEGORY No Risk No Risk No Risk        Assessment and Plan:   Lisa Herrera is a 27 year old, Hispanic female with a past psychiatric history significant for generalized anxiety disorder, PTSD, and major depressive disorder who presents to [facility] via virtual video visit for follow-up and medication management.  Patient presents to the encounter stating that she adjusted her sertraline dosage from 50 mg to 100 mg daily roughly 3 weeks ago due to experiencing elevated anxiety, depression, and stress.  She reports that the adjustment to her sertraline was helpful in managing her ongoing symptoms.  Provider informed patient that she should first consult with the provider before adjusting her medications on her own.  Patient vocalized understanding.   Patient continues to endorse depression and anxiety but states that her symptoms have been more manageable since adjusting her sertraline.  Patient would like to continue taking sertraline at  100 mg daily as well as taking buspirone as prescribed.  Patient's medications to be e-prescribed to pharmacy of choice.  Collaboration of Care: Collaboration of Care: Medication Management AEB provider managing patient's psychiatric medications, Primary Care Provider AEB patient being seen by primary care provider, Psychiatrist AEB patient being followed by mental health provider, and Other provider involved in patient's care AEB patient being followed by bariatrics  Patient/Guardian was advised Release of Information must be obtained prior to any record release in order to collaborate their care with an outside provider. Patient/Guardian was advised if they have not already done so to contact the registration department to sign all necessary forms in order for [place] to release information regarding their care.   Consent: Patient/Guardian gives verbal consent for treatment and assignment of benefits for services provided during this visit. Patient/Guardian expressed understanding and agreed to proceed.   1. Generalized anxiety disorder  - busPIRone (BUSPAR) 7.5 MG tablet; Take 1 tablet (7.5 mg total) by mouth 2 (two) times daily.  Dispense: 180 tablet; Refill: 0 - sertraline (ZOLOFT) 100 MG tablet; Take 1 tablet (100 mg total) by mouth daily. TAKE 1 TABLET(50 MG) BY MOUTH DAILY  Dispense: 90 tablet; Refill: 0  2. PTSD (post-traumatic stress disorder)  - sertraline (ZOLOFT) 100 MG tablet; Take 1 tablet (100 mg total) by mouth daily. TAKE 1 TABLET(50 MG) BY MOUTH DAILY  Dispense: 90 tablet; Refill: 0  3. Mild episode of recurrent major depressive disorder (HCC)  - sertraline (ZOLOFT) 100 MG tablet; Take 1 tablet (100 mg total) by mouth daily. TAKE 1 TABLET(50 MG) BY MOUTH DAILY  Dispense: 90 tablet; Refill: 0  Patient to follow-up in 2 months Provider spent a total of 32 minutes with the patient/reviewing patient's chart  Meta Hatchet, PA 01/09/2023, 6:22 PM

## 2023-03-13 ENCOUNTER — Telehealth (HOSPITAL_COMMUNITY): Payer: Self-pay | Admitting: Physician Assistant

## 2023-04-09 ENCOUNTER — Telehealth (INDEPENDENT_AMBULATORY_CARE_PROVIDER_SITE_OTHER): Payer: Self-pay | Admitting: Physician Assistant

## 2023-04-09 DIAGNOSIS — F411 Generalized anxiety disorder: Secondary | ICD-10-CM

## 2023-04-09 DIAGNOSIS — F4310 Post-traumatic stress disorder, unspecified: Secondary | ICD-10-CM

## 2023-04-09 DIAGNOSIS — F330 Major depressive disorder, recurrent, mild: Secondary | ICD-10-CM

## 2023-04-10 ENCOUNTER — Encounter (HOSPITAL_COMMUNITY): Payer: Self-pay | Admitting: Physician Assistant

## 2023-04-10 MED ORDER — BUSPIRONE HCL 7.5 MG PO TABS
7.5000 mg | ORAL_TABLET | Freq: Two times a day (BID) | ORAL | 0 refills | Status: DC
Start: 2023-04-10 — End: 2023-06-04

## 2023-04-10 MED ORDER — SERTRALINE HCL 100 MG PO TABS
100.0000 mg | ORAL_TABLET | Freq: Every day | ORAL | 0 refills | Status: DC
Start: 2023-04-10 — End: 2023-06-04

## 2023-04-10 NOTE — Progress Notes (Signed)
 [facility] MD/PA/NP OP Progress Note  Virtual Visit via Video Note  I connected with Lisa Herrera on 04/09/23 at  2:00 PM EST by a video enabled telemedicine application and verified that I am speaking with the correct person using two identifiers.  Location: Patient: Home Provider: Clinic   I discussed the limitations of evaluation and management by telemedicine and the availability of in person appointments. The patient expressed understanding and agreed to proceed.  Follow Up Instructions:   I discussed the assessment and treatment plan with the patient. The patient was provided an opportunity to ask questions and all were answered. The patient agreed with the plan and demonstrated an understanding of the instructions.   The patient was advised to call back or seek an in-person evaluation if the symptoms worsen or if the condition fails to improve as anticipated.  I provided 37 minutes of non-face-to-face time during this encounter.  Reginia FORBES Bolster, PA    04/09/2023 10:19 AM Jonette Burnet  MRN:  [id number]  Chief Complaint:  Chief Complaint  Patient presents with   Follow-up   Medication Refill   HPI:   Lisa Herrera is a 28 year old, Hispanic female with a past psychiatric history significant for generalized anxiety disorder, PTSD, and major depressive disorder who presents to [facility] via virtual video visit for follow-up and medication management.  Interpretive services were utilized during the duration of the encounter.  Patient is currently being managed on the following psychiatric medications:  Sertraline  (Zoloft ) 100 mg daily Buspirone  7.5 mg 2 times daily  Patient presents to the encounter stating that she has been taking her medications regularly.  Patient reports no issues or concerns with her current medication regimen and denies the need for dosage adjustments at this time.  Since taking her medications, patient denies depressive  symptoms nor does she endorse anxiety.  Patient denies any new stressors at this time.  Patient further denies any other issues or concerns.  A PHQ-9 screen was performed with the patient scoring an 8.  A GAD-7 screen was also performed with the patient scoring a 10.  Patient is alert and oriented x 4, calm, cooperative, and fully engaged in conversation during the encounter.  Patient describes her mood as calm.  Patient exhibits euthymic mood with appropriate affect.  Patient denies suicidal or homicidal ideations.  She further denies auditory or visual hallucinations and does not appear to be responding to internal/external stimuli.  Patient endorses good sleep and receives on average 6 to 8 hours of sleep per night.  Patient endorses good appetite and eats on average 3 meals per day.  Patient denies alcohol consumption, tobacco use, or illicit drug use.  Visit Diagnosis:    ICD-10-CM   1. Generalized anxiety disorder  F41.1 busPIRone  (BUSPAR ) 7.5 MG tablet    sertraline  (ZOLOFT ) 100 MG tablet    2. PTSD (post-traumatic stress disorder)  F43.10 sertraline  (ZOLOFT ) 100 MG tablet    3. Mild episode of recurrent major depressive disorder (HCC)  F33.0 sertraline  (ZOLOFT ) 100 MG tablet       Past Psychiatric History:  Major depressive disorder Generalized anxiety disorder PTSD  Past Medical History:  Past Medical History:  Diagnosis Date   Alpha thalassemia silent carrier 05/29/2019   Gallstones    Hepatic steatosis 02/04/2020   Migraine    Umbilical hernia without obstruction and without gangrene 02/04/2020    Past Surgical History:  Procedure Laterality Date   CESAREAN SECTION N/A 01/14/2016  Procedure: CESAREAN SECTION;  Surgeon: Gloris DELENA Hugger, MD;  Location: [facility];  Service: Obstetrics;  Laterality: N/A;   CESAREAN SECTION N/A 11/17/2019   Procedure: CESAREAN SECTION;  Surgeon: Barbra Lang PARAS, DO;  Location: [facility];  Service: Obstetrics;  Laterality: N/A;    CHOLECYSTECTOMY     TUBAL LIGATION Bilateral 11/17/2019   Procedure: BILATERAL TUBAL LIGATION;  Surgeon: Barbra Lang PARAS, DO;  Location: [facility];  Service: Obstetrics;  Laterality: Bilateral;    Family Psychiatric History:  Grandfather - patient states that her grandfather may have been schizophrenic but he was never officially diagnosed   Brother - patient states that her brother has been assessed for schizophrenia but has never been diagnosed  Family History:  Family History  Problem Relation Age of Onset   Breast cancer Maternal Aunt        50's   Colon cancer Neg Hx    Stomach cancer Neg Hx    Esophageal cancer Neg Hx     Social History:  Social History   Socioeconomic History   Marital status: Married    Spouse name: Not on file   Number of children: 1   Years of education: Not on file   Highest education level: Not on file  Occupational History   Not on file  Tobacco Use   Smoking status: Never   Smokeless tobacco: Never  Vaping Use   Vaping status: Never Used  Substance and Sexual Activity   Alcohol use: No    Alcohol/week: 0.0 standard drinks of alcohol   Drug use: No   Sexual activity: Yes    Birth control/protection: None  Other Topics Concern   Not on file  Social History Narrative   Married, originally from [place], Spanish-speaking.   Son born October 2021 and a daughter born in 2017   No alcohol no tobacco or drug use   Social Drivers of Corporate Investment Banker Strain: Not on file  Food Insecurity: Low Risk  (09/17/2022)   [facility]   Hunger Vital Sign    Worried About Running Out of Food in the Last Year: Never true    Ran Out of Food in the Last Year: Never true  Transportation Needs: No Transportation Needs (09/17/2022)   [facility]   Transportation    In the past 12 months, has lack of reliable transportation kept you from medical appointments, meetings, work or from  getting things needed for daily living? : No  Physical Activity: Not on file  Stress: Not on file  Social Connections: Not on file    Allergies:  Allergies  Allergen Reactions   Bupropion     Anxiousness Tachycardia    Metabolic Disorder Labs: Lab Results  Component Value Date   HGBA1C 5.3 05/25/2020   MPG 105.41 05/25/2020   No results found for: PROLACTIN Lab Results  Component Value Date   CHOL 187 05/25/2020   TRIG 111 05/25/2020   HDL 55 05/25/2020   CHOLHDL 3.4 05/25/2020   VLDL 22 05/25/2020   LDLCALC 110 (H) 05/25/2020   Lab Results  Component Value Date   TSH 1.836 05/25/2020    Therapeutic Level Labs: No results found for: LITHIUM No results found for: VALPROATE No results found for: CBMZ  Current Medications: Current Outpatient Medications  Medication Sig Dispense Refill   busPIRone  (BUSPAR ) 7.5 MG tablet Take 1 tablet (7.5 mg total) by mouth 2 (two)  times daily. 180 tablet 0   hydrOXYzine  (ATARAX /VISTARIL ) 25 MG tablet Take 1 tablet (25 mg total) by mouth every 6 (six) hours as needed for anxiety. 75 tablet 0   omeprazole  (PRILOSEC) 40 MG capsule Take 1 capsule (40 mg total) by mouth daily before breakfast. 30 capsule 2   oseltamivir  (TAMIFLU ) 75 MG capsule Take 1 capsule (75 mg total) by mouth every 12 (twelve) hours. 10 capsule 0   Prenatal Vit-Fe Fumarate-FA (PRENATAL MULTIVITAMIN) TABS tablet Take 1 tablet by mouth daily.      sertraline  (ZOLOFT ) 100 MG tablet Take 1 tablet (100 mg total) by mouth daily. TAKE 1 TABLET(50 MG) BY MOUTH DAILY 90 tablet 0   traZODone  (DESYREL ) 50 MG tablet Take 1 tablet (50 mg total) by mouth at bedtime. For sleep 30 tablet 0   No current facility-administered medications for this visit.     Musculoskeletal: Strength & Muscle Tone: within normal limits Gait & Station: normal Patient leans: N/A  Psychiatric Specialty Exam: Review of Systems  Psychiatric/Behavioral:  Positive for sleep disturbance.  Negative for decreased concentration, dysphoric mood, hallucinations, self-injury and suicidal ideas. The patient is nervous/anxious. The patient is not hyperactive.     There were no vitals taken for this visit.There is no height or weight on file to calculate BMI.  General Appearance: Casual  Eye Contact:  Good  Speech:  Clear and Coherent and Normal Rate  Volume:  Normal  Mood:  Anxious and Depressed  Affect:  Appropriate  Thought Process:  Coherent, Goal Directed, and Descriptions of Associations: Intact  Orientation:  Full (Time, Place, and Person)  Thought Content: WDL   Suicidal Thoughts:  No  Homicidal Thoughts:  No  Memory:  Immediate;   Good Recent;   Good Remote;   Good  Judgement:  Good  Insight:  Good  Psychomotor Activity:  Normal  Concentration:  Concentration: Good and Attention Span: Good  Recall:  Good  Fund of Knowledge: Good  Language: Good  Akathisia:  No  Handed:  Right  AIMS (if indicated): not done  Assets:  Communication Skills Desire for Improvement Housing Intimacy Social Support Vocational/Educational  ADL's:  Intact  Cognition: WNL  Sleep:  Fair   Screenings: AIMS    Flowsheet Row [facility]  AIMS Total Score 0      AUDIT    Flowsheet Row [facility]  Alcohol Use Disorder Identification Test Final Score (AUDIT) 0      GAD-7    Flowsheet Row Video Visit from 04/09/2023 in [facility] Video Visit from 01/09/2023 in [facility] Video Visit from 11/07/2022 in [facility] Video Visit from 09/19/2022 in [facility] Video Visit from 04/10/2022 in [facility]  Total GAD-7 Score 10 10 10 10 6       PHQ2-9    Flowsheet Row Video Visit from 04/09/2023 in  [facility] Video Visit from 01/09/2023 in [facility] Video Visit from 11/07/2022 in [facility] Video Visit from 09/19/2022 in [facility] Video Visit from 04/10/2022 in [facility]  PHQ-2 Total Score 2 3 0 3 1  PHQ-9 Total Score 8 8 -- 12 --      Flowsheet Row Video Visit from 04/09/2023 in [facility] Video Visit from 01/09/2023 in [facility] Video Visit from 11/07/2022 in [facility]  C-SSRS RISK CATEGORY No Risk No Risk No Risk        Assessment and Plan:   Helvi Royals is a 28 year old, Hispanic female with a past psychiatric history significant for generalized anxiety disorder, PTSD, and major depressive disorder who presents to [facility] via virtual video visit for follow-up and medication management.  Patient presents today encounter reporting no issues or concerns regarding her current medication regimen.  Patient endorses stability on her current medication regimen and denies the need for dosage adjustments at this time.  Patient denies depressive symptoms nor does she endorse anxiety.  Patient exhibits euthymic mood with appropriate affect.  Patient does not appear to be exhibiting any symptoms of serotonin syndrome such as mental status change, seizure, elevated temperature, or tremor.  Patient would like to continue taking her medications as prescribed.  Patient's medications to be e-prescribed to pharmacy of choice.  Collaboration of Care: Collaboration of Care: Medication Management AEB provider managing patient's psychiatric medications, Primary Care Provider AEB patient being seen by primary care provider, Psychiatrist AEB patient being followed by mental health provider, and Other provider involved in patient's  care AEB patient being followed by bariatrics  Patient/Guardian was advised Release of Information must be obtained prior to any record release in order to collaborate their care with an outside provider. Patient/Guardian was advised if they have not already done so to contact the registration department to sign all necessary forms in order for us  to release information regarding their care.   Consent: Patient/Guardian gives verbal consent for treatment and assignment of benefits for services provided during this visit. Patient/Guardian expressed understanding and agreed to proceed.   1. Generalized anxiety disorder  - busPIRone  (BUSPAR ) 7.5 MG tablet; Take 1 tablet (7.5 mg total) by mouth 2 (two) times daily.  Dispense: 180 tablet; Refill: 0 - sertraline  (ZOLOFT ) 100 MG tablet; Take 1 tablet (100 mg total) by mouth daily. TAKE 1 TABLET(50 MG) BY MOUTH DAILY  Dispense: 90 tablet; Refill: 0  2. PTSD (post-traumatic stress disorder)  - sertraline  (ZOLOFT ) 100 MG tablet; Take 1 tablet (100 mg total) by mouth daily. TAKE 1 TABLET(50 MG) BY MOUTH DAILY  Dispense: 90 tablet; Refill: 0  3. Mild episode of recurrent major depressive disorder (HCC) With - sertraline  (ZOLOFT ) 100 MG tablet; Take 1 tablet (100 mg total) by mouth daily. TAKE 1 TABLET(50 MG) BY MOUTH DAILY  Dispense: 90 tablet; Refill: 0  Patient to follow-up in 2 months Provider spent a total of 37 minutes with the patient/reviewing patient's chart  Reginia FORBES Bolster, PA 04/09/2023, 10:19 AM

## 2023-06-04 ENCOUNTER — Encounter (HOSPITAL_COMMUNITY): Payer: Self-pay | Admitting: Physician Assistant

## 2023-06-04 ENCOUNTER — Telehealth (INDEPENDENT_AMBULATORY_CARE_PROVIDER_SITE_OTHER): Payer: Self-pay | Admitting: Physician Assistant

## 2023-06-04 DIAGNOSIS — F4310 Post-traumatic stress disorder, unspecified: Secondary | ICD-10-CM

## 2023-06-04 DIAGNOSIS — F330 Major depressive disorder, recurrent, mild: Secondary | ICD-10-CM

## 2023-06-04 DIAGNOSIS — F411 Generalized anxiety disorder: Secondary | ICD-10-CM | POA: Diagnosis not present

## 2023-06-04 MED ORDER — SERTRALINE HCL 100 MG PO TABS
100.0000 mg | ORAL_TABLET | Freq: Every day | ORAL | 0 refills | Status: DC
Start: 2023-06-04 — End: 2023-08-07

## 2023-06-04 MED ORDER — BUSPIRONE HCL 7.5 MG PO TABS
7.5000 mg | ORAL_TABLET | Freq: Two times a day (BID) | ORAL | 0 refills | Status: DC
Start: 2023-06-04 — End: 2023-08-07

## 2023-06-04 NOTE — Progress Notes (Unsigned)
 [facility] MD/PA/NP OP Progress Note  Virtual Visit via Video Note  I connected with Lisa Herrera on 06/04/23 at  1:00 PM EDT by a video enabled telemedicine application and verified that I am speaking with the correct person using two identifiers.  Location: Patient: Home Provider: Clinic   I discussed the limitations of evaluation and management by telemedicine and the availability of in person appointments. The patient expressed understanding and agreed to proceed.  Follow Up Instructions:   I discussed the assessment and treatment plan with the patient. The patient was provided an opportunity to ask questions and all were answered. The patient agreed with the plan and demonstrated an understanding of the instructions.   The patient was advised to call back or seek an in-person evaluation if the symptoms worsen or if the condition fails to improve as anticipated.  I provided 29 minutes of non-face-to-face time during this encounter.  Meta Hatchet, PA    06/04/2023 9:54 PM Tiny Chaudhary  MRN:  [id number]  Chief Complaint:  Chief Complaint  Patient presents with   Follow-up   Medication Refill   HPI:   Lisa Herrera is a 28 year old, Hispanic female with a past psychiatric history significant for generalized anxiety disorder, PTSD, and major depressive disorder who presents to [facility] via virtual video visit for follow-up and medication management.  Interpretive services were utilized during the duration of the encounter.  Patient is currently being managed on the following psychiatric medications:  Sertraline (Zoloft) 100 mg daily Buspirone 7.5 mg 2 times daily  Patient reports that she is taking her medications regularly and denies experiencing any adverse side effects.  Patient denies overt depressive symptoms but does endorse some anxiety.  Patient rates her anxiety at 3 out of 10 but denies any new stressors at this time.  A GAD-7  screen was performed with the patient scoring a 10.  Patient is alert and oriented x 4, calm, cooperative, and fully engaged in conversation during the encounter.  Patient endorses relaxed mood.  Patient exhibits euthymic mood with appropriate affect.  Patient denies suicidal or homicidal ideations.  She further denies auditory or visual hallucinations and does not appear to be responding to internal/external stimuli.  Patient endorses good sleep (on average 7 hours of sleep per night.  Patient endorses good appetite and eats on average 3 meals per day.  Patient denies alcohol consumption, tobacco use, or illicit drug use.  Visit Diagnosis:    ICD-10-CM   1. Generalized anxiety disorder  F41.1 sertraline (ZOLOFT) 100 MG tablet    busPIRone (BUSPAR) 7.5 MG tablet    2. PTSD (post-traumatic stress disorder)  F43.10 sertraline (ZOLOFT) 100 MG tablet    3. Mild episode of recurrent major depressive disorder (HCC)  F33.0 sertraline (ZOLOFT) 100 MG tablet        Past Psychiatric History:  Major depressive disorder Generalized anxiety disorder PTSD  Past Medical History:  Past Medical History:  Diagnosis Date   Alpha thalassemia silent carrier 05/29/2019   Gallstones    Hepatic steatosis 02/04/2020   Migraine    Umbilical hernia without obstruction and without gangrene 02/04/2020    Past Surgical History:  Procedure Laterality Date   CESAREAN SECTION N/A 01/14/2016   Procedure: CESAREAN SECTION;  Surgeon: Tereso Newcomer, MD;  Location: [facility];  Service: Obstetrics;  Laterality: N/A;   CESAREAN SECTION N/A 11/17/2019   Procedure: CESAREAN SECTION;  Surgeon: Levie Heritage, DO;  Location: [facility]  LD ORS;  Service: Obstetrics;  Laterality: N/A;   CHOLECYSTECTOMY     TUBAL LIGATION Bilateral 11/17/2019   Procedure: BILATERAL TUBAL LIGATION;  Surgeon: Levie Heritage, DO;  Location: [facility] LD ORS;  Service: Obstetrics;  Laterality: Bilateral;    Family Psychiatric History:   Grandfather - patient states that her grandfather may have been schizophrenic but he was never officially diagnosed   Brother - patient states that her brother has been assessed for schizophrenia but has never been diagnosed  Family History:  Family History  Problem Relation Age of Onset   Breast cancer Maternal Aunt        50's   Colon cancer Neg Hx    Stomach cancer Neg Hx    Esophageal cancer Neg Hx     Social History:  Social History   Socioeconomic History   Marital status: Married    Spouse name: Not on file   Number of children: 1   Years of education: Not on file   Highest education level: Not on file  Occupational History   Not on file  Tobacco Use   Smoking status: Never   Smokeless tobacco: Never  Vaping Use   Vaping status: Never Used  Substance and Sexual Activity   Alcohol use: No    Alcohol/week: 0.0 standard drinks of alcohol   Drug use: No   Sexual activity: Yes    Birth control/protection: None  Other Topics Concern   Not on file  Social History Narrative   Married, originally from [place], Spanish-speaking.   Son born October 2021 and a daughter born in 2017   No alcohol no tobacco or drug use   Social Drivers of Corporate investment banker Strain: Not on file  Food Insecurity: Unknown (05/30/2023)   Received from [facility]   Hunger Vital Sign    Worried About Running Out of Food in the Last Year: Patient declined to answer    Ran Out of Food in the Last Year: Patient declined to answer  Transportation Needs: No Transportation Needs (05/30/2023)   Received from [facility]    In the past 12 months, has lack of reliable transportation kept you from medical appointments, meetings, work or from getting things needed for daily living? : No  Physical Activity: Not on file  Stress: Not on file  Social Connections: Not on file    Allergies:  Allergies  Allergen Reactions   Bupropion     Anxiousness Tachycardia     Metabolic Disorder Labs: Lab Results  Component Value Date   HGBA1C 5.3 05/25/2020   MPG 105.41 05/25/2020   No results found for: "PROLACTIN" Lab Results  Component Value Date   CHOL 187 05/25/2020   TRIG 111 05/25/2020   HDL 55 05/25/2020   CHOLHDL 3.4 05/25/2020   VLDL 22 05/25/2020   LDLCALC 110 (H) 05/25/2020   Lab Results  Component Value Date   TSH 1.836 05/25/2020    Therapeutic Level Labs: No results found for: "LITHIUM" No results found for: "VALPROATE" No results found for: "CBMZ"  Current Medications: Current Outpatient Medications  Medication Sig Dispense Refill   busPIRone (BUSPAR) 7.5 MG tablet Take 1 tablet (7.5 mg total) by mouth 2 (two) times daily. 180 tablet 0   hydrOXYzine (ATARAX/VISTARIL) 25 MG tablet Take 1 tablet (25 mg total) by mouth every 6 (six) hours as needed for anxiety. 75 tablet 0   omeprazole (PRILOSEC) 40 MG capsule Take 1 capsule (40  mg total) by mouth daily before breakfast. 30 capsule 2   oseltamivir (TAMIFLU) 75 MG capsule Take 1 capsule (75 mg total) by mouth every 12 (twelve) hours. 10 capsule 0   Prenatal Vit-Fe Fumarate-FA (PRENATAL MULTIVITAMIN) TABS tablet Take 1 tablet by mouth daily.      sertraline (ZOLOFT) 100 MG tablet Take 1 tablet (100 mg total) by mouth daily. TAKE 1 TABLET(50 MG) BY MOUTH DAILY 90 tablet 0   traZODone (DESYREL) 50 MG tablet Take 1 tablet (50 mg total) by mouth at bedtime. For sleep 30 tablet 0   No current facility-administered medications for this visit.     Musculoskeletal: Strength & Muscle Tone: within normal limits Gait & Station: normal Patient leans: N/A  Psychiatric Specialty Exam: Review of Systems  Psychiatric/Behavioral:  Negative for decreased concentration, dysphoric mood, hallucinations, self-injury, sleep disturbance and suicidal ideas. The patient is nervous/anxious. The patient is not hyperactive.     There were no vitals taken for this visit.There is no height or weight on  file to calculate BMI.  General Appearance: Casual  Eye Contact:  Good  Speech:  Clear and Coherent and Normal Rate  Volume:  Normal  Mood:  Anxious  Affect:  Appropriate  Thought Process:  Coherent, Goal Directed, and Descriptions of Associations: Intact  Orientation:  Full (Time, Place, and Person)  Thought Content: WDL   Suicidal Thoughts:  No  Homicidal Thoughts:  No  Memory:  Immediate;   Good Recent;   Good Remote;   Good  Judgement:  Good  Insight:  Good  Psychomotor Activity:  Normal  Concentration:  Concentration: Good and Attention Span: Good  Recall:  Good  Fund of Knowledge: Good  Language: Good  Akathisia:  No  Handed:  Right  AIMS (if indicated): not done  Assets:  Communication Skills Desire for Improvement Housing Intimacy Social Support Vocational/Educational  ADL's:  Intact  Cognition: WNL  Sleep:  Good   Screenings: AIMS    Flowsheet Row [facility]  AIMS Total Score 0      AUDIT    Flowsheet Row [facility]  Alcohol Use Disorder Identification Test Final Score (AUDIT) 0      GAD-7    Flowsheet Row Video Visit from 06/04/2023 in [facility] Video Visit from 04/09/2023 in [facility] Video Visit from [facility]/09/2023 in [facility] Video Visit from 11/07/2022 in [facility] Video Visit from 09/19/2022 in [facility]  Total GAD-7 Score 10 10 10 10 10       PHQ2-9    Flowsheet Row Video Visit from 06/04/2023 in [facility] Video Visit from 04/09/2023 in [facility] Video Visit from 01/09/2023 in [facility] Video Visit from 11/07/2022 in [facility] Video  Visit from 09/19/2022 in [facility]  PHQ-2 Total Score 0 2 3 0 3  PHQ-9 Total Score -- 8 8 -- 12      Flowsheet Row Video Visit from 06/04/2023 in [facility] Video Visit from 04/09/2023 in [facility] Video Visit from 01/09/2023 in [facility]  C-SSRS RISK CATEGORY No Risk No Risk No Risk        Assessment and Plan:   Lisa  Herrera is a 28 year old, Hispanic female with a past psychiatric history significant for generalized anxiety disorder, PTSD, and major depressive disorder who presents to [facility] via virtual video visit for follow-up and medication management.  Patient reports that she is taking her medications regularly and denies experiencing any adverse side effects.  Patient denies the need for dosage adjustments at this time.  Patient denies overt depressive symptoms but does endorse some anxiety.  Patient denies any new stressors at this time.  Patient endorses stability on her current medication regimen and would like to continue taking her medications as prescribed.  Patient's medications to be e-prescribed to pharmacy of choice.  Collaboration of Care: Collaboration of Care: Medication Management AEB provider managing patient's psychiatric medications, Primary Care Provider AEB patient being seen by primary care provider, Psychiatrist AEB patient being followed by mental health provider, and Other provider involved in patient's care AEB patient being followed by bariatrics  Patient/Guardian was advised Release of Information must be obtained prior to any record release in order to collaborate their care with an outside provider. Patient/Guardian was advised if they have not already done so to contact the registration department to sign all necessary forms in order for [place] to release information regarding their care.   Consent:  Patient/Guardian gives verbal consent for treatment and assignment of benefits for services provided during this visit. Patient/Guardian expressed understanding and agreed to proceed.   1. Generalized anxiety disorder  - sertraline (ZOLOFT) 100 MG tablet; Take 1 tablet (100 mg total) by mouth daily. TAKE 1 TABLET(50 MG) BY MOUTH DAILY  Dispense: 90 tablet; Refill: 0 - busPIRone (BUSPAR) 7.5 MG tablet; Take 1 tablet (7.5 mg total) by mouth 2 (two) times daily.  Dispense: 180 tablet; Refill: 0  2. PTSD (post-traumatic stress disorder)  - sertraline (ZOLOFT) 100 MG tablet; Take 1 tablet (100 mg total) by mouth daily. TAKE 1 TABLET(50 MG) BY MOUTH DAILY  Dispense: 90 tablet; Refill: 0  3. Mild episode of recurrent major depressive disorder (HCC)  - sertraline (ZOLOFT) 100 MG tablet; Take 1 tablet (100 mg total) by mouth daily. TAKE 1 TABLET(50 MG) BY MOUTH DAILY  Dispense: 90 tablet; Refill: 0  Patient to follow-up in 2 months Provider spent a total of 29 minutes with the patient/reviewing patient's chart  Meta Hatchet, PA 06/04/2023, 9:54 PM

## 2023-08-06 ENCOUNTER — Encounter (HOSPITAL_COMMUNITY): Payer: Self-pay

## 2023-08-06 ENCOUNTER — Telehealth (HOSPITAL_COMMUNITY): Payer: Self-pay | Admitting: Physician Assistant

## 2023-08-07 ENCOUNTER — Telehealth (INDEPENDENT_AMBULATORY_CARE_PROVIDER_SITE_OTHER): Admitting: Physician Assistant

## 2023-08-07 ENCOUNTER — Encounter (HOSPITAL_COMMUNITY): Payer: Self-pay | Admitting: Physician Assistant

## 2023-08-07 DIAGNOSIS — F4310 Post-traumatic stress disorder, unspecified: Secondary | ICD-10-CM | POA: Diagnosis not present

## 2023-08-07 DIAGNOSIS — F411 Generalized anxiety disorder: Secondary | ICD-10-CM

## 2023-08-07 DIAGNOSIS — F330 Major depressive disorder, recurrent, mild: Secondary | ICD-10-CM | POA: Diagnosis not present

## 2023-08-07 MED ORDER — SERTRALINE HCL 100 MG PO TABS
100.0000 mg | ORAL_TABLET | Freq: Every day | ORAL | 0 refills | Status: DC
Start: 2023-08-07 — End: 2023-12-10

## 2023-08-07 MED ORDER — BUSPIRONE HCL 7.5 MG PO TABS
7.5000 mg | ORAL_TABLET | Freq: Two times a day (BID) | ORAL | 0 refills | Status: DC
Start: 2023-08-07 — End: 2023-12-10

## 2023-08-07 NOTE — Progress Notes (Signed)
 [facility] MD/PA/NP OP Progress Note  Virtual Visit via Video Note  I connected with Lisa Herrera on 08/07/23 at  3:30 PM EDT by a video enabled telemedicine application and verified that I am speaking with the correct person using two identifiers.  Location: Patient: Home Provider: Clinic   I discussed the limitations of evaluation and management by telemedicine and the availability of in person appointments. The patient expressed understanding and agreed to proceed.  Follow Up Instructions:   I discussed the assessment and treatment plan with the patient. The patient was provided an opportunity to ask questions and all were answered. The patient agreed with the plan and demonstrated an understanding of the instructions.   The patient was advised to call back or seek an in-person evaluation if the symptoms worsen or if the condition fails to improve as anticipated.  I provided 31 minutes of non-face-to-face time during this encounter.  Lisa Kasal, PA    08/07/2023 4:19 PM Lisa Herrera  MRN:  [id number]  Chief Complaint:  Chief Complaint  Patient presents with   Follow-up   Medication Refill   HPI:   Lisa Herrera is a 28 year old, Hispanic female with a past psychiatric history significant for generalized anxiety disorder, PTSD, and major depressive disorder who presents to [facility] via virtual video visit for follow-up and medication management.  Interpretive services were utilized during the duration of the encounter.  Patient is currently being managed on the following psychiatric medications:  Sertraline  (Zoloft ) 100 mg daily Buspirone  7.5 mg 2 times daily  Patient presents to the encounter stating that he is doing fine.  She reports that she takes her medications regularly and denies experiencing any adverse.  Patient denies overt depressive symptoms but does endorse some anxiety.  Patient rates her anxiety as 7 out of 10.  She  attributes her anxiety to family related issues.  Patient denies any other concerns at this time.  A PHQ-9 screen was performed with the patient scoring a 6.  A GAD-7 screen was also performed with the patient scoring a 6.  Patient is alert and oriented x 4, calm, cooperative, and fully engaged in conversation during the encounter.  Patient describes her mood as nervous and exhausted.  Patient reports that she is nervous about an upcoming trip that she will be attending.  Patient exhibits euthymic mood with appropriate affect.  Patient denies suicidal or homicidal ideation.  She further denies auditory or visual hallucinations and does not appear to be responding to internal/external stimuli.  Patient endorses good sleep (on average 6 to 7 hours of sleep good appetite and eats on average 5 small meals per day patient denies alcohol consumption, tobacco use, or illicit drug use.  Visit Diagnosis:    ICD-10-CM   1. Generalized anxiety disorder  F41.1 sertraline  (ZOLOFT ) 100 MG tablet    busPIRone  (BUSPAR ) 7.5 MG tablet    2. PTSD (post-traumatic stress disorder)  F43.10 sertraline  (ZOLOFT ) 100 MG tablet    3. Mild episode of recurrent major depressive disorder (HCC)  F33.0 sertraline  (ZOLOFT ) 100 MG tablet      Past Psychiatric History:  Major depressive disorder Generalized anxiety disorder PTSD  Past Medical History:  Past Medical History:  Diagnosis Date   Alpha thalassemia silent carrier 05/29/2019   Gallstones    Hepatic steatosis 02/04/2020   Migraine    Umbilical hernia without obstruction and without gangrene 02/04/2020    Past Surgical History:  Procedure Laterality Date  CESAREAN SECTION N/A 01/14/2016   Procedure: CESAREAN SECTION;  Surgeon: Julianne Octave, MD;  Location: [facility];  Service: Obstetrics;  Laterality: N/A;   CESAREAN SECTION N/A 11/17/2019   Procedure: CESAREAN SECTION;  Surgeon: Malka Sea, DO;  Location: [facility];  Service: Obstetrics;   Laterality: N/A;   CHOLECYSTECTOMY     TUBAL LIGATION Bilateral 11/17/2019   Procedure: BILATERAL TUBAL LIGATION;  Surgeon: Malka Sea, DO;  Location: [facility];  Service: Obstetrics;  Laterality: Bilateral;    Family Psychiatric History:  Grandfather - patient states that her grandfather may have been schizophrenic but he was never officially diagnosed   Brother - patient states that her brother has been assessed for schizophrenia but has never been diagnosed  Family History:  Family History  Problem Relation Age of Onset   Breast cancer Maternal Aunt        50's   Colon cancer Neg Hx    Stomach cancer Neg Hx    Esophageal cancer Neg Hx     Social History:  Social History   Socioeconomic History   Marital status: Married    Spouse name: Not on file   Number of children: 1   Years of education: Not on file   Highest education level: Not on file  Occupational History   Not on file  Tobacco Use   Smoking status: Never   Smokeless tobacco: Never  Vaping Use   Vaping status: Never Used  Substance and Sexual Activity   Alcohol use: No    Alcohol/week: 0.0 standard drinks of alcohol   Drug use: No   Sexual activity: Yes    Birth control/protection: None  Other Topics Concern   Not on file  Social History Narrative   Married, originally from [place], Spanish-speaking.   Son born October 2021 and a daughter born in 2017   No alcohol no tobacco or drug use   Social Drivers of Corporate investment banker Strain: Not on file  Food Insecurity: Unknown (05/30/2023)   Received from [facility]   Hunger Vital Sign    Worried About Running Out of Food in the Last Year: Patient declined to answer    Ran Out of Food in the Last Year: Patient declined to answer  Transportation Needs: No Transportation Needs (05/30/2023)   Received from [facility]    In the past 12 months, has lack of reliable transportation kept you from medical appointments, meetings,  work or from getting things needed for daily living? : No  Physical Activity: Not on file  Stress: Not on file  Social Connections: Not on file    Allergies:  Allergies  Allergen Reactions   Bupropion     Anxiousness Tachycardia    Metabolic Disorder Labs: Lab Results  Component Value Date   HGBA1C 5.3 05/25/2020   MPG 105.41 05/25/2020   No results found for: "PROLACTIN" Lab Results  Component Value Date   CHOL 187 05/25/2020   TRIG 111 05/25/2020   HDL 55 05/25/2020   CHOLHDL 3.4 05/25/2020   VLDL 22 05/25/2020   LDLCALC 110 (H) 05/25/2020   Lab Results  Component Value Date   TSH 1.836 05/25/2020    Therapeutic Level Labs: No results found for: "LITHIUM" No results found for: "VALPROATE" No results found for: "CBMZ"  Current Medications: Current Outpatient Medications  Medication Sig Dispense Refill   busPIRone  (BUSPAR ) 7.5 MG tablet Take 1 tablet (7.5 mg total)  by mouth 2 (two) times daily. 180 tablet 0   hydrOXYzine  (ATARAX /VISTARIL ) 25 MG tablet Take 1 tablet (25 mg total) by mouth every 6 (six) hours as needed for anxiety. 75 tablet 0   omeprazole  (PRILOSEC) 40 MG capsule Take 1 capsule (40 mg total) by mouth daily before breakfast. 30 capsule 2   oseltamivir  (TAMIFLU ) 75 MG capsule Take 1 capsule (75 mg total) by mouth every 12 (twelve) hours. 10 capsule 0   Prenatal Vit-Fe Fumarate-FA (PRENATAL MULTIVITAMIN) TABS tablet Take 1 tablet by mouth daily.      sertraline  (ZOLOFT ) 100 MG tablet Take 1 tablet (100 mg total) by mouth daily. TAKE 1 TABLET(50 MG) BY MOUTH DAILY 90 tablet 0   traZODone  (DESYREL ) 50 MG tablet Take 1 tablet (50 mg total) by mouth at bedtime. For sleep 30 tablet 0   No current facility-administered medications for this visit.     Musculoskeletal: Strength & Muscle Tone: within normal limits Gait & Station: normal Patient leans: N/A  Psychiatric Specialty Exam: Review of Systems  Psychiatric/Behavioral:  Negative for decreased  concentration, dysphoric mood, hallucinations, self-injury, sleep disturbance and suicidal ideas. The patient is nervous/anxious. The patient is not hyperactive.     There were no vitals taken for this visit.There is no height or weight on file to calculate BMI.  General Appearance: Casual  Eye Contact:  Good  Speech:  Clear and Coherent and Normal Rate  Volume:  Normal  Mood:  Anxious and Depressed  Affect:  Appropriate  Thought Process:  Coherent, Goal Directed, and Descriptions of Associations: Intact  Orientation:  Full (Time, Place, and Person)  Thought Content: WDL   Suicidal Thoughts:  No  Homicidal Thoughts:  No  Memory:  Immediate;   Good Recent;   Good Remote;   Good  Judgement:  Good  Insight:  Good  Psychomotor Activity:  Normal  Concentration:  Concentration: Good and Attention Span: Good  Recall:  Good  Fund of Knowledge: Good  Language: Good  Akathisia:  No  Handed:  Right  AIMS (if indicated): not done  Assets:  Communication Skills Desire for Improvement Housing Intimacy Social Support Vocational/Educational  ADL's:  Intact  Cognition: WNL  Sleep:  Good   Screenings: AIMS    Flowsheet Row [facility]  AIMS Total Score 0      AUDIT    Flowsheet Row [facility]  Alcohol Use Disorder Identification Test Final Score (AUDIT) 0      GAD-7    Flowsheet Row Video Visit from 08/07/2023 in [facility] Video Visit from 06/04/2023 in [facility] Video Visit from 04/09/2023 in [facility] Video Visit from 01/09/2023 in [facility] Video Visit from 11/07/2022 in [facility]  Total GAD-7 Score 6 10 10 10 10       PHQ2-9    Flowsheet Row Video Visit from 08/07/2023 in  [facility] Video Visit from 06/04/2023 in [facility] Video Visit from 04/09/2023 in [facility] Video Visit from 01/09/2023 in [facility] Video Visit from 11/07/2022 in [facility]  PHQ-2 Total Score 2 0 2 3 0  PHQ-9 Total Score 6 -- 8 8 --      Flowsheet Row Video Visit from 08/07/2023 in  [facility] Video Visit from 06/04/2023 in [facility] Video Visit from 04/09/2023 in [facility]  C-SSRS RISK CATEGORY No Risk No Risk No Risk        Assessment and Plan:   Kapri Hurless is a 28 year old, Hispanic female with a past psychiatric history significant for generalized anxiety disorder, PTSD, and major depressive disorder who presents to [facility] via virtual video visit for follow-up and medication management.  Patient presents to the encounter stating that she has been taking her medications regularly.  Patient denies experiencing any adverse side effects and further denies the need for dosage adjustments at this time.  Patient denies overt depressive symptoms but does endorse some anxiety she attributes to family related issues.  A PHQ-9 screen was performed with the patient scored a 6.  A GAD-7 screen was also done with the patient scoring 6.  Despite her anxiety, patient denies the need for dosage adjustments at this time and is willing to continue to medications as prescribed.  Patient's medications to be e-prescribed to pharmacy of choice.  Patient denies suicidal ideations and is able to contract for safety following the conclusion of the encounter.  It was brought to the attention of the provider that patient has commercial insurance.  Due to patient's commercial insurance, patient will need to be referred to another  psychiatric outpatient facility.  Provider to instruct patient to contact her insurance company to determine what psychiatric facilities accept her insurance.  Patient vocalized understanding.  Collaboration of Care: Collaboration of Care: Medication Management AEB provider managing patient's psychiatric medications, Primary Care Provider AEB patient being seen by primary care provider, Psychiatrist AEB patient being followed by mental health provider, and Other provider involved in patient's care AEB patient being followed by bariatrics  Patient/Guardian was advised Release of Information must be obtained prior to any record release in order to collaborate their care with an outside provider. Patient/Guardian was advised if they have not already done so to contact the registration department to sign all necessary forms in order for us  to release information regarding their care.   Consent: Patient/Guardian gives verbal consent for treatment and assignment of benefits for services provided during this visit. Patient/Guardian expressed understanding and agreed to proceed.   1. Generalized anxiety disorder  - sertraline  (ZOLOFT ) 100 MG tablet; Take 1 tablet (100 mg total) by mouth daily. TAKE 1 TABLET(50 MG) BY MOUTH DAILY  Dispense: 90 tablet; Refill: 0 - busPIRone  (BUSPAR ) 7.5 MG tablet; Take 1 tablet (7.5 mg total) by mouth 2 (two) times daily.  Dispense: 180 tablet; Refill: 0  2. PTSD (post-traumatic stress disorder)  - sertraline  (ZOLOFT ) 100 MG tablet; Take 1 tablet (100 mg total) by mouth daily. TAKE 1 TABLET(50 MG) BY MOUTH DAILY  Dispense: 90 tablet; Refill: 0  3. Mild episode of recurrent major depressive disorder (HCC)  - sertraline  (ZOLOFT ) 100 MG tablet; Take 1 tablet (100 mg total) by mouth daily. TAKE 1 TABLET(50 MG) BY MOUTH DAILY  Dispense: 90 tablet; Refill: 0  Due to patient having commercial insurance, patient to be discharged from this facility Patient to be discharged with a  3 month supply of their medications Provider spent a total of 31 minutes with the patient/reviewing patient's chart  Lisa Kasal, PA 08/07/2023, 4:19 PM

## 2023-11-13 ENCOUNTER — Other Ambulatory Visit (HOSPITAL_COMMUNITY): Payer: Self-pay | Admitting: Physician Assistant

## 2023-11-13 DIAGNOSIS — F330 Major depressive disorder, recurrent, mild: Secondary | ICD-10-CM

## 2023-11-13 DIAGNOSIS — F411 Generalized anxiety disorder: Secondary | ICD-10-CM

## 2023-11-13 DIAGNOSIS — F4310 Post-traumatic stress disorder, unspecified: Secondary | ICD-10-CM

## 2023-12-09 ENCOUNTER — Other Ambulatory Visit (HOSPITAL_COMMUNITY): Payer: Self-pay | Admitting: Physician Assistant

## 2023-12-09 DIAGNOSIS — F411 Generalized anxiety disorder: Secondary | ICD-10-CM

## 2023-12-09 DIAGNOSIS — F330 Major depressive disorder, recurrent, mild: Secondary | ICD-10-CM

## 2023-12-09 DIAGNOSIS — F4310 Post-traumatic stress disorder, unspecified: Secondary | ICD-10-CM

## 2023-12-10 ENCOUNTER — Telehealth (HOSPITAL_COMMUNITY): Admitting: Physician Assistant

## 2023-12-10 ENCOUNTER — Encounter (HOSPITAL_COMMUNITY): Payer: Self-pay | Admitting: Physician Assistant

## 2023-12-10 DIAGNOSIS — F411 Generalized anxiety disorder: Secondary | ICD-10-CM

## 2023-12-10 DIAGNOSIS — F4310 Post-traumatic stress disorder, unspecified: Secondary | ICD-10-CM | POA: Diagnosis not present

## 2023-12-10 DIAGNOSIS — F330 Major depressive disorder, recurrent, mild: Secondary | ICD-10-CM | POA: Diagnosis not present

## 2023-12-10 MED ORDER — BUSPIRONE HCL 7.5 MG PO TABS: 7.5000 mg | ORAL_TABLET | Freq: Two times a day (BID) | ORAL | 0 refills | Status: DC

## 2023-12-10 MED ORDER — SERTRALINE HCL 100 MG PO TABS
100.0000 mg | ORAL_TABLET | Freq: Every day | ORAL | 0 refills | Status: DC
Start: 2023-12-10 — End: 2024-02-03

## 2023-12-10 NOTE — Progress Notes (Signed)
 [facility] MD/PA/NP OP Progress Note  Virtual Visit via Video Note  I connected with Lisa Herrera on 12/10/23 at  1:30 PM EDT by a video enabled telemedicine application and verified that I am speaking with the correct person using two identifiers.  Location: Patient: Home Provider: Clinic   I discussed the limitations of evaluation and management by telemedicine and the availability of in person appointments. The patient expressed understanding and agreed to proceed.  Follow Up Instructions:   I discussed the assessment and treatment plan with the patient. The patient was provided an opportunity to ask questions and all were answered. The patient agreed with the plan and demonstrated an understanding of the instructions.   The patient was advised to call back or seek an in-person evaluation if the symptoms worsen or if the condition fails to improve as anticipated.  I provided 13 minutes of non-face-to-face time during this encounter.  Reginia FORBES Bolster, PA    12/10/2023 1:30 PM Lisa Herrera  MRN:  [id number]  Chief Complaint:  Chief Complaint  Patient presents with   Follow-up   Medication Refill   HPI:   Lisa Herrera is a 28 year old, Hispanic female with a past psychiatric history significant for generalized anxiety disorder, PTSD, and major depressive disorder who presents to [facility] via virtual video visit for follow-up and medication management.  Patient was last seen by this provider on 08/07/2023.  During her last encounter, patient was being managed on the following psychiatric medications:  Sertraline  (Zoloft ) 100 mg daily Buspirone  7.5 mg 2 times daily  Patient reports that she last took her medications 2 days ago due to losing her prescriptions.  She reports that the medications were helpful in managing her symptoms.  Since being without her medications, patient endorses worsening depression and rates her depression a 5 out of 10  with 10 being most severe.  Patient endorses ongoing depressive episodes for the last 2 days since being without her medications.  Patient endorses the following depressive symptoms: decreased concentration, decreased energy, and irritability.  Patient denies feelings of sadness, lack of motivation, guilt/worthlessness, or hopelessness.  Patient endorses anxiety and rates her anxiety a 6 out of 10.  Patient is uncertain of triggers that could be contributing to her depression/anxiety.  A PHQ-9 screen was performed with the patient scoring an 11.  A GAD-7 screen was also performed with the patient scoring a 7.  Patient is alert and oriented x 4, calm, cooperative, and fully engaged in conversation during the encounter.  Patient describes her mood as sad, anxious, and angry.  Patient exhibits depressed mood with congruent affect.  Patient denies suicidal or homicidal ideations.  She further denies auditory or visual hallucinations and does not appear to be responding to internal/external stimuli.  Patient endorses fair sleep and receives on average 6 to 7 hours of sleep per night.  Patient endorses good appetite and eats on average 3-4 meals per day.  Patient denies alcohol consumption, tobacco use, or illicit drug use.  Visit Diagnosis:    ICD-10-CM   1. Generalized anxiety disorder  F41.1 sertraline  (ZOLOFT ) 100 MG tablet    busPIRone  (BUSPAR ) 7.5 MG tablet    2. PTSD (post-traumatic stress disorder)  F43.10 sertraline  (ZOLOFT ) 100 MG tablet    3. Mild episode of recurrent major depressive disorder (HCC)  F33.0 sertraline  (ZOLOFT ) 100 MG tablet      Past Psychiatric History:  Major depressive disorder Generalized anxiety disorder PTSD  Past Medical  History:  Past Medical History:  Diagnosis Date   Alpha thalassemia silent carrier 05/29/2019   Gallstones    Hepatic steatosis 02/04/2020   Migraine    Umbilical hernia without obstruction and without gangrene 02/04/2020    Past Surgical  History:  Procedure Laterality Date   CESAREAN SECTION N/A 01/14/2016   Procedure: CESAREAN SECTION;  Surgeon: Gloris DELENA Hugger, MD;  Location: [facility];  Service: Obstetrics;  Laterality: N/A;   CESAREAN SECTION N/A 11/17/2019   Procedure: CESAREAN SECTION;  Surgeon: Barbra Lang PARAS, DO;  Location: [facility];  Service: Obstetrics;  Laterality: N/A;   CHOLECYSTECTOMY     TUBAL LIGATION Bilateral 11/17/2019   Procedure: BILATERAL TUBAL LIGATION;  Surgeon: Barbra Lang PARAS, DO;  Location: [facility] LD [facility];  Service: Obstetrics;  Laterality: Bilateral;    Family Psychiatric History:  Grandfather - patient states that her grandfather may have been schizophrenic but he was never officially diagnosed   Brother - patient states that her brother has been assessed for schizophrenia but has never been diagnosed  Family History:  Family History  Problem Relation Age of Onset   Breast cancer Maternal Aunt        50's   Colon cancer Neg Hx    Stomach cancer Neg Hx    Esophageal cancer Neg Hx     Social History:  Social History   Socioeconomic History   Marital status: Married    Spouse name: Not on file   Number of children: 1   Years of education: Not on file   Highest education level: Not on file  Occupational History   Not on file  Tobacco Use   Smoking status: Never   Smokeless tobacco: Never  Vaping Use   Vaping status: Never Used  Substance and Sexual Activity   Alcohol use: No    Alcohol/week: 0.0 standard drinks of alcohol   Drug use: No   Sexual activity: Yes    Birth control/protection: None  Other Topics Concern   Not on file  Social History Narrative   Married, originally from [place], Spanish-speaking.   Son born October 2021 and a daughter born in 2017   No alcohol no tobacco or drug use   Social Drivers of Corporate investment banker Strain: Not on file  Food Insecurity: Unknown (05/30/2023)   Received from [facility]   Hunger Vital Sign    Within the  past 12 months, you worried that your food would run out before you got money to buy more: Patient declined to answer    Within the past 12 months, the food you bought just didn't last and you didn't have money to get more. : Patient declined to answer  Transportation Needs: No Transportation Needs (05/30/2023)   Received from [facility]    In the past 12 months, has lack of reliable transportation kept you from medical appointments, meetings, work or from getting things needed for daily living? : No  Physical Activity: Not on file  Stress: Not on file  Social Connections: Not on file    Allergies:  Allergies  Allergen Reactions   Bupropion     Anxiousness Tachycardia    Metabolic Disorder Labs: Lab Results  Component Value Date   HGBA1C 5.3 05/25/2020   MPG 105.41 05/25/2020   No results found for: PROLACTIN Lab Results  Component Value Date   CHOL 187 05/25/2020   TRIG 111 05/25/2020   HDL 55 05/25/2020  CHOLHDL 3.4 05/25/2020   VLDL 22 05/25/2020   LDLCALC 110 (H) 05/25/2020   Lab Results  Component Value Date   TSH 1.836 05/25/2020    Therapeutic Level Labs: No results found for: LITHIUM No results found for: VALPROATE No results found for: CBMZ  Current Medications: Current Outpatient Medications  Medication Sig Dispense Refill   busPIRone  (BUSPAR ) 7.5 MG tablet Take 1 tablet (7.5 mg total) by mouth 2 (two) times daily. 180 tablet 0   hydrOXYzine  (ATARAX /VISTARIL ) 25 MG tablet Take 1 tablet (25 mg total) by mouth every 6 (six) hours as needed for anxiety. 75 tablet 0   omeprazole  (PRILOSEC) 40 MG capsule Take 1 capsule (40 mg total) by mouth daily before breakfast. 30 capsule 2   oseltamivir  (TAMIFLU ) 75 MG capsule Take 1 capsule (75 mg total) by mouth every 12 (twelve) hours. 10 capsule 0   Prenatal Vit-Fe Fumarate-FA (PRENATAL MULTIVITAMIN) TABS tablet Take 1 tablet by mouth daily.      sertraline  (ZOLOFT ) 100 MG tablet Take 1  tablet (100 mg total) by mouth daily. TAKE 1 TABLET(50 MG) BY MOUTH DAILY 90 tablet 0   traZODone  (DESYREL ) 50 MG tablet Take 1 tablet (50 mg total) by mouth at bedtime. For sleep 30 tablet 0   No current facility-administered medications for this visit.     Musculoskeletal: Strength & Muscle Tone: within normal limits Gait & Station: normal Patient leans: N/A  Psychiatric Specialty Exam: Review of Systems  Psychiatric/Behavioral:  Positive for dysphoric mood and sleep disturbance. Negative for decreased concentration, hallucinations, self-injury and suicidal ideas. The patient is nervous/anxious. The patient is not hyperactive.     There were no vitals taken for this visit.There is no height or weight on file to calculate BMI.  General Appearance: Casual  Eye Contact:  Good  Speech:  Clear and Coherent and Normal Rate  Volume:  Normal  Mood:  Anxious and Depressed  Affect:  Congruent  Thought Process:  Coherent, Goal Directed, and Descriptions of Associations: Intact  Orientation:  Full (Time, Place, and Person)  Thought Content: WDL   Suicidal Thoughts:  No  Homicidal Thoughts:  No  Memory:  Immediate;   Good Recent;   Good Remote;   Good  Judgement:  Good  Insight:  Good  Psychomotor Activity:  Normal  Concentration:  Concentration: Good and Attention Span: Good  Recall:  Good  Fund of Knowledge: Good  Language: Good  Akathisia:  No  Handed:  Right  AIMS (if indicated): not done  Assets:  Communication Skills Desire for Improvement Housing Intimacy Social Support Vocational/Educational  ADL's:  Intact  Cognition: WNL  Sleep:  Fair   Screenings: AIMS    Flowsheet Row [facility]  AIMS Total Score 0   AUDIT    Flowsheet Row [facility]  Alcohol Use Disorder Identification Test Final Score (AUDIT) 0   GAD-7     Flowsheet Row Video Visit from 12/10/2023 in [facility] Video Visit from 08/07/2023 in [facility] Video Visit from 06/04/2023 in [facility] Video Visit from 04/09/2023 in [facility] Video Visit from 01/09/2023 in [facility]  Total GAD-7 Score 7 6 10 10 10    PHQ2-9    Flowsheet Row Video Visit from 12/10/2023 in [facility] Video Visit from 08/07/2023  in [facility] Video Visit from 06/04/2023 in [facility] Video Visit from 04/09/2023 in [facility] Video Visit from 01/09/2023 in [facility]  PHQ-2 Total Score 2 2 0 2 3  PHQ-9 Total Score 11 6 -- 8 8   Flowsheet Row Video Visit from 12/10/2023 [facility] Video Visit from 08/07/2023 in [facility] Video Visit from 06/04/2023 in [facility]  C-SSRS RISK CATEGORY Low Risk No Risk No Risk     Assessment and Plan:   Lisa Herrera is a 28 year old, Hispanic female with a past psychiatric history significant for generalized anxiety disorder, PTSD, and major depressive disorder who presents to [facility] via virtual video visit for follow-up and medication management.  Patient presents to encounter stating that she has been experiencing ongoing depression and anxiety since losing her prescriptions.  Patient reports that she has been without her prescriptions for the last 2 days.  A PHQ-9 screening was performed with the patient scored an 11.  A GAD-7 screen was also performed with the patient scoring a 7.    Patient request to be placed back on her previously prescribed medications.  Patient to be placed back on sertraline  100 mg daily for the  management of her depressive symptoms and anxiety.  Patient to be placed back on buspirone  7.5 mg 2 times daily for the management of her anxiety.  Patient's medications to be e-prescribed to pharmacy of choice.  A [place] Suicide Severity Rating Scale was performed with the patient being considered low risk.  Patient denies suicidal ideations and is able to contract for safety at this time.   Collaboration of Care: Collaboration of Care: Medication Management AEB provider managing patient's psychiatric medications, Primary Care Provider AEB patient being seen by primary care provider, Psychiatrist AEB patient being followed by mental health provider, and Other provider involved in patient's care AEB patient being followed by bariatrics  Patient/Guardian was advised Release of Information must be obtained prior to any record release in order to collaborate their care with an outside provider. Patient/Guardian was advised if they have not already done so to contact the registration department to sign all necessary forms in order for us  to release information regarding their care.   Consent: Patient/Guardian gives verbal consent for treatment and assignment of benefits for services provided during this visit. Patient/Guardian expressed understanding and agreed to proceed.   1. Generalized anxiety disorder  - sertraline  (ZOLOFT ) 100 MG tablet; Take 1 tablet (100 mg total) by mouth daily. TAKE 1 TABLET(50 MG) BY MOUTH DAILY  Dispense: 90 tablet; Refill: 0 - busPIRone  (BUSPAR ) 7.5 MG tablet; Take 1 tablet (7.5 mg total) by mouth 2 (two) times daily.  Dispense: 180 tablet; Refill: 0  2. PTSD (post-traumatic stress disorder)  - sertraline  (ZOLOFT ) 100 MG tablet; Take 1 tablet (100 mg total) by mouth daily. TAKE 1 TABLET(50 MG) BY MOUTH DAILY  Dispense: 90 tablet; Refill: 0  3. Mild episode of recurrent major depressive disorder (HCC)  - sertraline  (ZOLOFT ) 100 MG tablet; Take 1 tablet (100 mg total)  by mouth daily. TAKE 1 TABLET(50 MG) BY MOUTH DAILY  Dispense: 90 tablet; Refill: 0  Patient to follow up in 7 weeks Provider spent a total of 13 minutes with the patient/reviewing patient's chart  Reginia FORBES Bolster, PA 12/10/2023, 1:30 PM

## 2024-01-31 ENCOUNTER — Telehealth (INDEPENDENT_AMBULATORY_CARE_PROVIDER_SITE_OTHER): Admitting: Physician Assistant

## 2024-01-31 DIAGNOSIS — F411 Generalized anxiety disorder: Secondary | ICD-10-CM

## 2024-01-31 DIAGNOSIS — F330 Major depressive disorder, recurrent, mild: Secondary | ICD-10-CM | POA: Diagnosis not present

## 2024-01-31 DIAGNOSIS — F4310 Post-traumatic stress disorder, unspecified: Secondary | ICD-10-CM | POA: Diagnosis not present

## 2024-02-03 ENCOUNTER — Encounter (HOSPITAL_COMMUNITY): Payer: Self-pay | Admitting: Physician Assistant

## 2024-02-03 MED ORDER — SERTRALINE HCL 100 MG PO TABS: 100.0000 mg | ORAL_TABLET | Freq: Every day | ORAL | 0 refills | Status: DC

## 2024-02-03 MED ORDER — BUSPIRONE HCL 7.5 MG PO TABS: 7.5000 mg | ORAL_TABLET | Freq: Two times a day (BID) | ORAL | 0 refills | Status: DC

## 2024-02-03 NOTE — Progress Notes (Signed)
 [facility] MD/PA/NP OP Progress Note  Virtual Visit via Video Note  I connected with Lisa Herrera on 01/31/24 at 10:00 AM EST by a video enabled telemedicine application and verified that I am speaking with the correct person using two identifiers.  Location: Patient: Home Provider: Clinic   I discussed the limitations of evaluation and management by telemedicine and the availability of in person appointments. The patient expressed understanding and agreed to proceed.  Follow Up Instructions:   I discussed the assessment and treatment plan with the patient. The patient was provided an opportunity to ask questions and all were answered. The patient agreed with the plan and demonstrated an understanding of the instructions.   The patient was advised to call back or seek an in-person evaluation if the symptoms worsen or if the condition fails to improve as anticipated.  I provided 12 minutes of non-face-to-face time during this encounter.  Reginia FORBES Bolster, PA    01/31/2024 10:00 AM Eddith Mentor  MRN:  [id number]  Chief Complaint:  Chief Complaint  Patient presents with   Follow-up   Medication Refill   HPI:   Lisa Herrera is a 28 year old, Hispanic female with a past psychiatric history significant for generalized anxiety disorder, PTSD, and major depressive disorder who presents to [facility] via virtual video visit for follow-up and medication management. Patient is currently being managed on the following psychiatric medications:  Sertraline  (Zoloft ) 100 mg daily Buspirone  7.5 mg 2 times daily  Patient presents to the encounter stating that she has been taking her medications regularly and denies experiencing any adverse side effects.  Patient denies overt depressive symptoms nor does she endorse anxiety.  Patient denies any new stressors at this time.  Patient inquires about getting her dog to be an emotional support animal.  She reports that  she has a French bulldog that is 20 to 30 pounds and is interested in having it be an emotional support animal.  A PHQ-9 screen was performed with the patient scoring a 6.  A GAD-7 screen was also performed with the patient scoring a 7.  Patient is alert and oriented x 4, calm, cooperative, and fully engaged in conversation during the encounter.  Patient endorses good mood but states that she occasionally feels sad due to problems between her and her husband.  Patient exhibits euthymic mood with appropriate affect.  Patient denies suicidal or homicidal ideations.  She further denies auditory or visual hallucinations and does not appear to be responding to internal/external stimuli.  Patient endorses good sleep and receives on average 7 to 8 hours of sleep per night.  Patient endorses good appetite and eats on average 4 meals per day.  Patient denies alcohol consumption, tobacco use, or illicit drug use.  Visit Diagnosis:    ICD-10-CM   1. Generalized anxiety disorder  F41.1 sertraline  (ZOLOFT ) 100 MG tablet    busPIRone  (BUSPAR ) 7.5 MG tablet    2. PTSD (post-traumatic stress disorder)  F43.10 sertraline  (ZOLOFT ) 100 MG tablet    3. Mild episode of recurrent major depressive disorder  F33.0 sertraline  (ZOLOFT ) 100 MG tablet      Past Psychiatric History:  Major depressive disorder Generalized anxiety disorder PTSD  Past Medical History:  Past Medical History:  Diagnosis Date   Alpha thalassemia silent carrier 05/29/2019   Gallstones    Hepatic steatosis 02/04/2020   Migraine    Umbilical hernia without obstruction and without gangrene 02/04/2020    Past Surgical History:  Procedure Laterality Date   CESAREAN SECTION N/A 01/14/2016   Procedure: CESAREAN SECTION;  Surgeon: Gloris DELENA Hugger, MD;  Location: [facility];  Service: Obstetrics;  Laterality: N/A;   CESAREAN SECTION N/A 11/17/2019   Procedure: CESAREAN SECTION;  Surgeon: Barbra Lang PARAS, DO;  Location: [facility];   Service: Obstetrics;  Laterality: N/A;   CHOLECYSTECTOMY     TUBAL LIGATION Bilateral 11/17/2019   Procedure: BILATERAL TUBAL LIGATION;  Surgeon: Barbra Lang PARAS, DO;  Location: [facility];  Service: Obstetrics;  Laterality: Bilateral;    Family Psychiatric History:  Grandfather - patient states that her grandfather may have been schizophrenic but he was never officially diagnosed   Brother - patient states that her brother has been assessed for schizophrenia but has never been diagnosed  Family History:  Family History  Problem Relation Age of Onset   Breast cancer Maternal Aunt        50's   Colon cancer Neg Hx    Stomach cancer Neg Hx    Esophageal cancer Neg Hx     Social History:  Social History   Socioeconomic History   Marital status: Married    Spouse name: Not on file   Number of children: 1   Years of education: Not on file   Highest education level: Not on file  Occupational History   Not on file  Tobacco Use   Smoking status: Never   Smokeless tobacco: Never  Vaping Use   Vaping status: Never Used  Substance and Sexual Activity   Alcohol use: No    Alcohol/week: 0.0 standard drinks of alcohol   Drug use: No   Sexual activity: Yes    Birth control/protection: None  Other Topics Concern   Not on file  Social History Narrative   Married, originally from [place], Spanish-speaking.   Son born October 2021 and a daughter born in 2017   No alcohol no tobacco or drug use   Social Drivers of Corporate Investment Banker Strain: Not on file  Food Insecurity: Unknown (05/30/2023)   Received from [facility]   Hunger Vital Sign    Within the past 12 months, you worried that your food would run out before you got money to buy more: Patient declined to answer    Within the past 12 months, the food you bought just didn't last and you didn't have money to get more. : Patient declined to answer  Transportation Needs: No Transportation Needs (05/30/2023)   Received  from [facility]    In the past 12 months, has lack of reliable transportation kept you from medical appointments, meetings, work or from getting things needed for daily living? : No  Physical Activity: Not on file  Stress: Not on file  Social Connections: Not on file    Allergies:  Allergies  Allergen Reactions   Bupropion     Anxiousness Tachycardia    Metabolic Disorder Labs: Lab Results  Component Value Date   HGBA1C 5.3 05/25/2020   MPG 105.41 05/25/2020   No results found for: PROLACTIN Lab Results  Component Value Date   CHOL 187 05/25/2020   TRIG 111 05/25/2020   HDL 55 05/25/2020   CHOLHDL 3.4 05/25/2020   VLDL 22 05/25/2020   LDLCALC 110 (H) 05/25/2020   Lab Results  Component Value Date   TSH 1.836 05/25/2020    Therapeutic Level Labs: No results found for: LITHIUM No results found for: VALPROATE No  results found for: CBMZ  Current Medications: Current Outpatient Medications  Medication Sig Dispense Refill   busPIRone  (BUSPAR ) 7.5 MG tablet Take 1 tablet (7.5 mg total) by mouth 2 (two) times daily. 180 tablet 0   hydrOXYzine  (ATARAX /VISTARIL ) 25 MG tablet Take 1 tablet (25 mg total) by mouth every 6 (six) hours as needed for anxiety. 75 tablet 0   omeprazole  (PRILOSEC) 40 MG capsule Take 1 capsule (40 mg total) by mouth daily before breakfast. 30 capsule 2   oseltamivir  (TAMIFLU ) 75 MG capsule Take 1 capsule (75 mg total) by mouth every 12 (twelve) hours. 10 capsule 0   Prenatal Vit-Fe Fumarate-FA (PRENATAL MULTIVITAMIN) TABS tablet Take 1 tablet by mouth daily.      sertraline  (ZOLOFT ) 100 MG tablet Take 1 tablet (100 mg total) by mouth daily. TAKE 1 TABLET(50 MG) BY MOUTH DAILY 90 tablet 0   traZODone  (DESYREL ) 50 MG tablet Take 1 tablet (50 mg total) by mouth at bedtime. For sleep 30 tablet 0   No current facility-administered medications for this visit.     Musculoskeletal: Strength & Muscle Tone: within normal  limits Gait & Station: normal Patient leans: N/A  Psychiatric Specialty Exam: Review of Systems  Psychiatric/Behavioral:  Positive for dysphoric mood and sleep disturbance. Negative for decreased concentration, hallucinations, self-injury and suicidal ideas. The patient is nervous/anxious. The patient is not hyperactive.     There were no vitals taken for this visit.There is no height or weight on file to calculate BMI.  General Appearance: Casual  Eye Contact:  Good  Speech:  Clear and Coherent and Normal Rate  Volume:  Normal  Mood:  Anxious and Depressed  Affect:  Congruent  Thought Process:  Coherent, Goal Directed, and Descriptions of Associations: Intact  Orientation:  Full (Time, Place, and Person)  Thought Content: WDL   Suicidal Thoughts:  No  Homicidal Thoughts:  No  Memory:  Immediate;   Good Recent;   Good Remote;   Good  Judgement:  Good  Insight:  Good  Psychomotor Activity:  Normal  Concentration:  Concentration: Good and Attention Span: Good  Recall:  Good  Fund of Knowledge: Good  Language: Good  Akathisia:  No  Handed:  Right  AIMS (if indicated): not done  Assets:  Communication Skills Desire for Improvement Housing Intimacy Social Support Vocational/Educational  ADL's:  Intact  Cognition: WNL  Sleep:  Fair   Screenings: AIMS    Flowsheet Row [facility]  AIMS Total Score 0   AUDIT    Flowsheet Row [facility]  Alcohol Use Disorder Identification Test Final Score (AUDIT) 0   GAD-7    Flowsheet Row Video Visit from 01/31/2024 in [facility] Video Visit from 12/10/2023 in [facility] Video Visit from 08/07/2023 in [facility] Video Visit from 06/04/2023 in [facility] Video Visit from  04/09/2023 in [facility]  Total GAD-7 Score 7 7 6 10 10    PHQ2-9    Flowsheet Row Video Visit from 01/31/2024 in [facility] Video Visit from 12/10/2023 in [facility] Video Visit from 08/07/2023 in [facility] Video Visit from 06/04/2023 in [facility] Video Visit from 04/09/2023 in [facility]  PHQ-2 Total Score 2 2 2  0  2  PHQ-9 Total Score 6 11 6  -- 8   Flowsheet Row Video Visit from 01/31/2024 in [facility] Video Visit from 12/10/2023 in [facility] Video Visit from 08/07/2023 in [facility]  C-SSRS RISK CATEGORY No Risk Low Risk No Risk     Assessment and Plan:   Lisa Herrera is a 28 year old, Hispanic female with a past psychiatric history significant for generalized anxiety disorder, PTSD, and major depressive disorder who presents to [facility] via virtual video visit for follow-up and medication management.  Patient presents to the encounter stating that she continues to take her medications regularly and denies experiencing any adverse side effects.  Patient denies overt depressive symptoms nor does she endorse anxiety.  A PHQ-9 screen was performed with the patient scoring a 6.  A GAD-7 screen was also performed with the patient scoring a 7.  Patient endorses stability on her current medication regimen and denies the need for dosage adjustments at this time.  Patient would like to continue taking her medications as prescribed.  Patient's medications to be e-prescribed to pharmacy of choice.  Prior to the conclusion of the encounter, patient inquired about her pet dog becoming an emotional support animal.  She reports that she currently has a French bulldog that weighs 20 to 30 pounds.  Provider  will determine if patient's pet meets qualifications to be an emotional support animal.  A [place] Suicide Severity Rating Scale was performed with the patient being considered low risk.  Patient denies suicidal ideations and is able to contract for safety at this time.   Collaboration of Care: Collaboration of Care: Medication Management AEB provider managing patient's psychiatric medications, Primary Care Provider AEB patient being seen by primary care provider, Psychiatrist AEB patient being followed by mental health provider, and Other provider involved in patient's care AEB patient being followed by bariatrics  Patient/Guardian was advised Release of Information must be obtained prior to any record release in order to collaborate their care with an outside provider. Patient/Guardian was advised if they have not already done so to contact the registration department to sign all necessary forms in order for us  to release information regarding their care.   Consent: Patient/Guardian gives verbal consent for treatment and assignment of benefits for services provided during this visit. Patient/Guardian expressed understanding and agreed to proceed.   1. Generalized anxiety disorder  - sertraline  (ZOLOFT ) 100 MG tablet; Take 1 tablet (100 mg total) by mouth daily. TAKE 1 TABLET(50 MG) BY MOUTH DAILY  Dispense: 90 tablet; Refill: 0 - busPIRone  (BUSPAR ) 7.5 MG tablet; Take 1 tablet (7.5 mg total) by mouth 2 (two) times daily.  Dispense: 180 tablet; Refill: 0  2. PTSD (post-traumatic stress disorder)  - sertraline  (ZOLOFT ) 100 MG tablet; Take 1 tablet (100 mg total) by mouth daily. TAKE 1 TABLET(50 MG) BY MOUTH DAILY  Dispense: 90 tablet; Refill: 0  3. Mild episode of recurrent major depressive disorder  - sertraline  (ZOLOFT ) 100 MG tablet; Take 1 tablet (100 mg total) by mouth daily. TAKE 1 TABLET(50 MG) BY MOUTH DAILY  Dispense: 90 tablet; Refill: 0  Patient to follow up in 2 months Provider  spent a total of 12 minutes with the patient/reviewing patient's chart  Reginia FORBES Bolster, PA 01/31/2024, 10:00 AM

## 2024-04-03 ENCOUNTER — Telehealth (HOSPITAL_COMMUNITY): Admitting: Physician Assistant

## 2024-04-08 ENCOUNTER — Encounter (HOSPITAL_COMMUNITY): Payer: Self-pay | Admitting: Physician Assistant

## 2024-04-08 ENCOUNTER — Telehealth (HOSPITAL_COMMUNITY): Admitting: Physician Assistant

## 2024-04-08 DIAGNOSIS — F330 Major depressive disorder, recurrent, mild: Secondary | ICD-10-CM

## 2024-04-08 DIAGNOSIS — F4310 Post-traumatic stress disorder, unspecified: Secondary | ICD-10-CM | POA: Diagnosis not present

## 2024-04-08 DIAGNOSIS — F411 Generalized anxiety disorder: Secondary | ICD-10-CM | POA: Diagnosis not present

## 2024-04-08 MED ORDER — SERTRALINE HCL 100 MG PO TABS: 100.0000 mg | ORAL_TABLET | Freq: Every day | ORAL | 0 refills | Status: AC

## 2024-04-08 MED ORDER — BUSPIRONE HCL 7.5 MG PO TABS: 7.5000 mg | ORAL_TABLET | Freq: Two times a day (BID) | ORAL | 0 refills | Status: AC

## 2024-04-08 NOTE — Progress Notes (Signed)
 [facility] MD/PA/NP OP Progress Note  Virtual Visit via Video Note  I connected with Lisa Herrera on 04/08/24 at 11:30 AM EST by a video enabled telemedicine application and verified that I am speaking with the correct person using two identifiers.  Location: Patient: Home Provider: Clinic   I discussed the limitations of evaluation and management by telemedicine and the availability of in person appointments. The patient expressed understanding and agreed to proceed.  Follow Up Instructions:   I discussed the assessment and treatment plan with the patient. The patient was provided an opportunity to ask questions and all were answered. The patient agreed with the plan and demonstrated an understanding of the instructions.   The patient was advised to call back or seek an in-person evaluation if the symptoms worsen or if the condition fails to improve as anticipated.  I provided 9 minutes of non-face-to-face time during this encounter.  Lisa FORBES Bolster, PA    04/08/2024 11:30 AM Lisa Herrera  MRN:  [id number]  Chief Complaint:  Chief Complaint  Patient presents with   Follow-up   Medication Refill   HPI:   Lisa Herrera is a 29 year old, Hispanic female with a past psychiatric history significant for generalized anxiety disorder, PTSD, and major depressive disorder who presents to [facility] via virtual video visit for follow-up and medication management. Patient is currently being managed on the following psychiatric medications:  Sertraline  (Zoloft ) 100 mg daily Buspirone  7.5 mg 2 times daily  Patient presents to the encounter stating that she has been taking her medications regularly and denies experiencing any adverse side effects.  Patient denies overt depressive symptoms but does endorse anxiety.  She rates her anxiety a 4 out of 10 but is unable to determine any contributing factors to her anxiety.  Patient denies any new stressors at this  time.  A PHQ-9 screen was performed with the patient scoring a 5.  A GAD-7 screen was also performed with the patient scoring a 3.  Patient is alert and oriented x 4, calm, cooperative, and fully engaged in conversation during the encounter.  Patient endorses nervousness due to her period starting.  Patient exhibits euthymic mood with appropriate affect.  Patient denies suicidal or homicidal ideations.  She further denies auditory or visual hallucinations and does not appear to be responding to internal/external stimuli.  Patient endorses fair sleep and receives on average 6 to 7 hours of sleep per night.  Patient endorses good appetite and eats on average 3 meals per day.  Patient denies alcohol consumption, tobacco use, or illicit drug use.  Visit Diagnosis:    ICD-10-CM   1. Generalized anxiety disorder  F41.1 sertraline  (ZOLOFT ) 100 MG tablet    busPIRone  (BUSPAR ) 7.5 MG tablet    2. PTSD (post-traumatic stress disorder)  F43.10 sertraline  (ZOLOFT ) 100 MG tablet    3. Mild episode of recurrent major depressive disorder  F33.0 sertraline  (ZOLOFT ) 100 MG tablet      Past Psychiatric History:  Major depressive disorder Generalized anxiety disorder PTSD  Past Medical History:  Past Medical History:  Diagnosis Date   Alpha thalassemia silent carrier 05/29/2019   Gallstones    Hepatic steatosis 02/04/2020   Migraine    Umbilical hernia without obstruction and without gangrene 02/04/2020    Past Surgical History:  Procedure Laterality Date   CESAREAN SECTION N/A 01/14/2016   Procedure: CESAREAN SECTION;  Surgeon: Lisa DELENA Hugger, MD;  Location: [facility];  Service: Obstetrics;  Laterality:  N/A;   CESAREAN SECTION N/A 11/17/2019   Procedure: CESAREAN SECTION;  Surgeon: Lisa Lang PARAS, DO;  Location: [facility];  Service: Obstetrics;  Laterality: N/A;   CHOLECYSTECTOMY     TUBAL LIGATION Bilateral 11/17/2019   Procedure: BILATERAL TUBAL LIGATION;  Surgeon: Lisa Lang PARAS, DO;   Location: [facility];  Service: Obstetrics;  Laterality: Bilateral;    Family Psychiatric History:  Grandfather - patient states that her grandfather may have been schizophrenic but he was never officially diagnosed   Brother - patient states that her brother has been assessed for schizophrenia but has never been diagnosed  Family History:  Family History  Problem Relation Age of Onset   Breast cancer Maternal Aunt        50's   Colon cancer Neg Hx    Stomach cancer Neg Hx    Esophageal cancer Neg Hx     Social History:  Social History   Socioeconomic History   Marital status: Married    Spouse name: Not on file   Number of children: 1   Years of education: Not on file   Highest education level: Not on file  Occupational History   Not on file  Tobacco Use   Smoking status: Never   Smokeless tobacco: Never  Vaping Use   Vaping status: Never Used  Substance and Sexual Activity   Alcohol use: No    Alcohol/week: 0.0 standard drinks of alcohol   Drug use: No   Sexual activity: Yes    Birth control/protection: None  Other Topics Concern   Not on file  Social History Narrative   Married, originally from [place], Spanish-speaking.   Son born October 2021 and a daughter born in 2017   No alcohol no tobacco or drug use   Social Drivers of Health   Tobacco Use: Low Risk (04/08/2024)   Patient History    Smoking Tobacco Use: Never    Smokeless Tobacco Use: Never    Passive Exposure: Not on file  Financial Resource Strain: Not on file  Food Insecurity: Unknown (05/30/2023)   Received from [facility]   Epic    Within the past 12 months, you worried that your food would run out before you got money to buy more: Patient declined to answer    Within the past 12 months, the food you bought just didn't last and you didn't have money to get more. : Patient declined to answer  Transportation Needs: No Transportation Needs (05/30/2023)   Received from [employer]    In the past 12 months, has lack of reliable transportation kept you from medical appointments, meetings, work or from getting things needed for daily living? : No  Physical Activity: Not on file  Stress: Not on file  Social Connections: Not on file  Depression (PHQ2-9): Medium Risk (04/08/2024)   Depression (PHQ2-9)    PHQ-2 Score: 5  Alcohol Screen: Not on file  Housing: Low Risk (05/30/2023)   Received from [facility]   Epic    What is your living situation today?: I have a steady place to live    Think about the place you live. Do you have problems with any of the following? Choose all that apply:: Pt declined to answer  Utilities: Low Risk (05/30/2023)   Received from [facility]   Utilities    In the past 12 months has the electric, gas, oil, or water  company threatened to shut off services in  your home? : No  Health Literacy: Not on file    Allergies:  Allergies  Allergen Reactions   Bupropion     Anxiousness Tachycardia    Metabolic Disorder Labs: Lab Results  Component Value Date   HGBA1C 5.3 05/25/2020   MPG 105.41 05/25/2020   No results found for: PROLACTIN Lab Results  Component Value Date   CHOL 187 05/25/2020   TRIG 111 05/25/2020   HDL 55 05/25/2020   CHOLHDL 3.4 05/25/2020   VLDL 22 05/25/2020   LDLCALC 110 (H) 05/25/2020   Lab Results  Component Value Date   TSH 1.836 05/25/2020    Therapeutic Level Labs: No results found for: LITHIUM No results found for: VALPROATE No results found for: CBMZ  Current Medications: Current Outpatient Medications  Medication Sig Dispense Refill   busPIRone  (BUSPAR ) 7.5 MG tablet Take 1 tablet (7.5 mg total) by mouth 2 (two) times daily. 180 tablet 0   hydrOXYzine  (ATARAX /VISTARIL ) 25 MG tablet Take 1 tablet (25 mg total) by mouth every 6 (six) hours as needed for anxiety. 75 tablet 0   omeprazole  (PRILOSEC) 40 MG capsule Take 1 capsule (40 mg total) by mouth daily before breakfast.  30 capsule 2   oseltamivir  (TAMIFLU ) 75 MG capsule Take 1 capsule (75 mg total) by mouth every 12 (twelve) hours. 10 capsule 0   Prenatal Vit-Fe Fumarate-FA (PRENATAL MULTIVITAMIN) TABS tablet Take 1 tablet by mouth daily.      sertraline  (ZOLOFT ) 100 MG tablet Take 1 tablet (100 mg total) by mouth daily. TAKE 1 TABLET(50 MG) BY MOUTH DAILY 90 tablet 0   traZODone  (DESYREL ) 50 MG tablet Take 1 tablet (50 mg total) by mouth at bedtime. For sleep 30 tablet 0   No current facility-administered medications for this visit.     Musculoskeletal: Strength & Muscle Tone: within normal limits Gait & Station: normal Patient leans: N/A  Psychiatric Specialty Exam: Review of Systems  Psychiatric/Behavioral:  Positive for dysphoric mood and sleep disturbance. Negative for decreased concentration, hallucinations, self-injury and suicidal ideas. The patient is nervous/anxious. The patient is not hyperactive.     There were no vitals taken for this visit.There is no height or weight on file to calculate BMI.  General Appearance: Casual  Eye Contact:  Good  Speech:  Clear and Coherent and Normal Rate  Volume:  Normal  Mood:  Anxious and Depressed  Affect:  Congruent  Thought Process:  Coherent, Goal Directed, and Descriptions of Associations: Intact  Orientation:  Full (Time, Place, and Person)  Thought Content: WDL   Suicidal Thoughts:  No  Homicidal Thoughts:  No  Memory:  Immediate;   Good Recent;   Good Remote;   Good  Judgement:  Good  Insight:  Good  Psychomotor Activity:  Normal  Concentration:  Concentration: Good and Attention Span: Good  Recall:  Good  Fund of Knowledge: Good  Language: Good  Akathisia:  No  Handed:  Right  AIMS (if indicated): not done  Assets:  Communication Skills Desire for Improvement Housing Intimacy Social Support Vocational/Educational  ADL's:  Intact  Cognition: WNL  Sleep:  Fair   Screenings: AIMS    Flowsheet Row [facility]  AIMS Total Score 0   AUDIT    Flowsheet Row [facility]  Alcohol Use Disorder Identification Test Final Score (AUDIT) 0   GAD-7    Flowsheet Row Video  Visit from 04/08/2024 in [facility] Video Visit from 01/31/2024 in [facility] Video Visit from 12/10/2023 in [facility] Video Visit from 08[facility]07/2023 in [facility] Video Visit from 06/04/2023 in [facility]  Total GAD-7 Score 3 7 7 6 10    PHQ2-9    Flowsheet Row Video Visit from 04/08/2024 in [facility] Video Visit from 01/31/2024 in [facility] Video Visit from 12/10/2023 in [facility] Video Visit from 08/07/2023 in [facility] Video Visit from 06/04/2023 in [facility]  PHQ-2 Total Score 3 2 2 2  0  PHQ-9 Total Score 5 6 11 6  --   Flowsheet Row Video Visit from 04/08/2024 in [facility] Video Visit from 01/31/2024 in [facility] Video Visit from 12/10/2023 in [facility]  C-SSRS RISK CATEGORY No Risk No Risk Low Risk     Assessment and Plan:   Keller Mikels is a 29 year old, Hispanic female with a past psychiatric history significant for generalized anxiety disorder, PTSD, and major depressive disorder who presents to [facility] via virtual video visit for follow-up and medication management.  Patient presents to the encounter stating that she has been taking her medications regularly and denies experiencing any adverse side effects.  Patient denies overt depressive symptoms but states that she has been  experiencing anxiety with no discernible triggers.  A PHQ-9 screen was performed with the patient scoring a 5.  A GAD-7 screening was also performed with the patient scoring 3.  Despite her minimal anxiety, patient appears stable and denies the need for dosage adjustments at this time.  Patient to continue taking her medications as prescribed.  Patient's medications to be e-prescribed to pharmacy of choice.  A [place] Suicide Severity Rating Scale was performed with the patient being considered low risk.  Patient denies suicidal ideations and is able to contract for safety at this time.   Collaboration of Care: Collaboration of Care: Medication Management AEB provider managing patient's psychiatric medications, Primary Care Provider AEB patient being seen by primary care provider, Psychiatrist AEB patient being followed by mental health provider, and Other provider involved in patient's care AEB patient being followed by bariatrics  Patient/Guardian was advised Release of Information must be obtained prior to any record release in order to collaborate their care with an outside provider. Patient/Guardian was advised if they have not already done so to contact the registration department to sign all necessary forms in order for us  to release information regarding their care.   Consent: Patient/Guardian gives verbal consent for treatment and assignment of benefits for services provided during this visit. Patient/Guardian expressed understanding and agreed to proceed.   1. Generalized anxiety disorder  - sertraline  (ZOLOFT ) 100 MG tablet; Take 1 tablet (100 mg total) by mouth daily. TAKE 1 TABLET(50 MG) BY MOUTH DAILY  Dispense: 90 tablet; Refill: 0 - busPIRone  (BUSPAR ) 7.5 MG tablet; Take 1 tablet (7.5 mg total) by mouth 2 (two) times daily.  Dispense: 180 tablet; Refill: 0  2. PTSD (post-traumatic stress disorder)  - sertraline  (ZOLOFT ) 100 MG tablet; Take 1 tablet (100 mg total) by mouth daily.  TAKE 1 TABLET(50 MG) BY MOUTH DAILY  Dispense: 90 tablet; Refill: 0  3. Mild episode of recurrent major depressive disorder  - sertraline  (ZOLOFT ) 100 MG  tablet; Take 1 tablet (100 mg total) by mouth daily. TAKE 1 TABLET(50 MG) BY MOUTH DAILY  Dispense: 90 tablet; Refill: 0  Patient to follow up in 2 months Provider spent a total of 9 minutes with the patient/reviewing patient's chart  Lisa FORBES Bolster, PA 04/08/2024, 11:30 AM

## 2024-06-03 ENCOUNTER — Telehealth (HOSPITAL_COMMUNITY): Admitting: Physician Assistant
